# Patient Record
Sex: Female | Born: 1940 | Race: White | Hispanic: No | Smoking: Former smoker
Health system: Southern US, Community
[De-identification: ages and names within clinical notes are randomized; demographics above are authoritative.]

## PROBLEM LIST (undated history)

## (undated) DIAGNOSIS — R011 Cardiac murmur, unspecified: Secondary | ICD-10-CM

## (undated) DIAGNOSIS — I00 Rheumatic fever without heart involvement: Secondary | ICD-10-CM

## (undated) DIAGNOSIS — Z953 Presence of xenogenic heart valve: Secondary | ICD-10-CM

## (undated) DIAGNOSIS — I272 Pulmonary hypertension, unspecified: Secondary | ICD-10-CM

## (undated) DIAGNOSIS — I219 Acute myocardial infarction, unspecified: Secondary | ICD-10-CM

## (undated) DIAGNOSIS — Z8679 Personal history of other diseases of the circulatory system: Secondary | ICD-10-CM

## (undated) DIAGNOSIS — I099 Rheumatic heart disease, unspecified: Secondary | ICD-10-CM

## (undated) DIAGNOSIS — M545 Low back pain, unspecified: Secondary | ICD-10-CM

## (undated) DIAGNOSIS — I050 Rheumatic mitral stenosis: Secondary | ICD-10-CM

## (undated) DIAGNOSIS — I071 Rheumatic tricuspid insufficiency: Secondary | ICD-10-CM

## (undated) DIAGNOSIS — I509 Heart failure, unspecified: Secondary | ICD-10-CM

## (undated) DIAGNOSIS — H3530 Unspecified macular degeneration: Secondary | ICD-10-CM

## (undated) DIAGNOSIS — J449 Chronic obstructive pulmonary disease, unspecified: Secondary | ICD-10-CM

## (undated) DIAGNOSIS — I872 Venous insufficiency (chronic) (peripheral): Secondary | ICD-10-CM

## (undated) DIAGNOSIS — M1990 Unspecified osteoarthritis, unspecified site: Secondary | ICD-10-CM

## (undated) DIAGNOSIS — R4582 Worries: Secondary | ICD-10-CM

## (undated) DIAGNOSIS — I4891 Unspecified atrial fibrillation: Secondary | ICD-10-CM

## (undated) DIAGNOSIS — F419 Anxiety disorder, unspecified: Secondary | ICD-10-CM

## (undated) DIAGNOSIS — Z9889 Other specified postprocedural states: Secondary | ICD-10-CM

## (undated) DIAGNOSIS — I2510 Atherosclerotic heart disease of native coronary artery without angina pectoris: Secondary | ICD-10-CM

## (undated) DIAGNOSIS — I10 Essential (primary) hypertension: Secondary | ICD-10-CM

## (undated) DIAGNOSIS — R600 Localized edema: Secondary | ICD-10-CM

## (undated) DIAGNOSIS — N183 Chronic kidney disease, stage 3 (moderate): Secondary | ICD-10-CM

## (undated) DIAGNOSIS — I50810 Right heart failure, unspecified: Secondary | ICD-10-CM

## (undated) DIAGNOSIS — E785 Hyperlipidemia, unspecified: Secondary | ICD-10-CM

## (undated) DIAGNOSIS — E039 Hypothyroidism, unspecified: Secondary | ICD-10-CM

## (undated) DIAGNOSIS — J45909 Unspecified asthma, uncomplicated: Secondary | ICD-10-CM

## (undated) DIAGNOSIS — I5032 Chronic diastolic (congestive) heart failure: Secondary | ICD-10-CM

## (undated) DIAGNOSIS — I482 Chronic atrial fibrillation, unspecified: Secondary | ICD-10-CM

## (undated) DIAGNOSIS — G8929 Other chronic pain: Secondary | ICD-10-CM

## (undated) HISTORY — DX: Hypothyroidism, unspecified: E03.9

## (undated) HISTORY — DX: Rheumatic tricuspid insufficiency: I07.1

## (undated) HISTORY — DX: Chronic kidney disease, stage 3 (moderate): N18.3

## (undated) HISTORY — DX: Chronic atrial fibrillation, unspecified: I48.20

## (undated) HISTORY — DX: Essential (primary) hypertension: I10

## (undated) HISTORY — PX: CATARACT EXTRACTION W/ INTRAOCULAR LENS  IMPLANT, BILATERAL: SHX1307

## (undated) HISTORY — DX: Chronic diastolic (congestive) heart failure: I50.32

## (undated) HISTORY — DX: Hyperlipidemia, unspecified: E78.5

## (undated) HISTORY — DX: Pulmonary hypertension, unspecified: I27.20

## (undated) HISTORY — DX: Rheumatic fever without heart involvement: I00

## (undated) HISTORY — DX: Unspecified atrial fibrillation: I48.91

## (undated) HISTORY — DX: Rheumatic mitral stenosis: I05.0

## (undated) HISTORY — PX: CARDIAC CATHETERIZATION: SHX172

## (undated) HISTORY — DX: Heart failure, unspecified: I50.9

## (undated) HISTORY — PX: TEE WITH CARDIOVERSION: SHX5442

## (undated) HISTORY — DX: Morbid (severe) obesity due to excess calories: E66.01

## (undated) HISTORY — PX: CARDIOVERSION: SHX1299

## (undated) HISTORY — DX: Rheumatic heart disease, unspecified: I09.9

## (undated) HISTORY — DX: Atherosclerotic heart disease of native coronary artery without angina pectoris: I25.10

## (undated) HISTORY — DX: Localized edema: R60.0

## (undated) HISTORY — DX: Venous insufficiency (chronic) (peripheral): I87.2

---

## 1963-09-29 HISTORY — PX: MULTIPLE TOOTH EXTRACTIONS: SHX2053

## 1982-09-28 HISTORY — PX: TUBAL LIGATION: SHX77

## 2005-05-11 ENCOUNTER — Ambulatory Visit: Payer: Self-pay | Admitting: Family Medicine

## 2005-06-04 ENCOUNTER — Ambulatory Visit: Payer: Self-pay | Admitting: Family Medicine

## 2005-09-03 ENCOUNTER — Ambulatory Visit: Payer: Self-pay | Admitting: Family Medicine

## 2005-09-18 ENCOUNTER — Ambulatory Visit: Payer: Self-pay | Admitting: Family Medicine

## 2005-09-28 DIAGNOSIS — I219 Acute myocardial infarction, unspecified: Secondary | ICD-10-CM

## 2005-09-28 HISTORY — DX: Acute myocardial infarction, unspecified: I21.9

## 2005-11-13 ENCOUNTER — Ambulatory Visit: Payer: Self-pay | Admitting: Family Medicine

## 2005-11-27 ENCOUNTER — Ambulatory Visit: Payer: Self-pay | Admitting: Family Medicine

## 2006-02-09 ENCOUNTER — Ambulatory Visit: Payer: Self-pay | Admitting: Family Medicine

## 2006-03-12 ENCOUNTER — Ambulatory Visit: Payer: Self-pay | Admitting: Family Medicine

## 2006-10-20 ENCOUNTER — Ambulatory Visit: Payer: Self-pay | Admitting: Family Medicine

## 2006-11-05 ENCOUNTER — Ambulatory Visit: Payer: Self-pay | Admitting: Family Medicine

## 2007-03-25 ENCOUNTER — Ambulatory Visit: Payer: Self-pay | Admitting: Family Medicine

## 2011-12-30 ENCOUNTER — Other Ambulatory Visit: Payer: Self-pay | Admitting: Internal Medicine

## 2011-12-31 DIAGNOSIS — I509 Heart failure, unspecified: Secondary | ICD-10-CM

## 2011-12-31 DIAGNOSIS — I059 Rheumatic mitral valve disease, unspecified: Secondary | ICD-10-CM

## 2011-12-31 DIAGNOSIS — I4891 Unspecified atrial fibrillation: Secondary | ICD-10-CM

## 2012-01-01 ENCOUNTER — Telehealth: Payer: Self-pay | Admitting: *Deleted

## 2012-01-01 ENCOUNTER — Encounter: Payer: Self-pay | Admitting: *Deleted

## 2012-01-01 NOTE — Telephone Encounter (Signed)
Left & right heart cath scheduled for Wednesday, April 10 at 11:30 with Dr. Kirke Corin

## 2012-01-04 NOTE — Telephone Encounter (Signed)
Auth # [id number] exp 02/18/12

## 2012-01-05 ENCOUNTER — Other Ambulatory Visit: Payer: Self-pay | Admitting: Physician Assistant

## 2012-01-05 DIAGNOSIS — I050 Rheumatic mitral stenosis: Secondary | ICD-10-CM

## 2012-01-06 ENCOUNTER — Encounter (HOSPITAL_BASED_OUTPATIENT_CLINIC_OR_DEPARTMENT_OTHER)
Admission: RE | Disposition: A | Discharge status: Home / Self Care | Payer: Self-pay | Source: Ambulatory Visit | Attending: Cardiovascular Disease

## 2012-01-06 ENCOUNTER — Inpatient Hospital Stay (HOSPITAL_BASED_OUTPATIENT_CLINIC_OR_DEPARTMENT_OTHER)
Admission: RE | Admit: 2012-01-06 | Discharge: 2012-01-06 | Disposition: A | Discharge status: Home / Self Care | Payer: Medicare Other | Source: Ambulatory Visit | Attending: Cardiovascular Disease | Admitting: Cardiovascular Disease

## 2012-01-06 DIAGNOSIS — I2510 Atherosclerotic heart disease of native coronary artery without angina pectoris: Secondary | ICD-10-CM | POA: Insufficient documentation

## 2012-01-06 DIAGNOSIS — I509 Heart failure, unspecified: Secondary | ICD-10-CM

## 2012-01-06 DIAGNOSIS — I050 Rheumatic mitral stenosis: Secondary | ICD-10-CM

## 2012-01-06 DIAGNOSIS — I359 Nonrheumatic aortic valve disorder, unspecified: Secondary | ICD-10-CM | POA: Insufficient documentation

## 2012-01-06 DIAGNOSIS — I4891 Unspecified atrial fibrillation: Secondary | ICD-10-CM | POA: Insufficient documentation

## 2012-01-06 DIAGNOSIS — I2789 Other specified pulmonary heart diseases: Secondary | ICD-10-CM | POA: Insufficient documentation

## 2012-01-06 SURGERY — JV LEFT AND RIGHT HEART CATHETERIZATION WITH CORONARY/GRAFT ANGIOGRAM
Anesthesia: Moderate Sedation

## 2012-01-06 MED ORDER — DIAZEPAM 5 MG PO TABS
5.0000 mg | ORAL_TABLET | ORAL | Status: AC
  Administered 2012-01-06: 5 mg via ORAL

## 2012-01-06 MED ORDER — SODIUM CHLORIDE 0.9 % IV SOLN
250.0000 mL | INTRAVENOUS | Status: DC | PRN
Start: 2012-01-06 — End: 2012-01-06

## 2012-01-06 MED ORDER — ONDANSETRON HCL 4 MG/2ML IJ SOLN: 4.0000 mg | Freq: Four times a day (QID) | INTRAMUSCULAR | Status: DC | PRN

## 2012-01-06 MED ORDER — SODIUM CHLORIDE 0.9 % IV SOLN: INTRAVENOUS | Status: DC

## 2012-01-06 MED ORDER — ACETAMINOPHEN 325 MG PO TABS: 650.0000 mg | ORAL_TABLET | ORAL | Status: DC | PRN

## 2012-01-06 MED ORDER — SODIUM CHLORIDE 0.9 % IJ SOLN: 3.0000 mL | INTRAMUSCULAR | Status: DC | PRN

## 2012-01-06 MED ORDER — ASPIRIN 81 MG PO CHEW
324.0000 mg | CHEWABLE_TABLET | ORAL | Status: AC
  Administered 2012-01-06: 324 mg via ORAL

## 2012-01-06 MED ORDER — SODIUM CHLORIDE 0.9 % IJ SOLN: 3.0000 mL | Freq: Two times a day (BID) | INTRAMUSCULAR | Status: DC

## 2012-01-06 NOTE — OR Nursing (Signed)
Discharge instructions reviewed and signed, pt stated understanding, ambulated in hall without difficulty, site level 0, transported to son's truck via wheelchair

## 2012-01-06 NOTE — OR Nursing (Signed)
Tegaderm dressing applied, site level 0, bedrest begins at 1320 

## 2012-01-06 NOTE — H&P (Signed)
  This patient is referred by [name] after he saw her for an inpatient consult at [facility]. There is a full consultation note dated 12/30/2011. I reviewed this in detail. I have also reviewed the patient's noninvasive testing. 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 She was referred for right and left heart cath for definitive evaluation. I have no additions to make to his full consultation note.  Tonny Bollman 01/06/2012 12:49 PM

## 2012-01-06 NOTE — OR Nursing (Signed)
Dr Cooper at bedside to discuss results and treatment plan with pt and family 

## 2012-01-07 LAB — POCT I-STAT 3, ART BLOOD GAS (G3+)
Acid-Base Excess: 2 mmol/L (ref 0.0–2.0)
Bicarbonate: 27.6 mEq/L — ABNORMAL HIGH (ref 20.0–24.0)
O2 Saturation: 98 %
TCO2: 29 mmol/L (ref 0–100)
pO2, Arterial: 103 mmHg — ABNORMAL HIGH (ref 80.0–100.0)

## 2012-01-07 LAB — POCT I-STAT 3, VENOUS BLOOD GAS (G3P V)
Bicarbonate: 25.9 mEq/L — ABNORMAL HIGH (ref 20.0–24.0)
pCO2, Ven: 49.9 mmHg (ref 45.0–50.0)
pCO2, Ven: 45.2 mmHg (ref 45.0–50.0)
pH, Ven: 7.365 — ABNORMAL HIGH (ref 7.250–7.300)
pH, Ven: 7.365 — ABNORMAL HIGH (ref 7.250–7.300)
pO2, Ven: 39 mmHg (ref 30.0–45.0)

## 2012-01-07 NOTE — Op Note (Signed)
   Cardiac Catheterization Procedure Note  Name: Ann Potter MRN: [id number] DOB: Sep 25, 1941  Procedure: Right Heart Cath, Left Heart Cath, Selective Coronary Angiography  Indication: CHF, atrial fibrillation, mitral valve stenosis   Procedural Details: The right groin was prepped, draped, and anesthetized with 1% lidocaine. Using the modified Seldinger technique a 4 French sheath was placed in the right femoral artery and a 6 French sheath was placed in the right femoral vein. A multipurpose catheter was used for the right heart catheterization. Standard protocol was followed for recording of right heart pressures and sampling of oxygen saturations. Fick cardiac output was calculated. Standard Judkins catheters were used for selective coronary angiography and left ventriculography. There were no immediate procedural complications. The patient was transferred to the post catheterization recovery area for further monitoring.  Procedural Findings: Hemodynamics RA 14 RV 48/13 PA 50/18 mean 33 PCWP v wave 29, mean 18 LV 119/19 AO 124/62, mean 88  Oxygen saturations: PA 67 AO 98 SVC 71  Cardiac Output (Fick) 5.2  Cardiac Index (Fick) 2.5   Coronary angiography: Coronary dominance: right  Left mainstem: Mildly calcified, no obstructive disease  Left anterior descending (LAD): Reaches the LV apex, moderate caliber vessel without significant obstructive disease  Left circumflex (LCx): Moderate caliber vessel without significant disease  Right coronary artery (RCA): Large, dominant vessel. Gives off PDA branch. Mild irregularity but no significant obstructive disease  Left ventriculography: Deferred because of reduced creatinine clearance.  Final Conclusions:   1. Minor nonobstructive CAD 2. Moderate pulmonary HTN suspect primarily due to left heart disease (transpulmonic gradient , PVR 2.7) 3. Mild mitral stenosis   Recommendations: Careful hemodynamic study was done  with simultaneous LV/PCWP tracing and I don't appreciate typical features of hemodynamically significant mitral stenosis. There was a large V wave in the wedge tracing which could be suggestive of a noncompliant LV or mitral regurgitation. I will review with Dr Andee Lineman.  Tonny Bollman 01/07/2012, 12:30 AM

## 2012-01-10 ENCOUNTER — Telehealth: Payer: Self-pay | Admitting: Physician Assistant

## 2012-01-10 NOTE — Telephone Encounter (Signed)
Patient recently admitted to [facility].  I can not locate any records.  Looks like she has normal EF by TEE and there is a question of significant mitral stenosis.  L and R HC done last week with non obs CAD and no evidence of significant mitral stenosis.  It sounds as though her admission to [facility] was due to diastolic CHF.  Daughter called in today with complaints of increased weight (8lbs) and increased LE edema.  Baseline dyspnea and chest pain unchanged.  She sleeps on several pillows without change.  No syncope.  Has occ palps.  I believe she has AFib.  She is on HCTZ but no Lasix.  I advised her to take an extra HCTZ today.  Will have the office in [place] bring her in for an appt tomorrow to evaluate and decide if she should be on lasix or not.    Please call patient for appt at [phone]. Tereso Newcomer, PA-C  1:47 PM 01/10/2012

## 2012-01-11 ENCOUNTER — Other Ambulatory Visit: Payer: Self-pay | Admitting: *Deleted

## 2012-01-11 ENCOUNTER — Encounter: Payer: Self-pay | Admitting: *Deleted

## 2012-01-11 DIAGNOSIS — R635 Abnormal weight gain: Secondary | ICD-10-CM

## 2012-01-11 DIAGNOSIS — R609 Edema, unspecified: Secondary | ICD-10-CM

## 2012-01-11 MED ORDER — FUROSEMIDE 40 MG PO TABS: 40.0000 mg | ORAL_TABLET | Freq: Every day | ORAL | Status: DC

## 2012-01-11 MED ORDER — POTASSIUM CHLORIDE CRYS ER 20 MEQ PO TBCR: 20.0000 meq | EXTENDED_RELEASE_TABLET | Freq: Every day | ORAL | Status: DC

## 2012-01-11 NOTE — Progress Notes (Addendum)
Patient ID: Ann Potter, female   DOB: 06/30/1941, 71 y.o.   MRN: [id number]  Patient daughter notified of below.  New rx sent to [facility] & order faxed to [facility].

## 2012-01-11 NOTE — Progress Notes (Addendum)
Patient ID: Ann Potter, female   DOB: 10-16-1940, 71 y.o.   MRN: [id number]  Arlyss Gandy, RN       Sent: Mon January 11, 2012  8:58 AM     Message     Per Gene---call pt to see how she is doing and then route message to him. He thinks 4/29 appt pt already has is good.      Attached Reports     The sender attached the following reports to this message:    Encounter report for 01/10/2012  ========================================================================================== Reason for Call     Advice Only edema      Call Documentation     Tereso Newcomer, [place]  01/10/2012  1:47 PM  Signed [facility]  I can not locate any records.  Looks like she has normal EF by TEE and there is a question of significant mitral stenosis.  L and R HC done last week with non obs CAD and no evidence of significant mitral stenosis.  It sounds as though her admission to [facility] was due to diastolic CHF.  Daughter called in today with complaints of increased weight (8lbs) and increased LE edema.  Baseline dyspnea and chest pain unchanged.  She sleeps on several pillows without change.  No syncope.  Has occ palps.  I believe she has AFib.  She is on HCTZ but no Lasix.  I advised her to take an extra HCTZ today.  Will have the office in [place] bring her in for an appt tomorrow to evaluate and decide if she should be on lasix or not.     Please call patient for appt at [phone]. Tereso Newcomer, PA-C  1:47 PM 01/10/2012                       Contacts         Type Contact Phone    01/10/2012  1:43 PM Phone (Incoming) [place] (Emergency Contact) [phone] (M)      Routing History        From To Priority    01/10/2012  1:47 PM Beatrice Lecher, PA June Leap, MD Arlyss Gandy, RN Rande Brunt, PA Routine     Created by     Beatrice Lecher, PA on 01/10/2012  1:43 PM       ========================================================================================  Call placed to daughter Boyd Kerbs).   States her weight this morning was 260.  Did take the extra dose of HCTZ, but has not helped much.  Does have SOB, but not anything outside of the norm for her.    Recommend the following: D/C HCTZ, and start Lasix 40 daily/KDUR 20 daily. BMET now and in 1 week. Pt to call with weight in 3 days, or if sxs worsen.  Gene Serpe, PAC

## 2012-01-12 ENCOUNTER — Encounter: Payer: Self-pay | Admitting: *Deleted

## 2012-01-15 ENCOUNTER — Telehealth: Payer: Self-pay | Admitting: *Deleted

## 2012-01-15 NOTE — Telephone Encounter (Signed)
Daughter Boyd Kerbs) calling to give update on mom.  States weight this morning was 259.  States breathing is about the same, but notice that weight fluctuates by the amount of activity she does.  Has OV scheduled for 4/29.

## 2012-01-18 NOTE — Telephone Encounter (Signed)
Noted  

## 2012-01-18 NOTE — Telephone Encounter (Signed)
Thanks. Dr Degent to reassess at OV.

## 2012-01-20 ENCOUNTER — Encounter: Payer: Self-pay | Admitting: *Deleted

## 2012-01-25 ENCOUNTER — Encounter: Payer: Self-pay | Admitting: *Deleted

## 2012-01-25 ENCOUNTER — Encounter: Payer: Self-pay | Admitting: Physician Assistant

## 2012-01-25 ENCOUNTER — Ambulatory Visit (INDEPENDENT_AMBULATORY_CARE_PROVIDER_SITE_OTHER): Payer: Medicare Other | Admitting: Physician Assistant

## 2012-01-25 VITALS — BP 126/65 | HR 69 | Ht 62.0 in | Wt 265.0 lb

## 2012-01-25 DIAGNOSIS — I482 Chronic atrial fibrillation, unspecified: Secondary | ICD-10-CM

## 2012-01-25 DIAGNOSIS — I5032 Chronic diastolic (congestive) heart failure: Secondary | ICD-10-CM

## 2012-01-25 DIAGNOSIS — I272 Pulmonary hypertension, unspecified: Secondary | ICD-10-CM | POA: Insufficient documentation

## 2012-01-25 DIAGNOSIS — I5030 Unspecified diastolic (congestive) heart failure: Secondary | ICD-10-CM

## 2012-01-25 DIAGNOSIS — I10 Essential (primary) hypertension: Secondary | ICD-10-CM | POA: Insufficient documentation

## 2012-01-25 DIAGNOSIS — R600 Localized edema: Secondary | ICD-10-CM

## 2012-01-25 DIAGNOSIS — R609 Edema, unspecified: Secondary | ICD-10-CM

## 2012-01-25 DIAGNOSIS — I480 Paroxysmal atrial fibrillation: Secondary | ICD-10-CM

## 2012-01-25 DIAGNOSIS — N183 Chronic kidney disease, stage 3 unspecified: Secondary | ICD-10-CM

## 2012-01-25 DIAGNOSIS — I050 Rheumatic mitral stenosis: Secondary | ICD-10-CM

## 2012-01-25 DIAGNOSIS — I2510 Atherosclerotic heart disease of native coronary artery without angina pectoris: Secondary | ICD-10-CM

## 2012-01-25 DIAGNOSIS — I2789 Other specified pulmonary heart diseases: Secondary | ICD-10-CM

## 2012-01-25 DIAGNOSIS — I4891 Unspecified atrial fibrillation: Secondary | ICD-10-CM

## 2012-01-25 DIAGNOSIS — I5033 Acute on chronic diastolic (congestive) heart failure: Secondary | ICD-10-CM | POA: Insufficient documentation

## 2012-01-25 DIAGNOSIS — Z7901 Long term (current) use of anticoagulants: Secondary | ICD-10-CM

## 2012-01-25 HISTORY — DX: Chronic diastolic (congestive) heart failure: I50.32

## 2012-01-25 HISTORY — DX: Chronic atrial fibrillation, unspecified: I48.20

## 2012-01-25 HISTORY — DX: Chronic kidney disease, stage 3 unspecified: N18.30

## 2012-01-25 MED ORDER — WARFARIN SODIUM 5 MG PO TABS: 5.0000 mg | ORAL_TABLET | Freq: Every day | ORAL | Status: DC

## 2012-01-25 NOTE — Assessment & Plan Note (Signed)
Mild by recent catheterization. Continue monitoring clinically.

## 2012-01-25 NOTE — Assessment & Plan Note (Signed)
Moderate by recent R heart catheterization

## 2012-01-25 NOTE — Progress Notes (Signed)
HPI: Patient presents for post hospital followup, initially seen here at [facility], 12/31/11, for acute diastolic CHF and new-onset PAF CVR. Initial plan was to initiate Coumadin and proceed with outpatient DCCV in 4 weeks. 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 This was followed by a TEE, again reviewed by [name], suggestive of mild/moderate MS. Given this, he recommended further evaluation as an outpatient with R/L cardiac catheterization.  This was performed by Dr. Excell Seltzer, on April 11th, which revealed:  Final Conclusions:  1. Minor nonobstructive CAD  2. Moderate pulmonary HTN suspect primarily due to left heart disease (transpulmonic gradient , PVR 2.7)  3. Mild mitral stenosis  Recommendations: Careful hemodynamic study was done with simultaneous LV/PCWP tracing and I don't appreciate typical features of hemodynamically significant mitral stenosis. There was a large V wave in the wedge tracing which could be suggestive of a noncompliant LV or mitral regurgitation. I will review with Dr Andee Lineman.   Clinically, she reports overall improvement in her dyspnea, since recent hospitalization. She does have occasional palpitations. She denies PND orthopnea, but does have marked chronic LE edema.  Recent post hospital labs: BUN 31, creatinine 1.4 (c/w prior 1.5), and potassium 3.8.  EKG in office today indicates atrial fibrillation at 57 bpm; incomplete RBBB   No Known Allergies  Current Outpatient Prescriptions  Medication Sig Dispense Refill  . acetaminophen-codeine (TYLENOL #2) 300-15 MG per tablet Take 1 tablet by mouth every 4 (four) hours as needed.      Marland Kitchen albuterol (PROVENTIL) (2.5 MG/3ML) 0.083% nebulizer solution Take 2.5 mg by nebulization every 6 (six) hours as needed.      Marland Kitchen aspirin 325 MG tablet Take 325 mg by mouth daily.      . Fluticasone-Salmeterol (ADVAIR DISKUS) 250-50 MCG/DOSE  AEPB Inhale 1 puff into the lungs every 12 (twelve) hours.      . furosemide (LASIX) 40 MG tablet Take 1 tablet (40 mg total) by mouth daily.  30 tablet  6  . levothyroxine (SYNTHROID, LEVOTHROID) 50 MCG tablet Take 50 mcg by mouth daily.      Marland Kitchen loratadine (CLARITIN) 10 MG tablet Take 10 mg by mouth daily.      . metoprolol (LOPRESSOR) 50 MG tablet Take 50 mg by mouth 2 (two) times daily.      . montelukast (SINGULAIR) 10 MG tablet Take 10 mg by mouth at bedtime.      . potassium chloride SA (K-DUR,KLOR-CON) 20 MEQ tablet Take 1 tablet (20 mEq total) by mouth daily.  30 tablet  6  . rosuvastatin (CRESTOR) 10 MG tablet Take 10 mg by mouth daily.        Past Medical History  Diagnosis Date  . Atrial fibrillation   . CHF (congestive heart failure)   . Lower extremity edema   . Mitral stenosis     Mild, by catheterization 4/13  . Pulmonary hypertension     Moderate, by catheterization 4/13  . CAD (coronary artery disease)     Nonobstructive, by catheterization 4/13  . Hypothyroidism   . HTN (hypertension)   . Morbid obesity   . HLD (hyperlipidemia)     No past surgical history on file.  History   Social History  . Marital Status: Single    Spouse Name: N/A    Number of Children: N/A  . Years of Education: N/A   Occupational History  . Not on  file.   Social History Main Topics  . Smoking status: Former Smoker    Quit date: 09/28/1981  . Smokeless tobacco: Not on file  . Alcohol Use: Not on file  . Drug Use: Not on file  . Sexually Active: Not on file   Other Topics Concern  . Not on file   Social History Narrative   Has 2 grown childrenLives alone    Family History  Problem Relation Age of Onset  . Heart disease Father 80  . Deep vein thrombosis Father   . Cancer Mother     right breast   . Cancer Sister     brain cancer  . Stomach cancer Brother     ROS: no nausea, vomiting; no fever, chills; no melena, hematochezia; no claudication  PHYSICAL EXAM: BP  126/65  Pulse 69  Ht 5\' 2"  (1.575 m)  Wt 265 lb (120.203 kg)  BMI 48.47 kg/m2  SpO2 95% GENERAL: 71 year old female, obese, sitting in wheelchair; NAD HEENT: NCAT, PERRLA, EOMI; sclera clear; no xanthelasma NECK: palpable bilateral carotid pulses, no bruits; unable to assess JVD, secondary to neck girth LUNGS: CTA bilaterally CARDIAC: Irregularly irregular (S1, S2); soft grade 2-3/6 short systolic ejection murmur at base ABDOMEN: soft, non-tender; intact BS EXTREMETIES: Chronic, 4+ bilateral nonpitting peripheral edema SKIN: warm/dry; no obvious rash/lesions MUSCULOSKELETAL: no joint deformity NEURO: no focal deficit; NL affect   EKG: reviewed and available in Electronic Records   ASSESSMENT & PLAN:

## 2012-01-25 NOTE — Patient Instructions (Addendum)
Follow up in 1 month. Start Coumadin (warfarin) 5 mg daily. Follow up in Coumadin Clinic. Stop Aspirin when your INR (Coumadin level) reaches 2.0.

## 2012-01-25 NOTE — Assessment & Plan Note (Signed)
Stable by recent followup labs with creatinine 1.4 (GFR 36)

## 2012-01-25 NOTE — Assessment & Plan Note (Signed)
Patient remains in AF by EKG today. She has agreed with our initial plan for Coumadin anticoagulation with DCCV in 4 weeks. We will start 5 mg daily and establish her in our Coumadin clinic tomorrow, with [name]. Will arrange for return visit in one month and, if she remains in AF, arrange outpatient DCCV  with [name]. We'll continue current dose of beta blocker for rate control. Patient can come off ASA, once she attains therapeutic INR levels.

## 2012-01-25 NOTE — Assessment & Plan Note (Signed)
Nonobstructive by recent catheterization 

## 2012-01-25 NOTE — Assessment & Plan Note (Signed)
Well-controlled on current medication regimen 

## 2012-01-26 NOTE — Progress Notes (Signed)
Addended by: Arlyss Gandy on: 01/26/2012 09:41 AM   Modules accepted: Orders

## 2012-01-29 ENCOUNTER — Ambulatory Visit (INDEPENDENT_AMBULATORY_CARE_PROVIDER_SITE_OTHER): Payer: Medicare Other | Admitting: *Deleted

## 2012-01-29 DIAGNOSIS — Z7901 Long term (current) use of anticoagulants: Secondary | ICD-10-CM

## 2012-01-29 DIAGNOSIS — I480 Paroxysmal atrial fibrillation: Secondary | ICD-10-CM

## 2012-01-29 DIAGNOSIS — I4891 Unspecified atrial fibrillation: Secondary | ICD-10-CM

## 2012-02-02 ENCOUNTER — Telehealth: Payer: Self-pay | Admitting: *Deleted

## 2012-02-02 LAB — POCT INR: INR: 2.3

## 2012-02-02 NOTE — Telephone Encounter (Signed)
INR 2.3, PT IS ON 7.5MG  DAILY.

## 2012-02-03 ENCOUNTER — Ambulatory Visit: Payer: Self-pay | Admitting: *Deleted

## 2012-02-03 DIAGNOSIS — I480 Paroxysmal atrial fibrillation: Secondary | ICD-10-CM

## 2012-02-03 DIAGNOSIS — Z7901 Long term (current) use of anticoagulants: Secondary | ICD-10-CM

## 2012-02-08 ENCOUNTER — Encounter: Payer: Self-pay | Admitting: *Deleted

## 2012-02-08 DIAGNOSIS — I480 Paroxysmal atrial fibrillation: Secondary | ICD-10-CM

## 2012-02-08 DIAGNOSIS — Z7901 Long term (current) use of anticoagulants: Secondary | ICD-10-CM

## 2012-02-08 NOTE — Telephone Encounter (Signed)
See coumadin note. 

## 2012-02-08 NOTE — Progress Notes (Signed)
This encounter was created in error - please disregard.

## 2012-02-15 ENCOUNTER — Ambulatory Visit (INDEPENDENT_AMBULATORY_CARE_PROVIDER_SITE_OTHER): Payer: Medicare Other | Admitting: Physician Assistant

## 2012-02-15 ENCOUNTER — Encounter: Payer: Self-pay | Admitting: Physician Assistant

## 2012-02-15 VITALS — BP 102/66 | HR 64 | Ht 62.0 in | Wt 264.0 lb

## 2012-02-15 DIAGNOSIS — I10 Essential (primary) hypertension: Secondary | ICD-10-CM

## 2012-02-15 DIAGNOSIS — N289 Disorder of kidney and ureter, unspecified: Secondary | ICD-10-CM

## 2012-02-15 DIAGNOSIS — R600 Localized edema: Secondary | ICD-10-CM

## 2012-02-15 DIAGNOSIS — R609 Edema, unspecified: Secondary | ICD-10-CM

## 2012-02-15 DIAGNOSIS — I4891 Unspecified atrial fibrillation: Secondary | ICD-10-CM

## 2012-02-15 LAB — PROTIME-INR: INR: 4.2 — AB (ref 0.9–1.1)

## 2012-02-15 MED ORDER — FUROSEMIDE 40 MG PO TABS: 60.0000 mg | ORAL_TABLET | Freq: Every morning | ORAL | Status: DC

## 2012-02-15 NOTE — Assessment & Plan Note (Signed)
Patient remains in AF with CVR. Patient recently started on Coumadin, with plans for DCCV after 4 weeks. She has been therapeutic since 5/7. Will repeat INR today, and plan for DCCV with [name], after 4 full weeks of therapeutic anticoagulation. Patient did come off ASA, following INR greater than 2.0. Continue current dose of Lopressor.

## 2012-02-15 NOTE — Patient Instructions (Addendum)
   Cardioversion - pending (after 4 weeks therapeutic on coumadin).  Increase Lasix to 60mg  every morning Your physician recommends that you go to the [facility] lab for blood work TODAY for BMET, BNP, & PT/INR.   If the results of your test are normal or stable, you will receive a letter.  If they are abnormal, the nurse will contact you by phone. You will also need repeat BMET in one week. Support stockings - see order given

## 2012-02-15 NOTE — Assessment & Plan Note (Signed)
Very well controlled on current regimen

## 2012-02-15 NOTE — Progress Notes (Signed)
HPI: Patient presents for scheduled followup, with plans to proceed with elective DCCV if she remained in AF. When last seen, we initiated Coumadin anticoagulation and assigned her to our Coumadin clinic. She had an INR of 2.3 on May 7, and a level of 3.9 on May 13. EKG today indicates persistent AF at approximately 60 bpm.  Clinically, she has occasional palpitations. Regarding her SOB, she still feels much better since her recent hospitalization with acute DHF. However, she has noted recent reexacerbation of her LE edema. A recent 2-D echo yielded normal RVF. She denies PND, has to sleep with her head elevated. She has occasional lightheadedness/dizziness, but no frank syncope or falls.   No Known Allergies  Current Outpatient Prescriptions  Medication Sig Dispense Refill  . acetaminophen-codeine (TYLENOL #2) 300-15 MG per tablet Take 1 tablet by mouth every 4 (four) hours as needed.      Marland Kitchen albuterol (PROVENTIL) (2.5 MG/3ML) 0.083% nebulizer solution Take 2.5 mg by nebulization every 6 (six) hours as needed.      Marland Kitchen alendronate (FOSAMAX) 70 MG tablet Take 1 tablet by mouth Once a week.      . Fluticasone-Salmeterol (ADVAIR DISKUS) 250-50 MCG/DOSE AEPB Inhale 1 puff into the lungs every 12 (twelve) hours.      . furosemide (LASIX) 40 MG tablet Take 1 tablet (40 mg total) by mouth daily.  30 tablet  6  . levothyroxine (SYNTHROID, LEVOTHROID) 50 MCG tablet Take 50 mcg by mouth daily.      Marland Kitchen loratadine (CLARITIN) 10 MG tablet Take 10 mg by mouth daily.      . metoprolol (LOPRESSOR) 50 MG tablet Take 50 mg by mouth 2 (two) times daily.      . montelukast (SINGULAIR) 10 MG tablet Take 10 mg by mouth at bedtime.      . nabumetone (RELAFEN) 500 MG tablet Take 1 tablet by mouth Twice daily as needed.      . potassium chloride SA (K-DUR,KLOR-CON) 20 MEQ tablet Take 1 tablet (20 mEq total) by mouth daily.  30 tablet  6  . rosuvastatin (CRESTOR) 10 MG tablet Take 10 mg by mouth daily.      Marland Kitchen warfarin  (COUMADIN) 5 MG tablet Take 1 tablet (5 mg total) by mouth daily.  30 tablet  11    Past Medical History  Diagnosis Date  . Atrial fibrillation   . CHF (congestive heart failure)   . Lower extremity edema   . Mitral stenosis     Mild, by catheterization 4/13  . Pulmonary hypertension     Moderate, by catheterization 4/13  . CAD (coronary artery disease)     Nonobstructive, by catheterization 4/13  . Hypothyroidism   . HTN (hypertension)   . Morbid obesity   . HLD (hyperlipidemia)     No past surgical history on file.  History   Social History  . Marital Status: Single    Spouse Name: N/A    Number of Children: N/A  . Years of Education: N/A   Occupational History  . Not on file.   Social History Main Topics  . Smoking status: Former Smoker -- 1.0 packs/day for 8 years    Types: Cigarettes    Quit date: 09/28/1981  . Smokeless tobacco: Never Used  . Alcohol Use: Not on file  . Drug Use: Not on file  . Sexually Active: Not on file   Other Topics Concern  . Not on file   Social History Narrative  Has 2 grown childrenLives alone    Family History  Problem Relation Age of Onset  . Heart disease Father 4  . Deep vein thrombosis Father   . Cancer Mother     right breast   . Cancer Sister     brain cancer  . Stomach cancer Brother     ROS: no nausea, vomiting; no fever, chills; no melena, hematochezia; no claudication  PHYSICAL EXAM: BP 102/66  Pulse 64  Ht 5\' 2"  (1.575 m)  Wt 264 lb (119.75 kg)  BMI 48.29 kg/m2 GENERAL: 71 year old female, obese, sitting in wheelchair; NAD  HEENT: NCAT, PERRLA, EOMI; sclera clear; no xanthelasma  NECK: palpable bilateral carotid pulses, no bruits; unable to assess JVD, secondary to neck girth  LUNGS: CTA bilaterally  CARDIAC: Irregularly irregular (S1, S2); soft grade 2-3/6 short systolic ejection murmur at base  ABDOMEN: soft, non-tender; intact BS  EXTREMETIES: Chronic, 4+ bilateral nonpitting peripheral edema    SKIN: warm/dry; no obvious rash/lesions  MUSCULOSKELETAL: no joint deformity  NEURO: no focal deficit; NL affect    EKG: reviewed and available in Electronic Records   ASSESSMENT & PLAN:  No problem-specific assessment & plan notes found for this encounter.   Gene Makinzie Considine, PAC

## 2012-02-15 NOTE — Assessment & Plan Note (Signed)
Patient reports reaccumulation of LE edema over the past week. Of note, however, she has actually lost 1 pound. She refrains from added salt in her diet, and weighs herself daily. Will carefully increase Lasix to 60 mg daily, with baseline labs today, including BNP level, and repeat BMET in one week. Will also provide for below knee support hose. Recent labs notable for albumin 3.5.

## 2012-02-15 NOTE — Assessment & Plan Note (Signed)
Patient has recently documented mild renal sufficiency. We'll continue to monitor closely with followup labs today.

## 2012-02-16 ENCOUNTER — Ambulatory Visit: Payer: Self-pay | Admitting: *Deleted

## 2012-02-16 DIAGNOSIS — Z7901 Long term (current) use of anticoagulants: Secondary | ICD-10-CM

## 2012-02-16 DIAGNOSIS — I480 Paroxysmal atrial fibrillation: Secondary | ICD-10-CM

## 2012-02-23 ENCOUNTER — Ambulatory Visit: Payer: Self-pay | Admitting: *Deleted

## 2012-02-23 DIAGNOSIS — Z7901 Long term (current) use of anticoagulants: Secondary | ICD-10-CM

## 2012-02-23 DIAGNOSIS — I480 Paroxysmal atrial fibrillation: Secondary | ICD-10-CM

## 2012-02-23 LAB — POCT INR: INR: 3.2

## 2012-03-02 ENCOUNTER — Ambulatory Visit (INDEPENDENT_AMBULATORY_CARE_PROVIDER_SITE_OTHER): Payer: Medicare Other | Admitting: *Deleted

## 2012-03-02 DIAGNOSIS — I4891 Unspecified atrial fibrillation: Secondary | ICD-10-CM

## 2012-03-02 DIAGNOSIS — Z7901 Long term (current) use of anticoagulants: Secondary | ICD-10-CM

## 2012-03-02 DIAGNOSIS — I480 Paroxysmal atrial fibrillation: Secondary | ICD-10-CM

## 2012-03-11 ENCOUNTER — Telehealth: Payer: Self-pay | Admitting: *Deleted

## 2012-03-11 ENCOUNTER — Ambulatory Visit (INDEPENDENT_AMBULATORY_CARE_PROVIDER_SITE_OTHER): Payer: Medicare Other | Admitting: *Deleted

## 2012-03-11 DIAGNOSIS — I4891 Unspecified atrial fibrillation: Secondary | ICD-10-CM

## 2012-03-11 DIAGNOSIS — Z7901 Long term (current) use of anticoagulants: Secondary | ICD-10-CM

## 2012-03-11 DIAGNOSIS — I480 Paroxysmal atrial fibrillation: Secondary | ICD-10-CM

## 2012-03-11 NOTE — Telephone Encounter (Signed)
Patient informed. 

## 2012-03-11 NOTE — Telephone Encounter (Signed)
Message copied by Eustace Moore on Fri Mar 11, 2012  9:52 AM ------      Message from: Rande Brunt      Created: Tue Feb 16, 2012 12:15 PM       Labs stable. Will review scheduled repeat labs in 1 week.

## 2012-03-11 NOTE — Telephone Encounter (Signed)
Message copied by Eustace Moore on Fri Mar 11, 2012  9:51 AM ------      Message from: Rande Brunt      Created: Mon Feb 29, 2012  3:47 PM       Labs ok

## 2012-03-15 ENCOUNTER — Telehealth: Payer: Self-pay | Admitting: Cardiology

## 2012-03-15 NOTE — Telephone Encounter (Signed)
Left message to return call 

## 2012-03-15 NOTE — Telephone Encounter (Signed)
PT DAUGHTER CALLING TO SET UP CARDIAC CONVERSION

## 2012-03-16 ENCOUNTER — Other Ambulatory Visit: Payer: Self-pay | Admitting: *Deleted

## 2012-03-16 ENCOUNTER — Telehealth: Payer: Self-pay

## 2012-03-16 ENCOUNTER — Encounter: Payer: Self-pay | Admitting: *Deleted

## 2012-03-16 DIAGNOSIS — Z01810 Encounter for preprocedural cardiovascular examination: Secondary | ICD-10-CM

## 2012-03-16 DIAGNOSIS — I4891 Unspecified atrial fibrillation: Secondary | ICD-10-CM

## 2012-03-16 NOTE — Telephone Encounter (Signed)
DCCV scheduled for tomorrow morning at 8:00

## 2012-03-16 NOTE — Telephone Encounter (Signed)
Daughter Ann Potter) notified that Cardioversion scheduled for tomorrow, 6/20 at 8:00 am.  Will need to arrive to day hospital at 6:15 am for same day work.  Instructions given with reminder to be npo after midnight prior to this procedure.  Daughter verbalized understanding of instructions.

## 2012-03-16 NOTE — Telephone Encounter (Signed)
No precert required 

## 2012-03-17 ENCOUNTER — Encounter: Payer: Self-pay | Admitting: Cardiology

## 2012-03-17 DIAGNOSIS — I4891 Unspecified atrial fibrillation: Secondary | ICD-10-CM

## 2012-03-18 ENCOUNTER — Ambulatory Visit (INDEPENDENT_AMBULATORY_CARE_PROVIDER_SITE_OTHER): Payer: Medicare Other | Admitting: *Deleted

## 2012-03-18 VITALS — BP 129/77 | HR 63 | Resp 18 | Ht 62.0 in | Wt 264.0 lb

## 2012-03-18 DIAGNOSIS — Z7901 Long term (current) use of anticoagulants: Secondary | ICD-10-CM

## 2012-03-18 DIAGNOSIS — I4891 Unspecified atrial fibrillation: Secondary | ICD-10-CM

## 2012-03-18 DIAGNOSIS — I480 Paroxysmal atrial fibrillation: Secondary | ICD-10-CM

## 2012-03-25 ENCOUNTER — Ambulatory Visit (INDEPENDENT_AMBULATORY_CARE_PROVIDER_SITE_OTHER): Payer: Medicare Other | Admitting: *Deleted

## 2012-03-25 DIAGNOSIS — I480 Paroxysmal atrial fibrillation: Secondary | ICD-10-CM

## 2012-03-25 DIAGNOSIS — I4891 Unspecified atrial fibrillation: Secondary | ICD-10-CM

## 2012-03-25 DIAGNOSIS — Z7901 Long term (current) use of anticoagulants: Secondary | ICD-10-CM

## 2012-03-30 ENCOUNTER — Other Ambulatory Visit: Payer: Self-pay | Admitting: *Deleted

## 2012-03-30 ENCOUNTER — Telehealth: Payer: Self-pay | Admitting: *Deleted

## 2012-03-30 NOTE — Telephone Encounter (Signed)
Louanna Raw, RN       Sent: Caleen Essex March 25, 2012  4:28 PM    To: Lesle Chris, LPN      Zeenat Jeanbaptiste    MRN: [id number] DOB: [birth year]    Pt Home: [phone]          Message     Pt in today for INR check.  States BP's are running 160 -  170 range systolic on checks at home.  States Dr Andee Lineman stopped metoprolol after DCCV.  Thinks she needs to be back on BP med.  Please call.

## 2012-03-30 NOTE — Telephone Encounter (Signed)
Discussed below with patient.  States her BP has been running 160-180's/90's & HR running around 60-70's.  Stated her Metoprolol was stopped on day of her DCCV (6/20).  Question if she needs to restart.  Does have OV scheduled 7/12.

## 2012-03-30 NOTE — Telephone Encounter (Signed)
Message copied by Lesle Chris on Wed Mar 30, 2012  3:11 PM ------      Message from: Learta Codding      Created: Sun Mar 27, 2012  1:34 PM       Remains in [place]

## 2012-03-30 NOTE — Telephone Encounter (Signed)
Notes Recorded by Lesle Chris, LPN on 10/03/1094 at 3:11 PM Patient notified and verbalized understanding.

## 2012-04-01 ENCOUNTER — Ambulatory Visit (INDEPENDENT_AMBULATORY_CARE_PROVIDER_SITE_OTHER): Payer: Medicare Other | Admitting: *Deleted

## 2012-04-01 DIAGNOSIS — Z7901 Long term (current) use of anticoagulants: Secondary | ICD-10-CM

## 2012-04-01 DIAGNOSIS — I480 Paroxysmal atrial fibrillation: Secondary | ICD-10-CM

## 2012-04-01 DIAGNOSIS — I4891 Unspecified atrial fibrillation: Secondary | ICD-10-CM

## 2012-04-01 LAB — POCT INR: INR: 2.4

## 2012-04-05 NOTE — Telephone Encounter (Signed)
Resume Lopressor, but at 25 bid. Reassess at OV on 7/12

## 2012-04-05 NOTE — Telephone Encounter (Signed)
No answer

## 2012-04-07 MED ORDER — METOPROLOL TARTRATE 50 MG PO TABS: 25.0000 mg | ORAL_TABLET | Freq: Two times a day (BID) | ORAL | Status: DC

## 2012-04-07 NOTE — Telephone Encounter (Signed)
Patient notified and verbalized understanding.  Will reassess tomorrow.  Advised to bring all medications.

## 2012-04-08 ENCOUNTER — Ambulatory Visit (INDEPENDENT_AMBULATORY_CARE_PROVIDER_SITE_OTHER): Payer: Medicare Other | Admitting: *Deleted

## 2012-04-08 ENCOUNTER — Ambulatory Visit (INDEPENDENT_AMBULATORY_CARE_PROVIDER_SITE_OTHER): Payer: Medicare Other | Admitting: Physician Assistant

## 2012-04-08 VITALS — BP 142/82 | HR 65 | Ht 62.0 in | Wt 253.8 lb

## 2012-04-08 DIAGNOSIS — I10 Essential (primary) hypertension: Secondary | ICD-10-CM

## 2012-04-08 DIAGNOSIS — I4891 Unspecified atrial fibrillation: Secondary | ICD-10-CM

## 2012-04-08 DIAGNOSIS — I480 Paroxysmal atrial fibrillation: Secondary | ICD-10-CM

## 2012-04-08 DIAGNOSIS — Z7901 Long term (current) use of anticoagulants: Secondary | ICD-10-CM

## 2012-04-08 DIAGNOSIS — H5340 Unspecified visual field defects: Secondary | ICD-10-CM | POA: Insufficient documentation

## 2012-04-08 LAB — POCT INR: INR: 2.6

## 2012-04-08 NOTE — Assessment & Plan Note (Signed)
Improved following recent results she of Lopressor, albeit at reduced dose of 25 twice a day. Continue to monitor closely.

## 2012-04-08 NOTE — Patient Instructions (Signed)
Continue all current medications. Referral to Ophthamologist Follow up in  3 months

## 2012-04-08 NOTE — Assessment & Plan Note (Signed)
Will refer patient to local ophthalmologist for formal evaluation of persistent, bilateral visual field defect.

## 2012-04-08 NOTE — Assessment & Plan Note (Signed)
Maintaining NSR following recent successful DC cardioversion on June 20. Recommend continuing Coumadin anticoagulation indefinitely, secondary to increased thromboembolic risk (CHADSVASC: 3). Continue current dose of Lopressor. Patient not on any other rate controlling agents. Schedule return followup with myself/[name] in 3 months.

## 2012-04-08 NOTE — Assessment & Plan Note (Signed)
No current evidence of decompensated diastolic heart failure. Patient has a recent 11 pounds since last OV. Most recent 2-D echo indicated normal RVF, as well. Patient has chronic LE edema, which has remained stable.

## 2012-04-08 NOTE — Progress Notes (Signed)
Primary Cardiologist: Lewayne Bunting, MD   HPI: Patient presents in followup of recent successful DC cardioversion, by [name], on June 20.  She states that she "can finally walk". She is very pleased, and suggests significant improvement in her overall exercise tolerance. She denies DOE, or any further palpitations. She has been followed in our Coumadin clinic, and had an INR of 2.6 today.  In the interim, she was placed back on Lopressor, albeit at the lower dose of 25 bid, after she called c/o increasing BPs in the 160-170 systolic range.   12-lead EKG in the office today, reviewed by me, indicates NSR  with first degree AVB at 65 bpm   No Known Allergies   Current Outpatient Prescriptions  Medication Sig Dispense Refill  . acetaminophen-codeine (TYLENOL #2) 300-15 MG per tablet Take 1 tablet by mouth every 4 (four) hours as needed.      Marland Kitchen albuterol (PROVENTIL) (2.5 MG/3ML) 0.083% nebulizer solution Take 2.5 mg by nebulization every 6 (six) hours as needed.      Marland Kitchen alendronate (FOSAMAX) 70 MG tablet Take 1 tablet by mouth Once a week.      . Fluticasone-Salmeterol (ADVAIR DISKUS) 250-50 MCG/DOSE AEPB Inhale 1 puff into the lungs every 12 (twelve) hours.      . furosemide (LASIX) 40 MG tablet Take 1.5 tablets (60 mg total) by mouth every morning.  45 tablet  6  . levothyroxine (SYNTHROID, LEVOTHROID) 50 MCG tablet Take 50 mcg by mouth daily.      Marland Kitchen loratadine (CLARITIN) 10 MG tablet Take 10 mg by mouth daily.      . metoprolol (LOPRESSOR) 50 MG tablet Take 0.5 tablets (25 mg total) by mouth 2 (two) times daily.      . montelukast (SINGULAIR) 10 MG tablet Take 10 mg by mouth at bedtime.      . nabumetone (RELAFEN) 500 MG tablet Take 1 tablet by mouth Twice daily as needed.      . potassium chloride SA (K-DUR,KLOR-CON) 20 MEQ tablet Take 1 tablet (20 mEq total) by mouth daily.  30 tablet  6  . rosuvastatin (CRESTOR) 10 MG tablet Take 10 mg by mouth daily.      Marland Kitchen warfarin (COUMADIN) 5 MG  tablet Take 5 mg by mouth as directed.        Past Medical History  Diagnosis Date  . Atrial fibrillation   . CHF (congestive heart failure)   . Lower extremity edema   . Mitral stenosis     Mild, by catheterization 4/13  . Pulmonary hypertension     Moderate, by catheterization 4/13  . CAD (coronary artery disease)     Nonobstructive, by catheterization 4/13  . Hypothyroidism   . HTN (hypertension)   . Morbid obesity   . HLD (hyperlipidemia)     No past surgical history on file.  History   Social History  . Marital Status: Single    Spouse Name: N/A    Number of Children: N/A  . Years of Education: N/A   Occupational History  . Not on file.   Social History Main Topics  . Smoking status: Former Smoker -- 1.0 packs/day for 8 years    Types: Cigarettes    Quit date: 09/28/1981  . Smokeless tobacco: Never Used  . Alcohol Use: Not on file  . Drug Use: Not on file  . Sexually Active: Not on file   Other Topics Concern  . Not on file   Social History Narrative  Has 2 grown childrenLives alone    Family History  Problem Relation Age of Onset  . Heart disease Father 67  . Deep vein thrombosis Father   . Cancer Mother     right breast   . Cancer Sister     brain cancer  . Stomach cancer Brother     ROS: no nausea, vomiting; no fever, chills; no melena, hematochezia; no claudication; patient indicates several months history of persistent, bilateral visual field defect, described as "little black dots"  PHYSICAL EXAM: BP 142/82  Pulse 65  Ht 5\' 2"  (1.575 m)  Wt 253 lb 12.8 oz (115.123 kg)  BMI 46.42 kg/m2  SpO2 93% GENERAL: 71 year old female, obese, sitting in wheelchair; NAD  HEENT: NCAT, PERRLA, EOMI; sclera clear; no xanthelasma  NECK: palpable bilateral carotid pulses, no bruits; unable to assess JVD, secondary to neck girth  LUNGS: CTA bilaterally  CARDIAC: RRR (S1, S2); soft grade 2/6 short systolic ejection murmur at base  ABDOMEN: protuberant    EXTREMETIES: Chronic, 2-3+ bilateral nonpitting peripheral edema  SKIN: warm/dry; no obvious rash/lesions  MUSCULOSKELETAL: no joint deformity  NEURO: no focal deficit; NL affect    EKG: reviewed and available in Electronic Records   ASSESSMENT & PLAN:  PAF (paroxysmal atrial fibrillation) Maintaining NSR following recent successful DC cardioversion on June 20. Recommend continuing Coumadin anticoagulation indefinitely, secondary to increased thromboembolic risk (CHADSVASC: 3). Continue current dose of Lopressor. Patient not on any other rate controlling agents. Schedule return followup with myself/[name] in 3 months.  HTN (hypertension) Improved following recent results she of Lopressor, albeit at reduced dose of 25 twice a day. Continue to monitor closely.  Diastolic heart failure No current evidence of decompensated diastolic heart failure. Patient has a recent 11 pounds since last OV. Most recent 2-D echo indicated normal RVF, as well. Patient has chronic LE edema, which has remained stable.  Visual field defect Will refer patient to local ophthalmologist for formal evaluation of persistent, bilateral visual field defect.    Gene Shadrack Brummitt, PAC

## 2012-04-29 ENCOUNTER — Ambulatory Visit (INDEPENDENT_AMBULATORY_CARE_PROVIDER_SITE_OTHER): Payer: Medicare Other | Admitting: *Deleted

## 2012-04-29 DIAGNOSIS — I4891 Unspecified atrial fibrillation: Secondary | ICD-10-CM

## 2012-04-29 DIAGNOSIS — Z7901 Long term (current) use of anticoagulants: Secondary | ICD-10-CM

## 2012-04-29 DIAGNOSIS — I480 Paroxysmal atrial fibrillation: Secondary | ICD-10-CM

## 2012-05-02 ENCOUNTER — Telehealth: Payer: Self-pay | Admitting: *Deleted

## 2012-05-02 NOTE — Telephone Encounter (Signed)
Message copied by Carmela Hurt on Mon May 02, 2012  1:13 PM ------      Message from: Rande Brunt      Created: Mon May 02, 2012 11:36 AM      Regarding: RE: cataract surgery 05/09/2012       Noted. Thanks,            Gene            ----- Message -----         From: Carmela Hurt, RN         Sent: 04/29/2012   2:50 PM           To: Prescott Parma, PA, Louanna Raw, RN      Subject: cataract surgery 05/09/2012                               Dr Lita Mains to remove cataract left eye 05/09/2012,  wants cardiologist to know of surgery but Dr Lita Mains has not instructed patient to hold her coumadin, will send a note to MD and coumadin clinic nurse.            Addison Lank, RN

## 2012-05-27 ENCOUNTER — Ambulatory Visit (INDEPENDENT_AMBULATORY_CARE_PROVIDER_SITE_OTHER): Payer: Medicare Other | Admitting: *Deleted

## 2012-05-27 DIAGNOSIS — I4891 Unspecified atrial fibrillation: Secondary | ICD-10-CM

## 2012-05-27 DIAGNOSIS — Z7901 Long term (current) use of anticoagulants: Secondary | ICD-10-CM

## 2012-05-27 DIAGNOSIS — I480 Paroxysmal atrial fibrillation: Secondary | ICD-10-CM

## 2012-07-12 ENCOUNTER — Ambulatory Visit (INDEPENDENT_AMBULATORY_CARE_PROVIDER_SITE_OTHER): Payer: Medicare Other | Admitting: Physician Assistant

## 2012-07-12 ENCOUNTER — Encounter: Payer: Self-pay | Admitting: Physician Assistant

## 2012-07-12 ENCOUNTER — Ambulatory Visit (INDEPENDENT_AMBULATORY_CARE_PROVIDER_SITE_OTHER): Payer: Medicare Other | Admitting: *Deleted

## 2012-07-12 VITALS — BP 134/83 | HR 60 | Ht 62.0 in | Wt 255.0 lb

## 2012-07-12 DIAGNOSIS — Z7901 Long term (current) use of anticoagulants: Secondary | ICD-10-CM

## 2012-07-12 DIAGNOSIS — I4891 Unspecified atrial fibrillation: Secondary | ICD-10-CM

## 2012-07-12 DIAGNOSIS — I10 Essential (primary) hypertension: Secondary | ICD-10-CM

## 2012-07-12 DIAGNOSIS — I480 Paroxysmal atrial fibrillation: Secondary | ICD-10-CM

## 2012-07-12 DIAGNOSIS — H5340 Unspecified visual field defects: Secondary | ICD-10-CM

## 2012-07-12 NOTE — Assessment & Plan Note (Signed)
Stable on current medication regimen 

## 2012-07-12 NOTE — Assessment & Plan Note (Signed)
Maintaining NSR on current medication regimen. On chronic Coumadin anticoagulation, followed here in our clinic. Patient will not need to hold her Coumadin, prior to undergoing staged bilateral cataract surgery.

## 2012-07-12 NOTE — Assessment & Plan Note (Signed)
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 She is cleared from a cardiac perspective and deemed at an acceptable risk to proceed, requiring no further workup. Of note, she will not need to hold Coumadin prior to the procedure.

## 2012-07-12 NOTE — Assessment & Plan Note (Signed)
Euvolemic by history and physical exam. Continue current diuretic regimen.

## 2012-07-12 NOTE — Patient Instructions (Signed)
Continue all current medications. Patient cleared for cataract surgery - copy of office visit dictation given to patient  Your physician wants you to follow up in: 6 months.  You will receive a reminder letter in the mail one-two months in advance.  If you don't receive a letter, please call our office to schedule the follow up appointment

## 2012-07-12 NOTE — Progress Notes (Signed)
Primary Cardiologist: Ann Bonito, MD (new)   HPI: Patient presents for scheduled followup, as well as preoperative cardiac clearance for recommended bilateral cataract surgery.  Since last seen in clinic, July 2013, patient has been doing extremely well. She denies any symptoms suggestive of decompensated heart failure. She denies any tachycardia palpitations. She also suggests marked improvement in symptoms attributed to her "asthma". She is reporting much more mobility and increased exercise tolerance.  12-lead EKG today, reviewed by me, indicates NSR at 62 bpm with first-degree AV block; nonspecific ST changes.   No Known Allergies  Current Outpatient Prescriptions  Medication Sig Dispense Refill  . acetaminophen-codeine (TYLENOL #2) 300-15 MG per tablet Take 1 tablet by mouth every 4 (four) hours as needed.      Marland Kitchen albuterol (PROVENTIL) (2.5 MG/3ML) 0.083% nebulizer solution Take 2.5 mg by nebulization every 6 (six) hours as needed.      Marland Kitchen alendronate (FOSAMAX) 70 MG tablet Take 1 tablet by mouth Once a week.      . Fluticasone-Salmeterol (ADVAIR DISKUS) 250-50 MCG/DOSE AEPB Inhale 1 puff into the lungs every 12 (twelve) hours.      . furosemide (LASIX) 40 MG tablet Take 1.5 tablets (60 mg total) by mouth every morning.  45 tablet  6  . levothyroxine (SYNTHROID, LEVOTHROID) 50 MCG tablet Take 50 mcg by mouth daily.      Marland Kitchen loratadine (CLARITIN) 10 MG tablet Take 10 mg by mouth daily.      . metoprolol (LOPRESSOR) 50 MG tablet Take 12.5 mg by mouth daily.      . montelukast (SINGULAIR) 10 MG tablet Take 10 mg by mouth at bedtime.      . nabumetone (RELAFEN) 500 MG tablet Take 1 tablet by mouth Twice daily as needed.      . potassium chloride SA (K-DUR,KLOR-CON) 20 MEQ tablet Take 1 tablet (20 mEq total) by mouth daily.  30 tablet  6  . rosuvastatin (CRESTOR) 10 MG tablet Take 10 mg by mouth daily.      Marland Kitchen warfarin (COUMADIN) 5 MG tablet Take 5 mg by mouth as directed.        Past Medical  History  Diagnosis Date  . Atrial fibrillation   . CHF (congestive heart failure)   . Lower extremity edema   . Mitral stenosis     Mild, by catheterization 4/13  . Pulmonary hypertension     Moderate, by catheterization 4/13  . CAD (coronary artery disease)     Nonobstructive, by catheterization 4/13  . Hypothyroidism   . HTN (hypertension)   . Morbid obesity   . HLD (hyperlipidemia)     No past surgical history on file.  History   Social History  . Marital Status: Single    Spouse Name: N/A    Number of Children: N/A  . Years of Education: N/A   Occupational History  . Not on file.   Social History Main Topics  . Smoking status: Former Smoker -- 1.0 packs/day for 8 years    Types: Cigarettes    Quit date: 09/28/1981  . Smokeless tobacco: Never Used  . Alcohol Use: Not on file  . Drug Use: Not on file  . Sexually Active: Not on file   Other Topics Concern  . Not on file   Social History Narrative   Has 2 grown childrenLives alone    Family History  Problem Relation Age of Onset  . Heart disease Father 38  . Deep vein thrombosis Father   .  Cancer Mother     right breast   . Cancer Sister     brain cancer  . Stomach cancer Brother     ROS: no nausea, vomiting; no fever, chills; no melena, hematochezia; no claudication  PHYSICAL EXAM: BP 134/83  Pulse 60  Ht 5\' 2"  (1.575 m)  Wt 255 lb (115.667 kg)  BMI 46.64 kg/m2 GENERAL: 71 year old female, obese; NAD HEENT: NCAT, PERRLA, EOMI; sclera clear; no xanthelasma NECK: no bruits; no JVD; no TM LUNGS: CTA bilaterally CARDIAC: RRR (S1, S2); no significant murmurs; no rubs or gallops ABDOMEN: Protuberant  EXTREMETIES: 3+ bilateral, nonpitting peripheral edema SKIN: warm/dry; no obvious rash/lesions MUSCULOSKELETAL: no joint deformity NEURO: no focal deficit; NL affect   EKG: reviewed and available in Electronic Records   ASSESSMENT & PLAN:  PAF (paroxysmal atrial fibrillation) Maintaining NSR  on current medication regimen. On chronic Coumadin anticoagulation, followed here in our clinic. Patient will not need to hold her Coumadin, prior to undergoing staged bilateral cataract surgery.  Diastolic heart failure Euvolemic by history and physical exam. Continue current diuretic regimen.  HTN (hypertension) Stable on current medication regimen  Visual field defect Patient has since been diagnosed with bilateral cataracts, with recommendation to undergo staged bilateral surgery. She is cleared from a cardiac perspective and deemed at an acceptable risk to proceed, requiring no further workup. Of note, she will not need to hold Coumadin prior to the procedure.    Ann Potter, PAC

## 2012-07-15 ENCOUNTER — Ambulatory Visit: Payer: Medicare Other | Admitting: Cardiology

## 2012-07-29 ENCOUNTER — Telehealth: Payer: Self-pay | Admitting: Physician Assistant

## 2012-07-29 NOTE — Telephone Encounter (Signed)
Patient's daughter called re: mother c/o of chest pain and weight increase recently. She tells me that her mother called this afternoon after awaking from a nap complaining of L-sided chest pain under her breast and stating "I am having a heart attack." She has also noted a weight increase (baseline 255 lbs per dtr), but this is unable to be quantified. No further description provided. The dtr is concerned she was having a heart attack w/ signs of volume overload, but mother called back stating she was feeling much better and pain was gone which had occurred since initial page. Reviewing patient's chart, she had cath 04/13 revealing mild, nonobstructive CAD.  Advised clinical impression is difficult to ascertain over the phone and best option would be formal eval at the ED. Dtr preferred to have mother call back in ~ 30 min to reassess how she is feeling. She wants to visit her tomorrow morning to check her BP, weight and signs of swelling.  Dtr wishes to continue to monitor. If she is c/o recurrent chest pain tonight or over the weekend and exhibits weight increase or edema tomorrow, she will present to the ED for formal evaluation. She understood and agreed.    Jacqulyn Bath, PA-C 07/29/2012 6:13 PM

## 2012-07-31 ENCOUNTER — Telehealth: Payer: Self-pay | Admitting: Nurse Practitioner

## 2012-07-31 NOTE — Telephone Encounter (Signed)
Pts HHRN called the answering service asking that I call the pt.  I called pt and she reported that since last phone call on 11/1, at which time she noted sharp chest pain and had been having swelling and weight gain, that her weight has cont to slowly rise.  She is now 4 lbs above her baseline.  She never did go to the ER on 11/1 as advised.  She has not had any more chest pain and denies dyspnea.  I advised that she take an extra lasix 40mg  x 1 now and present to the ED if Ss worsen.  She plans to see her PCP in the AM and tells me that if she feels any worse, that she we will go to the ER.

## 2012-08-01 ENCOUNTER — Emergency Department (HOSPITAL_COMMUNITY): Payer: Medicare Other

## 2012-08-01 ENCOUNTER — Telehealth: Payer: Self-pay | Admitting: Physician Assistant

## 2012-08-01 ENCOUNTER — Encounter (HOSPITAL_COMMUNITY): Payer: Self-pay | Admitting: *Deleted

## 2012-08-01 ENCOUNTER — Emergency Department (HOSPITAL_COMMUNITY)
Admission: EM | Admit: 2012-08-01 | Discharge: 2012-08-01 | Disposition: A | Discharge status: Home / Self Care | Payer: Medicare Other | Attending: Emergency Medicine | Admitting: Emergency Medicine

## 2012-08-01 DIAGNOSIS — I4891 Unspecified atrial fibrillation: Secondary | ICD-10-CM | POA: Insufficient documentation

## 2012-08-01 DIAGNOSIS — I2789 Other specified pulmonary heart diseases: Secondary | ICD-10-CM | POA: Insufficient documentation

## 2012-08-01 DIAGNOSIS — N289 Disorder of kidney and ureter, unspecified: Secondary | ICD-10-CM

## 2012-08-01 DIAGNOSIS — I2510 Atherosclerotic heart disease of native coronary artery without angina pectoris: Secondary | ICD-10-CM | POA: Insufficient documentation

## 2012-08-01 DIAGNOSIS — I509 Heart failure, unspecified: Secondary | ICD-10-CM

## 2012-08-01 DIAGNOSIS — Z79899 Other long term (current) drug therapy: Secondary | ICD-10-CM | POA: Insufficient documentation

## 2012-08-01 DIAGNOSIS — R609 Edema, unspecified: Secondary | ICD-10-CM | POA: Insufficient documentation

## 2012-08-01 DIAGNOSIS — E785 Hyperlipidemia, unspecified: Secondary | ICD-10-CM | POA: Insufficient documentation

## 2012-08-01 DIAGNOSIS — Z87891 Personal history of nicotine dependence: Secondary | ICD-10-CM | POA: Insufficient documentation

## 2012-08-01 DIAGNOSIS — I10 Essential (primary) hypertension: Secondary | ICD-10-CM | POA: Insufficient documentation

## 2012-08-01 DIAGNOSIS — I050 Rheumatic mitral stenosis: Secondary | ICD-10-CM | POA: Insufficient documentation

## 2012-08-01 LAB — COMPREHENSIVE METABOLIC PANEL
AST: 19 U/L (ref 0–37)
Albumin: 3.5 g/dL (ref 3.5–5.2)
Alkaline Phosphatase: 67 U/L (ref 39–117)
Chloride: 101 mEq/L (ref 96–112)
Creatinine, Ser: 1.33 mg/dL — ABNORMAL HIGH (ref 0.50–1.10)
Potassium: 4.2 mEq/L (ref 3.5–5.1)
Total Bilirubin: 0.6 mg/dL (ref 0.3–1.2)
Total Protein: 7.1 g/dL (ref 6.0–8.3)

## 2012-08-01 LAB — CBC WITH DIFFERENTIAL/PLATELET
Basophils Absolute: 0 10*3/uL (ref 0.0–0.1)
Basophils Relative: 1 % (ref 0–1)
Eosinophils Absolute: 0.1 10*3/uL (ref 0.0–0.7)
MCHC: 33.9 g/dL (ref 30.0–36.0)
Neutro Abs: 2.6 10*3/uL (ref 1.7–7.7)
Neutrophils Relative %: 63 % (ref 43–77)
Platelets: 128 10*3/uL — ABNORMAL LOW (ref 150–400)
RDW: 12.7 % (ref 11.5–15.5)

## 2012-08-01 LAB — POCT I-STAT TROPONIN I: Troponin i, poc: 0.02 ng/mL (ref 0.00–0.08)

## 2012-08-01 LAB — PROTIME-INR: INR: 2.2 — ABNORMAL HIGH (ref 0.00–1.49)

## 2012-08-01 NOTE — ED Provider Notes (Signed)
History     CSN: [id number]  Arrival date & time 08/01/12  1426   First MD Initiated Contact with Patient 08/01/12 1826      Chief Complaint  Patient presents with  . Palpitations     (Consider location/radiation/quality/duration/timing/severity/associated sxs/prior treatment) HPI Comments: Ann Potter is a 71 y.o. Female who had an episode of chest pain, for 10 minutes, after waking from a nap 3 days ago. She had been contacted, by her home health nurse, 4 days ago; to ask her to see her doctor for weight gain. Her baseline. Weight is 254 pounds. She had been up to 260 pounds. On the advice of her physician's assistant, she took extra Lasix yesterday. Today, her weight was 257 pounds. She has mild dyspnea on exertion. She has not had recurrence of chest pain. She denies nausea, vomiting, weakness, or dizziness. She's taking her medicines as usual. There are no other aggravating or alleviating factors.  The history is provided by the patient.    Past Medical History  Diagnosis Date  . Atrial fibrillation   . CHF (congestive heart failure)   . Lower extremity edema   . Mitral stenosis     Mild, by catheterization 4/13  . Pulmonary hypertension     Moderate, by catheterization 4/13  . CAD (coronary artery disease)     Nonobstructive, by catheterization 4/13  . Hypothyroidism   . HTN (hypertension)   . Morbid obesity   . HLD (hyperlipidemia)     History reviewed. No pertinent past surgical history.  Family History  Problem Relation Age of Onset  . Heart disease Father 31  . Deep vein thrombosis Father   . Cancer Mother     right breast   . Cancer Sister     brain cancer  . Stomach cancer Brother     History  Substance Use Topics  . Smoking status: Former Smoker -- 1.0 packs/day for 8 years    Types: Cigarettes    Quit date: 09/28/1981  . Smokeless tobacco: Never Used  . Alcohol Use: No    OB History    Grav Para Term Preterm Abortions TAB SAB Ect Mult  Living                  Review of Systems  All other systems reviewed and are negative.    Allergies  Review of patient's allergies indicates no known allergies.  Home Medications   Current Outpatient Rx  Name  Route  Sig  Dispense  Refill  . ACETAMINOPHEN-CODEINE #2 300-15 MG PO TABS   Oral   Take 1 tablet by mouth every 4 (four) hours as needed. For pain         . ALBUTEROL SULFATE HFA 108 (90 BASE) MCG/ACT IN AERS   Inhalation   Inhale 2 puffs into the lungs every 6 (six) hours as needed. For shortness of breath         . ALBUTEROL SULFATE (2.5 MG/3ML) 0.083% IN NEBU   Nebulization   Take 2.5 mg by nebulization every 6 (six) hours as needed.         . ALENDRONATE SODIUM 70 MG PO TABS   Oral   Take 1 tablet by mouth Once a week.         [place]   Inhalation   Inhale 2 puffs into the lungs 2 (two) times daily. Patient gets inhaler from doctor and does not know the strength         .  FUROSEMIDE 40 MG PO TABS   Oral   Take 1.5 tablets (60 mg total) by mouth every morning.   45 tablet   6     Dose increased 02/15/2012.   Marland Kitchen LEVOTHYROXINE SODIUM 50 MCG PO TABS   Oral   Take 50 mcg by mouth daily.         Marland Kitchen LORATADINE 10 MG PO TABS   Oral   Take 10 mg by mouth daily.         Marland Kitchen METOPROLOL TARTRATE 50 MG PO TABS   Oral   Take 12.5 mg by mouth 2 (two) times daily.          Marland Kitchen MONTELUKAST SODIUM 10 MG PO TABS   Oral   Take 10 mg by mouth at bedtime.         Marland Kitchen POTASSIUM CHLORIDE CRYS ER 20 MEQ PO TBCR   Oral   Take 1 tablet (20 mEq total) by mouth daily.   30 tablet   6   . ROSUVASTATIN CALCIUM 10 MG PO TABS   Oral   Take 10 mg by mouth daily.         . WARFARIN SODIUM 5 MG PO TABS   Oral   Take 5 mg by mouth daily.            BP 155/68  Pulse 52  Temp 98.7 F (37.1 C) (Oral)  Resp 17  SpO2 100%  Physical Exam  Nursing note and vitals reviewed. Constitutional: She is oriented to person, place, and time. She appears  well-developed and well-nourished.  HENT:  Head: Normocephalic and atraumatic.  Eyes: Conjunctivae normal and EOM are normal. Pupils are equal, round, and reactive to light.  Neck: Normal range of motion and phonation normal. Neck supple.  Cardiovascular: Normal rate, regular rhythm and intact distal pulses.   Pulmonary/Chest: Effort normal and breath sounds normal. She exhibits no tenderness.  Abdominal: Soft. She exhibits no distension. There is no tenderness. There is no guarding.  Musculoskeletal: Normal range of motion. She exhibits edema (moderate peripheral edema. Legs are nontender).  Neurological: She is alert and oriented to person, place, and time. She has normal strength. She exhibits normal muscle tone.  Skin: Skin is warm and dry.  Psychiatric: She has a normal mood and affect. Her behavior is normal. Judgment and thought content normal.    ED Course  Procedures (including critical care time)  Findings discussed with patient and her daughter, who is with her and is knowledgeable about her health.     Date: 07/15/2012  Rate: 57  Rhythm: normal sinus rhythm  QRS Axis: normal  PR and QT Intervals: PR prolonged  ST/T Wave abnormalities: normal  PR and QRS Conduction Disutrbances:first-degree A-V block   Narrative Interpretation:   Old EKG Reviewed: none available   Labs Reviewed  CBC WITH DIFFERENTIAL - Abnormal; Notable for the following:    RBC 3.65 (*)     Hemoglobin 11.8 (*)     HCT 34.8 (*)     Platelets 128 (*)     All other components within normal limits  COMPREHENSIVE METABOLIC PANEL - Abnormal; Notable for the following:    Glucose, Bld 105 (*)     BUN 26 (*)     Creatinine, Ser 1.33 (*)     GFR calc non Af Amer 39 (*)     GFR calc Af Amer 45 (*)     All other components within normal limits  PROTIME-INR -  Abnormal; Notable for the following:    Prothrombin Time 23.5 (*)     INR 2.20 (*)     All other components within normal limits  PRO B  NATRIURETIC PEPTIDE - Abnormal; Notable for the following:    Pro B Natriuretic peptide (BNP) 1457.0 (*)     All other components within normal limits  POCT I-STAT TROPONIN I   Dg Chest 2 View  08/01/2012  *RADIOLOGY REPORT*  Clinical Data: Shortness of breath, history hypertension, asthma  CHEST - 2 VIEW  Comparison: 12/30/2011  Findings: Enlargement of cardiac silhouette with pulmonary vascular congestion. Mildly tortuous thoracic aorta. Minimal peribronchial thickening. No definite acute failure or consolidation. Slight chronic accentuation of right infrahilar markings stable. No pleural effusion or pneumothorax. Bones unremarkable.  IMPRESSION: Enlargement of cardiac silhouette with pulmonary vascular congestion. Bronchitic changes with minimal chronic accentuation of interstitial markings. No definite acute pulmonary edema or consolidation.   Original Report Authenticated By: Ulyses Southward, M.D.    Nursing notes, applicable records and vitals reviewed.  Radiologic Images/Reports reviewed.   No diagnosis found.    MDM  Mild congestive heart failure, improving with increasing dose of Lasix. There are no available comparison Labs to trend. She has mild renal insufficiency, with mild BUN elevation. INR is normal. She is in normal sinus rhythm. Doubt CHF, ACS, PE, pneumonia, or metabolic instability.     Plan: Home Medications- usual plus increase Lasix to 60 mg daily for 3 days; Home Treatments- rest; Recommended follow up- contact cardiology for followup appointment in 3 or 4 days    Flint Melter, MD 08/02/12 1652

## 2012-08-01 NOTE — Telephone Encounter (Signed)
Discussed below with patient.  Advised on 2 separate occasions on 11/1 & 11/3 to go to ED for these symptoms.  (See telephone notes from the weekend.)  Stated she was there alone and didn't want to be a bother.  Advised that she could call 911 & they would transport her to ED.  Stated she also did not see her PMD this morning as she previously discussed with Ward Givens, NP.  Patient states she will go to ED & get herself checked out.

## 2012-08-01 NOTE — Telephone Encounter (Signed)
Had a spell (was sleep woke with chest pain,sweats, and nauseau) Saturday night and ever since has been having problem walking and dizziness since this happen. She did not go to hospital

## 2012-08-01 NOTE — ED Notes (Signed)
SAturday had heavy chest pain and resolved.  Pt states that she had gained a lot of fluid in 24 hours.  Sunday was not feeling better.  Pt had cardioversion in June this year.  Pt states she had some nausea with this episode.  Pt has had increased sob.  Pt states since this episode has been extremely fatigued

## 2012-08-01 NOTE — ED Notes (Signed)
Pt. Alert and oriented x4. Verbalized understanding of discharge instructions.  

## 2012-08-01 NOTE — ED Notes (Signed)
Dr. Wentz at bedside. 

## 2012-08-03 ENCOUNTER — Ambulatory Visit: Payer: Medicare Other | Admitting: Physician Assistant

## 2012-08-04 ENCOUNTER — Ambulatory Visit: Payer: Medicare Other | Admitting: Cardiology

## 2012-08-18 ENCOUNTER — Other Ambulatory Visit: Payer: Self-pay | Admitting: Cardiology

## 2012-08-18 MED ORDER — FUROSEMIDE 40 MG PO TABS: 60.0000 mg | ORAL_TABLET | Freq: Every morning | ORAL | Status: DC

## 2012-09-26 ENCOUNTER — Other Ambulatory Visit: Payer: Self-pay | Admitting: Cardiology

## 2012-09-26 MED ORDER — POTASSIUM CHLORIDE CRYS ER 20 MEQ PO TBCR: 20.0000 meq | EXTENDED_RELEASE_TABLET | Freq: Every day | ORAL | Status: DC

## 2013-03-21 ENCOUNTER — Other Ambulatory Visit: Payer: Self-pay | Admitting: Orthopedic Surgery

## 2013-03-21 DIAGNOSIS — M545 Low back pain: Secondary | ICD-10-CM

## 2013-03-30 ENCOUNTER — Ambulatory Visit
Admission: RE | Admit: 2013-03-30 | Discharge: 2013-03-30 | Disposition: A | Discharge status: Home / Self Care | Payer: Medicare Other | Source: Ambulatory Visit | Attending: Orthopedic Surgery | Admitting: Orthopedic Surgery

## 2013-03-30 DIAGNOSIS — M545 Low back pain: Secondary | ICD-10-CM

## 2013-04-17 ENCOUNTER — Ambulatory Visit: Payer: Medicare Other | Attending: Orthopedic Surgery | Admitting: Physical Therapy

## 2013-04-17 DIAGNOSIS — R5381 Other malaise: Secondary | ICD-10-CM | POA: Insufficient documentation

## 2013-04-17 DIAGNOSIS — M545 Low back pain, unspecified: Secondary | ICD-10-CM | POA: Insufficient documentation

## 2013-04-17 DIAGNOSIS — IMO0001 Reserved for inherently not codable concepts without codable children: Secondary | ICD-10-CM | POA: Insufficient documentation

## 2013-04-20 ENCOUNTER — Ambulatory Visit: Payer: Medicare Other | Admitting: Physical Therapy

## 2013-04-26 ENCOUNTER — Encounter: Payer: Medicare Other | Admitting: Physical Therapy

## 2013-05-16 ENCOUNTER — Other Ambulatory Visit: Payer: Self-pay | Admitting: Physician Assistant

## 2013-05-24 ENCOUNTER — Ambulatory Visit: Payer: Self-pay | Admitting: Pharmacist

## 2013-05-24 DIAGNOSIS — I480 Paroxysmal atrial fibrillation: Secondary | ICD-10-CM

## 2013-05-24 DIAGNOSIS — Z7901 Long term (current) use of anticoagulants: Secondary | ICD-10-CM

## 2013-06-13 ENCOUNTER — Ambulatory Visit: Payer: Medicare Other | Admitting: Cardiovascular Disease

## 2013-07-05 ENCOUNTER — Ambulatory Visit: Payer: Medicare Other | Admitting: Cardiology

## 2015-02-20 ENCOUNTER — Ambulatory Visit: Payer: Self-pay | Admitting: Cardiology

## 2015-03-18 ENCOUNTER — Ambulatory Visit: Payer: Self-pay | Admitting: Physician Assistant

## 2015-03-21 ENCOUNTER — Ambulatory Visit: Payer: Self-pay | Admitting: Cardiology

## 2015-04-05 ENCOUNTER — Ambulatory Visit: Payer: Self-pay | Admitting: Cardiology

## 2015-04-16 ENCOUNTER — Encounter: Payer: Self-pay | Admitting: Cardiology

## 2015-04-16 ENCOUNTER — Ambulatory Visit (INDEPENDENT_AMBULATORY_CARE_PROVIDER_SITE_OTHER): Payer: Medicare Other | Admitting: Cardiology

## 2015-04-16 VITALS — BP 122/74 | HR 89 | Ht 60.0 in | Wt 254.8 lb

## 2015-04-16 DIAGNOSIS — E785 Hyperlipidemia, unspecified: Secondary | ICD-10-CM

## 2015-04-16 DIAGNOSIS — I10 Essential (primary) hypertension: Secondary | ICD-10-CM

## 2015-04-16 DIAGNOSIS — I050 Rheumatic mitral stenosis: Secondary | ICD-10-CM

## 2015-04-16 DIAGNOSIS — I2510 Atherosclerotic heart disease of native coronary artery without angina pectoris: Secondary | ICD-10-CM

## 2015-04-16 DIAGNOSIS — I480 Paroxysmal atrial fibrillation: Secondary | ICD-10-CM | POA: Diagnosis not present

## 2015-04-16 MED ORDER — METOPROLOL TARTRATE 25 MG PO TABS: 12.5000 mg | ORAL_TABLET | Freq: Two times a day (BID) | ORAL | Status: DC

## 2015-04-16 MED ORDER — FUROSEMIDE 40 MG PO TABS: ORAL_TABLET | ORAL | Status: DC

## 2015-04-16 NOTE — Patient Instructions (Signed)
Your physician recommends that you schedule a follow-up appointment in: 2-3 MONTHS WITH DR. BRANCH  Your physician has recommended you make the following change in your medication:   START METOPROLOL 12.5 MG TWICE DAILY AS NEEDED FOR PALPITATIONS OR BLOOD PRESSURE HIGHER THAN 160 (SYSTOLIC- TOP NUMBER)  INCREASE LASIX 60 MG IN THE MORNING AND 40 MG IN THE EVENING FOR THE NEXT 4 DAYS THEN RESUME 60 MG DAILY  Your physician has requested that you have an echocardiogram. Echocardiography is a painless test that uses sound waves to create images of your heart. It provides your doctor with information about the size and shape of your heart and how well your heart's chambers and valves are working. This procedure takes approximately one hour. There are no restrictions for this procedure.  Thank you for choosing [facility]!!

## 2015-04-16 NOTE — Progress Notes (Signed)
Patient ID: Ann Potter, female   DOB: 07-Nov-1940, 74 y.o.   MRN: [id number]     Clinical Summary Ann Potter is a 74 y.o.female last seen by Ann Potter in 2013, this is our first visit together. She is seen for the following medical problems.  1. PAF - on coumadin for stroke prevention. Reports she has previously been on lopressor 12.5mg  prn for rate control but recently ran out.  - notes some occasional palpitations at times, none in the last few months.  2. 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 Mitral stenosis - mild to moderate overall based on TTE, TEE, and cath in 2013 - denies any significant SOB or DOE  4. HTN - currently not on medications, prn lopressor only  5. Hyperlipidemia - compliant with statin  6. Chronic diastolic heart failure - notes some LE edema at times.  - limiting salt intake - compliant with lasix.     Past Medical History  Diagnosis Date  . Atrial fibrillation   . CHF (congestive heart failure)   . Lower extremity edema   . Mitral stenosis     Mild, by catheterization 4/13  . Pulmonary hypertension     Moderate, by catheterization 4/13  . CAD (coronary artery disease)     Nonobstructive, by catheterization 4/13  . Hypothyroidism   . HTN (hypertension)   . Morbid obesity   . HLD (hyperlipidemia)      No Known Allergies   Current Outpatient Prescriptions  Medication Sig Dispense Refill  . acetaminophen-codeine (TYLENOL #2) 300-15 MG per tablet Take 1 tablet by mouth every 4 (four) hours as needed. For pain    . albuterol (PROVENTIL HFA;VENTOLIN HFA) 108 (90 BASE) MCG/ACT inhaler Inhale 2 puffs into the lungs every 6 (six) hours as needed. For shortness of breath    . albuterol (PROVENTIL) (2.5 MG/3ML) 0.083% nebulizer solution Take 2.5 mg by nebulization every 6 (six) hours as needed.    Marland Kitchen alendronate (FOSAMAX) 70 MG tablet Take 1 tablet by mouth Once a week.    . Budesonide-Formoterol Fumarate  (SYMBICORT IN) Inhale 2 puffs into the lungs 2 (two) times daily. Patient gets inhaler from doctor and does not know the strength    . furosemide (LASIX) 40 MG tablet TAKE 1 AND 1/2 TABLETS (60MG  TOTAL) BY MOUTH EVERY MORNING 45 tablet 5  . levothyroxine (SYNTHROID, LEVOTHROID) 50 MCG tablet Take 50 mcg by mouth daily.    Marland Kitchen loratadine (CLARITIN) 10 MG tablet Take 10 mg by mouth daily.    . montelukast (SINGULAIR) 10 MG tablet Take 10 mg by mouth at bedtime.    . potassium chloride SA (K-DUR,KLOR-CON) 20 MEQ tablet Take 1 tablet (20 mEq total) by mouth daily. 30 tablet 6  . rosuvastatin (CRESTOR) 10 MG tablet Take 10 mg by mouth daily.    Marland Kitchen warfarin (COUMADIN) 5 MG tablet Take 5 mg by mouth daily.      No current facility-administered medications for this visit.     No past surgical history on file.   No Known Allergies    Family History  Problem Relation Age of Onset  . Heart disease Father 73  . Deep vein thrombosis Father   . Cancer Mother     right breast   . Cancer Sister     brain cancer  . Stomach cancer Brother      Social History Ann Potter reports that she quit smoking about 33 years ago.  Her smoking use included Cigarettes. She has a 8 pack-year smoking history. She has never used smokeless tobacco. Ann Potter reports that she does not drink alcohol.   Review of Systems CONSTITUTIONAL: No weight loss, fever, chills, weakness or fatigue.  HEENT: Eyes: No visual loss, blurred vision, double vision or yellow sclerae.No hearing loss, sneezing, congestion, runny nose or sore throat.  SKIN: No rash or itching.  CARDIOVASCULAR: per HPI RESPIRATORY: No shortness of breath, cough or sputum.  GASTROINTESTINAL: No anorexia, nausea, vomiting or diarrhea. No abdominal pain or blood.  GENITOURINARY: No burning on urination, no polyuria NEUROLOGICAL: No headache, dizziness, syncope, paralysis, ataxia, numbness or tingling in the extremities. No change in bowel or bladder  control.  MUSCULOSKELETAL: No muscle, back pain, joint pain or stiffness.  LYMPHATICS: No enlarged nodes. No history of splenectomy.  PSYCHIATRIC: No history of depression or anxiety.  ENDOCRINOLOGIC: No reports of sweating, cold or heat intolerance. No polyuria or polydipsia.  Marland Kitchen.   Physical Examination Filed Vitals:   04/16/15 1006  BP: 122/74  Pulse: 89   Filed Vitals:   04/16/15 1006  Height: 5' (1.524 m)  Weight: 254 lb 12.8 oz (115.577 kg)    Gen: resting comfortably, no acute distress HEENT: no scleral icterus, pupils equal round and reactive, no palptable cervical adenopathy,  CV: RRR, no m/r/g, no JVD Resp: Clear to auscultation bilaterally GI: abdomen is soft, non-tender, non-distended, normal bowel sounds, no hepatosplenomegaly MSK: extremities are warm, 2+ bilateral LE edema Skin: warm, no rash Neuro:  no focal deficits Psych: appropriate affect   Diagnostic Studies 12/2011 TEE LVEF 55-60%, mod to severe LAE with spontaneous echocontrast, mild MR, moderate MS MVA 1.5 with mean grad 5 mmHg,     12/2011 Echo LVEF 55-60%, mod MS with mean grad 8,   12/2011 [facility] Procedural Findings: Hemodynamics RA 14 RV 48/13 Ann 50/18 mean 33 PCWP v wave 29, mean 18 LV 119/19 AO 124/62, mean 88  Oxygen saturations: Ann 67 AO 98 SVC 71  Cardiac Output (Fick) 5.2  Cardiac Index (Fick) 2.5  Coronary angiography: Coronary dominance: right  Left mainstem: Mildly calcified, no obstructive disease  Left anterior descending (LAD): Reaches the LV apex, moderate caliber vessel without significant obstructive disease  Left circumflex (LCx): Moderate caliber vessel without significant disease  Right coronary artery (RCA): Large, dominant vessel. Gives off PDA branch. Mild irregularity but no significant obstructive disease  Left ventriculography: Deferred because of reduced creatinine clearance.  Final Conclusions:  1. Minor nonobstructive CAD 2. Moderate  pulmonary HTN suspect primarily due to left heart disease (transpulmonic gradient 14mmHg, PVR 2.7) 3. Mild mitral stenosis  Recommendations: Careful hemodynamic study was done with simultaneous LV/PCWP tracing and I don't appreciate typical features of hemodynamically significant mitral stenosis. There was a large V wave in the wedge tracing which could be suggestive of a noncompliant LV or mitral regurgitation. I will review with Dr Andee LinemaneGent.   Assessment and Plan  1. PAF - no recent symptoms - will refill her prn lopressor, continue coumadin for stroke prevention  2. CAD - nonobstructive by cath, continue medical therapy  3. Mitral stenosis - extensive workup in 2013, overall mild to moderate - will repeat echo in setting of significant LE edema  4. HTN - currently not requiring medical therapy  5. Hyperlipidemia - continue crestor  6. Chronic diastolic HF - volume overloaded. She will take lasix 60mg  in AM and 40mg  in PM for 5 days then resume her 60mg  daily. -  F/u 2-3 weeks.       Antoine Poche, M.D.

## 2015-05-09 ENCOUNTER — Ambulatory Visit (INDEPENDENT_AMBULATORY_CARE_PROVIDER_SITE_OTHER): Payer: Medicare Other

## 2015-05-09 ENCOUNTER — Other Ambulatory Visit: Payer: Self-pay

## 2015-05-09 DIAGNOSIS — I050 Rheumatic mitral stenosis: Secondary | ICD-10-CM

## 2015-05-16 ENCOUNTER — Telehealth: Payer: Self-pay | Admitting: *Deleted

## 2015-05-16 NOTE — Telephone Encounter (Signed)
-----   Message from Antoine Poche, MD sent at 05/13/2015  9:28 AM EDT ----- Echo shows that her mitral valve, the one she had extensive testing on a few years ago, may have worsened. I'd like her to come back and see me in the next 3 weeks to discuss further and consider potential further testing  Dominga Ferry MD

## 2015-05-16 NOTE — Telephone Encounter (Signed)
Pt daughter aware, confirmed 9/7 appt, routed to pcp

## 2015-06-05 ENCOUNTER — Encounter: Payer: Self-pay | Admitting: Cardiology

## 2015-06-05 ENCOUNTER — Ambulatory Visit (INDEPENDENT_AMBULATORY_CARE_PROVIDER_SITE_OTHER): Payer: Medicare Other | Admitting: Cardiology

## 2015-06-05 ENCOUNTER — Encounter: Payer: Self-pay | Admitting: *Deleted

## 2015-06-05 VITALS — BP 121/75 | HR 90 | Ht 60.0 in | Wt 251.0 lb

## 2015-06-05 DIAGNOSIS — I2510 Atherosclerotic heart disease of native coronary artery without angina pectoris: Secondary | ICD-10-CM | POA: Diagnosis not present

## 2015-06-05 DIAGNOSIS — I10 Essential (primary) hypertension: Secondary | ICD-10-CM | POA: Diagnosis not present

## 2015-06-05 DIAGNOSIS — I050 Rheumatic mitral stenosis: Secondary | ICD-10-CM | POA: Diagnosis not present

## 2015-06-05 DIAGNOSIS — I480 Paroxysmal atrial fibrillation: Secondary | ICD-10-CM | POA: Diagnosis not present

## 2015-06-05 DIAGNOSIS — I5032 Chronic diastolic (congestive) heart failure: Secondary | ICD-10-CM

## 2015-06-05 MED ORDER — FUROSEMIDE 40 MG PO TABS: 40.0000 mg | ORAL_TABLET | Freq: Two times a day (BID) | ORAL | Status: DC

## 2015-06-05 NOTE — Progress Notes (Signed)
Patient ID: Ann Potter, female   DOB: 03-31-1941, 74 y.o.   MRN: [id number]     Clinical Summary Ann Potter is a 74 y.o.female seen today for follow up of the following medical problems.   1. PAF - no recent palpitations. Compliant with lopressor for rate control - on coumadin for stroke prevention  2. 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 Mitral stenosis - mild to moderate overall based on TTE, TEE, and cath in 2013 - reports worsening SOB/DOE and LE edema over the last few months. Significant weight gain - since last visit repeated echo that showed severe mean grad across MV of 10 mmHg, calculated area by PHT 1.4. Severely elevated PASP at 89 mmHg, moderate to severe TR. Mildly dilated RV, normal RV function.   - weight 254 to 251 lbs since last visit. She reports baseline is 240 lbs.   4. Chronic diastolic heart failure - symptoms as described above. Repeat echo showed normal LVEF, could not evaluate diastolic dysfunction. Severe biatrial enlargement suggests significant dysfunction - compliant with diuretic   Past Medical History  Diagnosis Date  . Atrial fibrillation   . CHF (congestive heart failure)   . Lower extremity edema   . Mitral stenosis     Mild, by catheterization 4/13  . Pulmonary hypertension     Moderate, by catheterization 4/13  . CAD (coronary artery disease)     Nonobstructive, by catheterization 4/13  . Hypothyroidism   . HTN (hypertension)   . Morbid obesity   . HLD (hyperlipidemia)      No Known Allergies   Current Outpatient Prescriptions  Medication Sig Dispense Refill  . acetaminophen-codeine (TYLENOL #3) 300-30 MG per tablet Take 1 tablet by mouth as needed.     Marland Kitchen albuterol (PROVENTIL HFA;VENTOLIN HFA) 108 (90 BASE) MCG/ACT inhaler Inhale 2 puffs into the lungs every 6 (six) hours as needed. For shortness of breath    . albuterol (PROVENTIL) (2.5 MG/3ML) 0.083% nebulizer solution Take 2.5 mg by  nebulization every 6 (six) hours as needed.    Marland Kitchen alendronate (FOSAMAX) 70 MG tablet Take 1 tablet by mouth Once a week.    . Fluticasone-Salmeterol (ADVAIR) 250-50 MCG/DOSE AEPB Inhale 1 puff into the lungs 2 (two) times daily.    . furosemide (LASIX) 40 MG tablet Take 60 mg (1 1/2 tab) in the AM and 40 mg in the PM for 4 days then 1 1/2 tabs daily 90 tablet 3  . levothyroxine (SYNTHROID, LEVOTHROID) 50 MCG tablet Take 50 mcg by mouth daily.    Marland Kitchen loratadine (CLARITIN) 10 MG tablet Take 10 mg by mouth as needed.     . metoprolol tartrate (LOPRESSOR) 25 MG tablet Take 0.5 tablets (12.5 mg total) by mouth 2 (two) times daily. 145 tablet 3  . montelukast (SINGULAIR) 10 MG tablet Take 10 mg by mouth at bedtime.    . potassium chloride SA (K-DUR,KLOR-CON) 20 MEQ tablet Take 1 tablet (20 mEq total) by mouth daily. 30 tablet 6  . rivaroxaban (XARELTO) 20 MG TABS tablet Take 20 mg by mouth daily with supper.    . rosuvastatin (CRESTOR) 10 MG tablet Take 10 mg by mouth daily.    Marland Kitchen warfarin (COUMADIN) 5 MG tablet Take 5 mg by mouth daily.      No current facility-administered medications for this visit.     No past surgical history on file.   No Known Allergies    Family History  Problem Relation Age of Onset  . Heart disease Father 31  . Deep vein thrombosis Father   . Cancer Mother     right breast   . Cancer Sister     brain cancer  . Stomach cancer Brother      Social History Ann Potter reports that she quit smoking about 33 years ago. Her smoking use included Cigarettes. She has a 8 pack-year smoking history. She has never used smokeless tobacco. Ann Potter reports that she does not drink alcohol.   Review of Systems CONSTITUTIONAL: No weight loss, fever, chills, weakness or fatigue.  HEENT: Eyes: No visual loss, blurred vision, double vision or yellow sclerae.No hearing loss, sneezing, congestion, runny nose or sore throat.  SKIN: No rash or itching.  CARDIOVASCULAR: per  HPI RESPIRATORY: No  cough or sputum.  GASTROINTESTINAL: No anorexia, nausea, vomiting or diarrhea. No abdominal pain or blood.  GENITOURINARY: No burning on urination, no polyuria NEUROLOGICAL: No headache, dizziness, syncope, paralysis, ataxia, numbness or tingling in the extremities. No change in bowel or bladder control.  MUSCULOSKELETAL: No muscle, back pain, joint pain or stiffness.  LYMPHATICS: No enlarged nodes. No history of splenectomy.  PSYCHIATRIC: No history of depression or anxiety.  ENDOCRINOLOGIC: No reports of sweating, cold or heat intolerance. No polyuria or polydipsia.  Marland Kitchen   Physical Examination Filed Vitals:   06/05/15 1128  BP: 121/75  Pulse: 90   Filed Vitals:   06/05/15 1128  Height: 5' (1.524 m)  Weight: 251 lb (113.853 kg)    Gen: resting comfortably, no acute distress HEENT: no scleral icterus, pupils equal round and reactive, no palptable cervical adenopathy,  CV: RRR, 2/6 systolic murmur LLSB, elevated JVD to angle of jaw Resp: Clear to auscultation bilaterally GI: abdomen is soft, non-tender, non-distended, normal bowel sounds, no hepatosplenomegaly MSK: extremities are warm, 1+ bilateral edema.  Skin: warm, no rash Neuro:  no focal deficits Psych: appropriate affect   Diagnostic Studies 12/2011 TEE LVEF 55-60%, mod to severe LAE with spontaneous echocontrast, mild MR, moderate MS MVA 1.5 with mean grad 5 mmHg,    12/2011 Echo LVEF 55-60%, mod MS with mean grad 8,   12/2011 [facility] Procedural Findings: Hemodynamics RA 14 RV 48/13 PA 50/18 mean 33 PCWP v wave 29, mean 18 LV 119/19 AO 124/62, mean 88  Oxygen saturations: PA 67 AO 98 SVC 71  Cardiac Output (Fick) 5.2  Cardiac Index (Fick) 2.5  Coronary angiography: Coronary dominance: right  Left mainstem: Mildly calcified, no obstructive disease  Left anterior descending (LAD): Reaches the LV apex, moderate caliber vessel without significant obstructive disease  Left  circumflex (LCx): Moderate caliber vessel without significant disease  Right coronary artery (RCA): Large, dominant vessel. Gives off PDA Ann Potter. Mild irregularity but no significant obstructive disease  Left ventriculography: Deferred because of reduced creatinine clearance.  Final Conclusions:  1. Minor nonobstructive CAD 2. Moderate pulmonary HTN suspect primarily due to left heart disease (transpulmonic gradient , PVR 2.7) 3. Mild mitral stenosis  Recommendations: Careful hemodynamic study was done with simultaneous LV/PCWP tracing and I don't appreciate typical features of hemodynamically significant mitral stenosis. There was a large V wave in the wedge tracing which could be suggestive of a noncompliant LV or mitral regurgitation. I will review with Dr Andee Lineman.   04/2015 Echo Study Conclusions  - Left ventricle: The cavity size was normal. Wall thickness was normal. Systolic function was normal. The estimated ejection fraction was in the range of 60% to 65%. Wall motion  was normal; there were no regional wall motion abnormalities. - Aortic valve: Mildly calcified annulus. Mildly thickened leaflets. Valve area (VTI): 1.76 cm^2. Valve area (Vmax): 2.64 cm^2. Valve area (Vmean): 2.33 cm^2. - Mitral valve: Mildly calcified annulus. Mildly thickened leaflets . Difficult views of the MV leaflets. 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 The findings are consistent with moderate to severe stenosis. Mean gradient (D): 10 mm Hg. Valve area by pressure half-time: 1.42 cm^2. - Left atrium: The atrium was severely dilated. Consider TEE for further evaluatin of MV. - Right ventricle: The cavity size was mildly dilated. - Right atrium: The atrium was severely dilated. - Tricuspid valve: There was moderate-severe regurgitation. The TR vena contractia is 0.5 cm. The severity may be underestimated based on the  eccentricity of the jet. Suggestion of hepatic systolic flow reversal suggests more severe TR. - Pulmonary arteries: Systolic pressure was severely increased. PA peak pressure: 89 mm Hg (S). - Inferior vena cava: The vessel was dilated. The respirophasic diameter changes were blunted (< 50%), consistent with elevated central venous pressure. - Technically difficult study.   Assessment and Plan  1. PAF - no recent symptoms - continue current meds including coumadin for stroke preventoin  2. CAD - nonobstructive by cath, continue medical therapy  3. Mitral stenosis - extensive workup in 2013, overall mild to moderate - repeat echo shows severe gradient of 10 mmHg, moderate by MVA PHT. Also with moderate to severe TR.  - given worsening SOB, edema, as well as pulmonary HTN and some right sided dysfunction will repeat echo to better evaluate MV. May needs repeat RHC as well pending TEE results.  - increase lasix to  bid  4. Chronic diastolic HF - volume overloaded. Increase lasix to  bid. Check BMET and Mt in 2 weeks.     F/u 1 month  Antoine Poche, M.D.

## 2015-06-05 NOTE — Patient Instructions (Signed)
Your physician recommends that you schedule a follow-up appointment in: 1 month with Dr. Wyline Mood  Your physician has recommended you make the following change in your medication:   INCREASE LASIX 40 MG TWICE DAILY  Your physician recommends that you return for lab work in: 2 WEEKS BMP/MG  Your physician has requested that you have a TEE. During a TEE, sound waves are used to create images of your heart. It provides your doctor with information about the size and shape of your heart and how well your heart's chambers and valves are working. In this test, a transducer is attached to the end of a flexible tube that's guided down your throat and into your esophagus (the tube leading from you mouth to your stomach) to get a more detailed image of your heart. You are not awake for the procedure. Please see the instruction sheet given to you today. For further information please visit [email or web address]  Thank you for choosing [facility]!!

## 2015-06-14 ENCOUNTER — Encounter: Payer: Self-pay | Admitting: *Deleted

## 2015-06-19 ENCOUNTER — Encounter: Payer: Self-pay | Admitting: *Deleted

## 2015-06-24 ENCOUNTER — Other Ambulatory Visit: Payer: Self-pay | Admitting: Cardiology

## 2015-06-24 DIAGNOSIS — I050 Rheumatic mitral stenosis: Secondary | ICD-10-CM

## 2015-06-25 ENCOUNTER — Ambulatory Visit (HOSPITAL_COMMUNITY)
Admission: RE | Admit: 2015-06-25 | Discharge: 2015-06-25 | Disposition: A | Discharge status: Home / Self Care | Payer: Medicare Other | Source: Ambulatory Visit | Attending: Cardiology | Admitting: Cardiology

## 2015-06-25 ENCOUNTER — Ambulatory Visit (HOSPITAL_BASED_OUTPATIENT_CLINIC_OR_DEPARTMENT_OTHER): Payer: Medicare Other

## 2015-06-25 ENCOUNTER — Encounter (HOSPITAL_COMMUNITY): Payer: Self-pay

## 2015-06-25 ENCOUNTER — Encounter (HOSPITAL_COMMUNITY)
Admission: RE | Disposition: A | Discharge status: Home / Self Care | Payer: Self-pay | Source: Ambulatory Visit | Attending: Cardiology

## 2015-06-25 DIAGNOSIS — I050 Rheumatic mitral stenosis: Secondary | ICD-10-CM | POA: Insufficient documentation

## 2015-06-25 DIAGNOSIS — Z6841 Body Mass Index (BMI) 40.0 and over, adult: Secondary | ICD-10-CM | POA: Insufficient documentation

## 2015-06-25 DIAGNOSIS — I342 Nonrheumatic mitral (valve) stenosis: Secondary | ICD-10-CM

## 2015-06-25 DIAGNOSIS — I480 Paroxysmal atrial fibrillation: Secondary | ICD-10-CM | POA: Insufficient documentation

## 2015-06-25 DIAGNOSIS — I081 Rheumatic disorders of both mitral and tricuspid valves: Secondary | ICD-10-CM | POA: Diagnosis not present

## 2015-06-25 DIAGNOSIS — Z79899 Other long term (current) drug therapy: Secondary | ICD-10-CM | POA: Diagnosis not present

## 2015-06-25 DIAGNOSIS — Z7901 Long term (current) use of anticoagulants: Secondary | ICD-10-CM | POA: Insufficient documentation

## 2015-06-25 DIAGNOSIS — E785 Hyperlipidemia, unspecified: Secondary | ICD-10-CM | POA: Insufficient documentation

## 2015-06-25 DIAGNOSIS — E039 Hypothyroidism, unspecified: Secondary | ICD-10-CM | POA: Diagnosis not present

## 2015-06-25 DIAGNOSIS — I10 Essential (primary) hypertension: Secondary | ICD-10-CM | POA: Diagnosis not present

## 2015-06-25 HISTORY — PX: TEE WITHOUT CARDIOVERSION: SHX5443

## 2015-06-25 SURGERY — ECHOCARDIOGRAM, TRANSESOPHAGEAL
Anesthesia: Moderate Sedation

## 2015-06-25 MED ORDER — MIDAZOLAM HCL 5 MG/5ML IJ SOLN
INTRAMUSCULAR | Status: AC
  Filled 2015-06-25: qty 5

## 2015-06-25 MED ORDER — FENTANYL CITRATE (PF) 100 MCG/2ML IJ SOLN
INTRAMUSCULAR | Status: DC | PRN
  Administered 2015-06-25: 25 ug via INTRAVENOUS
  Administered 2015-06-25: 50 ug via INTRAVENOUS
  Administered 2015-06-25: 25 ug via INTRAVENOUS

## 2015-06-25 MED ORDER — LIDOCAINE VISCOUS 2 % MT SOLN
OROMUCOSAL | Status: AC
  Filled 2015-06-25: qty 15

## 2015-06-25 MED ORDER — BUTAMBEN-TETRACAINE-BENZOCAINE 2-2-14 % EX AERO
INHALATION_SPRAY | CUTANEOUS | Status: DC | PRN
  Administered 2015-06-25: 2 via TOPICAL

## 2015-06-25 MED ORDER — FENTANYL CITRATE (PF) 100 MCG/2ML IJ SOLN
INTRAMUSCULAR | Status: AC
  Filled 2015-06-25: qty 2

## 2015-06-25 MED ORDER — MIDAZOLAM HCL 5 MG/5ML IJ SOLN
INTRAMUSCULAR | Status: DC | PRN
  Administered 2015-06-25 (×2): 0.5 mg via INTRAVENOUS
  Administered 2015-06-25: 1 mg via INTRAVENOUS

## 2015-06-25 MED ORDER — SODIUM CHLORIDE 0.9 % IV SOLN
INTRAVENOUS | Status: DC
  Administered 2015-06-25: 08:00:00 via INTRAVENOUS

## 2015-06-25 MED ORDER — LIDOCAINE VISCOUS 2 % MT SOLN
OROMUCOSAL | Status: DC | PRN
  Administered 2015-06-25: 1 via OROMUCOSAL

## 2015-06-25 NOTE — H&P (Signed)
Please see recent documented clinic note below for full history. Patient preents today for TEE to evaluate mitral stenosis in the setting of worsening dyspnea on exertion.   Ann Ferry MD   Clinical Summary Ms. Secrest is a 74 y.o.female seen today for follow up of the following medical problems.   1. PAF - no recent palpitations. Compliant with lopressor for rate control - on coumadin for stroke prevention  2. 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 Mitral stenosis - mild to moderate overall based on TTE, TEE, and cath in 2013 - reports worsening SOB/DOE and LE edema over the last few months. Significant weight gain - since last visit repeated echo that showed severe mean grad across MV of 10 mmHg, calculated area by PHT 1.4. Severely elevated PASP at 89 mmHg, moderate to severe TR. Mildly dilated RV, normal RV function.   - weight 254 to 251 lbs since last visit. She reports baseline is 240 lbs.   4. Chronic diastolic heart failure - symptoms as described above. Repeat echo showed normal LVEF, could not evaluate diastolic dysfunction. Severe biatrial enlargement suggests significant dysfunction - compliant with diuretic   Past Medical History  Diagnosis Date  . Atrial fibrillation   . CHF (congestive heart failure)   . Lower extremity edema   . Mitral stenosis     Mild, by catheterization 4/13  . Pulmonary hypertension     Moderate, by catheterization 4/13  . CAD (coronary artery disease)     Nonobstructive, by catheterization 4/13  . Hypothyroidism   . HTN (hypertension)   . Morbid obesity   . HLD (hyperlipidemia)      No Known Allergies   Current Outpatient Prescriptions  Medication Sig Dispense Refill  . acetaminophen-codeine (TYLENOL #3) 300-30 MG per tablet Take 1 tablet by mouth as needed.     Marland Kitchen albuterol (PROVENTIL HFA;VENTOLIN HFA) 108 (90 BASE) MCG/ACT inhaler Inhale 2  puffs into the lungs every 6 (six) hours as needed. For shortness of breath    . albuterol (PROVENTIL) (2.5 MG/3ML) 0.083% nebulizer solution Take 2.5 mg by nebulization every 6 (six) hours as needed.    Marland Kitchen alendronate (FOSAMAX) 70 MG tablet Take 1 tablet by mouth Once a week.    . Fluticasone-Salmeterol (ADVAIR) 250-50 MCG/DOSE AEPB Inhale 1 puff into the lungs 2 (two) times daily.    . furosemide (LASIX) 40 MG tablet Take 60 mg (1 1/2 tab) in the AM and 40 mg in the PM for 4 days then 1 1/2 tabs daily 90 tablet 3  . levothyroxine (SYNTHROID, LEVOTHROID) 50 MCG tablet Take 50 mcg by mouth daily.    Marland Kitchen loratadine (CLARITIN) 10 MG tablet Take 10 mg by mouth as needed.     . metoprolol tartrate (LOPRESSOR) 25 MG tablet Take 0.5 tablets (12.5 mg total) by mouth 2 (two) times daily. 145 tablet 3  . montelukast (SINGULAIR) 10 MG tablet Take 10 mg by mouth at bedtime.    . potassium chloride SA (K-DUR,KLOR-CON) 20 MEQ tablet Take 1 tablet (20 mEq total) by mouth daily. 30 tablet 6  . rivaroxaban (XARELTO) 20 MG TABS tablet Take 20 mg by mouth daily with supper.    . rosuvastatin (CRESTOR) 10 MG tablet Take 10 mg by mouth daily.    Marland Kitchen warfarin (COUMADIN) 5 MG tablet Take 5 mg by mouth daily.      No current facility-administered medications for this visit.     No past  surgical history on file.   No Known Allergies    Family History  Problem Relation Age of Onset  . Heart disease Father 72  . Deep vein thrombosis Father   . Cancer Mother     right breast   . Cancer Sister     brain cancer  . Stomach cancer Brother      Social History Ms. Ebling reports that she quit smoking about 33 years ago. Her smoking use included Cigarettes. She has a 8 pack-year smoking history. She has never used smokeless tobacco. Ms. Sleep reports that she does not drink alcohol.   Review of  Systems CONSTITUTIONAL: No weight loss, fever, chills, weakness or fatigue.  HEENT: Eyes: No visual loss, blurred vision, double vision or yellow sclerae.No hearing loss, sneezing, congestion, runny nose or sore throat.  SKIN: No rash or itching.  CARDIOVASCULAR: per HPI RESPIRATORY: No cough or sputum.  GASTROINTESTINAL: No anorexia, nausea, vomiting or diarrhea. No abdominal pain or blood.  GENITOURINARY: No burning on urination, no polyuria NEUROLOGICAL: No headache, dizziness, syncope, paralysis, ataxia, numbness or tingling in the extremities. No change in bowel or bladder control.  MUSCULOSKELETAL: No muscle, back pain, joint pain or stiffness.  LYMPHATICS: No enlarged nodes. No history of splenectomy.  PSYCHIATRIC: No history of depression or anxiety.  ENDOCRINOLOGIC: No reports of sweating, cold or heat intolerance. No polyuria or polydipsia.  Marland Kitchen   Physical Examination Filed Vitals:   06/05/15 1128  BP: 121/75  Pulse: 90   Filed Vitals:   06/05/15 1128  Height: 5' (1.524 m)  Weight: 251 lb (113.853 kg)    Gen: resting comfortably, no acute distress HEENT: no scleral icterus, pupils equal round and reactive, no palptable cervical adenopathy,  CV: RRR, 2/6 systolic murmur LLSB, elevated JVD to angle of jaw Resp: Clear to auscultation bilaterally GI: abdomen is soft, non-tender, non-distended, normal bowel sounds, no hepatosplenomegaly MSK: extremities are warm, 1+ bilateral edema.  Skin: warm, no rash Neuro: no focal deficits Psych: appropriate affect   Diagnostic Studies 12/2011 TEE LVEF 55-60%, mod to severe LAE with spontaneous echocontrast, mild MR, moderate MS MVA 1.5 with mean grad 5 mmHg,    12/2011 Echo LVEF 55-60%, mod MS with mean grad 8,   12/2011 [facility] Procedural Findings: Hemodynamics RA 14 RV 48/13 PA 50/18 mean 33 PCWP v wave 29, mean 18 LV 119/19 AO 124/62, mean 88  Oxygen saturations: PA 67 AO 98 SVC  71  Cardiac Output (Fick) 5.2  Cardiac Index (Fick) 2.5  Coronary angiography: Coronary dominance: right  Left mainstem: Mildly calcified, no obstructive disease  Left anterior descending (LAD): Reaches the LV apex, moderate caliber vessel without significant obstructive disease  Left circumflex (LCx): Moderate caliber vessel without significant disease  Right coronary artery (RCA): Large, dominant vessel. Gives off PDA Blanchie Zeleznik. Mild irregularity but no significant obstructive disease  Left ventriculography: Deferred because of reduced creatinine clearance.  Final Conclusions:  1. Minor nonobstructive CAD 2. Moderate pulmonary HTN suspect primarily due to left heart disease (transpulmonic gradient , PVR 2.7) 3. Mild mitral stenosis  Recommendations: Careful hemodynamic study was done with simultaneous LV/PCWP tracing and I don't appreciate typical features of hemodynamically significant mitral stenosis. There was a large V wave in the wedge tracing which could be suggestive of a noncompliant LV or mitral regurgitation. I will review with Dr Andee Lineman.   04/2015 Echo Study Conclusions  - Left ventricle: The cavity size was normal. Wall thickness was normal. Systolic function was normal. The  estimated ejection fraction was in the range of 60% to 65%. Wall motion was normal; there were no regional wall motion abnormalities. - Aortic valve: Mildly calcified annulus. Mildly thickened leaflets. Valve area (VTI): 1.76 cm^2. Valve area (Vmax): 2.64 cm^2. Valve area (Vmean): 2.33 cm^2. - Mitral valve: Mildly calcified annulus. Mildly thickened leaflets . Difficult views of the MV leaflets. 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 The findings are consistent with moderate to severe stenosis. Mean gradient (D): 10 mm Hg. Valve area by pressure half-time: 1.42 cm^2. - Left atrium: The atrium was severely dilated.  Consider TEE for further evaluatin of MV. - Right ventricle: The cavity size was mildly dilated. - Right atrium: The atrium was severely dilated. - Tricuspid valve: There was moderate-severe regurgitation. The TR vena contractia is 0.5 cm. The severity may be underestimated based on the eccentricity of the jet. Suggestion of hepatic systolic flow reversal suggests more severe TR. - Pulmonary arteries: Systolic pressure was severely increased. PA peak pressure: 89 mm Hg (S). - Inferior vena cava: The vessel was dilated. The respirophasic diameter changes were blunted (< 50%), consistent with elevated central venous pressure. - Technically difficult study.   Assessment and Plan  1. PAF - no recent symptoms - continue current meds including coumadin for stroke preventoin  2. CAD - nonobstructive by cath, continue medical therapy  3. Mitral stenosis - extensive workup in 2013, overall mild to moderate - repeat echo shows severe gradient of 10 mmHg, moderate by MVA PHT. Also with moderate to severe TR.  - given worsening SOB, edema, as well as pulmonary HTN and some right sided dysfunction will repeat echo to better evaluate MV. May needs repeat RHC as well pending TEE results.  - increase lasix to  bid  4. Chronic diastolic HF - volume overloaded. Increase lasix to  bid. Check BMET and Mt in 2 weeks.     F/u 1 month

## 2015-06-25 NOTE — Procedures (Signed)
Procedure: TEE Physician: Dr Dina Rich MD Indication: mitral stenosis   The patient was brought to the endoscopy suite after appropriate consent was obtained. The posterior oropharynx was aneshtesized with 2% viscous lidocaine and cetacaine spray. Moderate sedation was achieved with 2 mg of versed and 100 mcg of fentanyl. Cardiopulmonary monitoring was performed thoughtout the procedure. The TEE probe was intubated into the esophagus without difficullty and several views were taken. Please refer to full TEE report for results. There is at least moderate mitral stenosis and moderate to severe TR.   Dominga Ferry MD

## 2015-06-25 NOTE — Discharge Instructions (Signed)
Care After Refer to this sheet in the next few weeks. These instructions provide you with information on caring for yourself after your procedure. Your caregiver may also give you more specific instructions. Your treatment has been planned according to current medical practices, but problems sometimes occur. Call your caregiver if you have any problems or questions after your procedure.  HOME CARE INSTRUCTIONS  Do not eat or drink anything until the numbing medicine (local anesthetic) has worn off and your gag reflex has returned. You will know that the local anesthetic has worn off when you can swallow comfortably.  Do not drive for 12 hours after the procedure or as directed by your caregiver.  Only take medicines as directed by your caregiver. SEEK MEDICAL CARE IF:   You cannot stop coughing.  You are not urinating at all or less than usual. SEEK IMMEDIATE MEDICAL CARE IF:  You have difficulty swallowing.  You cannot eat or drink.  You have worsening throat or chest pain.  You have dizziness, lightheadedness, or you faint.  You have nausea or vomiting.  You have chills.  You have a fever.  You have severe abdominal pain.  You have black, tarry, or bloody stools. Document Released: 08/31/2012 Document Reviewed: 08/31/2012 [facility] Patient Information 2015 [place], [place]. This information is not intended to replace advice given to you by your health care provider. Make sure you discuss any questions you have with your health care provider.   Mitral Stenosis Mitral stenosis is a narrowing of the mitral valve. This is the valve between the upper (atrium) and lower (ventricle) chambers of the left side of the heart. Mitral stenosis is often discovered when your caregiver hears an abnormal sound (heart murmur) while listening to your heart.  CAUSES  Rheumatic fever (a complication of strep infection).  Buildup of calcium around the valve (can occur with aging).  Birth  defect.  Damage from toxic medicines or radiation to the chest.  Inflammation of various tissues of the body (systemic lupus erythematosus).  Fabry's disease.  Whipple disease. SYMPTOMS  Symptoms of mitral stenosis include:  Shortness of breath.  Cough.  Wheezing.  Decreased energy or fatigue.  A fast or irregular heartbeat (palpitations).  Chest pain.  Pain in the arm, neck, jaw, or face.  Swollen feet or ankles. When babies are born with mitral stenosis, they usually develop symptoms within the first two years of life. These symptoms may include:  A blue color in their finger tips or toenails.  Shortness of breath.  Decreased energy.  Slow growth. DIAGNOSIS  Tests to diagnose mitral stenosis include:  Echocardiogram. This test takes an ultrasound picture of the heart. It allows your caregiver to see how the heart valves work when your heart beats.  Electrocardiogram (EKG). This test records electrical activity in the heart.  Cardiac catheterization. This test looks at the structure and function of the heart. A tube device (catheter) is passed through the blood vessels and into the heart. Dye is injected into the blood vessels, allowing images to be taken of the cardiac system.  Chest X-ray. TREATMENT  Treatment for mitral stenosis depends on the severity of the condition. It may include:  Medicines to keep the heart rate regular.  Blood thinners (anticoagulants) to prevent the formation of blood clots.  Medicines (antibiotics) that kill germs to prevent infections. Defective heart valves are more prone to infection.  Open heart surgery may be needed to repair or replace the mitral valve. HOME CARE INSTRUCTIONS  Do not smoke.  Achieve and maintain a healthy weight.  Decrease your caffeine and alcohol intake. Both of these substances can affect your heart's rate and rhythm.  Find out what kind of exercise you are allowed to participate in.  Eat a  heart healthy diet. A dietician can help you make healthy food choices.  Tell your dentist or doctor that you have mitral stenosis before any dental procedures. Antibiotics may be given to prevent a heart infection.  If you are a woman of childbearing age and have mitral stenosis, talk to your caregiver before becoming pregnant. SEEK IMMEDIATE MEDICAL CARE IF:   You have chest pain or pressure that does not go away.  You develop difficulty breathing.  You develop palpitations.  You develop increased fatigue.  You have swelling in your feet, ankles, or legs. MAKE SURE YOU:   Understand these instructions.  Will watch your condition.  Will get help right away if you are not doing well or get worse. Document Released: 03/04/2010 Document Revised: 09/19/2013 Document Reviewed: 12/20/2013 [facility] Patient Information 2015 [place], [place]. This information is not intended to replace advice given to you by your health care provider. Make sure you discuss any questions you have with your health care provider.

## 2015-06-26 ENCOUNTER — Telehealth: Payer: Self-pay | Admitting: *Deleted

## 2015-06-26 ENCOUNTER — Encounter (HOSPITAL_COMMUNITY): Payer: Self-pay | Admitting: Cardiology

## 2015-06-26 NOTE — Telephone Encounter (Signed)
-----   Message from Antoine Poche, MD sent at 06/26/2015 11:20 AM EDT ----- TEE shows that her mitral valve remains moderately stiff, overall unchanged since last study. She has another valve (tricuspid valve) that is very leaky, and some of the pressures on the right side of the heart are increased compared to her last test. This could be causing some of her symptoms. She needs to see me in 2-3 weeks to discuss in full detail and discuss potential further testing.  Dominga Ferry MD

## 2015-06-28 ENCOUNTER — Encounter: Payer: Self-pay | Admitting: *Deleted

## 2015-06-28 NOTE — Telephone Encounter (Signed)
Letter mailed to patient.

## 2015-07-03 ENCOUNTER — Ambulatory Visit (INDEPENDENT_AMBULATORY_CARE_PROVIDER_SITE_OTHER): Payer: Medicare Other | Admitting: Cardiology

## 2015-07-03 ENCOUNTER — Encounter: Payer: Self-pay | Admitting: Cardiology

## 2015-07-03 ENCOUNTER — Encounter: Payer: Self-pay | Admitting: *Deleted

## 2015-07-03 VITALS — BP 141/64 | HR 89 | Ht 60.0 in | Wt 236.4 lb

## 2015-07-03 DIAGNOSIS — I272 Other secondary pulmonary hypertension: Secondary | ICD-10-CM

## 2015-07-03 DIAGNOSIS — I10 Essential (primary) hypertension: Secondary | ICD-10-CM | POA: Diagnosis not present

## 2015-07-03 DIAGNOSIS — I5032 Chronic diastolic (congestive) heart failure: Secondary | ICD-10-CM

## 2015-07-03 DIAGNOSIS — I480 Paroxysmal atrial fibrillation: Secondary | ICD-10-CM

## 2015-07-03 DIAGNOSIS — I050 Rheumatic mitral stenosis: Secondary | ICD-10-CM

## 2015-07-03 MED ORDER — RIVAROXABAN 20 MG PO TABS: 20.0000 mg | ORAL_TABLET | Freq: Every day | ORAL | Status: DC

## 2015-07-03 MED ORDER — DILTIAZEM HCL 30 MG PO TABS: 30.0000 mg | ORAL_TABLET | Freq: Two times a day (BID) | ORAL | Status: AC

## 2015-07-03 NOTE — Patient Instructions (Signed)
Your physician recommends that you schedule a follow-up appointment in: 3-4 WEEKS WITH DR. BRANCH  Your physician has recommended you make the following change in your medication:   STOP LOPRESSOR   START DILTIAZEM 30 MG TWICE DAILY  WE HAVE GIVEN YOU SAMPLES OF XARELTO   Your physician recommends that you return for lab work NOW BMP/MG  Thank you for choosing [facility]!!

## 2015-07-03 NOTE — Progress Notes (Signed)
Patient ID: Ann Potter, female   DOB: June 07, 1941, 74 y.o.   MRN: [id number]     Clinical Summary Ann Potter is a 74 y.o.female seen today for follow up of the following medical problems. This is a focused visit on her history of mitral stenosis and chronic diastolic HF.   1. Mitral stenosis - mild to moderate overall based on TTE, TEE, and cath in 2013 - reports worsening SOB/DOE and LE edema over the last few months. Significant weight gain -  Echo 04/2015 showed severe mean grad across MV of 10 mmHg, calculated area by PHT 1.4. Severely elevated PASP at 89 mmHg, moderate to severe TR. Mildly dilated RV, normal RV function.  - since last visit repeated TEE, showed moderate MS with mean grad 8 and area by PHT of 1.3. Severe TR, PASP at least 83 mmHg.   - since last visit weight is down from 251 lbs to 236 lbs. She reports significant improvement in her dyspnea. She is taking lasix  bid.      2. Chronic diastolic heart failure - compliant with diuretic - breathing improved since last visit. Taking lasix  bid, weight 251 lbs down to 236 lbs.    Past Medical History  Diagnosis Date  . Atrial fibrillation   . CHF (congestive heart failure)   . Lower extremity edema   . Mitral stenosis     Mild, by catheterization 4/13  . Pulmonary hypertension     Moderate, by catheterization 4/13  . CAD (coronary artery disease)     Nonobstructive, by catheterization 4/13  . Hypothyroidism   . HTN (hypertension)   . Morbid obesity   . HLD (hyperlipidemia)      Allergies  Allergen Reactions  . Relafen  [Nabumetone] Other (See Comments)    Bladder pain     Current Outpatient Prescriptions  Medication Sig Dispense Refill  . acetaminophen-codeine (TYLENOL #3) 300-30 MG per tablet Take 1 tablet by mouth every 6 (six) hours as needed.     Marland Kitchen albuterol (PROVENTIL HFA;VENTOLIN HFA) 108 (90 BASE) MCG/ACT inhaler Inhale 2 puffs into the lungs every 6 (six) hours as needed. For  shortness of breath    . albuterol (PROVENTIL) (2.5 MG/3ML) 0.083% nebulizer solution Take 2.5 mg by nebulization every 6 (six) hours as needed.    Marland Kitchen alendronate (FOSAMAX) 70 MG tablet Take 1 tablet by mouth Once a week.    . celecoxib (CELEBREX) 200 MG capsule Take 1 capsule by mouth 2 (two) times daily.  5  . Fluticasone-Salmeterol (ADVAIR) 250-50 MCG/DOSE AEPB Inhale 1 puff into the lungs 2 (two) times daily.    . furosemide (LASIX) 40 MG tablet Take 1 tablet (40 mg total) by mouth 2 (two) times daily. 180 tablet 3  . levothyroxine (SYNTHROID, LEVOTHROID) 50 MCG tablet Take 50 mcg by mouth daily.    Marland Kitchen loratadine (CLARITIN) 10 MG tablet Take 10 mg by mouth as needed.     . metoprolol tartrate (LOPRESSOR) 25 MG tablet Take 0.5 tablets (12.5 mg total) by mouth 2 (two) times daily. 145 tablet 3  . montelukast (SINGULAIR) 10 MG tablet Take 10 mg by mouth at bedtime.    . potassium chloride SA (K-DUR,KLOR-CON) 20 MEQ tablet Take 20 mEq by mouth daily.    . rivaroxaban (XARELTO) 20 MG TABS tablet Take 20 mg by mouth daily with supper.    . rosuvastatin (CRESTOR) 10 MG tablet Take 10 mg by mouth daily.     No current facility-administered  medications for this visit.     Past Surgical History  Procedure Laterality Date  . Multiple tooth extractions      at 74 years of age  . Tubal ligation      1984  . Coronary artery bypass graft      3 or 4 years ago  . Tee with cardioversion      3 year ago  . Tee without cardioversion N/A 06/25/2015    Procedure: TRANSESOPHAGEAL ECHOCARDIOGRAM (TEE);  Surgeon: Antoine Poche, MD;  Location: [facility];  Service: Cardiology;  Laterality: N/A;  moved to 8:30 - Terri calling pt & Bernie     Allergies  Allergen Reactions  . Relafen  [Nabumetone] Other (See Comments)    Bladder pain      Family History  Problem Relation Age of Onset  . Heart disease Father 19  . Deep vein thrombosis Father   . Cancer Mother     right breast   . Cancer  Sister     brain cancer  . Stomach cancer Brother      Social History Ann Potter reports that she quit smoking about 50 years ago. Her smoking use included Cigarettes. She started smoking about 55 years ago. She has a 5 pack-year smoking history. She has never used smokeless tobacco. Ann Potter reports that she does not drink alcohol.   Review of Systems CONSTITUTIONAL: No weight loss, fever, chills, weakness or fatigue.  HEENT: Eyes: No visual loss, blurred vision, double vision or yellow sclerae.No hearing loss, sneezing, congestion, runny nose or sore throat.  SKIN: No rash or itching.  CARDIOVASCULAR: no chest pain, no palpitations.  RESPIRATORY: No shortness of breath, cough or sputum.  GASTROINTESTINAL: No anorexia, nausea, vomiting or diarrhea. No abdominal pain or blood.  GENITOURINARY: No burning on urination, no polyuria NEUROLOGICAL: No headache, dizziness, syncope, paralysis, ataxia, numbness or tingling in the extremities. No change in bowel or bladder control.  MUSCULOSKELETAL: No muscle, back pain, joint pain or stiffness.  LYMPHATICS: No enlarged nodes. No history of splenectomy.  PSYCHIATRIC: No history of depression or anxiety.  ENDOCRINOLOGIC: No reports of sweating, cold or heat intolerance. No polyuria or polydipsia.  Marland Kitchen   Physical Examination Filed Vitals:   07/03/15 1145  BP: 141/64  Pulse: 89   Filed Vitals:   07/03/15 1145  Height: 5' (1.524 m)  Weight: 236 lb 6.4 oz (107.23 kg)    Gen: resting comfortably, no acute distress HEENT: no scleral icterus, pupils equal round and reactive, no palptable cervical adenopathy,  CV: irreg, no m/r/g, no jvd, no carotid bruits Resp: bilateral crackles GI: abdomen is soft, non-tender, non-distended, normal bowel sounds, no hepatosplenomegaly MSK: extremities are warm, no edema.  Skin: warm, no rash Neuro:  no focal deficits Psych: appropriate affect   Diagnostic Studies 12/2011 TEE LVEF 55-60%, mod  to severe LAE with spontaneous echocontrast, mild MR, moderate MS MVA 1.5 with mean grad 5 mmHg,    12/2011 Echo LVEF 55-60%, mod MS with mean grad 8,   12/2011 [facility] Procedural Findings: Hemodynamics RA 14 RV 48/13 PA 50/18 mean 33 PCWP v wave 29, mean 18 LV 119/19 AO 124/62, mean 88  Oxygen saturations: PA 67 AO 98 SVC 71  Cardiac Output (Fick) 5.2  Cardiac Index (Fick) 2.5  Coronary angiography: Coronary dominance: right  Left mainstem: Mildly calcified, no obstructive disease  Left anterior descending (LAD): Reaches the LV apex, moderate caliber vessel without significant obstructive disease  Left circumflex (LCx):  Moderate caliber vessel without significant disease  Right coronary artery (RCA): Large, dominant vessel. Gives off PDA Calista Crain. Mild irregularity but no significant obstructive disease  Left ventriculography: Deferred because of reduced creatinine clearance.  Final Conclusions:  1. Minor nonobstructive CAD 2. Moderate pulmonary HTN suspect primarily due to left heart disease (transpulmonic gradient , PVR 2.7) 3. Mild mitral stenosis  Recommendations: Careful hemodynamic study was done with simultaneous LV/PCWP tracing and I don't appreciate typical features of hemodynamically significant mitral stenosis. There was a large V wave in the wedge tracing which could be suggestive of a noncompliant LV or mitral regurgitation. I will review with Dr Andee Lineman.   04/2015 Echo Study Conclusions  - Left ventricle: The cavity size was normal. Wall thickness was normal. Systolic function was normal. The estimated ejection fraction was in the range of 60% to 65%. Wall motion was normal; there were no regional wall motion abnormalities. - Aortic valve: Mildly calcified annulus. Mildly thickened leaflets. Valve area (VTI): 1.76 cm^2. Valve area (Vmax): 2.64 cm^2. Valve area (Vmean): 2.33 cm^2. - Mitral valve: Mildly calcified annulus.  Mildly thickened leaflets . Difficult views of the MV leaflets. 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 The findings are consistent with moderate to severe stenosis. Mean gradient (D): 10 mm Hg. Valve area by pressure half-time: 1.42 cm^2. - Left atrium: The atrium was severely dilated. Consider TEE for further evaluatin of MV. - Right ventricle: The cavity size was mildly dilated. - Right atrium: The atrium was severely dilated. - Tricuspid valve: There was moderate-severe regurgitation. The TR vena contractia is 0.5 cm. The severity may be underestimated based on the eccentricity of the jet. Suggestion of hepatic systolic flow reversal suggests more severe TR. - Pulmonary arteries: Systolic pressure was severely increased. PA peak pressure: 89 mm Hg (S). - Inferior vena cava: The vessel was dilated. The respirophasic diameter changes were blunted (< 50%), consistent with elevated central venous pressure. - Technically difficult study.   05/2015 TEE Study Conclusions  - Left ventricle: The cavity size was normal. Wall thickness was normal. Systolic function was normal. The estimated ejection fraction was in the range of 55% to 60%. - Aortic valve: There was mild regurgitation. The AI vena contracta is 0.2 cm. - Mitral valve: The findings are consistent with moderate stenosis. There was mild regurgitation. Mean gradient (D): 8 mm Hg. Valve area by pressure half-time: 1.3 cm^2. - Left atrium: The atrium was severely dilated. No evidence of thrombus in the atrial cavity or appendage. - Right ventricle: The cavity size was moderately dilated. - Right atrium: The atrium was severely dilated. The interatrial septum bowes to the left at times consistent with elevated RA pressures. - Atrial septum: No defect or patent foramen ovale was identified. - Tricuspid valve: There was severe regurgitation. The TR  vena contracta is 0.7 cm. The TR is eccentric and poteriorally directed. - Pulmonary arteries: Systolic pressure was severely increased. PASP is at least 83 mmHg. IVC not visualized during this test, cannot estimate RA pressure. - Technically adequate study.    Assessment and Plan   1. PAF -poor compliance with lopressor, she reports fatigue after taking. - will change to dilt 30mg  bid - continue xarelto for stroke prevention  2. Mitral stenosis - moderate to severe range by imaging - continue to follow symptoms with diuresis. May need repeat RHC/LHC  3. Chronic diastolic HF - down 15 lbs since last visit with improved symptoms. Still with evidence of  volume overload.  - continue lasix 40mg  bid. Repeat BMET and Mg  4. Pulmonary HTN - moderate to severe by echo at 89 mmHg, this was in the setting of severe volume overload and diastolic HF - will need repeat assessment once diuresed, either by repeat echo or potentially cath.   5. 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 If ever considered for MV intervention would likely need TV intervened on as well.    F/yu 3-4 weeks.    Antoine Poche, M.D.

## 2015-07-05 ENCOUNTER — Encounter: Payer: Self-pay | Admitting: *Deleted

## 2015-07-05 NOTE — Telephone Encounter (Signed)
Patient informed during office visit on 07/03/15.

## 2015-07-08 ENCOUNTER — Encounter: Payer: Self-pay | Admitting: *Deleted

## 2015-07-11 ENCOUNTER — Encounter: Payer: Self-pay | Admitting: *Deleted

## 2015-07-11 ENCOUNTER — Telehealth: Payer: Self-pay | Admitting: *Deleted

## 2015-07-11 NOTE — Telephone Encounter (Signed)
-----   Message from Antoine PocheJonathan F Branch, MD sent at 07/09/2015  2:25 PM EDT ----- Labs look good, continue current meds  Dominga FerryJ Branch MD

## 2015-07-11 NOTE — Telephone Encounter (Signed)
Pt phone message says disconnected X 2 days, mailed letter

## 2015-07-26 ENCOUNTER — Ambulatory Visit (INDEPENDENT_AMBULATORY_CARE_PROVIDER_SITE_OTHER): Payer: Medicare Other | Admitting: Cardiology

## 2015-07-26 ENCOUNTER — Encounter: Payer: Self-pay | Admitting: Cardiology

## 2015-07-26 VITALS — BP 156/75 | HR 82 | Ht 60.0 in | Wt 246.0 lb

## 2015-07-26 DIAGNOSIS — I272 Other secondary pulmonary hypertension: Secondary | ICD-10-CM

## 2015-07-26 DIAGNOSIS — I050 Rheumatic mitral stenosis: Secondary | ICD-10-CM | POA: Diagnosis not present

## 2015-07-26 DIAGNOSIS — I5032 Chronic diastolic (congestive) heart failure: Secondary | ICD-10-CM | POA: Diagnosis not present

## 2015-07-26 DIAGNOSIS — I480 Paroxysmal atrial fibrillation: Secondary | ICD-10-CM | POA: Diagnosis not present

## 2015-07-26 MED ORDER — TORSEMIDE 20 MG PO TABS: 20.0000 mg | ORAL_TABLET | Freq: Two times a day (BID) | ORAL | Status: DC

## 2015-07-26 NOTE — Progress Notes (Signed)
Patient ID: Ann Potter, female   DOB: 12/27/1940, 74 y.o.   MRN: [id number]     Clinical Summary Ann Potter is a 74 y.o.female seen today for follow up of the following medical problems.   1. Mitral stenosis - mild to moderate overall based on TTE, TEE, and cath in 2013 - reports worsening SOB/DOE and LE edema over the last few months. Significant weight gain - Echo 04/2015 showed severe mean grad across MV of 10 mmHg, calculated area by PHT 1.4. Severely elevated PASP at 89 mmHg, moderate to severe TR. Mildly dilated RV, normal RV function.  - since last visit repeated TEE, showed moderate MS with mean grad 8 and area by PHT of 1.3. Severe TR, PASP at least 83 mmHg.   - weight was down 251 to 236 last visit, but back up to 246. She stopped weighing herself at home.  - labs 07/03/15 with Cr 1.19, K 4.2.   2. Chronic diastolic heart failure  -weight is up since last visit 10 lbs  - compliant with fluid pills. Reports decreased response to lasix.   3. PAF - last visit changed lopressor to diltiazem due to fatigue - tolerating dilt much better, fatigue has resolved  4. Pulmonary HTN - moderate to severe by echo at 89 mmHg, this was in the setting of severe volume overload and diastolic HF  5. 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 If ever considered for MV intervention would likely need TV intervened on as well.    Past Medical History  Diagnosis Date  . Atrial fibrillation (HCC)   . CHF (congestive heart failure) (HCC)   . Lower extremity edema   . Mitral stenosis     Mild, by catheterization 4/13  . Pulmonary hypertension (HCC)     Moderate, by catheterization 4/13  . CAD (coronary artery disease)     Nonobstructive, by catheterization 4/13  . Hypothyroidism   . HTN (hypertension)   . Morbid obesity (HCC)   . HLD (hyperlipidemia)      Allergies  Allergen Reactions  . Relafen  [Nabumetone] Other (See Comments)    Bladder pain      Current Outpatient Prescriptions  Medication Sig Dispense Refill  . acetaminophen-codeine (TYLENOL #3) 300-30 MG per tablet Take 1 tablet by mouth every 6 (six) hours as needed.     Marland Kitchen albuterol (PROVENTIL HFA;VENTOLIN HFA) 108 (90 BASE) MCG/ACT inhaler Inhale 2 puffs into the lungs every 6 (six) hours as needed. For shortness of breath    . albuterol (PROVENTIL) (2.5 MG/3ML) 0.083% nebulizer solution Take 2.5 mg by nebulization every 6 (six) hours as needed.    Marland Kitchen alendronate (FOSAMAX) 70 MG tablet Take 1 tablet by mouth Once a week.    . celecoxib (CELEBREX) 200 MG capsule Take 1 capsule by mouth 2 (two) times daily.  5  . diltiazem (CARDIZEM) 30 MG tablet Take 1 tablet (30 mg total) by mouth 2 (two) times daily. 180 tablet 3  . Fluticasone-Salmeterol (ADVAIR) 250-50 MCG/DOSE AEPB Inhale 1 puff into the lungs 2 (two) times daily.    . furosemide (LASIX) 40 MG tablet Take 1 tablet (40 mg total) by mouth 2 (two) times daily. 180 tablet 3  . levothyroxine (SYNTHROID, LEVOTHROID) 50 MCG tablet Take 50 mcg by mouth daily.    Marland Kitchen loratadine (CLARITIN) 10 MG tablet Take 10 mg by mouth as needed.     . montelukast (SINGULAIR) 10 MG tablet Take 10 mg by mouth at  bedtime.    . potassium chloride SA (K-DUR,KLOR-CON) 20 MEQ tablet Take 20 mEq by mouth daily.    . rivaroxaban (XARELTO) 20 MG TABS tablet Take 1 tablet (20 mg total) by mouth daily with supper. 21 tablet 0  . rosuvastatin (CRESTOR) 10 MG tablet Take 10 mg by mouth daily.     No current facility-administered medications for this visit.     Past Surgical History  Procedure Laterality Date  . Multiple tooth extractions      at 74 years of age  . Tubal ligation      1984  . Coronary artery bypass graft      3 or 4 years ago  . Tee with cardioversion      3 year ago  . Tee without cardioversion N/A 06/25/2015    Procedure: TRANSESOPHAGEAL ECHOCARDIOGRAM (TEE);  Surgeon: Antoine Poche, MD;  Location: [facility];  Service:  Cardiology;  Laterality: N/A;  moved to 8:30 - Terri calling pt & Bernie     Allergies  Allergen Reactions  . Relafen  [Nabumetone] Other (See Comments)    Bladder pain      Family History  Problem Relation Age of Onset  . Heart disease Father 45  . Deep vein thrombosis Father   . Cancer Mother     right breast   . Cancer Sister     brain cancer  . Stomach cancer Brother      Social History Ann Potter reports that she quit smoking about 50 years ago. Her smoking use included Cigarettes. She started smoking about 55 years ago. She has a 5 pack-year smoking history. She has never used smokeless tobacco. Ann Potter reports that she does not drink alcohol.   Review of Systems CONSTITUTIONAL: No weight loss, fever, chills, weakness or fatigue.  HEENT: Eyes: No visual loss, blurred vision, double vision or yellow sclerae.No hearing loss, sneezing, congestion, runny nose or sore throat.  SKIN: No rash or itching.  CARDIOVASCULAR: per HPI RESPIRATORY: No  cough or sputum.  GASTROINTESTINAL: No anorexia, nausea, vomiting or diarrhea. No abdominal pain or blood.  GENITOURINARY: No burning on urination, no polyuria NEUROLOGICAL: No headache, dizziness, syncope, paralysis, ataxia, numbness or tingling in the extremities. No change in bowel or bladder control.  MUSCULOSKELETAL: No muscle, back pain, joint pain or stiffness.  LYMPHATICS: No enlarged nodes. No history of splenectomy.  PSYCHIATRIC: No history of depression or anxiety.  ENDOCRINOLOGIC: No reports of sweating, cold or heat intolerance. No polyuria or polydipsia.  Marland Kitchen   Physical Examination Filed Vitals:   07/26/15 1127  BP: 156/75  Pulse: 82   Filed Vitals:   07/26/15 1127  Height: 5' (1.524 m)  Weight: 246 lb (111.585 kg)    Gen: resting comfortably, no acute distress HEENT: no scleral icterus, pupils equal round and reactive, no palptable cervical adenopathy,  CV: RRR, 3/6 systolic murmur LLSB, elevated  JVD Resp: Clear to auscultation bilaterally GI: abdomen is soft, non-tender, non-distended, normal bowel sounds, no hepatosplenomegaly MSK: extremities are warm, no edema.  Skin: warm, no rash Neuro:  no focal deficits Psych: appropriate affect   Diagnostic Studies 12/2011 TEE LVEF 55-60%, mod to severe LAE with spontaneous echocontrast, mild MR, moderate MS MVA 1.5 with mean grad 5 mmHg,    12/2011 Echo LVEF 55-60%, mod MS with mean grad 8,   12/2011 [facility] Procedural Findings: Hemodynamics RA 14 RV 48/13 PA 50/18 mean 33 PCWP v wave 29, mean 18 LV 119/19 AO 124/62,  mean 88  Oxygen saturations: PA 67 AO 98 SVC 71  Cardiac Output (Fick) 5.2  Cardiac Index (Fick) 2.5  Coronary angiography: Coronary dominance: right  Left mainstem: Mildly calcified, no obstructive disease  Left anterior descending (LAD): Reaches the LV apex, moderate caliber vessel without significant obstructive disease  Left circumflex (LCx): Moderate caliber vessel without significant disease  Right coronary artery (RCA): Large, dominant vessel. Gives off PDA Ann Potter. Mild irregularity but no significant obstructive disease  Left ventriculography: Deferred because of reduced creatinine clearance.  Final Conclusions:  1. Minor nonobstructive CAD 2. Moderate pulmonary HTN suspect primarily due to left heart disease (transpulmonic gradient 14mmHg, PVR 2.7) 3. Mild mitral stenosis  Recommendations: Careful hemodynamic study was done with simultaneous LV/PCWP tracing and I don't appreciate typical features of hemodynamically significant mitral stenosis. There was a large V wave in the wedge tracing which could be suggestive of a noncompliant LV or mitral regurgitation. I will review with Dr Andee LinemaneGent.   04/2015 Echo Study Conclusions  - Left ventricle: The cavity size was normal. Wall thickness was normal. Systolic function was normal. The estimated ejection fraction was in the range  of 60% to 65%. Wall motion was normal; there were no regional wall motion abnormalities. - Aortic valve: Mildly calcified annulus. Mildly thickened leaflets. Valve area (VTI): 1.76 cm^2. Valve area (Vmax): 2.64 cm^2. Valve area (Vmean): 2.33 cm^2. - Mitral valve: Mildly calcified annulus. Mildly thickened leaflets . Difficult views of the MV leaflets. 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 The findings are consistent with moderate to severe stenosis. Mean gradient (D): 10 mm Hg. Valve area by pressure half-time: 1.42 cm^2. - Left atrium: The atrium was severely dilated. Consider TEE for further evaluatin of MV. - Right ventricle: The cavity size was mildly dilated. - Right atrium: The atrium was severely dilated. - Tricuspid valve: There was moderate-severe regurgitation. The TR vena contractia is 0.5 cm. The severity may be underestimated based on the eccentricity of the jet. Suggestion of hepatic systolic flow reversal suggests more severe TR. - Pulmonary arteries: Systolic pressure was severely increased. PA peak pressure: 89 mm Hg (S). - Inferior vena cava: The vessel was dilated. The respirophasic diameter changes were blunted (< 50%), consistent with elevated central venous pressure. - Technically difficult study.   05/2015 TEE Study Conclusions  - Left ventricle: The cavity size was normal. Wall thickness was normal. Systolic function was normal. The estimated ejection fraction was in the range of 55% to 60%. - Aortic valve: There was mild regurgitation. The AI vena contracta is 0.2 cm. - Mitral valve: The findings are consistent with moderate stenosis. There was mild regurgitation. Mean gradient (D): 8 mm Hg. Valve area by pressure half-time: 1.3 cm^2. - Left atrium: The atrium was severely dilated. No evidence of thrombus in the atrial cavity or appendage. - Right ventricle: The cavity  size was moderately dilated. - Right atrium: The atrium was severely dilated. The interatrial septum bowes to the left at times consistent with elevated RA pressures. - Atrial septum: No defect or patent foramen ovale was identified. - Tricuspid valve: There was severe regurgitation. The TR vena contracta is 0.7 cm. The TR is eccentric and poteriorally directed. - Pulmonary arteries: Systolic pressure was severely increased. PASP is at least 83 mmHg. IVC not visualized during this test, cannot estimate RA pressure. - Technically adequate study.     Assessment and Plan  1. PAF -fatigue with lopressor, tolerating dilt better. - continue  xarelto for stroke prevention  2. Mitral stenosis - moderate to severe range by imaging - continue to follow symptoms with diuresis. May need repeat RHC/LHC once diuresed.   3. Chronic diastolic HF - weight up since last visit. She reports decreased response to lasix. Change to toresmide 20mg  bid.   4. Pulmonary HTN - moderate to severe by echo at 89 mmHg, this was in the setting of severe volume overload and diastolic HF - will need repeat assessment once diuresed, either by repeat echo or potentially cath.   5. 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 If ever considered for MV intervention would likely need TV intervened on as well.     F/u 3-4 weeks   Antoine Poche, M.D.

## 2015-07-26 NOTE — Patient Instructions (Addendum)
   Stop Lasix.  Begin Torsemide 20mg  twice a day  - [facility]. Continue all other medications.   Follow up in  3 weeks

## 2015-08-16 ENCOUNTER — Ambulatory Visit (INDEPENDENT_AMBULATORY_CARE_PROVIDER_SITE_OTHER): Payer: Medicare Other | Admitting: Cardiology

## 2015-08-16 ENCOUNTER — Encounter: Payer: Self-pay | Admitting: Cardiology

## 2015-08-16 VITALS — BP 134/82 | HR 71 | Ht 60.0 in | Wt 248.8 lb

## 2015-08-16 DIAGNOSIS — I5033 Acute on chronic diastolic (congestive) heart failure: Secondary | ICD-10-CM | POA: Diagnosis not present

## 2015-08-16 MED ORDER — TORSEMIDE 20 MG PO TABS: 60.0000 mg | ORAL_TABLET | Freq: Two times a day (BID) | ORAL | Status: DC

## 2015-08-16 MED ORDER — RIVAROXABAN 20 MG PO TABS: 20.0000 mg | ORAL_TABLET | Freq: Every day | ORAL | Status: DC

## 2015-08-16 MED ORDER — METOLAZONE 2.5 MG PO TABS: ORAL_TABLET | ORAL | Status: DC

## 2015-08-16 NOTE — Progress Notes (Signed)
Patient ID: Ann Potter, female   DOB: [birth year], 74 y.o.   MRN: [id number]     Clinical Summary Ann Potter is a 74 y.o.female seen today for follow up of the following medical problems. This is a focused visit on her history of chronic diastolic HF, for more detailed history please refer to previous clinic notes.    1. Acute on chronic diastolic heart failure  - weight was recently  down fom 251 to 236, however over the last few visits has continued to trend up along with increased dema.  - labs 07/03/15 with Cr 1.19, K 4.2.  - pcp increased torsemide to  bid however weight has continued to trend up.    Past Medical History  Diagnosis Date  . Atrial fibrillation (HCC)   . CHF (congestive heart failure) (HCC)   . Lower extremity edema   . Mitral stenosis     Mild, by catheterization 4/13  . Pulmonary hypertension (HCC)     Moderate, by catheterization 4/13  . CAD (coronary artery disease)     Nonobstructive, by catheterization 4/13  . Hypothyroidism   . HTN (hypertension)   . Morbid obesity (HCC)   . HLD (hyperlipidemia)      Allergies  Allergen Reactions  . Relafen  [Nabumetone] Other (See Comments)    Bladder pain     Current Outpatient Prescriptions  Medication Sig Dispense Refill  . acetaminophen-codeine (TYLENOL #3) 300-30 MG per tablet Take 1 tablet by mouth every 6 (six) hours as needed.     Marland Kitchen albuterol (PROVENTIL HFA;VENTOLIN HFA) 108 (90 BASE) MCG/ACT inhaler Inhale 2 puffs into the lungs every 6 (six) hours as needed. For shortness of breath    . albuterol (PROVENTIL) (2.5 MG/3ML) 0.083% nebulizer solution Take 2.5 mg by nebulization every 6 (six) hours as needed.    Marland Kitchen alendronate (FOSAMAX) 70 MG tablet Take 1 tablet by mouth Once a week.    . celecoxib (CELEBREX) 200 MG capsule Take 1 capsule by mouth 2 (two) times daily.  5  . diltiazem (CARDIZEM) 30 MG tablet Take 1 tablet (30 mg total) by mouth 2 (two) times daily. 180 tablet 3  .  Fluticasone-Salmeterol (ADVAIR) 250-50 MCG/DOSE AEPB Inhale 1 puff into the lungs 2 (two) times daily.    Marland Kitchen HYDROcodone-acetaminophen (NORCO/VICODIN) 5-325 MG tablet Take 1 tablet by mouth every 4 (four) hours as needed.  0  . levothyroxine (SYNTHROID, LEVOTHROID) 50 MCG tablet Take 50 mcg by mouth daily.    Marland Kitchen loratadine (CLARITIN) 10 MG tablet Take 10 mg by mouth as needed.     . montelukast (SINGULAIR) 10 MG tablet Take 10 mg by mouth at bedtime.    . potassium chloride SA (K-DUR,KLOR-CON) 20 MEQ tablet Take 20 mEq by mouth daily.    . rivaroxaban (XARELTO) 20 MG TABS tablet Take 1 tablet (20 mg total) by mouth daily with supper. 21 tablet 0  . rosuvastatin (CRESTOR) 10 MG tablet Take 10 mg by mouth daily.    Marland Kitchen torsemide (DEMADEX) 20 MG tablet Take 1 tablet (20 mg total) by mouth 2 (two) times daily. 60 tablet 6   No current facility-administered medications for this visit.     Past Surgical History  Procedure Laterality Date  . Multiple tooth extractions      at 74 years of age  . Tubal ligation      1984  . Coronary artery bypass graft      3 or 4 years ago  . Rhae Hammock  with cardioversion      3 year ago  . Tee without cardioversion N/A 06/25/2015    Procedure: TRANSESOPHAGEAL ECHOCARDIOGRAM (TEE);  Surgeon: Antoine Poche, MD;  Location: [facility];  Service: Cardiology;  Laterality: N/A;  moved to 8:30 - Terri calling pt & Bernie     Allergies  Allergen Reactions  . Relafen  [Nabumetone] Other (See Comments)    Bladder pain      Family History  Problem Relation Age of Onset  . Heart disease Father 33  . Deep vein thrombosis Father   . Cancer Mother     right breast   . Cancer Sister     brain cancer  . Stomach cancer Brother      Social History Ann Potter reports that she quit smoking about 50 years ago. Her smoking use included Cigarettes. She started smoking about 55 years ago. She has a 5 pack-year smoking history. She has never used smokeless  tobacco. Ann Potter reports that she does not drink alcohol.   Review of Systems CONSTITUTIONAL: No weight loss, fever, chills, weakness or fatigue.  HEENT: Eyes: No visual loss, blurred vision, double vision or yellow sclerae.No hearing loss, sneezing, congestion, runny nose or sore throat.  SKIN: No rash or itching.  CARDIOVASCULAR: per HPI RESPIRATORY: No cough or sputum.  GASTROINTESTINAL: No anorexia, nausea, vomiting or diarrhea. No abdominal pain or blood.  GENITOURINARY: No burning on urination, no polyuria NEUROLOGICAL: No headache, dizziness, syncope, paralysis, ataxia, numbness or tingling in the extremities. No change in bowel or bladder control.  MUSCULOSKELETAL: No muscle, back pain, joint pain or stiffness.  LYMPHATICS: No enlarged nodes. No history of splenectomy.  PSYCHIATRIC: No history of depression or anxiety.  ENDOCRINOLOGIC: No reports of sweating, cold or heat intolerance. No polyuria or polydipsia.  Marland Kitchen   Physical Examination Filed Vitals:   08/16/15 1509  BP: 134/82  Pulse: 71   Filed Vitals:   08/16/15 1509  Height: 5' (1.524 m)  Weight: 248 lb 12.8 oz (112.855 kg)    Gen: resting comfortably, no acute distress HEENT: no scleral icterus, pupils equal round and reactive, no palptable cervical adenopathy,  CV: RRR, no m/r/g Resp: Clear to auscultation bilaterally GI: abdomen is soft, non-tender, non-distended, normal bowel sounds, no hepatosplenomegaly MSK: extremities are warm, 2+ bilateral edema Skin: warm, no rash Neuro:  no focal deficits Psych: appropriate affect   Diagnostic Studies  12/2011 TEE LVEF 55-60%, mod to severe LAE with spontaneous echocontrast, mild MR, moderate MS MVA 1.5 with mean grad 5 mmHg,    12/2011 Echo LVEF 55-60%, mod MS with mean grad 8,   12/2011 [facility] Procedural Findings: Hemodynamics RA 14 RV 48/13 PA 50/18 mean 33 PCWP v wave 29, mean 18 LV 119/19 AO 124/62, mean 88  Oxygen saturations: PA 67 AO  98 SVC 71  Cardiac Output (Fick) 5.2  Cardiac Index (Fick) 2.5  Coronary angiography: Coronary dominance: right  Left mainstem: Mildly calcified, no obstructive disease  Left anterior descending (LAD): Reaches the LV apex, moderate caliber vessel without significant obstructive disease  Left circumflex (LCx): Moderate caliber vessel without significant disease  Right coronary artery (RCA): Large, dominant vessel. Gives off PDA branch. Mild irregularity but no significant obstructive disease  Left ventriculography: Deferred because of reduced creatinine clearance.  Final Conclusions:  1. Minor nonobstructive CAD 2. Moderate pulmonary HTN suspect primarily due to left heart disease (transpulmonic gradient , PVR 2.7) 3. Mild mitral stenosis  Recommendations: Careful hemodynamic study was  done with simultaneous LV/PCWP tracing and I don't appreciate typical features of hemodynamically significant mitral stenosis. There was a large V wave in the wedge tracing which could be suggestive of a noncompliant LV or mitral regurgitation. I will review with Dr Andee LinemaneGent.   04/2015 Echo Study Conclusions  - Left ventricle: The cavity size was normal. Wall thickness was normal. Systolic function was normal. The estimated ejection fraction was in the range of 60% to 65%. Wall motion was normal; there were no regional wall motion abnormalities. - Aortic valve: Mildly calcified annulus. Mildly thickened leaflets. Valve area (VTI): 1.76 cm^2. Valve area (Vmax): 2.64 cm^2. Valve area (Vmean): 2.33 cm^2. - Mitral valve: Mildly calcified annulus. Mildly thickened leaflets . Difficult views of the MV leaflets. 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 The findings are consistent with moderate to severe stenosis. Mean gradient (D): 10 mm Hg. Valve area by pressure half-time: 1.42 cm^2. - Left atrium: The atrium was severely  dilated. Consider TEE for further evaluatin of MV. - Right ventricle: The cavity size was mildly dilated. - Right atrium: The atrium was severely dilated. - Tricuspid valve: There was moderate-severe regurgitation. The TR vena contractia is 0.5 cm. The severity may be underestimated based on the eccentricity of the jet. Suggestion of hepatic systolic flow reversal suggests more severe TR. - Pulmonary arteries: Systolic pressure was severely increased. PA peak pressure: 89 mm Hg (S). - Inferior vena cava: The vessel was dilated. The respirophasic diameter changes were blunted (< 50%), consistent with elevated central venous pressure. - Technically difficult study.   05/2015 TEE Study Conclusions  - Left ventricle: The cavity size was normal. Wall thickness was normal. Systolic function was normal. The estimated ejection fraction was in the range of 55% to 60%. - Aortic valve: There was mild regurgitation. The AI vena contracta is 0.2 cm. - Mitral valve: The findings are consistent with moderate stenosis. There was mild regurgitation. Mean gradient (D): 8 mm Hg. Valve area by pressure half-time: 1.3 cm^2. - Left atrium: The atrium was severely dilated. No evidence of thrombus in the atrial cavity or appendage. - Right ventricle: The cavity size was moderately dilated. - Right atrium: The atrium was severely dilated. The interatrial septum bowes to the left at times consistent with elevated RA pressures. - Atrial septum: No defect or patent foramen ovale was identified. - Tricuspid valve: There was severe regurgitation. The TR vena contracta is 0.7 cm. The TR is eccentric and poteriorally directed. - Pulmonary arteries: Systolic pressure was severely increased. PASP is at least 83 mmHg. IVC not visualized during this test, cannot estimate RA pressure. - Technically adequate study.      Assessment and Plan  1. Acute on chronic diastolic  HF - weight and edema increasing despite increased doses of toresmide.  - will increase torsemide to 60mg  bid. She will take metolazone once a week x 4 weeks. She is to call us next week and update us on her weight and edema. BMET and Mg in 2 weeks.   F/u 1 month      Antoine PocheJonathan F. Branch, M.D.

## 2015-08-16 NOTE — Patient Instructions (Signed)
Your physician recommends that you schedule a follow-up appointment in: 1 MONTH WITH DR. BRANCH  Your physician has recommended you make the following change in your medication:   INCREASE TORSEMIDE 60 MG 2 TIMES DAILY  TAKE METOLAZONE 2.5 MG WEEKLY ON SATURDAYS FOR 4 WEEKS.  Your physician recommends that you return for lab work in: 2 WEEKS BMP/MG  PLEASE CALL US IN 1 WEEK AND UPDATE US ON YOUR WEIGHT.   Thank you for choosing [facility]!!

## 2015-08-26 ENCOUNTER — Telehealth: Payer: Self-pay | Admitting: *Deleted

## 2015-08-26 NOTE — Telephone Encounter (Signed)
Pt aware and will have labs next week

## 2015-08-26 NOTE — Telephone Encounter (Signed)
How many doses of the metolazone has she taken? Originally she was asked to take once a week for a month. She appears to have had a 10 lbs weight loss since our visit, I would have her take her metolazone only one more time before our next follow up and her labs.    Dominga FerryJ Natalie Mceuen MD

## 2015-08-26 NOTE — Telephone Encounter (Signed)
I would have her take just one additional dose then, would not do each week for 4 weeks as planned.   [name] MD

## 2015-08-26 NOTE — Telephone Encounter (Signed)
Pt has only taking 1 dose of metolazone since LOV, since pharmacy didn't have rx ready for pickup.

## 2015-08-26 NOTE — Telephone Encounter (Signed)
F/u 1 week with weight which 238 lbs as of today, pt pcp has started antibiotics (pt does not know what medication name is) for kidney infection. Pt says she will have labs done next week. Will forward to Dr. Wyline MoodBranch as Lorain ChildesFYI

## 2015-09-05 ENCOUNTER — Telehealth: Payer: Self-pay | Admitting: *Deleted

## 2015-09-05 DIAGNOSIS — N289 Disorder of kidney and ureter, unspecified: Secondary | ICD-10-CM

## 2015-09-05 NOTE — Telephone Encounter (Signed)
-----   Message from Antoine PocheJonathan F Branch, MD sent at 09/05/2015 12:57 PM EST ----- Labs show worsening kidney function, Please varify she stopped taking the metolazone. She will also need to not take her torsemide for 2 days. Repeat labs in 1 week with BMET and MG   Dominga FerryJ Branch MD

## 2015-09-05 NOTE — Telephone Encounter (Signed)
Pt aware and says she has stopped metolazone and will hol torsemide for 2 days. Will mail lab orders. Pt says she has been on antibiotics for 1 week and will continue them for 1 more week.

## 2015-09-11 ENCOUNTER — Encounter: Payer: Self-pay | Admitting: *Deleted

## 2015-09-16 ENCOUNTER — Telehealth: Payer: Self-pay | Admitting: *Deleted

## 2015-09-16 MED ORDER — TORSEMIDE 20 MG PO TABS: ORAL_TABLET | ORAL | Status: DC

## 2015-09-16 NOTE — Telephone Encounter (Signed)
-----   Message from Antoine PocheJonathan F Branch, MD sent at 09/13/2015 10:47 AM EST ----- Kidney function mildly improving since last check. I would decrease her toresmide to 60mg  in AM and 40mg  in PM for now. Will readdress at our f/u.  J BrancH MD

## 2015-09-16 NOTE — Telephone Encounter (Signed)
Pt aware and agreeable to torsemide decrease 60 AM and 40 PM until Jan f/u. Medication list updated

## 2015-10-01 ENCOUNTER — Ambulatory Visit (INDEPENDENT_AMBULATORY_CARE_PROVIDER_SITE_OTHER): Payer: Medicare Other | Admitting: Cardiology

## 2015-10-01 ENCOUNTER — Encounter: Payer: Self-pay | Admitting: Cardiology

## 2015-10-01 VITALS — BP 106/68 | HR 72 | Ht 60.0 in | Wt 253.0 lb

## 2015-10-01 DIAGNOSIS — I10 Essential (primary) hypertension: Secondary | ICD-10-CM | POA: Diagnosis not present

## 2015-10-01 DIAGNOSIS — I5033 Acute on chronic diastolic (congestive) heart failure: Secondary | ICD-10-CM | POA: Diagnosis not present

## 2015-10-01 MED ORDER — HYDROCHLOROTHIAZIDE 12.5 MG PO CAPS: ORAL_CAPSULE | ORAL | Status: DC

## 2015-10-01 MED ORDER — RIVAROXABAN 20 MG PO TABS: 20.0000 mg | ORAL_TABLET | Freq: Every day | ORAL | Status: AC

## 2015-10-01 NOTE — Patient Instructions (Signed)
Your physician recommends that you schedule a follow-up appointment in: 2 WEEKS WITH DR. BRANCH  Your physician has recommended you make the following change in your medication:   TAKE HCTZ 12.5 MG DAILY 30 MINUTES PRIOR TO YOUR MORNING DOSE OF TORSEMIDE   Your physician recommends that you return for lab work in: 1 WEEK BMP/MG  PLEASE CALL US ON Friday WITH AN UPDATE ON YOUR WEIGHT  [facility]  Thank you for choosing [facility]!!

## 2015-10-01 NOTE — Progress Notes (Signed)
Patient ID: Ann Potter, female   DOB: 27-Jan-1941, 75 y.o.   MRN: [id number]     Clinical Summary Ann Potter is a 75 y.o.female  seen today for follow up of the following medical problems. This is a focused visit on her history of chronic diastolic HF, for more detailed history please refer to previous clinic notes.    1. Acute on chronic diastolic heart failure  - weight and fluid status has been up and down. At her best she was down to 236 lbs. Last visit increased to 248 lbs, increaed LE edema. We started her on metolazone 2.5mg  once daily, after just 2 doses her home weights had decreased 10 lbs. Due to increase in Cr metolazone was stopped and contniue on torsemide 60mg  bid - since that time her weight is up 15 pounds, she did not unfortunately contact us when her weights initially begain to increase at home.       Past Medical History  Diagnosis Date  . Atrial fibrillation (HCC)   . CHF (congestive heart failure) (HCC)   . Lower extremity edema   . Mitral stenosis     Mild, by catheterization 4/13  . Pulmonary hypertension (HCC)     Moderate, by catheterization 4/13  . CAD (coronary artery disease)     Nonobstructive, by catheterization 4/13  . Hypothyroidism   . HTN (hypertension)   . Morbid obesity (HCC)   . HLD (hyperlipidemia)      Allergies  Allergen Reactions  . Relafen  [Nabumetone] Other (See Comments)    Bladder pain     Current Outpatient Prescriptions  Medication Sig Dispense Refill  . acetaminophen-codeine (TYLENOL #3) 300-30 MG per tablet Take 1 tablet by mouth every 6 (six) hours as needed.     Marland Kitchen albuterol (PROVENTIL HFA;VENTOLIN HFA) 108 (90 BASE) MCG/ACT inhaler Inhale 2 puffs into the lungs every 6 (six) hours as needed. For shortness of breath    . albuterol (PROVENTIL) (2.5 MG/3ML) 0.083% nebulizer solution Take 2.5 mg by nebulization every 6 (six) hours as needed.    Marland Kitchen alendronate (FOSAMAX) 70 MG tablet Take 1 tablet by mouth Once a  week.    . celecoxib (CELEBREX) 200 MG capsule Take 1 capsule by mouth 2 (two) times daily.  5  . diltiazem (CARDIZEM) 30 MG tablet Take 1 tablet (30 mg total) by mouth 2 (two) times daily. 180 tablet 3  . Fluticasone-Salmeterol (ADVAIR) 250-50 MCG/DOSE AEPB Inhale 1 puff into the lungs 2 (two) times daily.    Marland Kitchen HYDROcodone-acetaminophen (NORCO/VICODIN) 5-325 MG tablet Take 1 tablet by mouth every 4 (four) hours as needed.  0  . levothyroxine (SYNTHROID, LEVOTHROID) 50 MCG tablet Take 50 mcg by mouth daily.    Marland Kitchen loratadine (CLARITIN) 10 MG tablet Take 10 mg by mouth as needed.     . metolazone (ZAROXOLYN) 2.5 MG tablet TAKE 1 TAB WEEKLY ON SATURDAYS 4 tablet 0  . montelukast (SINGULAIR) 10 MG tablet Take 10 mg by mouth at bedtime.    . potassium chloride SA (K-DUR,KLOR-CON) 20 MEQ tablet Take 20 mEq by mouth daily.    . rivaroxaban (XARELTO) 20 MG TABS tablet Take 1 tablet (20 mg total) by mouth daily with supper. 30 tablet 0  . rosuvastatin (CRESTOR) 10 MG tablet Take 10 mg by mouth daily.    Marland Kitchen torsemide (DEMADEX) 20 MG tablet Take 60 mg in the AM and 40 mg in PM 180 tablet 3   No current facility-administered medications for this  visit.     Past Surgical History  Procedure Laterality Date  . Multiple tooth extractions      at 75 years of age  . Tubal ligation      1984  . Coronary artery bypass graft      3 or 4 years ago  . Tee with cardioversion      3 year ago  . Tee without cardioversion N/A 06/25/2015    Procedure: TRANSESOPHAGEAL ECHOCARDIOGRAM (TEE);  Surgeon: Antoine Poche, MD;  Location: [facility];  Service: Cardiology;  Laterality: N/A;  moved to 8:30 - Terri calling pt & Bernie     Allergies  Allergen Reactions  . Relafen  [Nabumetone] Other (See Comments)    Bladder pain      Family History  Problem Relation Age of Onset  . Heart disease Father 15  . Deep vein thrombosis Father   . Cancer Mother     right breast   . Cancer Sister     brain cancer    . Stomach cancer Brother      Social History Ann Potter reports that she quit smoking about 51 years ago. Her smoking use included Cigarettes. She started smoking about 55 years ago. She has a 5 pack-year smoking history. She has never used smokeless tobacco. Ms. Esses reports that she does not drink alcohol.   Review of Systems CONSTITUTIONAL: No weight loss, fever, chills, weakness or fatigue.  HEENT: Eyes: No visual loss, blurred vision, double vision or yellow sclerae.No hearing loss, sneezing, congestion, runny nose or sore throat.  SKIN: No rash or itching.  CARDIOVASCULAR: no chest pain, no palpitations.  RESPIRATORY: No shortness of breath, cough or sputum.  GASTROINTESTINAL: No anorexia, nausea, vomiting or diarrhea. No abdominal pain or blood.  GENITOURINARY: No burning on urination, no polyuria NEUROLOGICAL: No headache, dizziness, syncope, paralysis, ataxia, numbness or tingling in the extremities. No change in bowel or bladder control.  MUSCULOSKELETAL: No muscle, back pain, joint pain or stiffness.  LYMPHATICS: No enlarged nodes. No history of splenectomy.  PSYCHIATRIC: No history of depression or anxiety.  ENDOCRINOLOGIC: No reports of sweating, cold or heat intolerance. No polyuria or polydipsia.  Marland Kitchen   Physical Examination Filed Vitals:   10/01/15 1433  BP: 106/68  Pulse: 72   Filed Vitals:   10/01/15 1433  Height: 5' (1.524 m)  Weight: 253 lb (114.76 kg)    Gen: resting comfortably, no acute distress HEENT: no scleral icterus, pupils equal round and reactive, no palptable cervical adenopathy,  CV: RRR, no m/r/g, no jvd Resp: Clear to auscultation bilaterally GI: abdomen is soft, non-tender, non-distended, normal bowel sounds, no hepatosplenomegaly MSK: extremities are warm, 2+ bilateral LE edema  Skin: warm, no rash Neuro:  no focal deficits Psych: appropriate affect   Diagnostic Studies  12/2011 TEE LVEF 55-60%, mod to severe LAE with  spontaneous echocontrast, mild MR, moderate MS MVA 1.5 with mean grad 5 mmHg,    12/2011 Echo LVEF 55-60%, mod MS with mean grad 8,   12/2011 [facility] Procedural Findings: Hemodynamics RA 14 RV 48/13 PA 50/18 mean 33 PCWP v wave 29, mean 18 LV 119/19 AO 124/62, mean 88  Oxygen saturations: PA 67 AO 98 SVC 71  Cardiac Output (Fick) 5.2  Cardiac Index (Fick) 2.5  Coronary angiography: Coronary dominance: right  Left mainstem: Mildly calcified, no obstructive disease  Left anterior descending (LAD): Reaches the LV apex, moderate caliber vessel without significant obstructive disease  Left circumflex (LCx): Moderate caliber  vessel without significant disease  Right coronary artery (RCA): Large, dominant vessel. Gives off PDA Erykah Lippert. Mild irregularity but no significant obstructive disease  Left ventriculography: Deferred because of reduced creatinine clearance.  Final Conclusions:  1. Minor nonobstructive CAD 2. Moderate pulmonary HTN suspect primarily due to left heart disease (transpulmonic gradient , PVR 2.7) 3. Mild mitral stenosis  Recommendations: Careful hemodynamic study was done with simultaneous LV/PCWP tracing and I don't appreciate typical features of hemodynamically significant mitral stenosis. There was a large V wave in the wedge tracing which could be suggestive of a noncompliant LV or mitral regurgitation. I will review with Dr Andee Lineman.   04/2015 Echo Study Conclusions  - Left ventricle: The cavity size was normal. Wall thickness was normal. Systolic function was normal. The estimated ejection fraction was in the range of 60% to 65%. Wall motion was normal; there were no regional wall motion abnormalities. - Aortic valve: Mildly calcified annulus. Mildly thickened leaflets. Valve area (VTI): 1.76 cm^2. Valve area (Vmax): 2.64 cm^2. Valve area (Vmean): 2.33 cm^2. - Mitral valve: Mildly calcified annulus. Mildly thickened  leaflets . Difficult views of the MV leaflets. 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 The findings are consistent with moderate to severe stenosis. Mean gradient (D): 10 mm Hg. Valve area by pressure half-time: 1.42 cm^2. - Left atrium: The atrium was severely dilated. Consider TEE for further evaluatin of MV. - Right ventricle: The cavity size was mildly dilated. - Right atrium: The atrium was severely dilated. - Tricuspid valve: There was moderate-severe regurgitation. The TR vena contractia is 0.5 cm. The severity may be underestimated based on the eccentricity of the jet. Suggestion of hepatic systolic flow reversal suggests more severe TR. - Pulmonary arteries: Systolic pressure was severely increased. PA peak pressure: 89 mm Hg (S). - Inferior vena cava: The vessel was dilated. The respirophasic diameter changes were blunted (< 50%), consistent with elevated central venous pressure. - Technically difficult study.   05/2015 TEE Study Conclusions  - Left ventricle: The cavity size was normal. Wall thickness was normal. Systolic function was normal. The estimated ejection fraction was in the range of 55% to 60%. - Aortic valve: There was mild regurgitation. The AI vena contracta is 0.2 cm. - Mitral valve: The findings are consistent with moderate stenosis. There was mild regurgitation. Mean gradient (D): 8 mm Hg. Valve area by pressure half-time: 1.3 cm^2. - Left atrium: The atrium was severely dilated. No evidence of thrombus in the atrial cavity or appendage. - Right ventricle: The cavity size was moderately dilated. - Right atrium: The atrium was severely dilated. The interatrial septum bowes to the left at times consistent with elevated RA pressures. - Atrial septum: No defect or patent foramen ovale was identified. - Tricuspid valve: There was severe regurgitation. The TR vena contracta is  0.7 cm. The TR is eccentric and poteriorally directed. - Pulmonary arteries: Systolic pressure was severely increased. PASP is at least 83 mmHg. IVC not visualized during this test, cannot estimate RA pressure. - Technically adequate study.    Assessment and Plan   1. Acute on chronic diastolic HF - very significant weight loss initially with metolazone. Unfortunately she did not contact us when her weights begain to trend back up and now she is up 15 pounds. Significant bump in Cr with metolazone that is trending down. - will try adding HCTZ daily x 7 days as a more milder thiazide diuretic to potentiate the effects of her continued  torsemide. I have reemphasized the great importance of daily weights and to keep us informed about weight changes. She is to call in Friday with update on her weights.  - check BMET and Mg in 1 week F/u 2 weeks. [facility] and weight management.          Antoine PocheJonathan F. Chasyn Cinque, M.D.

## 2015-10-04 ENCOUNTER — Telehealth: Payer: Self-pay | Admitting: *Deleted

## 2015-10-04 MED ORDER — HYDROCHLOROTHIAZIDE 25 MG PO TABS: 25.0000 mg | ORAL_TABLET | Freq: Every day | ORAL | Status: DC

## 2015-10-04 NOTE — Telephone Encounter (Signed)
-----   Message from Antoine PocheJonathan F Branch, MD sent at 10/03/2015  1:44 PM EST ----- Can we call patient on Friday and see how her weights are doing?   J BrancH MD

## 2015-10-04 NOTE — Telephone Encounter (Signed)
F/u weight will forward to Dr. Wyline MoodBranch  Wednesday AM  246lbs  PM  245lbs Thursday  AM 247lbs  PM 244lbs Friday AM 246lbs PM 244lbs

## 2015-10-04 NOTE — Addendum Note (Signed)
Addended by: Burman NievesASHWORTH, Danyle Boening T on: 10/04/2015 01:56 PM   Modules accepted: Orders, Medications

## 2015-10-04 NOTE — Telephone Encounter (Signed)
Please have her increase her HCTZ to 25mg  daily for the next 7 days and then update us on her swelling and weight  Ann FerryJ Rayshawn Maney MD

## 2015-10-04 NOTE — Telephone Encounter (Signed)
Pt aware and will increase HCTZ 25 mg daily for 7 days. Will f/u

## 2015-10-07 ENCOUNTER — Other Ambulatory Visit: Payer: Self-pay | Admitting: *Deleted

## 2015-10-07 NOTE — Patient Outreach (Signed)
[facility] Care Management  10/07/2015  Sherle PoeGloria Barg 17-Jul-1941 [id number]  MD referral for consult -Medication/Heart failure  Telephone call to patient; left message on voice mail requesting return call.  Plan:  Will follow up. Colleen CanLinda Vonette Grosso, RN BSN CCM Care Management Coordinator [facility]  [phone]

## 2015-10-09 ENCOUNTER — Other Ambulatory Visit: Payer: Self-pay | Admitting: *Deleted

## 2015-10-09 DIAGNOSIS — I272 Pulmonary hypertension, unspecified: Secondary | ICD-10-CM

## 2015-10-09 DIAGNOSIS — I5032 Chronic diastolic (congestive) heart failure: Secondary | ICD-10-CM

## 2015-10-09 NOTE — Patient Outreach (Signed)
[facility] Care Management  10/09/2015  Sherle PoeGloria Franzel [birth year] [id number]  Referral from MD regarding home medication management and weight management -DX diastolic heart failure.  Telephone call to patient who was advised of reason for call & [facility].  HIPPA verification received from patient. Address verification: [address] [place], [place] [id number]. Patient states she currently has [facility] [employer] Medicare-[employer].   Patient voices that major health concern is her difficulty getting around when having problems with back pain. States history of  disc problems with back pain that causes problems with walking at times. States currently wears back binder all the time for support. States she uses walker as needed but not necessary all the time.  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 Voices that she has heart failure which was diagnosed about 5 years ago. States she weighs twice daily but could give not give symptoms & action plan of yellow & red zones of heart failure. Patient states she lives alone manages her own medications. States her sister assists her with taking her to doctors appointments. States [facility] because often her medications are changed.   [facility] to assist with medication management and heart failure management.   Referred to case management assistant to refer to community care coordinator for medication management and heart failure management.  Telephonic signing off.   Colleen CanLinda Chaska Hagger, RN BSN CCM Care Management Coordinator [facility] Care [facility]  [phone]

## 2015-10-11 ENCOUNTER — Encounter: Payer: Medicare Other | Admitting: Cardiology

## 2015-10-11 NOTE — Progress Notes (Signed)
Patient ID: Ann PoeGloria Potter, female   DOB: 1941/05/01, 75 y.o.   MRN: [id number]     Clinical Summary Ms. Crawshaw is a 75 y.o.female seen today for follow up of the following medical problems.   1. Acute on chronic diastolic heart failure  - weight and fluid status has been up and down. At her best she was down to 236 lbs. Last visit increased to 248 lbs, increaed LE edema. We started her on metolazone 2.5mg  once daily, after just 2 doses her home weights had decreased 10 lbs. Due to increase in Cr metolazone was stopped and contniue on torsemide 60mg  bid - since that time her weight is up 15 pounds, she did not unfortunately contact us when her weights initially begain to increase at home.   - last visit we tried adding HCTZ 12.5mg  daily, and later increased to 25mg  daily combined with her loop diuretic. We also [facility] help with [facility]F management.   Past Medical History  Diagnosis Date  . Atrial fibrillation (HCC)   . CHF (congestive heart failure) (HCC)   . Lower extremity edema   . Mitral stenosis     Mild, by catheterization 4/13  . Pulmonary hypertension (HCC)     Moderate, by catheterization 4/13  . CAD (coronary artery disease)     Nonobstructive, by catheterization 4/13  . Hypothyroidism   . HTN (hypertension)   . Morbid obesity (HCC)   . HLD (hyperlipidemia)      Allergies  Allergen Reactions  . Relafen  [Nabumetone] Other (See Comments)    Bladder pain     Current Outpatient Prescriptions  Medication Sig Dispense Refill  . acetaminophen-codeine (TYLENOL #3) 300-30 MG per tablet Take 1 tablet by mouth every 6 (six) hours as needed.     Marland Kitchen. albuterol (PROVENTIL HFA;VENTOLIN HFA) 108 (90 BASE) MCG/ACT inhaler Inhale 2 puffs into the lungs every 6 (six) hours as needed. For shortness of breath    . albuterol (PROVENTIL) (2.5 MG/3ML) 0.083% nebulizer solution Take 2.5 mg by nebulization every 6 (six) hours as needed.    Marland Kitchen. alendronate (FOSAMAX)  70 MG tablet Take 1 tablet by mouth Once a week.    . celecoxib (CELEBREX) 200 MG capsule Take 1 capsule by mouth 2 (two) times daily.  5  . diltiazem (CARDIZEM) 30 MG tablet Take 1 tablet (30 mg total) by mouth 2 (two) times daily. 180 tablet 3  . Fluticasone-Salmeterol (ADVAIR) 250-50 MCG/DOSE AEPB Inhale 1 puff into the lungs 2 (two) times daily.    . hydrochlorothiazide (HYDRODIURIL) 25 MG tablet Take 1 tablet (25 mg total) by mouth daily. 7 tablet 0  . HYDROcodone-acetaminophen (NORCO/VICODIN) 5-325 MG tablet Take 1 tablet by mouth every 4 (four) hours as needed.  0  . levothyroxine (SYNTHROID, LEVOTHROID) 50 MCG tablet Take 50 mcg by mouth daily.    Marland Kitchen. loratadine (CLARITIN) 10 MG tablet Take 10 mg by mouth as needed.     . metolazone (ZAROXOLYN) 2.5 MG tablet TAKE 1 TAB WEEKLY ON SATURDAYS 4 tablet 0  . montelukast (SINGULAIR) 10 MG tablet Take 10 mg by mouth at bedtime.    . potassium chloride SA (K-DUR,KLOR-CON) 20 MEQ tablet Take 20 mEq by mouth daily.    . rivaroxaban (XARELTO) 20 MG TABS tablet Take 1 tablet (20 mg total) by mouth daily with supper. 14 tablet 0  . rosuvastatin (CRESTOR) 10 MG tablet Take 10 mg by mouth daily.    Marland Kitchen. torsemide (  DEMADEX) 20 MG tablet Take 60 mg in the AM and 40 mg in PM 180 tablet 3   No current facility-administered medications for this visit.     Past Surgical History  Procedure Laterality Date  . Multiple tooth extractions      at 75 years of age  . Tubal ligation      1984  . Coronary artery bypass graft      3 or 4 years ago  . Tee with cardioversion      3 year ago  . Tee without cardioversion N/A 06/25/2015    Procedure: TRANSESOPHAGEAL ECHOCARDIOGRAM (TEE);  Surgeon: Antoine Poche, MD;  [facility];  Service: Cardiology;  Laterality: N/A;  moved to 8:30 - Terri calling pt & Bernie     Allergies  Allergen Reactions  . Relafen  [Nabumetone] Other (See Comments)    Bladder pain      Family History  Problem Relation  Age of Onset  . Heart disease Father 66  . Deep vein thrombosis Father   . Cancer Mother     right breast   . Cancer Sister     brain cancer  . Stomach cancer Brother      Social History Ms. Aitken reports that she quit smoking about 51 years ago. Her smoking use included Cigarettes. She started smoking about 55 years ago. She has a 5 pack-year smoking history. She has never used smokeless tobacco. Ms. Alton reports that she does not drink alcohol.   Review of Systems CONSTITUTIONAL: No weight loss, fever, chills, weakness or fatigue.  HEENT: Eyes: No visual loss, blurred vision, double vision or yellow sclerae.No hearing loss, sneezing, congestion, runny nose or sore throat.  SKIN: No rash or itching.  CARDIOVASCULAR:  RESPIRATORY: No shortness of breath, cough or sputum.  GASTROINTESTINAL: No anorexia, nausea, vomiting or diarrhea. No abdominal pain or blood.  GENITOURINARY: No burning on urination, no polyuria NEUROLOGICAL: No headache, dizziness, syncope, paralysis, ataxia, numbness or tingling in the extremities. No change in bowel or bladder control.  MUSCULOSKELETAL: No muscle, back pain, joint pain or stiffness.  LYMPHATICS: No enlarged nodes. No history of splenectomy.  PSYCHIATRIC: No history of depression or anxiety.  ENDOCRINOLOGIC: No reports of sweating, cold or heat intolerance. No polyuria or polydipsia.  Marland Kitchen   Physical Examination There were no vitals filed for this visit. There were no vitals filed for this visit.  Gen: resting comfortably, no acute distress HEENT: no scleral icterus, pupils equal round and reactive, no palptable cervical adenopathy,  CV Resp: Clear to auscultation bilaterally GI: abdomen is soft, non-tender, non-distended, normal bowel sounds, no hepatosplenomegaly MSK: extremities are warm, no edema.  Skin: warm, no rash Neuro:  no focal deficits Psych: appropriate affect   Diagnostic Studies 12/2011 TEE LVEF 55-60%, mod to  severe LAE with spontaneous echocontrast, mild MR, moderate MS MVA 1.5 with mean grad 5 mmHg,    12/2011 Echo LVEF 55-60%, mod MS with mean grad 8,   12/2011 [facility] Procedural Findings: Hemodynamics RA 14 RV 48/13 PA 50/18 mean 33 PCWP v wave 29, mean 18 LV 119/19 AO 124/62, mean 88  Oxygen saturations: PA 67 AO 98 SVC 71  Cardiac Output (Fick) 5.2  Cardiac Index (Fick) 2.5  Coronary angiography: Coronary dominance: right  Left mainstem: Mildly calcified, no obstructive disease  Left anterior descending (LAD): Reaches the LV apex, moderate caliber vessel without significant obstructive disease  Left circumflex (LCx): Moderate caliber vessel without significant disease  Right coronary artery (RCA): Large, dominant vessel. Gives off PDA branch. Mild irregularity but no significant obstructive disease  Left ventriculography: Deferred because of reduced creatinine clearance.  Final Conclusions:  1. Minor nonobstructive CAD 2. Moderate pulmonary HTN suspect primarily due to left heart disease (transpulmonic gradient , PVR 2.7) 3. Mild mitral stenosis  Recommendations: Careful hemodynamic study was done with simultaneous LV/PCWP tracing and I don't appreciate typical features of hemodynamically significant mitral stenosis. There was a large V wave in the wedge tracing which could be suggestive of a noncompliant LV or mitral regurgitation. I will review with Dr Andee Lineman.   04/2015 Echo Study Conclusions  - Left ventricle: The cavity size was normal. Wall thickness was normal. Systolic function was normal. The estimated ejection fraction was in the range of 60% to 65%. Wall motion was normal; there were no regional wall motion abnormalities. - Aortic valve: Mildly calcified annulus. Mildly thickened leaflets. Valve area (VTI): 1.76 cm^2. Valve area (Vmax): 2.64 cm^2. Valve area (Vmean): 2.33 cm^2. - Mitral valve: Mildly calcified annulus. Mildly  thickened leaflets . Difficult views of the MV leaflets. 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 The findings are consistent with moderate to severe stenosis. Mean gradient (D): 10 mm Hg. Valve area by pressure half-time: 1.42 cm^2. - Left atrium: The atrium was severely dilated. Consider TEE for further evaluatin of MV. - Right ventricle: The cavity size was mildly dilated. - Right atrium: The atrium was severely dilated. - Tricuspid valve: There was moderate-severe regurgitation. The TR vena contractia is 0.5 cm. The severity may be underestimated based on the eccentricity of the jet. Suggestion of hepatic systolic flow reversal suggests more severe TR. - Pulmonary arteries: Systolic pressure was severely increased. PA peak pressure: 89 mm Hg (S). - Inferior vena cava: The vessel was dilated. The respirophasic diameter changes were blunted (< 50%), consistent with elevated central venous pressure. - Technically difficult study.   05/2015 TEE Study Conclusions  - Left ventricle: The cavity size was normal. Wall thickness was normal. Systolic function was normal. The estimated ejection fraction was in the range of 55% to 60%. - Aortic valve: There was mild regurgitation. The AI vena contracta is 0.2 cm. - Mitral valve: The findings are consistent with moderate stenosis. There was mild regurgitation. Mean gradient (D): 8 mm Hg. Valve area by pressure half-time: 1.3 cm^2. - Left atrium: The atrium was severely dilated. No evidence of thrombus in the atrial cavity or appendage. - Right ventricle: The cavity size was moderately dilated. - Right atrium: The atrium was severely dilated. The interatrial septum bowes to the left at times consistent with elevated RA pressures. - Atrial septum: No defect or patent foramen ovale was identified. - Tricuspid valve: There was severe regurgitation. The TR  vena contracta is 0.7 cm. The TR is eccentric and poteriorally directed. - Pulmonary arteries: Systolic pressure was severely increased. PASP is at least 83 mmHg. IVC not visualized during this test, cannot estimate RA pressure. - Technically adequate study.     Assessment and Plan  1. Acute on chronic diastolic HF - very significant weight loss initially with metolazone. Unfortunately she did not contact us when her weights begain to trend back up and now she is up 15 pounds. Significant bump in Cr with metolazone that is trending down. - will try adding HCTZ daily x 7 days as a more milder thiazide diuretic to potentiate the effects of her continued torsemide. I have reemphasized the  great importance of daily weights and to keep [place] informed about weight changes. She is to call in Friday with update on her weights.  - check BMET and Mg in 1 week F/u 2 weeks. We will also place [facility] referral to help with her home medication and weight management.        Antoine Poche, M.D., F.A.C.C.

## 2015-10-16 ENCOUNTER — Ambulatory Visit: Payer: Medicare Other | Admitting: Cardiology

## 2015-10-16 ENCOUNTER — Telehealth: Payer: Self-pay | Admitting: *Deleted

## 2015-10-16 NOTE — Telephone Encounter (Signed)
Pt verbalized understanding of KCl increase X 4 days.

## 2015-10-16 NOTE — Telephone Encounter (Signed)
-----   Message from Antoine Poche, MD sent at 10/14/2015  9:43 AM EST ----- Labs show kidney function is improving. Her potassium is low, confirm she is takign daily and have her take 40 mEq for the next 4 days then resume 20 mEq daily   Dominga Ferry MD

## 2015-10-17 ENCOUNTER — Other Ambulatory Visit: Payer: Self-pay | Admitting: *Deleted

## 2015-10-17 ENCOUNTER — Telehealth: Payer: Self-pay | Admitting: *Deleted

## 2015-10-17 DIAGNOSIS — I5032 Chronic diastolic (congestive) heart failure: Secondary | ICD-10-CM

## 2015-10-17 NOTE — Patient Outreach (Signed)
[facility]) Care Management  10/17/2015  Ann Potter May 29, 1941 [id number]   Clarification received from care management assistant that patient is not eligible for [facility] care management services.  RN CM advised referral contact from cardiology office (Staci Ashworth ) that patient is not eligible for services due to primary care provider is not in [facility] network.   Recommendations made that patient could benefit from home health services. States she will advise doctor.   Plan:  Will send to care management assistant to close out case.  Colleen Can, RN BSN CCM Care Management Coordinator [facility] Care Management  [phone]

## 2015-10-17 NOTE — Telephone Encounter (Signed)
Linda from [facility] called to report that due to pcp not in [facility] network that they would not be able to manage home care, however recommending home health services. Will forward to Dr. Wyline Mood

## 2015-10-17 NOTE — Telephone Encounter (Signed)
Orders placed for Home [facility] to [facility]. Will forward pt information

## 2015-10-17 NOTE — Telephone Encounter (Signed)
Are we able to set up home health for her for her CHF, do we know who to contact?   Dominga Ferry MD

## 2015-10-28 ENCOUNTER — Telehealth: Payer: Self-pay | Admitting: *Deleted

## 2015-10-28 NOTE — Telephone Encounter (Signed)
Pt says weight is back up to 248lbs today and swollen in both legs, denies CP/SOB/dizzniess. Will forward to Dr. Wyline Mood. Pt aware provider will not be in office until tomorrow

## 2015-10-29 NOTE — Telephone Encounter (Signed)
Have her increase her AM toresmide to  and evening to  daily. Have her update [place] again in 1 week.   Dominga Ferry MD

## 2015-10-31 NOTE — Telephone Encounter (Signed)
Pt says weight has gone back 240 lbs, says the cause of weight gain could have been from recent sleeping troubles (says she has not slept well in 2 days) finally slept last night and the day before. Pt will increase dose if weight increases and call us back with an update in 1 week. Will forward to Dr. Melony Overly

## 2015-11-12 ENCOUNTER — Encounter: Payer: Self-pay | Admitting: *Deleted

## 2015-11-12 ENCOUNTER — Telehealth: Payer: Self-pay | Admitting: *Deleted

## 2015-11-12 NOTE — Telephone Encounter (Signed)
F/u pt weight, pt says weight today is 246lbs, seen Dr. Lysbeth Galas and had labs done las Monday, Dr Lysbeth Galas increased KCl and ordered CXR which will be done this Thursday. Pt had no complaints. Will request labs and forward to Dr. Melony Overly

## 2015-11-18 ENCOUNTER — Ambulatory Visit (INDEPENDENT_AMBULATORY_CARE_PROVIDER_SITE_OTHER): Payer: Medicare Other | Admitting: Cardiology

## 2015-11-18 ENCOUNTER — Encounter: Payer: Self-pay | Admitting: Cardiology

## 2015-11-18 VITALS — BP 134/72 | HR 85 | Ht 60.0 in | Wt 248.0 lb

## 2015-11-18 DIAGNOSIS — I272 Other secondary pulmonary hypertension: Secondary | ICD-10-CM

## 2015-11-18 DIAGNOSIS — I050 Rheumatic mitral stenosis: Secondary | ICD-10-CM | POA: Diagnosis not present

## 2015-11-18 DIAGNOSIS — I480 Paroxysmal atrial fibrillation: Secondary | ICD-10-CM | POA: Diagnosis not present

## 2015-11-18 DIAGNOSIS — I5033 Acute on chronic diastolic (congestive) heart failure: Secondary | ICD-10-CM | POA: Diagnosis not present

## 2015-11-18 DIAGNOSIS — I071 Rheumatic tricuspid insufficiency: Secondary | ICD-10-CM

## 2015-11-18 MED ORDER — TORSEMIDE 20 MG PO TABS
60.0000 mg | ORAL_TABLET | Freq: Two times a day (BID) | ORAL | Status: DC
Start: 2015-11-18 — End: 2016-04-08

## 2015-11-18 MED ORDER — METOLAZONE 2.5 MG PO TABS: 2.5000 mg | ORAL_TABLET | ORAL | Status: DC

## 2015-11-18 NOTE — Patient Instructions (Addendum)
   Continue Torsemide  twice a day.  Change the Metolazone to 2.5mg  - one tab monthly.   New prescriptions sent to [facility] today on above.  Continue all other medications.   Follow up in  1 month

## 2015-11-18 NOTE — Progress Notes (Signed)
Patient ID: Ann Potter, female   DOB: 1941-07-12, 75 y.o.   MRN: [id number]     Clinical Summary Ann Potter is a 75 y.o.female seen today for follow up of the following medical problems.   1. Acute on chronic diastolic heart failure  - weight and fluid status has been up and down. At her best she was down to 236 lbs - last visit we tried adding HCTZ 12.5mg  daily, and later increased to 25mg  daily combined with her torsemide. This did not seem to increase her diuresis.  - the most effective has been metolazoe, however dosing once weeks x 2 doses caused very rapid diuresis and AKI - home weights around 244 lbs. She is taking torsemide 60mg  bid currently.   2. Mitral stenosis - mild to moderate overall based on TTE, TEE, and cath in 2013 - reports worsening SOB/DOE and LE edema over the last few months. Significant weight gain - Echo 04/2015 showed severe mean grad across MV of 10 mmHg, calculated area by PHT 1.4. Severely elevated PASP at 89 mmHg, moderate to severe TR. Mildly dilated RV, normal RV function.  - repeated TEE, showed moderate MS with mean grad 8 and area by PHT of 1.3. Severe TR, PASP at least 83 mmHg.  - continues to have LE edema    3. PAF - we previously changed lopressor to diltiazem due to fatigue - tolerating dilt much better, fatigue has resolved - no recent palpitations.   4. Pulmonary HTN - moderate to severe by echo at 89 mmHg, this was in the setting of severe volume overload and diastolic HF - have not had her euvolemic yet to repeat study.   5. 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 If ever considered for MV intervention would likely need TV intervened on as well.  Past Medical History  Diagnosis Date  . Atrial fibrillation (HCC)   . CHF (congestive heart failure) (HCC)   . Lower extremity edema   . Mitral stenosis     Mild, by catheterization 4/13  . Pulmonary hypertension (HCC)     Moderate, by catheterization  4/13  . CAD (coronary artery disease)     Nonobstructive, by catheterization 4/13  . Hypothyroidism   . HTN (hypertension)   . Morbid obesity (HCC)   . HLD (hyperlipidemia)      Allergies  Allergen Reactions  . Relafen  [Nabumetone] Other (See Comments)    Bladder pain     Current Outpatient Prescriptions  Medication Sig Dispense Refill  . acetaminophen-codeine (TYLENOL #3) 300-30 MG per tablet Take 1 tablet by mouth every 6 (six) hours as needed.     Marland Kitchen albuterol (PROVENTIL HFA;VENTOLIN HFA) 108 (90 BASE) MCG/ACT inhaler Inhale 2 puffs into the lungs every 6 (six) hours as needed. For shortness of breath    . albuterol (PROVENTIL) (2.5 MG/3ML) 0.083% nebulizer solution Take 2.5 mg by nebulization every 6 (six) hours as needed.    Marland Kitchen alendronate (FOSAMAX) 70 MG tablet Take 1 tablet by mouth Once a week.    . celecoxib (CELEBREX) 200 MG capsule Take 1 capsule by mouth 2 (two) times daily.  5  . diltiazem (CARDIZEM) 30 MG tablet Take 1 tablet (30 mg total) by mouth 2 (two) times daily. 180 tablet 3  . Fluticasone-Salmeterol (ADVAIR) 250-50 MCG/DOSE AEPB Inhale 1 puff into the lungs 2 (two) times daily.    . hydrochlorothiazide (HYDRODIURIL) 25 MG tablet Take 1 tablet (25 mg total) by mouth daily. 7  tablet 0  . HYDROcodone-acetaminophen (NORCO/VICODIN) 5-325 MG tablet Take 1 tablet by mouth every 4 (four) hours as needed.  0  . levothyroxine (SYNTHROID, LEVOTHROID) 50 MCG tablet Take 50 mcg by mouth daily.    Marland Kitchen loratadine (CLARITIN) 10 MG tablet Take 10 mg by mouth as needed.     . metolazone (ZAROXOLYN) 2.5 MG tablet TAKE 1 TAB WEEKLY ON SATURDAYS 4 tablet 0  . montelukast (SINGULAIR) 10 MG tablet Take 10 mg by mouth at bedtime.    . potassium chloride SA (K-DUR,KLOR-CON) 20 MEQ tablet Take 20 mEq by mouth daily.    . rivaroxaban (XARELTO) 20 MG TABS tablet Take 1 tablet (20 mg total) by mouth daily with supper. 14 tablet 0  . rosuvastatin (CRESTOR) 10 MG tablet Take 10 mg by mouth  daily.    Marland Kitchen torsemide (DEMADEX) 20 MG tablet Take 60 mg in the AM and 40 mg in PM 180 tablet 3   No current facility-administered medications for this visit.     Past Surgical History  Procedure Laterality Date  . Multiple tooth extractions      at 75 years of age  . Tubal ligation      1984  . Coronary artery bypass graft      3 or 4 years ago  . Tee with cardioversion      3 year ago  . Tee without cardioversion N/A 06/25/2015    Procedure: TRANSESOPHAGEAL ECHOCARDIOGRAM (TEE);  Surgeon: Antoine Poche, MD;  Location: [facility];  Service: Cardiology;  Laterality: N/A;  moved to 8:30 - Terri calling pt & Bernie     Allergies  Allergen Reactions  . Relafen  [Nabumetone] Other (See Comments)    Bladder pain      Family History  Problem Relation Age of Onset  . Heart disease Father 52  . Deep vein thrombosis Father   . Cancer Mother     right breast   . Cancer Sister     brain cancer  . Stomach cancer Brother      Social History Ann Potter reports that she quit smoking about 51 years ago. Her smoking use included Cigarettes. She started smoking about 55 years ago. She has a 5 pack-year smoking history. She has never used smokeless tobacco. Ann Potter reports that she does not drink alcohol.   Review of Systems CONSTITUTIONAL: No weight loss, fever, chills, weakness or fatigue.  HEENT: Eyes: No visual loss, blurred vision, double vision or yellow sclerae.No hearing loss, sneezing, congestion, runny nose or sore throat.  SKIN: No rash or itching.  CARDIOVASCULAR: no chest pain, no palpitations.  RESPIRATORY: No shortness of breath, cough or sputum.  GASTROINTESTINAL: No anorexia, nausea, vomiting or diarrhea. No abdominal pain or blood.  GENITOURINARY: No burning on urination, no polyuria NEUROLOGICAL: No headache, dizziness, syncope, paralysis, ataxia, numbness or tingling in the extremities. No change in bowel or bladder control.  MUSCULOSKELETAL: No  muscle, back pain, joint pain or stiffness.  LYMPHATICS: No enlarged nodes. No history of splenectomy.  PSYCHIATRIC: No history of depression or anxiety.  ENDOCRINOLOGIC: No reports of sweating, cold or heat intolerance. No polyuria or polydipsia.  Marland Kitchen   Physical Examination Filed Vitals:   11/18/15 1326  BP: 134/72  Pulse: 85   Filed Vitals:   11/18/15 1326  Height: 5' (1.524 m)  Weight: 248 lb (112.492 kg)    Gen: resting comfortably, no acute distress HEENT: no scleral icterus, pupils equal round and reactive, no  palptable cervical adenopathy,  CV: RRR, 2/6 systolic murmur LLSB, no jvd Resp: Clear to auscultation bilaterally GI: abdomen is soft, non-tender, non-distended, normal bowel sounds, no hepatosplenomegaly MSK: extremities are warm, no edema.  Skin: warm, no rash Neuro:  no focal deficits Psych: appropriate affect   Diagnostic Studies 12/2011 TEE LVEF 55-60%, mod to severe LAE with spontaneous echocontrast, mild MR, moderate MS MVA 1.5 with mean grad 5 mmHg,    12/2011 Echo LVEF 55-60%, mod MS with mean grad 8,   12/2011 [facility] Procedural Findings: Hemodynamics RA 14 RV 48/13 PA 50/18 mean 33 PCWP v wave 29, mean 18 LV 119/19 AO 124/62, mean 88  Oxygen saturations: PA 67 AO 98 SVC 71  Cardiac Output (Fick) 5.2  Cardiac Index (Fick) 2.5  Coronary angiography: Coronary dominance: right  Left mainstem: Mildly calcified, no obstructive disease  Left anterior descending (LAD): Reaches the LV apex, moderate caliber vessel without significant obstructive disease  Left circumflex (LCx): Moderate caliber vessel without significant disease  Right coronary artery (RCA): Large, dominant vessel. Gives off PDA Ann Potter. Mild irregularity but no significant obstructive disease  Left ventriculography: Deferred because of reduced creatinine clearance.  Final Conclusions:  1. Minor nonobstructive CAD 2. Moderate pulmonary HTN suspect primarily due  to left heart disease (transpulmonic gradient , PVR 2.7) 3. Mild mitral stenosis  Recommendations: Careful hemodynamic study was done with simultaneous LV/PCWP tracing and I don't appreciate typical features of hemodynamically significant mitral stenosis. There was a large V wave in the wedge tracing which could be suggestive of a noncompliant LV or mitral regurgitation. I will review with Dr Andee Lineman.   04/2015 Echo Study Conclusions  - Left ventricle: The cavity size was normal. Wall thickness was normal. Systolic function was normal. The estimated ejection fraction was in the range of 60% to 65%. Wall motion was normal; there were no regional wall motion abnormalities. - Aortic valve: Mildly calcified annulus. Mildly thickened leaflets. Valve area (VTI): 1.76 cm^2. Valve area (Vmax): 2.64 cm^2. Valve area (Vmean): 2.33 cm^2. - Mitral valve: Mildly calcified annulus. Mildly thickened leaflets . Difficult views of the MV leaflets. 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 The findings are consistent with moderate to severe stenosis. Mean gradient (D): 10 mm Hg. Valve area by pressure half-time: 1.42 cm^2. - Left atrium: The atrium was severely dilated. Consider TEE for further evaluatin of MV. - Right ventricle: The cavity size was mildly dilated. - Right atrium: The atrium was severely dilated. - Tricuspid valve: There was moderate-severe regurgitation. The TR vena contractia is 0.5 cm. The severity may be underestimated based on the eccentricity of the jet. Suggestion of hepatic systolic flow reversal suggests more severe TR. - Pulmonary arteries: Systolic pressure was severely increased. PA peak pressure: 89 mm Hg (S). - Inferior vena cava: The vessel was dilated. The respirophasic diameter changes were blunted (< 50%), consistent with elevated central venous pressure. - Technically difficult  study.   05/2015 TEE Study Conclusions  - Left ventricle: The cavity size was normal. Wall thickness was normal. Systolic function was normal. The estimated ejection fraction was in the range of 55% to 60%. - Aortic valve: There was mild regurgitation. The AI vena contracta is 0.2 cm. - Mitral valve: The findings are consistent with moderate stenosis. There was mild regurgitation. Mean gradient (D): 8 mm Hg. Valve area by pressure half-time: 1.3 cm^2. - Left atrium: The atrium was severely dilated. No evidence of thrombus in the atrial cavity  or appendage. - Right ventricle: The cavity size was moderately dilated. - Right atrium: The atrium was severely dilated. The interatrial septum bowes to the left at times consistent with elevated RA pressures. - Atrial septum: No defect or patent foramen ovale was identified. - Tricuspid valve: There was severe regurgitation. The TR vena contracta is 0.7 cm. The TR is eccentric and poteriorally directed. - Pulmonary arteries: Systolic pressure was severely increased. PASP is at least 83 mmHg. IVC not visualized during this test, cannot estimate RA pressure. - Technically adequate study.       Assessment and Plan  1. Acute on chronic diastolic HF - we will try metolazone 2.5mg  once a month. Dosing once weekly for just 2 doses caused significant rapid diuresis and AKI - d/c HCTZ, continue toresmide  bid.    2. PAF - no current symptoms, continue current meds - continue xarelto for stroke prevention  2. Mitral stenosis - moderate to severe range by imaging - continue to follow symptoms with diuresis.  - we may consider repeat RHC/LHC once diuresed.    3. Pulmonary HTN -  severe by echo at 89 mmHg, this was in the setting of severe volume overload and diastolic HF - once euvolemic will need repeat assessment, either by echo or cath.   5. Tricuspid regurgitation - severe by TEE, will continue diuretics  for now   F/u 1 month     Antoine Poche, M.D

## 2015-11-19 ENCOUNTER — Other Ambulatory Visit: Payer: Self-pay | Admitting: Cardiology

## 2015-11-19 MED ORDER — METOLAZONE 2.5 MG PO TABS: 2.5000 mg | ORAL_TABLET | ORAL | Status: DC

## 2015-12-23 ENCOUNTER — Ambulatory Visit: Payer: Medicare Other | Admitting: Cardiology

## 2015-12-30 ENCOUNTER — Encounter: Payer: Self-pay | Admitting: Cardiology

## 2015-12-30 ENCOUNTER — Ambulatory Visit (INDEPENDENT_AMBULATORY_CARE_PROVIDER_SITE_OTHER): Payer: Medicare Other | Admitting: Cardiology

## 2015-12-30 VITALS — BP 133/80 | HR 58 | Ht 60.0 in | Wt 240.8 lb

## 2015-12-30 DIAGNOSIS — I480 Paroxysmal atrial fibrillation: Secondary | ICD-10-CM | POA: Diagnosis not present

## 2015-12-30 DIAGNOSIS — I050 Rheumatic mitral stenosis: Secondary | ICD-10-CM | POA: Diagnosis not present

## 2015-12-30 DIAGNOSIS — I272 Other secondary pulmonary hypertension: Secondary | ICD-10-CM

## 2015-12-30 DIAGNOSIS — I5033 Acute on chronic diastolic (congestive) heart failure: Secondary | ICD-10-CM

## 2015-12-30 MED ORDER — METOLAZONE 2.5 MG PO TABS: ORAL_TABLET | ORAL | Status: DC

## 2015-12-30 NOTE — Patient Instructions (Signed)
Your physician recommends that you schedule a follow-up appointment in: 4 WEEKS WITH DR. BRANCH  Your physician has recommended you make the following change in your medication:   TAKE METOLAZONE 2.5 MG EVERY OTHER WEEK  Thank you for choosing [facility]!!

## 2015-12-30 NOTE — Progress Notes (Signed)
Patient ID: Ann Potter, female   DOB: [birth year], 75 y.o.   MRN: [id number]     Clinical Summary Ann Potter is a 75 y.o.female seen today for follow up of the following medical problems.   1. Acute on chronic diastolic heart failure  - weight and fluid status has been up and down.Weight is down from 248 lbs to 240 lbs since last visit. She is on toresmide  in AM and  at night. She takes metolazone 2.5mg  once a month.  - the most effective diuretic has been metolazone, however dosing once a week x 2 doses caused very rapid diuresis and AKI   2. Mitral stenosis - mild to moderate overall based on TTE, TEE, and cath in 2013 - reports worsening SOB/DOE and LE edema over the last few months. Significant weight gain - Echo 04/2015 showed severe mean grad across MV of 10 mmHg, calculated area by PHT 1.4. Severely elevated PASP at 89 mmHg, moderate to severe TR. Mildly dilated RV, normal RV function.  - repeated TEE, showed moderate MS with mean grad 8 and area by PHT of 1.3. Severe TR, PASP at least 83 mmHg.    3. PAF - we previously changed lopressor to diltiazem due to fatigue - tolerating dilt much better, fatigue has resolved - denies any recent palpitations.   4. Pulmonary HTN - moderate to severe by echo at 89 mmHg, this was in the setting of severe volume overload and diastolic HF - we have been working to get her euvolemic prior to repeat PASP by echo  5. 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 If ever considered for MV intervention would likely need TV intervened on as well.  Past Medical History  Diagnosis Date  . Atrial fibrillation (HCC)   . CHF (congestive heart failure) (HCC)   . Lower extremity edema   . Mitral stenosis     Mild, by catheterization 4/13  . Pulmonary hypertension (HCC)     Moderate, by catheterization 4/13  . CAD (coronary artery disease)     Nonobstructive, by catheterization 4/13  . Hypothyroidism   .  HTN (hypertension)   . Morbid obesity (HCC)   . HLD (hyperlipidemia)      Allergies  Allergen Reactions  . Relafen  [Nabumetone] Other (See Comments)    Bladder pain     Current Outpatient Prescriptions  Medication Sig Dispense Refill  . acetaminophen-codeine (TYLENOL #3) 300-30 MG per tablet Take 1 tablet by mouth every 6 (six) hours as needed.     Marland Kitchen albuterol (PROVENTIL HFA;VENTOLIN HFA) 108 (90 BASE) MCG/ACT inhaler Inhale 2 puffs into the lungs every 6 (six) hours as needed. For shortness of breath    . albuterol (PROVENTIL) (2.5 MG/3ML) 0.083% nebulizer solution Take 2.5 mg by nebulization every 6 (six) hours as needed.    Marland Kitchen alendronate (FOSAMAX) 70 MG tablet Take 1 tablet by mouth Once a week.    . diltiazem (CARDIZEM) 30 MG tablet Take 1 tablet (30 mg total) by mouth 2 (two) times daily. 180 tablet 3  . Fluticasone-Salmeterol (ADVAIR) 250-50 MCG/DOSE AEPB Inhale 1 puff into the lungs 2 (two) times daily.    Marland Kitchen HYDROcodone-acetaminophen (NORCO/VICODIN) 5-325 MG tablet Take 1 tablet by mouth every 4 (four) hours as needed.  0  . levothyroxine (SYNTHROID, LEVOTHROID) 50 MCG tablet Take 50 mcg by mouth daily.    Marland Kitchen loratadine (CLARITIN) 10 MG tablet Take 10 mg by mouth as needed.     Marland Kitchen  metolazone (ZAROXOLYN) 2.5 MG tablet Take 1 tablet (2.5 mg total) by mouth every 30 (thirty) days. 1 tablet 6  . montelukast (SINGULAIR) 10 MG tablet Take 10 mg by mouth at bedtime.    . potassium chloride SA (K-DUR,KLOR-CON) 20 MEQ tablet Take 20 mEq by mouth daily.    . rivaroxaban (XARELTO) 20 MG TABS tablet Take 1 tablet (20 mg total) by mouth daily with supper. 14 tablet 0  . rosuvastatin (CRESTOR) 10 MG tablet Take 10 mg by mouth daily.    . sertraline (ZOLOFT) 25 MG tablet Take 1 tablet by mouth daily.  5  . torsemide (DEMADEX) 20 MG tablet Take 3 tablets (60 mg total) by mouth 2 (two) times daily. 180 tablet 6   No current facility-administered medications for this visit.     Past Surgical  History  Procedure Laterality Date  . Multiple tooth extractions      at 75 years of age  . Tubal ligation      1984  . Coronary artery bypass graft      3 or 4 years ago  . Tee with cardioversion      3 year ago  . Tee without cardioversion N/A 06/25/2015    Procedure: TRANSESOPHAGEAL ECHOCARDIOGRAM (TEE);  Surgeon: Antoine PocheJonathan F Aboubacar Matsuo, MD;  Location: [facility];  Service: Cardiology;  Laterality: N/A;  moved to 8:30 - Terri calling pt & Bernie     Allergies  Allergen Reactions  . Relafen  [Nabumetone] Other (See Comments)    Bladder pain      Family History  Problem Relation Age of Onset  . Heart disease Father 8030  . Deep vein thrombosis Father   . Cancer Mother     right breast   . Cancer Sister     brain cancer  . Stomach cancer Brother      Social History Ann Potter reports that she quit smoking about 51 years ago. Her smoking use included Cigarettes. She started smoking about 55 years ago. She has a 5 pack-year smoking history. She has never used smokeless tobacco. Ann Potter reports that she does not drink alcohol.   Review of Systems CONSTITUTIONAL: No weight loss, fever, chills, weakness or fatigue.  HEENT: Eyes: No visual loss, blurred vision, double vision or yellow sclerae.No hearing loss, sneezing, congestion, runny nose or sore throat.  SKIN: No rash or itching.  CARDIOVASCULAR: per HPI RESPIRATORY: No shortness of breath, cough or sputum.  GASTROINTESTINAL: No anorexia, nausea, vomiting or diarrhea. No abdominal pain or blood.  GENITOURINARY: No burning on urination, no polyuria NEUROLOGICAL: No headache, dizziness, syncope, paralysis, ataxia, numbness or tingling in the extremities. No change in bowel or bladder control.  MUSCULOSKELETAL: No muscle, back pain, joint pain or stiffness.  LYMPHATICS: No enlarged nodes. No history of splenectomy.  PSYCHIATRIC: No history of depression or anxiety.  ENDOCRINOLOGIC: No reports of sweating, cold or  heat intolerance. No polyuria or polydipsia.  Marland Kitchen.   Physical Examination Filed Vitals:   12/30/15 1536  BP: 133/80  Pulse: 58   Filed Vitals:   12/30/15 1536  Height: 5' (1.524 m)  Weight: 240 lb 12.8 oz (109.226 kg)    Gen: resting comfortably, no acute distress HEENT: no scleral icterus, pupils equal round and reactive, no palptable cervical adenopathy,  CV: RRR, no m/r/g, no jvd Resp: Clear to auscultation bilaterally GI: abdomen is soft, non-tender, non-distended, normal bowel sounds, no hepatosplenomegaly MSK: extremities are warm, 1+ bilateral LE edema Skin: warm, no  rash Neuro:  no focal deficits Psych: appropriate affect   Diagnostic Studies 12/2011 TEE LVEF 55-60%, mod to severe LAE with spontaneous echocontrast, mild MR, moderate MS MVA 1.5 with mean grad 5 mmHg,    12/2011 Echo LVEF 55-60%, mod MS with mean grad 8,   12/2011 [facility] Procedural Findings: Hemodynamics RA 14 RV 48/13 PA 50/18 mean 33 PCWP v wave 29, mean 18 LV 119/19 AO 124/62, mean 88  Oxygen saturations: PA 67 AO 98 SVC 71  Cardiac Output (Fick) 5.2  Cardiac Index (Fick) 2.5  Coronary angiography: Coronary dominance: right  Left mainstem: Mildly calcified, no obstructive disease  Left anterior descending (LAD): Reaches the LV apex, moderate caliber vessel without significant obstructive disease  Left circumflex (LCx): Moderate caliber vessel without significant disease  Right coronary artery (RCA): Large, dominant vessel. Gives off PDA Terez Montee. Mild irregularity but no significant obstructive disease  Left ventriculography: Deferred because of reduced creatinine clearance.  Final Conclusions:  1. Minor nonobstructive CAD 2. Moderate pulmonary HTN suspect primarily due to left heart disease (transpulmonic gradient , PVR 2.7) 3. Mild mitral stenosis  Recommendations: Careful hemodynamic study was done with simultaneous LV/PCWP tracing and I don't appreciate  typical features of hemodynamically significant mitral stenosis. There was a large V wave in the wedge tracing which could be suggestive of a noncompliant LV or mitral regurgitation. I will review with Dr Andee Lineman.   04/2015 Echo Study Conclusions  - Left ventricle: The cavity size was normal. Wall thickness was normal. Systolic function was normal. The estimated ejection fraction was in the range of 60% to 65%. Wall motion was normal; there were no regional wall motion abnormalities. - Aortic valve: Mildly calcified annulus. Mildly thickened leaflets. Valve area (VTI): 1.76 cm^2. Valve area (Vmax): 2.64 cm^2. Valve area (Vmean): 2.33 cm^2. - Mitral valve: Mildly calcified annulus. Mildly thickened leaflets . Difficult views of the MV leaflets. 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 The findings are consistent with moderate to severe stenosis. Mean gradient (D): 10 mm Hg. Valve area by pressure half-time: 1.42 cm^2. - Left atrium: The atrium was severely dilated. Consider TEE for further evaluatin of MV. - Right ventricle: The cavity size was mildly dilated. - Right atrium: The atrium was severely dilated. - Tricuspid valve: There was moderate-severe regurgitation. The TR vena contractia is 0.5 cm. The severity may be underestimated based on the eccentricity of the jet. Suggestion of hepatic systolic flow reversal suggests more severe TR. - Pulmonary arteries: Systolic pressure was severely increased. PA peak pressure: 89 mm Hg (S). - Inferior vena cava: The vessel was dilated. The respirophasic diameter changes were blunted (< 50%), consistent with elevated central venous pressure. - Technically difficult study.   05/2015 TEE Study Conclusions  - Left ventricle: The cavity size was normal. Wall thickness was normal. Systolic function was normal. The estimated ejection fraction was in the range of 55% to  60%. - Aortic valve: There was mild regurgitation. The AI vena contracta is 0.2 cm. - Mitral valve: The findings are consistent with moderate stenosis. There was mild regurgitation. Mean gradient (D): 8 mm Hg. Valve area by pressure half-time: 1.3 cm^2. - Left atrium: The atrium was severely dilated. No evidence of thrombus in the atrial cavity or appendage. - Right ventricle: The cavity size was moderately dilated. - Right atrium: The atrium was severely dilated. The interatrial septum bowes to the left at times consistent with elevated RA pressures. - Atrial septum: No defect  or patent foramen ovale was identified. - Tricuspid valve: There was severe regurgitation. The TR vena contracta is 0.7 cm. The TR is eccentric and poteriorally directed. - Pulmonary arteries: Systolic pressure was severely increased. PASP is at least 83 mmHg. IVC not visualized during this test, cannot estimate RA pressure. - Technically adequate study.    Assessment and Plan  1. Acute on chronic diastolic HF - weight down 8 lbs since last visit, still with some edema. We will increase her metolazone to 2.5mg  every other week. Once weekly dosing caused significant AKI in the past. Continue toresemide at current dose.  - request labs from pcp   2. PAF - no current symptoms, continue current meds  2. Mitral stenosis - moderate to severe range by imaging - continue to follow symptoms with diuresis.  - we may consider repeat RHC/LHC once diuresed   3. Pulmonary HTN - severe by echo at 89 mmHg, this was in the setting of severe volume overload and diastolic HF - once euvolemic will need repeat assessment, this will be by echo or cath.   5. Tricuspid regurgitation - severe by TEE, will continue diuretics for now   F/u 4 weeks.    Antoine Poche, M.D.

## 2016-01-01 ENCOUNTER — Encounter: Payer: Self-pay | Admitting: *Deleted

## 2016-01-25 ENCOUNTER — Other Ambulatory Visit: Payer: Self-pay | Admitting: Cardiology

## 2016-01-30 ENCOUNTER — Telehealth: Payer: Self-pay | Admitting: Cardiology

## 2016-01-30 NOTE — Telephone Encounter (Signed)
Mrs. Ann Potter called stating that she feels like her fluid pill is not working. Her legs and feet are swelling.

## 2016-01-30 NOTE — Telephone Encounter (Signed)
Pt says weight is fluctuating between 236lbs & 239lbs and says legs and feet are swelling and painful with some SOB. Pt says she couldn't get transportation to appt for tomorrow, rescheduled for 5/23. Says she is taking metolazone and torsemide as directed. Will forward to Dr. Wyline MoodBranch

## 2016-01-31 ENCOUNTER — Ambulatory Visit: Payer: Medicare Other | Admitting: Cardiology

## 2016-01-31 NOTE — Telephone Encounter (Signed)
Have her take an additional metolazone today, if swelling does not improve can take another Metolazone on Sunday. Have her update us early next week  Dominga FerryJ Avree Szczygiel MD

## 2016-01-31 NOTE — Telephone Encounter (Signed)
Pt voiced understanding of extra metolazone, will f/u next week

## 2016-02-13 ENCOUNTER — Telehealth: Payer: Self-pay | Admitting: *Deleted

## 2016-02-13 DIAGNOSIS — I10 Essential (primary) hypertension: Secondary | ICD-10-CM

## 2016-02-13 NOTE — Telephone Encounter (Signed)
F/u: pt says weight has been stable at 229lbs. Pt says swelling is gone and she feels much better. Appreciative of call. Will forward to Dr. Melony OverlyBranch FYI

## 2016-02-14 NOTE — Telephone Encounter (Signed)
She has an appointment with me next week, can she go for a BMET and Mg before?   Dominga FerryJ Branch MD

## 2016-02-18 ENCOUNTER — Encounter: Payer: Self-pay | Admitting: *Deleted

## 2016-02-18 ENCOUNTER — Ambulatory Visit: Payer: Medicare Other | Admitting: Cardiology

## 2016-02-18 NOTE — Telephone Encounter (Signed)
Pt cx today's appt - appt notes say she was sick - will mail pt lab orders with note to reschedule appt and have labs done.

## 2016-02-18 NOTE — Addendum Note (Signed)
Addended by: Burman NievesASHWORTH, Sherrita Riederer T on: 02/18/2016 11:47 AM   Modules accepted: Orders

## 2016-03-06 ENCOUNTER — Telehealth: Payer: Self-pay | Admitting: *Deleted

## 2016-03-06 MED ORDER — POTASSIUM CHLORIDE CRYS ER 20 MEQ PO TBCR: 20.0000 meq | EXTENDED_RELEASE_TABLET | Freq: Two times a day (BID) | ORAL | Status: DC

## 2016-03-06 NOTE — Telephone Encounter (Signed)
-----   Message from Antoine PocheJonathan F Branch, MD sent at 03/03/2016  4:04 PM EDT ----- Labs show that her potassium is low. Please verify how she is currently taking her diuretics and potassium pills. If taking KCl 3036m daily I would increase to 36mEq bid. How is her swelling and weight doing?  Ann FerryJ Branch MD

## 2016-03-06 NOTE — Telephone Encounter (Signed)
Pt aware and is taking torsemide 20mg  3 tab bid and KCl 20MeQ daily. Pt will increase KCl to bid - new rx sent to pharmacy. Pt denies worsening or increase of weight and swelling. Weight this morning was 232 lbs. Routed to pcp

## 2016-03-09 ENCOUNTER — Ambulatory Visit: Payer: Medicare Other | Admitting: Cardiology

## 2016-03-13 ENCOUNTER — Encounter: Payer: Self-pay | Admitting: Cardiology

## 2016-03-13 ENCOUNTER — Ambulatory Visit (INDEPENDENT_AMBULATORY_CARE_PROVIDER_SITE_OTHER): Payer: Medicare Other | Admitting: Cardiology

## 2016-03-13 VITALS — BP 112/72 | HR 89 | Ht 60.0 in | Wt 235.0 lb

## 2016-03-13 DIAGNOSIS — I272 Other secondary pulmonary hypertension: Secondary | ICD-10-CM | POA: Diagnosis not present

## 2016-03-13 DIAGNOSIS — I480 Paroxysmal atrial fibrillation: Secondary | ICD-10-CM

## 2016-03-13 DIAGNOSIS — I5032 Chronic diastolic (congestive) heart failure: Secondary | ICD-10-CM | POA: Diagnosis not present

## 2016-03-13 DIAGNOSIS — I050 Rheumatic mitral stenosis: Secondary | ICD-10-CM

## 2016-03-13 DIAGNOSIS — I071 Rheumatic tricuspid insufficiency: Secondary | ICD-10-CM

## 2016-03-13 MED ORDER — DOCUSATE SODIUM 100 MG PO CAPS: 100.0000 mg | ORAL_CAPSULE | Freq: Every day | ORAL | Status: DC | PRN

## 2016-03-13 NOTE — Progress Notes (Signed)
Clinical Summary Ann Potter is a 75 y.o.female seen today for follow up of the following medical problems.   1. Chronic diastolic heart failure  - weight and fluid status has been up and down.Weight is down from 248 lbs to 232 lbs over the last several months. - the most effective diuretic has been metolazone, however dosing once a week x 2 doses caused very rapid diuresis and AKI. She currenty has been taking every other week.  - denies any significant SOB or DOE  2. Mitral stenosis - mild to moderate overall based on TTE, TEE, and cath in 2013 - reports worsening SOB/DOE and LE edema over the last few months. Significant weight gain - Echo 04/2015 showed severe mean grad across MV of 10 mmHg, calculated area by PHT 1.4. Severely elevated PASP at 89 mmHg, moderate to severe TR. Mildly dilated RV, normal RV function.  - repeated TEE, showed moderate MS with mean grad 8 and area by PHT of 1.3. Severe TR, PASP at least 83 mmHg.   - no significant symptoms  3. PAF - we previously changed lopressor to diltiazem due to fatigue - tolerating dilt much better, fatigue has resolved - denies any recent palpitations since last visit   4. Pulmonary HTN - moderate to severe by echo at 89 mmHg, this was in the setting of severe volume overload and diastolic HF - we have been working to get her euvolemic prior to repeat PASP by echo  5. 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 If ever considered for MV intervention would likely need TV intervened on as well.  Past Medical History  Diagnosis Date  . Atrial fibrillation (HCC)   . CHF (congestive heart failure) (HCC)   . Lower extremity edema   . Mitral stenosis     Mild, by catheterization 4/13  . Pulmonary hypertension (HCC)     Moderate, by catheterization 4/13  . CAD (coronary artery disease)     Nonobstructive, by catheterization 4/13  . Hypothyroidism   . HTN (hypertension)   . Morbid obesity  (HCC)   . HLD (hyperlipidemia)      Allergies  Allergen Reactions  . Relafen  [Nabumetone] Other (See Comments)    Bladder pain     Current Outpatient Prescriptions  Medication Sig Dispense Refill  . acetaminophen-codeine (TYLENOL #3) 300-30 MG per tablet Take 1 tablet by mouth every 6 (six) hours as needed.     Marland Kitchen albuterol (PROVENTIL HFA;VENTOLIN HFA) 108 (90 BASE) MCG/ACT inhaler Inhale 2 puffs into the lungs every 6 (six) hours as needed. For shortness of breath    . albuterol (PROVENTIL) (2.5 MG/3ML) 0.083% nebulizer solution Take 2.5 mg by nebulization every 6 (six) hours as needed.    Marland Kitchen alendronate (FOSAMAX) 70 MG tablet Take 1 tablet by mouth Once a week.    . diltiazem (CARDIZEM) 30 MG tablet Take 1 tablet (30 mg total) by mouth 2 (two) times daily. 180 tablet 3  . Fluticasone-Salmeterol (ADVAIR) 250-50 MCG/DOSE AEPB Inhale 1 puff into the lungs 2 (two) times daily.    Marland Kitchen HYDROcodone-acetaminophen (NORCO/VICODIN) 5-325 MG tablet Take 1 tablet by mouth every 4 (four) hours as needed.  0  . levothyroxine (SYNTHROID, LEVOTHROID) 50 MCG tablet Take 50 mcg by mouth daily.    Marland Kitchen loratadine (CLARITIN) 10 MG tablet Take 10 mg by mouth as needed.     . metolazone (ZAROXOLYN) 2.5 MG tablet Take 1 tab every other week 30 tablet  3  . montelukast (SINGULAIR) 10 MG tablet Take 10 mg by mouth at bedtime.    . potassium chloride SA (K-DUR,KLOR-CON) 20 MEQ tablet Take 1 tablet (20 mEq total) by mouth 2 (two) times daily. 60 tablet 3  . rivaroxaban (XARELTO) 20 MG TABS tablet Take 1 tablet (20 mg total) by mouth daily with supper. 14 tablet 0  . rosuvastatin (CRESTOR) 10 MG tablet Take 10 mg by mouth daily.    . sertraline (ZOLOFT) 25 MG tablet Take 1 tablet by mouth daily.  5  . torsemide (DEMADEX) 20 MG tablet Take 3 tablets (60 mg total) by mouth 2 (two) times daily. 180 tablet 6  . torsemide (DEMADEX) 20 MG tablet TAKE 3 TABLETS (60 MG TOTAL) BY MOUTH 2 (TWO) TIMES DAILY. 180 tablet 3   No  current facility-administered medications for this visit.     Past Surgical History  Procedure Laterality Date  . Multiple tooth extractions      at 75 years of age  . Tubal ligation      1984  . Coronary artery bypass graft      3 or 4 years ago  . Tee with cardioversion      3 year ago  . Tee without cardioversion N/A 06/25/2015    Procedure: TRANSESOPHAGEAL ECHOCARDIOGRAM (TEE);  Surgeon: Antoine PocheJonathan F Kyoko Elsea, MD;  Location: [facility];  Service: Cardiology;  Laterality: N/A;  moved to 8:30 - Terri calling pt & Bernie     Allergies  Allergen Reactions  . Relafen  [Nabumetone] Other (See Comments)    Bladder pain      Family History  Problem Relation Age of Onset  . Heart disease Father 3330  . Deep vein thrombosis Father   . Cancer Mother     right breast   . Cancer Sister     brain cancer  . Stomach cancer Brother      Social History Ann Potter reports that she quit smoking about 51 years ago. Her smoking use included Cigarettes. She started smoking about 56 years ago. She has a 5 pack-year smoking history. She has never used smokeless tobacco. Ann Potter reports that she does not drink alcohol.   Review of Systems CONSTITUTIONAL: No weight loss, fever, chills, weakness or fatigue.  HEENT: Eyes: No visual loss, blurred vision, double vision or yellow sclerae.No hearing loss, sneezing, congestion, runny nose or sore throat.  SKIN: No rash or itching.  CARDIOVASCULAR: per HPI RESPIRATORY: No shortness of breath, cough or sputum.  GASTROINTESTINAL: No anorexia, nausea, vomiting or diarrhea. No abdominal pain or blood.  GENITOURINARY: No burning on urination, no polyuria NEUROLOGICAL: No headache, dizziness, syncope, paralysis, ataxia, numbness or tingling in the extremities. No change in bowel or bladder control.  MUSCULOSKELETAL: No muscle, back pain, joint pain or stiffness.  LYMPHATICS: No enlarged nodes. No history of splenectomy.  PSYCHIATRIC: No  history of depression or anxiety.  ENDOCRINOLOGIC: No reports of sweating, cold or heat intolerance. No polyuria or polydipsia.  Marland Kitchen.   Physical Examination Filed Vitals:   03/13/16 1504  BP: 112/72  Pulse: 89   Filed Vitals:   03/13/16 1504  Height: 5' (1.524 m)  Weight: 235 lb (106.595 kg)    Gen: resting comfortably, no acute distress HEENT: no scleral icterus, pupils equal round and reactive, no palptable cervical adenopathy,  CV: RRR, 3/6 systolic murmur LLSB, no jvd Resp: Clear to auscultation bilaterally GI: abdomen is soft, non-tender, non-distended, normal bowel sounds, no hepatosplenomegaly MSK:  extremities are warm, no edema.  Skin: warm, no rash Neuro:  no focal deficits Psych: appropriate affect   Diagnostic Studies 12/2011 TEE LVEF 55-60%, mod to severe LAE with spontaneous echocontrast, mild MR, moderate MS MVA 1.5 with mean grad 5 mmHg,    12/2011 Echo LVEF 55-60%, mod MS with mean grad 8,   12/2011 [facility] Procedural Findings: Hemodynamics RA 14 RV 48/13 PA 50/18 mean 33 PCWP v wave 29, mean 18 LV 119/19 AO 124/62, mean 88  Oxygen saturations: PA 67 AO 98 SVC 71  Cardiac Output (Fick) 5.2  Cardiac Index (Fick) 2.5  Coronary angiography: Coronary dominance: right  Left mainstem: Mildly calcified, no obstructive disease  Left anterior descending (LAD): Reaches the LV apex, moderate caliber vessel without significant obstructive disease  Left circumflex (LCx): Moderate caliber vessel without significant disease  Right coronary artery (RCA): Large, dominant vessel. Gives off PDA Kobie Matkins. Mild irregularity but no significant obstructive disease  Left ventriculography: Deferred because of reduced creatinine clearance.  Final Conclusions:  1. Minor nonobstructive CAD 2. Moderate pulmonary HTN suspect primarily due to left heart disease (transpulmonic gradient , PVR 2.7) 3. Mild mitral stenosis  Recommendations: Careful  hemodynamic study was done with simultaneous LV/PCWP tracing and I don't appreciate typical features of hemodynamically significant mitral stenosis. There was a large V wave in the wedge tracing which could be suggestive of a noncompliant LV or mitral regurgitation. I will review with Dr Andee Lineman.   04/2015 Echo Study Conclusions  - Left ventricle: The cavity size was normal. Wall thickness was normal. Systolic function was normal. The estimated ejection fraction was in the range of 60% to 65%. Wall motion was normal; there were no regional wall motion abnormalities. - Aortic valve: Mildly calcified annulus. Mildly thickened leaflets. Valve area (VTI): 1.76 cm^2. Valve area (Vmax): 2.64 cm^2. Valve area (Vmean): 2.33 cm^2. - Mitral valve: Mildly calcified annulus. Mildly thickened leaflets . Difficult views of the MV leaflets. 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 The findings are consistent with moderate to severe stenosis. Mean gradient (D): 10 mm Hg. Valve area by pressure half-time: 1.42 cm^2. - Left atrium: The atrium was severely dilated. Consider TEE for further evaluatin of MV. - Right ventricle: The cavity size was mildly dilated. - Right atrium: The atrium was severely dilated. - Tricuspid valve: There was moderate-severe regurgitation. The TR vena contractia is 0.5 cm. The severity may be underestimated based on the eccentricity of the jet. Suggestion of hepatic systolic flow reversal suggests more severe TR. - Pulmonary arteries: Systolic pressure was severely increased. PA peak pressure: 89 mm Hg (S). - Inferior vena cava: The vessel was dilated. The respirophasic diameter changes were blunted (< 50%), consistent with elevated central venous pressure. - Technically difficult study.   05/2015 TEE Study Conclusions  - Left ventricle: The cavity size was normal. Wall thickness was normal. Systolic  function was normal. The estimated ejection fraction was in the range of 55% to 60%. - Aortic valve: There was mild regurgitation. The AI vena contracta is 0.2 cm. - Mitral valve: The findings are consistent with moderate stenosis. There was mild regurgitation. Mean gradient (D): 8 mm Hg. Valve area by pressure half-time: 1.3 cm^2. - Left atrium: The atrium was severely dilated. No evidence of thrombus in the atrial cavity or appendage. - Right ventricle: The cavity size was moderately dilated. - Right atrium: The atrium was severely dilated. The interatrial septum bowes to the left at times consistent  with elevated RA pressures. - Atrial septum: No defect or patent foramen ovale was identified. - Tricuspid valve: There was severe regurgitation. The TR vena contracta is 0.7 cm. The TR is eccentric and poteriorally directed. - Pulmonary arteries: Systolic pressure was severely increased. PASP is at least 83 mmHg. IVC not visualized during this test, cannot estimate RA pressure. - Technically adequate study.    Assessment and Plan   1. Chronic diastolic HF - weight and edema continue to improve, signicant sustained weight loss over the last few months - diuresis has been limited by AKI in the past, her current regimen seems to be tolerated - we will repeat echo   2. PAF - no current symptoms, we will continue current meds - CHADS2Vasc score of 3, continue current meds  2. Mitral stenosis - moderate to severe range by imaging - we may consider repeat RHC/LHC once diuresed  - repeat echo  3. Pulmonary HTN - severe by echo at 89 mmHg, this was in the setting of severe volume overload and diastolic HF - follow up PASP by upcoming echo now that fluid status is improved.   5. 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

## 2016-03-13 NOTE — Patient Instructions (Signed)
Medication Instructions:   Begin Colace 100mg  daily as needed - new prescription sent to [facility] today. Continue all other medications.    Labwork:  NONE  Testing/Procedures: Your physician has requested that you have an echocardiogram. Echocardiography is a painless test that uses sound waves to create images of your heart. It provides your doctor with information about the size and shape of your heart and how well your heart's chambers and valves are working. This procedure takes approximately one hour. There are no restrictions for this procedure. Office will contact with results via phone or letter.    Follow-Up:  3 months   Any Other Special Instructions Will Be Listed Below (If Applicable).  If you need a refill on your cardiac medications before your next appointment, please call your pharmacy.

## 2016-03-19 ENCOUNTER — Ambulatory Visit (INDEPENDENT_AMBULATORY_CARE_PROVIDER_SITE_OTHER): Payer: Medicare Other

## 2016-03-19 ENCOUNTER — Other Ambulatory Visit: Payer: Self-pay

## 2016-03-19 DIAGNOSIS — I050 Rheumatic mitral stenosis: Secondary | ICD-10-CM | POA: Diagnosis not present

## 2016-03-19 LAB — ECHOCARDIOGRAM COMPLETE
CHL CUP LVOT MV VTI INDEX: 0.47 cm2/m2
CHL CUP MV M VEL: 132
CHL CUP REG VEL DIAS: 116 cm/s
E decel time: 324 msec
FS: 35 % (ref 28–44)
IV/PV OW: 0.95
LA vol A4C: 99.4 ml
LADIAMINDEX: 2.05 cm/m2
LASIZE: 45 mm
LAVOL: 73.5 mL
LAVOLIN: 33.5 mL/m2
LEFT ATRIUM END SYS DIAM: 45 mm
LVOT MV VTI: 1.02
LVOT SV: 75 mL
LVOT VTI: 26.5 cm
LVOT area: 2.84 cm2
LVOT diameter: 19 mm
LVOT peak grad rest: 6 mmHg
LVOTPV: 120 cm/s
MV Annulus VTI: 73.8 cm
MV Dec: 324
MV pk A vel: 57.6 m/s
MVG: 9 mmHg
MVPG: 22 mmHg
MVPKEVEL: 237 m/s
PV Reg grad dias: 5 mmHg
PW: 9.5 mm — AB (ref 0.6–1.1)
TAPSE: 19.7 mm

## 2016-03-26 ENCOUNTER — Telehealth: Payer: Self-pay | Admitting: *Deleted

## 2016-03-26 NOTE — Telephone Encounter (Signed)
-----   Message from Antoine PocheJonathan F Branch, MD sent at 03/23/2016 11:27 AM EDT ----- Echo shows fairly stable findings. Her mitral valve remains moderate to severely thickened/stiff, and the pressures on the right side of the heart remain elevated. We need to discuss potentially pursuing some additional testing. Can she see me back in 3-4 weeks  J BrancH MD

## 2016-03-26 NOTE — Telephone Encounter (Signed)
Pt aware, scheduled for 7/12 - says metolazone 2.5 weekly is not helping with swelling, would like to know if she needs to increase this more than once weekly. Will forward to Dr. Wyline MoodBranch

## 2016-03-26 NOTE — Telephone Encounter (Signed)
She can take an additional metolazone 2.5mg  if needed. Would try not to take more than 2 in a 1 week period  J Evangelina Delancey MD

## 2016-03-27 NOTE — Telephone Encounter (Signed)
Pt voiced understanding

## 2016-04-08 ENCOUNTER — Ambulatory Visit (INDEPENDENT_AMBULATORY_CARE_PROVIDER_SITE_OTHER): Payer: Medicare Other | Admitting: Cardiology

## 2016-04-08 ENCOUNTER — Encounter: Payer: Self-pay | Admitting: Cardiology

## 2016-04-08 ENCOUNTER — Encounter: Payer: Self-pay | Admitting: *Deleted

## 2016-04-08 VITALS — BP 131/73 | HR 85 | Ht 60.0 in | Wt 238.0 lb

## 2016-04-08 DIAGNOSIS — I050 Rheumatic mitral stenosis: Secondary | ICD-10-CM | POA: Diagnosis not present

## 2016-04-08 DIAGNOSIS — I272 Other secondary pulmonary hypertension: Secondary | ICD-10-CM | POA: Diagnosis not present

## 2016-04-08 DIAGNOSIS — I071 Rheumatic tricuspid insufficiency: Secondary | ICD-10-CM | POA: Diagnosis not present

## 2016-04-08 DIAGNOSIS — I480 Paroxysmal atrial fibrillation: Secondary | ICD-10-CM | POA: Diagnosis not present

## 2016-04-08 DIAGNOSIS — I5032 Chronic diastolic (congestive) heart failure: Secondary | ICD-10-CM

## 2016-04-08 MED ORDER — TORSEMIDE 20 MG PO TABS
80.0000 mg | ORAL_TABLET | Freq: Two times a day (BID) | ORAL | Status: AC
Start: 2016-04-08 — End: ?

## 2016-04-08 NOTE — Progress Notes (Addendum)
Clinical Summary Ann Potter is a 75 y.o.female seen today for follow up of the following medical problems.   1. Chronic diastolic heart failure  - weight and fluid status has been up and down over the last severla visits - the most effective diuretic has been metolazone, however dosing once a week x 2 doses caused very rapid diuresis and AKI. She currenty has been taking every other week.  - since last visit weight and edema fairly stable.   2. Mitral stenosis - mild to moderate overall based on TTE, TEE, and cath in 2013 - Echo 04/2015 showed severe mean grad across MV of 10 mmHg, calculated area by PHT 1.4. Severely elevated PASP at 89 mmHg, moderate to severe TR. Mildly dilated RV, normal RV function.  - repeated TEE 05/2015, showed moderate MS with mean grad 8 and area by PHT of 1.3. Severe TR, PASP at least 83 mmHg.  - 02/2016 echo mean grad 9, MVA 1.    3. PAF - we previously changed lopressor to diltiazem due to fatigue - tolerating dilt much better, fatigue has resolved - no recent palpitations since last visit   4. Pulmonary HTN - moderate to severe by echo at 75 mmHg  5. Tricuspid regurgitation - severe by TEE in 2016 - stable LE edema   Past Medical History  Diagnosis Date  . Atrial fibrillation (HCC)   . CHF (congestive heart failure) (HCC)   . Lower extremity edema   . Mitral stenosis     Mild, by catheterization 4/13  . Pulmonary hypertension (HCC)     Moderate, by catheterization 4/13  . CAD (coronary artery disease)     Nonobstructive, by catheterization 4/13  . Hypothyroidism   . HTN (hypertension)   . Morbid obesity (HCC)   . HLD (hyperlipidemia)      Allergies  Allergen Reactions  . Relafen  [Nabumetone] Other (See Comments)    Bladder pain     Current Outpatient Prescriptions  Medication Sig Dispense Refill  . acetaminophen-codeine (TYLENOL #3) 300-30 MG per tablet Take 1 tablet by mouth every 6 (six) hours as needed.     Marland Kitchen  albuterol (PROVENTIL HFA;VENTOLIN HFA) 108 (90 BASE) MCG/ACT inhaler Inhale 2 puffs into the lungs every 6 (six) hours as needed. For shortness of breath    . albuterol (PROVENTIL) (2.5 MG/3ML) 0.083% nebulizer solution Take 2.5 mg by nebulization every 6 (six) hours as needed.    Marland Kitchen alendronate (FOSAMAX) 70 MG tablet Take 1 tablet by mouth Once a week.    . diltiazem (CARDIZEM) 30 MG tablet Take 1 tablet (30 mg total) by mouth 2 (two) times daily. 180 tablet 3  . Fluticasone-Salmeterol (ADVAIR) 250-50 MCG/DOSE AEPB Inhale 1 puff into the lungs 2 (two) times daily.    Marland Kitchen HYDROcodone-acetaminophen (NORCO/VICODIN) 5-325 MG tablet Take 1 tablet by mouth every 4 (four) hours as needed.  0  . levothyroxine (SYNTHROID, LEVOTHROID) 50 MCG tablet Take 50 mcg by mouth daily.    Marland Kitchen loratadine (CLARITIN) 10 MG tablet Take 10 mg by mouth as needed.     . metolazone (ZAROXOLYN) 2.5 MG tablet Take 1 tab every other week (Patient taking differently: Take 2.5-5 mg by mouth once a week. ) 30 tablet 3  . montelukast (SINGULAIR) 10 MG tablet Take 10 mg by mouth at bedtime.    . potassium chloride SA (K-DUR,KLOR-CON) 20 MEQ tablet Take 1 tablet (20 mEq total) by mouth 2 (two) times daily. 60 tablet  3  . rivaroxaban (XARELTO) 20 MG TABS tablet Take 1 tablet (20 mg total) by mouth daily with supper. 14 tablet 0  . rosuvastatin (CRESTOR) 10 MG tablet Take 10 mg by mouth daily.    . sertraline (ZOLOFT) 25 MG tablet Take 1 tablet by mouth daily.  5  . torsemide (DEMADEX) 20 MG tablet Take 3 tablets (60 mg total) by mouth 2 (two) times daily. 180 tablet 6   No current facility-administered medications for this visit.     Past Surgical History  Procedure Laterality Date  . Multiple tooth extractions      at 75 years of age  . Tubal ligation      1984  . Coronary artery bypass graft      3 or 4 years ago  . Tee with cardioversion      3 year ago  . Tee without cardioversion N/A 06/25/2015    Procedure:  TRANSESOPHAGEAL ECHOCARDIOGRAM (TEE);  Surgeon: Antoine Poche, MD;  Location: [facility];  Service: Cardiology;  Laterality: N/A;  moved to 8:30 - Terri calling pt & Bernie     Allergies  Allergen Reactions  . Relafen  [Nabumetone] Other (See Comments)    Bladder pain      Family History  Problem Relation Age of Onset  . Heart disease Father 34  . Deep vein thrombosis Father   . Cancer Mother     right breast   . Cancer Sister     brain cancer  . Stomach cancer Brother      Social History Ann Potter reports that she quit smoking about 51 years ago. Her smoking use included Cigarettes. She started smoking about 56 years ago. She has a 5 pack-year smoking history. She has never used smokeless tobacco. Ann Potter reports that she does not drink alcohol.   Review of Systems CONSTITUTIONAL: No weight loss, fever, chills, weakness or fatigue.  HEENT: Eyes: No visual loss, blurred vision, double vision or yellow sclerae.No hearing loss, sneezing, congestion, runny nose or sore throat.  SKIN: No rash or itching.  CARDIOVASCULAR: per HPI RESPIRATORY: No shortness of breath, cough or sputum.  GASTROINTESTINAL: No anorexia, nausea, vomiting or diarrhea. No abdominal pain or blood.  GENITOURINARY: No burning on urination, no polyuria NEUROLOGICAL: No headache, dizziness, syncope, paralysis, ataxia, numbness or tingling in the extremities. No change in bowel or bladder control.  MUSCULOSKELETAL: No muscle, back pain, joint pain or stiffness.  LYMPHATICS: No enlarged nodes. No history of splenectomy.  PSYCHIATRIC: No history of depression or anxiety.  ENDOCRINOLOGIC: No reports of sweating, cold or heat intolerance. No polyuria or polydipsia.  Marland Kitchen   Physical Examination Filed Vitals:   04/08/16 1448  BP: 131/73  Pulse: 85   Filed Weights   04/08/16 1448  Weight: 238 lb (107.956 kg)    Gen: resting comfortably, no acute distress HEENT: no scleral icterus, pupils  equal round and reactive, no palptable cervical adenopathy,  CV: RRR, no m/rg, 3/6 systolic LLSB Resp: Clear to auscultation bilaterally GI: abdomen is soft, non-tender, non-distended, normal bowel sounds, no hepatosplenomegaly MSK: extremities are warm, no edema.  Skin: warm, no rash Neuro:  no focal deficits Psych: appropriate affect   Diagnostic Studies 12/2011 TEE LVEF 55-60%, mod to severe LAE with spontaneous echocontrast, mild MR, moderate MS MVA 1.5 with mean grad 5 mmHg,    12/2011 Echo LVEF 55-60%, mod MS with mean grad 8,   12/2011 [facility] Procedural Findings: Hemodynamics RA 14 RV 48/13  PA 50/18 mean 33 PCWP v wave 29, mean 18 LV 119/19 AO 124/62, mean 88  Oxygen saturations: PA 67 AO 98 SVC 71  Cardiac Output (Fick) 5.2  Cardiac Index (Fick) 2.5  Coronary angiography: Coronary dominance: right  Left mainstem: Mildly calcified, no obstructive disease  Left anterior descending (LAD): Reaches the LV apex, moderate caliber vessel without significant obstructive disease  Left circumflex (LCx): Moderate caliber vessel without significant disease  Right coronary artery (RCA): Large, dominant vessel. Gives off PDA Makenzye Troutman. Mild irregularity but no significant obstructive disease  Left ventriculography: Deferred because of reduced creatinine clearance.  Final Conclusions:  1. Minor nonobstructive CAD 2. Moderate pulmonary HTN suspect primarily due to left heart disease (transpulmonic gradient 14mmHg, 2.7) 3. Mild mitral stenosis  Recommendations: Careful hemodynamic study was done with simultaneous LV/PCWP tracing and I don't appreciate typical features of hemodynamically significant mitral stenosis. There was a large V wave in the wedge tracing which could be suggestive of a noncompliant LV or mitral regurgitation. I will review with Dr Andee Lineman.   04/2015 Echo Study Conclusions  - Left ventricle: The cavity size was normal. Wall thickness  was normal. Systolic function was normal. The estimated ejection fraction was in the range of 60% to 65%. Wall motion was normal; there were no regional wall motion abnormalities. - Aortic valve: Mildly calcified annulus. Mildly thickened leaflets. Valve area (VTI): 1.76 cm^2. Valve area (Vmax): 2.64 cm^2. Valve area (Vmean): 2.33 cm^2. - Mitral valve: Mildly calcified annulus. Mildly thickened leaflets . Difficult views of the MV leaflets. 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 The findings are consistent with moderate to severe stenosis. Mean gradient (D): 10 mm Hg. Valve area by pressure half-time: 1.42 cm^2. - Left atrium: The atrium was severely dilated. Consider TEE for further evaluatin of MV. - Right ventricle: The cavity size was mildly dilated. - Right atrium: The atrium was severely dilated. - Tricuspid valve: There was moderate-severe regurgitation. The TR vena contractia is 0.5 cm. The severity may be underestimated based on the eccentricity of the jet. Suggestion of hepatic systolic flow reversal suggests more severe TR. - Pulmonary arteries: Systolic pressure was severely increased. PA peak pressure: 89 mm Hg (S). - Inferior vena cava: The vessel was dilated. The respirophasic diameter changes were blunted (< 50%), consistent with elevated central venous pressure. - Technically difficult study.   05/2015 TEE Study Conclusions  - Left ventricle: The cavity size was normal. Wall thickness was normal. Systolic function was normal. The estimated ejection fraction was in the range of 55% to 60%. - Aortic valve: There was mild regurgitation. The AI vena contracta is 0.2 cm. - Mitral valve: The findings are consistent with moderate stenosis. There was mild regurgitation. Mean gradient (D): 8 mm Hg. Valve area by pressure half-time: 1.3 cm^2. - Left atrium: The atrium was severely dilated.  No evidence of thrombus in the atrial cavity or appendage. - Right ventricle: The cavity size was moderately dilated. - Right atrium: The atrium was severely dilated. The interatrial septum bowes to the left at times consistent with elevated RA pressures. - Atrial septum: No defect or patent foramen ovale was identified. - Tricuspid valve: There was severe regurgitation. The TR vena contracta is 0.7 cm. The TR is eccentric and poteriorally directed. - Pulmonary arteries: Systolic pressure was severely increased. PASP is at least 83 mmHg. IVC not visualized during this test, cannot estimate RA pressure. - Technically adequate study.  02/2015 echo Study  Conclusions  - Left ventricle: The cavity size was normal. Wall thickness was  normal. Systolic function was normal. The estimated ejection  fraction was in the range of 60% to 65%. Wall motion was normal;  there were no regional wall motion abnormalities. The study is  not technically sufficient to allow evaluation of LV diastolic  function. - Aortic valve: Mildly to moderately calcified annulus. Trileaflet;  mildly calcified leaflets. There was trivial regurgitation. - Mitral valve: Calcified annulus. Mildly thickened, mildly  calcified leaflets . 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 The findings are consistent  with moderate to severe stenosis. There was trivial  regurgitation. Mean gradient (D): 9 mm Hg. Valve area by  continuity equation (using LVOT flow): 1.02 cm^2. - Left atrium: The atrium was moderately dilated. - Right ventricle: The cavity size was moderately dilated. Systolic  function was mildly reduced. - Right atrium: Central venous pressure (est): 8 mm Hg. - Tricuspid valve: There was mild-moderate regurgitation. - Pulmonary arteries: Systolic pressure was severely increased. PA  peak pressure: 75 mm Hg (S). - Pericardium, extracardiac: There was  no pericardial effusion.  Impressions:  - Normal LV wall thickness with LVEF 60-65%. Indeterminate  diastolic function in setting of atrial fibrillation. Moderate  left atrial enlargement. 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 Sclerotic aortic valve with trivial  aortic regurgitation. Moderate RV enlargement with mildly reduced  contraction. Mild to moderate tricuspid regurgitation with  evidence of severe pulmonary hypertension and PASP 75 mmHg.    Assessment and Plan  1. Chronic diastolic HF - weight and edema continue to be labile - we will increase torsemide to 80mg  bid, continue metolazone 2.5mg  every other week.    2. PAF - no current symptoms, we will continue current meds - EKG in clinic shows rate controlled afib - CHADS2Vasc score of 3, continue xarelto  2. Mitral stenosis - moderate to severe range by imaging, most recent echo right at higher end of moderate with mean gradient of 9 and MVA 1. Symptoms are out of proportion for moderate MS, she has severe pulmonary HTN as well by echo - we will repeat LHC/RHC/MV study, pending results she may need consideration for intervention if findings supportive of severe MS. If intervention indicated, review echo images to decide between valvuloplasty vs MVR.   3. Pulmonary HTN - severe by echo, we will repeat RHC.   5. Tricuspid regurgitation - severe by TEE, will continue diuretics for now   F/u 3 months         Antoine PocheJonathan F. Zi Newbury, M.D.

## 2016-04-08 NOTE — Patient Instructions (Signed)
Medication Instructions:   Increase Torsemide to 80mg  twice a day  - [facility]  Continue all other medications.    Labwork: NONE  Testing/Procedures: NONE  Follow-Up: 3 months   Any Other Special Instructions Will Be Listed Below (If Applicable). Note provided for ceiling fan today.   If you need a refill on your cardiac medications before your next appointment, please call your pharmacy.

## 2016-04-13 ENCOUNTER — Encounter: Payer: Self-pay | Admitting: *Deleted

## 2016-04-13 ENCOUNTER — Telehealth: Payer: Self-pay | Admitting: Cardiology

## 2016-04-13 NOTE — Telephone Encounter (Signed)
scheduled LHC/RHC for this pt 04/16/16 with Dr. Excell Seltzerooper at 12pm.

## 2016-04-13 NOTE — Telephone Encounter (Signed)
No precert required 

## 2016-04-16 ENCOUNTER — Observation Stay (HOSPITAL_COMMUNITY)
Admission: RE | Admit: 2016-04-16 | Discharge: 2016-04-17 | Disposition: A | Discharge status: Home / Self Care | Payer: Medicare Other | Source: Ambulatory Visit | Attending: Cardiovascular Disease | Admitting: Cardiovascular Disease

## 2016-04-16 ENCOUNTER — Encounter (HOSPITAL_COMMUNITY)
Admission: RE | Disposition: A | Discharge status: Home / Self Care | Payer: Self-pay | Source: Ambulatory Visit | Attending: Cardiovascular Disease

## 2016-04-16 ENCOUNTER — Encounter (HOSPITAL_COMMUNITY): Payer: Self-pay | Admitting: General Practice

## 2016-04-16 DIAGNOSIS — E785 Hyperlipidemia, unspecified: Secondary | ICD-10-CM | POA: Diagnosis not present

## 2016-04-16 DIAGNOSIS — I081 Rheumatic disorders of both mitral and tricuspid valves: Secondary | ICD-10-CM | POA: Diagnosis present

## 2016-04-16 DIAGNOSIS — I272 Other secondary pulmonary hypertension: Secondary | ICD-10-CM | POA: Insufficient documentation

## 2016-04-16 DIAGNOSIS — I38 Endocarditis, valve unspecified: Secondary | ICD-10-CM | POA: Insufficient documentation

## 2016-04-16 DIAGNOSIS — Z7901 Long term (current) use of anticoagulants: Secondary | ICD-10-CM | POA: Insufficient documentation

## 2016-04-16 DIAGNOSIS — Z955 Presence of coronary angioplasty implant and graft: Secondary | ICD-10-CM | POA: Insufficient documentation

## 2016-04-16 DIAGNOSIS — I2510 Atherosclerotic heart disease of native coronary artery without angina pectoris: Secondary | ICD-10-CM | POA: Insufficient documentation

## 2016-04-16 DIAGNOSIS — I050 Rheumatic mitral stenosis: Secondary | ICD-10-CM | POA: Diagnosis present

## 2016-04-16 DIAGNOSIS — I342 Nonrheumatic mitral (valve) stenosis: Secondary | ICD-10-CM | POA: Diagnosis not present

## 2016-04-16 DIAGNOSIS — I482 Chronic atrial fibrillation, unspecified: Secondary | ICD-10-CM | POA: Insufficient documentation

## 2016-04-16 DIAGNOSIS — I5032 Chronic diastolic (congestive) heart failure: Secondary | ICD-10-CM | POA: Diagnosis not present

## 2016-04-16 DIAGNOSIS — E039 Hypothyroidism, unspecified: Secondary | ICD-10-CM | POA: Diagnosis not present

## 2016-04-16 DIAGNOSIS — Z87891 Personal history of nicotine dependence: Secondary | ICD-10-CM | POA: Insufficient documentation

## 2016-04-16 DIAGNOSIS — E876 Hypokalemia: Secondary | ICD-10-CM | POA: Diagnosis not present

## 2016-04-16 DIAGNOSIS — I110 Hypertensive heart disease with heart failure: Secondary | ICD-10-CM | POA: Insufficient documentation

## 2016-04-16 HISTORY — DX: Unspecified asthma, uncomplicated: J45.909

## 2016-04-16 HISTORY — DX: Unspecified osteoarthritis, unspecified site: M19.90

## 2016-04-16 HISTORY — DX: Acute myocardial infarction, unspecified: I21.9

## 2016-04-16 HISTORY — PX: CARDIAC CATHETERIZATION: SHX172

## 2016-04-16 HISTORY — DX: Other chronic pain: G89.29

## 2016-04-16 HISTORY — DX: Low back pain, unspecified: M54.50

## 2016-04-16 HISTORY — DX: Anxiety disorder, unspecified: F41.9

## 2016-04-16 HISTORY — DX: Low back pain: M54.5

## 2016-04-16 HISTORY — DX: Chronic obstructive pulmonary disease, unspecified: J44.9

## 2016-04-16 HISTORY — DX: Cardiac murmur, unspecified: R01.1

## 2016-04-16 HISTORY — DX: Worries: R45.82

## 2016-04-16 LAB — BASIC METABOLIC PANEL
ANION GAP: 10 (ref 5–15)
Anion gap: 15 (ref 5–15)
Anion gap: 13 (ref 5–15)
BUN: 51 mg/dL — AB (ref 6–20)
BUN: 48 mg/dL — ABNORMAL HIGH (ref 6–20)
BUN: 42 mg/dL — ABNORMAL HIGH (ref 6–20)
CHLORIDE: 88 mmol/L — AB (ref 101–111)
CHLORIDE: 91 mmol/L — AB (ref 101–111)
CO2: 34 mmol/L — ABNORMAL HIGH (ref 22–32)
CO2: 34 mmol/L — ABNORMAL HIGH (ref 22–32)
CO2: 34 mmol/L — AB (ref 22–32)
Calcium: 10.1 mg/dL (ref 8.9–10.3)
Calcium: 9.7 mg/dL (ref 8.9–10.3)
Calcium: 9.4 mg/dL (ref 8.9–10.3)
Chloride: 95 mmol/L — ABNORMAL LOW (ref 101–111)
Creatinine, Ser: 1.71 mg/dL — ABNORMAL HIGH (ref 0.44–1.00)
Creatinine, Ser: 1.64 mg/dL — ABNORMAL HIGH (ref 0.44–1.00)
Creatinine, Ser: 1.41 mg/dL — ABNORMAL HIGH (ref 0.44–1.00)
GFR calc Af Amer: 41 mL/min — ABNORMAL LOW (ref >60)
GFR calc non Af Amer: 29 mL/min — ABNORMAL LOW (ref >60)
GFR calc non Af Amer: 35 mL/min — ABNORMAL LOW (ref >60)
GFR, EST AFRICAN AMERICAN: 32 mL/min — AB (ref >60)
GFR, EST AFRICAN AMERICAN: 34 mL/min — AB (ref >60)
GFR, EST NON AFRICAN AMERICAN: 28 mL/min — AB (ref >60)
GLUCOSE: 114 mg/dL — AB (ref 65–99)
Glucose, Bld: 125 mg/dL — ABNORMAL HIGH (ref 65–99)
Glucose, Bld: 125 mg/dL — ABNORMAL HIGH (ref 65–99)
POTASSIUM: 2.7 mmol/L — AB (ref 3.5–5.1)
POTASSIUM: 2.2 mmol/L — AB (ref 3.5–5.1)
POTASSIUM: 2.7 mmol/L — AB (ref 3.5–5.1)
SODIUM: 137 mmol/L (ref 135–145)
SODIUM: 138 mmol/L (ref 135–145)
Sodium: 139 mmol/L (ref 135–145)

## 2016-04-16 LAB — POCT I-STAT 3, VENOUS BLOOD GAS (G3P V)
Acid-Base Excess: 12 mmol/L — ABNORMAL HIGH (ref 0.0–2.0)
BICARBONATE: 37.4 meq/L — AB (ref 20.0–24.0)
O2 Saturation: 67 %
PH VEN: 7.484 — AB (ref 7.250–7.300)
PO2 VEN: 33 mmHg (ref 31.0–45.0)
TCO2: 39 mmol/L (ref 0–100)
pCO2, Ven: 49.8 mmHg (ref 45.0–50.0)

## 2016-04-16 LAB — CBC
HEMATOCRIT: 37.4 % (ref 36.0–46.0)
Hemoglobin: 12.7 g/dL (ref 12.0–15.0)
MCH: 32 pg (ref 26.0–34.0)
MCHC: 34 g/dL (ref 30.0–36.0)
MCV: 94.2 fL (ref 78.0–100.0)
PLATELETS: 184 10*3/uL (ref 150–400)
RBC: 3.97 MIL/uL (ref 3.87–5.11)
RDW: 13.3 % (ref 11.5–15.5)
WBC: 6 10*3/uL (ref 4.0–10.5)

## 2016-04-16 LAB — PROTIME-INR
INR: 1.05 (ref 0.00–1.49)
Prothrombin Time: 13.9 seconds (ref 11.6–15.2)

## 2016-04-16 LAB — POCT I-STAT 3, ART BLOOD GAS (G3+)
Acid-Base Excess: 12 mmol/L — ABNORMAL HIGH (ref 0.0–2.0)
BICARBONATE: 36.4 meq/L — AB (ref 20.0–24.0)
O2 SAT: 93 %
PCO2 ART: 43.6 mmHg (ref 35.0–45.0)
PO2 ART: 60 mmHg — AB (ref 80.0–100.0)
TCO2: 38 mmol/L (ref 0–100)
pH, Arterial: 7.53 — ABNORMAL HIGH (ref 7.350–7.450)

## 2016-04-16 SURGERY — RIGHT/LEFT HEART CATH AND CORONARY ANGIOGRAPHY
Anesthesia: LOCAL

## 2016-04-16 MED ORDER — POTASSIUM CHLORIDE 10 MEQ/100ML IV SOLN
10.0000 meq | INTRAVENOUS | Status: AC
  Administered 2016-04-16: 10 meq via INTRAVENOUS
  Filled 2016-04-16 (×4): qty 100

## 2016-04-16 MED ORDER — HEPARIN (PORCINE) IN NACL 2-0.9 UNIT/ML-% IJ SOLN
INTRAMUSCULAR | Status: AC
  Filled 2016-04-16: qty 1000

## 2016-04-16 MED ORDER — POTASSIUM CHLORIDE CRYS ER 20 MEQ PO TBCR
40.0000 meq | EXTENDED_RELEASE_TABLET | ORAL | Status: AC
  Administered 2016-04-16: 40 meq via ORAL

## 2016-04-16 MED ORDER — POTASSIUM CHLORIDE 10 MEQ/100ML IV SOLN
10.0000 meq | INTRAVENOUS | Status: AC
  Administered 2016-04-16 (×3): 10 meq via INTRAVENOUS
  Filled 2016-04-16 (×4): qty 100

## 2016-04-16 MED ORDER — SODIUM CHLORIDE 0.9% FLUSH: 3.0000 mL | Freq: Two times a day (BID) | INTRAVENOUS | Status: DC

## 2016-04-16 MED ORDER — SODIUM CHLORIDE 0.9 % IV SOLN
250.0000 mL | INTRAVENOUS | Status: DC | PRN
Start: 2016-04-16 — End: 2016-04-17

## 2016-04-16 MED ORDER — POTASSIUM CHLORIDE CRYS ER 20 MEQ PO TBCR
20.0000 meq | EXTENDED_RELEASE_TABLET | Freq: Once | ORAL | Status: AC
  Administered 2016-04-16: 20 meq via ORAL
  Filled 2016-04-16: qty 1

## 2016-04-16 MED ORDER — MIDAZOLAM HCL 2 MG/2ML IJ SOLN
INTRAMUSCULAR | Status: AC
  Filled 2016-04-16: qty 2

## 2016-04-16 MED ORDER — ONDANSETRON HCL 4 MG/2ML IJ SOLN: 4.0000 mg | Freq: Four times a day (QID) | INTRAMUSCULAR | Status: DC | PRN

## 2016-04-16 MED ORDER — ALBUTEROL SULFATE (2.5 MG/3ML) 0.083% IN NEBU: 2.5000 mg | INHALATION_SOLUTION | Freq: Four times a day (QID) | RESPIRATORY_TRACT | Status: DC | PRN

## 2016-04-16 MED ORDER — MONTELUKAST SODIUM 10 MG PO TABS
10.0000 mg | ORAL_TABLET | Freq: Every day | ORAL | Status: DC
  Administered 2016-04-17: 10 mg via ORAL
  Filled 2016-04-16: qty 1

## 2016-04-16 MED ORDER — MOMETASONE FURO-FORMOTEROL FUM 200-5 MCG/ACT IN AERO
2.0000 | INHALATION_SPRAY | Freq: Two times a day (BID) | RESPIRATORY_TRACT | Status: DC
  Administered 2016-04-16 – 2016-04-17 (×2): 2 via RESPIRATORY_TRACT
  Filled 2016-04-16: qty 8.8

## 2016-04-16 MED ORDER — POTASSIUM CHLORIDE 10 MEQ/100ML IV SOLN
INTRAVENOUS | Status: DC | PRN
  Administered 2016-04-16: 10 meq via INTRAVENOUS

## 2016-04-16 MED ORDER — RIVAROXABAN 20 MG PO TABS: 20.0000 mg | ORAL_TABLET | Freq: Every day | ORAL | Status: DC

## 2016-04-16 MED ORDER — FENTANYL CITRATE (PF) 100 MCG/2ML IJ SOLN
INTRAMUSCULAR | Status: AC
  Filled 2016-04-16: qty 2

## 2016-04-16 MED ORDER — LEVOTHYROXINE SODIUM 50 MCG PO TABS
50.0000 ug | ORAL_TABLET | Freq: Every day | ORAL | Status: DC
  Administered 2016-04-17: 07:00:00 50 ug via ORAL
  Filled 2016-04-16: qty 1

## 2016-04-16 MED ORDER — SODIUM CHLORIDE 0.9 % IV SOLN: INTRAVENOUS | Status: DC

## 2016-04-16 MED ORDER — DILTIAZEM HCL 30 MG PO TABS
30.0000 mg | ORAL_TABLET | Freq: Two times a day (BID) | ORAL | Status: DC
  Administered 2016-04-16 – 2016-04-17 (×2): 30 mg via ORAL
  Filled 2016-04-16 (×2): qty 1

## 2016-04-16 MED ORDER — LIDOCAINE HCL (PF) 1 % IJ SOLN
INTRAMUSCULAR | Status: DC | PRN
  Administered 2016-04-16: 20 mL

## 2016-04-16 MED ORDER — HEPARIN SODIUM (PORCINE) 1000 UNIT/ML IJ SOLN
INTRAMUSCULAR | Status: AC
  Filled 2016-04-16: qty 1

## 2016-04-16 MED ORDER — SODIUM CHLORIDE 0.9% FLUSH: 3.0000 mL | INTRAVENOUS | Status: DC | PRN

## 2016-04-16 MED ORDER — IOPAMIDOL (ISOVUE-370) INJECTION 76%
INTRAVENOUS | Status: AC
  Filled 2016-04-16: qty 125

## 2016-04-16 MED ORDER — POTASSIUM CHLORIDE CRYS ER 20 MEQ PO TBCR
EXTENDED_RELEASE_TABLET | ORAL | Status: AC
  Administered 2016-04-16: 20 meq via ORAL
  Filled 2016-04-16: qty 1

## 2016-04-16 MED ORDER — LIDOCAINE HCL (PF) 1 % IJ SOLN
INTRAMUSCULAR | Status: AC
  Filled 2016-04-16: qty 30

## 2016-04-16 MED ORDER — POTASSIUM CHLORIDE 10 MEQ/100ML IV SOLN
INTRAVENOUS | Status: AC
  Administered 2016-04-16: 10 meq via INTRAVENOUS
  Filled 2016-04-16: qty 100

## 2016-04-16 MED ORDER — FENTANYL CITRATE (PF) 100 MCG/2ML IJ SOLN
INTRAMUSCULAR | Status: DC | PRN
  Administered 2016-04-16: 25 ug via INTRAVENOUS

## 2016-04-16 MED ORDER — ROSUVASTATIN CALCIUM 10 MG PO TABS
10.0000 mg | ORAL_TABLET | Freq: Every day | ORAL | Status: DC
  Administered 2016-04-16: 10 mg via ORAL
  Filled 2016-04-16: qty 1

## 2016-04-16 MED ORDER — POTASSIUM CHLORIDE CRYS ER 20 MEQ PO TBCR
EXTENDED_RELEASE_TABLET | ORAL | Status: AC
  Administered 2016-04-16: 40 meq via ORAL
  Filled 2016-04-16: qty 1

## 2016-04-16 MED ORDER — IOPAMIDOL (ISOVUE-370) INJECTION 76%
INTRAVENOUS | Status: DC | PRN
  Administered 2016-04-16: 135 mL via INTRA_ARTERIAL

## 2016-04-16 MED ORDER — POTASSIUM CHLORIDE CRYS ER 20 MEQ PO TBCR
40.0000 meq | EXTENDED_RELEASE_TABLET | Freq: Once | ORAL | Status: AC
  Administered 2016-04-16: 40 meq via ORAL
  Filled 2016-04-16 (×2): qty 2

## 2016-04-16 MED ORDER — SODIUM CHLORIDE 0.9 % WEIGHT BASED INFUSION
1.0000 mL/kg/h | INTRAVENOUS | Status: DC
  Administered 2016-04-16: 1 mL/kg/h via INTRAVENOUS

## 2016-04-16 MED ORDER — LORATADINE 10 MG PO TABS: 10.0000 mg | ORAL_TABLET | ORAL | Status: DC | PRN

## 2016-04-16 MED ORDER — VERAPAMIL HCL 2.5 MG/ML IV SOLN
INTRAVENOUS | Status: AC
  Filled 2016-04-16: qty 2

## 2016-04-16 MED ORDER — ACETAMINOPHEN 325 MG PO TABS: 650.0000 mg | ORAL_TABLET | Freq: Three times a day (TID) | ORAL | Status: DC | PRN

## 2016-04-16 MED ORDER — POTASSIUM CHLORIDE CRYS ER 20 MEQ PO TBCR
20.0000 meq | EXTENDED_RELEASE_TABLET | Freq: Two times a day (BID) | ORAL | Status: DC
  Administered 2016-04-16: 22:00:00 20 meq via ORAL
  Filled 2016-04-16: qty 1

## 2016-04-16 MED ORDER — SODIUM CHLORIDE 0.9 % IV SOLN: 250.0000 mL | INTRAVENOUS | Status: DC | PRN

## 2016-04-16 MED ORDER — MIDAZOLAM HCL 2 MG/2ML IJ SOLN
INTRAMUSCULAR | Status: DC | PRN
  Administered 2016-04-16: 2 mg via INTRAVENOUS

## 2016-04-16 MED ORDER — SODIUM CHLORIDE 0.9 % WEIGHT BASED INFUSION
3.0000 mL/kg/h | INTRAVENOUS | Status: DC
  Administered 2016-04-16: 3 mL/kg/h via INTRAVENOUS

## 2016-04-16 MED ORDER — HEPARIN (PORCINE) IN NACL 2-0.9 UNIT/ML-% IJ SOLN
INTRAMUSCULAR | Status: DC | PRN
Start: 2016-04-16 — End: 2016-04-16
  Administered 2016-04-16: 1000 mL

## 2016-04-16 MED ORDER — SODIUM CHLORIDE 0.9% FLUSH
3.0000 mL | Freq: Two times a day (BID) | INTRAVENOUS | Status: DC
  Administered 2016-04-17: 11:00:00 3 mL via INTRAVENOUS

## 2016-04-16 MED ORDER — POTASSIUM CHLORIDE CRYS ER 20 MEQ PO TBCR
EXTENDED_RELEASE_TABLET | ORAL | Status: AC
  Filled 2016-04-16: qty 2

## 2016-04-16 SURGICAL SUPPLY — 17 items
CATH INFINITI 4FR 145 PIGTAIL (CATHETERS) ×2 IMPLANT
CATH INFINITI 4FR 3 DRC (CATHETERS) ×2 IMPLANT
CATH INFINITI 4FR JL 4.0 (CATHETERS) ×2 IMPLANT
CATH INFINITI 5FR JL5 (CATHETERS) ×2 IMPLANT
CATH SWAN GANZ 7F STRAIGHT (CATHETERS) ×2 IMPLANT
GLIDESHEATH SLEND SS 6F .021 (SHEATH) ×2 IMPLANT
KIT HEART LEFT (KITS) ×2 IMPLANT
PACK CARDIAC CATHETERIZATION (CUSTOM PROCEDURE TRAY) ×2 IMPLANT
SHEATH FAST CATH BRACH 5F 5CM (SHEATH) ×2 IMPLANT
SHEATH PINNACLE 4F 10CM (SHEATH) ×2 IMPLANT
SHEATH PINNACLE 5F 10CM (SHEATH) ×2 IMPLANT
SHEATH PINNACLE 7F 10CM (SHEATH) ×2 IMPLANT
TRANSDUCER W/STOPCOCK (MISCELLANEOUS) ×4 IMPLANT
TUBING ART PRESS 72  MALE/MALE (TUBING) ×2 IMPLANT
TUBING CIL FLEX 10 FLL-RA (TUBING) ×2 IMPLANT
WIRE EMERALD 3MM-J .035X150CM (WIRE) ×2 IMPLANT
WIRE SAFE-T 1.5MM-J .035X260CM (WIRE) ×2 IMPLANT

## 2016-04-16 NOTE — H&P (View-Only) (Signed)
Clinical Summary Ann Potter is a 75 y.o.female seen today for follow up of the following medical problems.   1. Chronic diastolic heart failure  - weight and fluid status has been up and down over the last severla visits - the most effective diuretic has been metolazone, however dosing once a week x 2 doses caused very rapid diuresis and AKI. She currenty has been taking every other week.  - since last visit weight and edema fairly stable.   2. Mitral stenosis - mild to moderate overall based on TTE, TEE, and cath in 2013 - Echo 04/2015 showed severe mean grad across MV of 10 mmHg, calculated area by PHT 1.4. Severely elevated PASP at 89 mmHg, moderate to severe TR. Mildly dilated RV, normal RV function.  - repeated TEE 05/2015, showed moderate MS with mean grad 8 and area by PHT of 1.3. Severe TR, PASP at least 83 mmHg.  - 02/2016 echo mean grad 9, MVA 1.    3. PAF - we previously changed lopressor to diltiazem due to fatigue - tolerating dilt much better, fatigue has resolved - no recent palpitations since last visit   4. Pulmonary HTN - moderate to severe by echo at 75 mmHg  5. Tricuspid regurgitation - severe by TEE in 2016 - stable LE edema   Past Medical History  Diagnosis Date  . Atrial fibrillation (HCC)   . CHF (congestive heart failure) (HCC)   . Lower extremity edema   . Mitral stenosis     Mild, by catheterization 4/13  . Pulmonary hypertension (HCC)     Moderate, by catheterization 4/13  . CAD (coronary artery disease)     Nonobstructive, by catheterization 4/13  . Hypothyroidism   . HTN (hypertension)   . Morbid obesity (HCC)   . HLD (hyperlipidemia)      Allergies  Allergen Reactions  . Relafen  [Nabumetone] Other (See Comments)    Bladder pain     Current Outpatient Prescriptions  Medication Sig Dispense Refill  . acetaminophen-codeine (TYLENOL #3) 300-30 MG per tablet Take 1 tablet by mouth every 6 (six) hours as needed.     Marland Kitchen  albuterol (PROVENTIL HFA;VENTOLIN HFA) 108 (90 BASE) MCG/ACT inhaler Inhale 2 puffs into the lungs every 6 (six) hours as needed. For shortness of breath    . albuterol (PROVENTIL) (2.5 MG/3ML) 0.083% nebulizer solution Take 2.5 mg by nebulization every 6 (six) hours as needed.    Marland Kitchen alendronate (FOSAMAX) 70 MG tablet Take 1 tablet by mouth Once a week.    . diltiazem (CARDIZEM) 30 MG tablet Take 1 tablet (30 mg total) by mouth 2 (two) times daily. 180 tablet 3  . Fluticasone-Salmeterol (ADVAIR) 250-50 MCG/DOSE AEPB Inhale 1 puff into the lungs 2 (two) times daily.    Marland Kitchen HYDROcodone-acetaminophen (NORCO/VICODIN) 5-325 MG tablet Take 1 tablet by mouth every 4 (four) hours as needed.  0  . levothyroxine (SYNTHROID, LEVOTHROID) 50 MCG tablet Take 50 mcg by mouth daily.    Marland Kitchen loratadine (CLARITIN) 10 MG tablet Take 10 mg by mouth as needed.     . metolazone (ZAROXOLYN) 2.5 MG tablet Take 1 tab every other week (Patient taking differently: Take 2.5-5 mg by mouth once a week. ) 30 tablet 3  . montelukast (SINGULAIR) 10 MG tablet Take 10 mg by mouth at bedtime.    . potassium chloride SA (K-DUR,KLOR-CON) 20 MEQ tablet Take 1 tablet (20 mEq total) by mouth 2 (two) times daily. 60 tablet  3  . rivaroxaban (XARELTO) 20 MG TABS tablet Take 1 tablet (20 mg total) by mouth daily with supper. 14 tablet 0  . rosuvastatin (CRESTOR) 10 MG tablet Take 10 mg by mouth daily.    . sertraline (ZOLOFT) 25 MG tablet Take 1 tablet by mouth daily.  5  . torsemide (DEMADEX) 20 MG tablet Take 3 tablets (60 mg total) by mouth 2 (two) times daily. 180 tablet 6   No current facility-administered medications for this visit.     Past Surgical History  Procedure Laterality Date  . Multiple tooth extractions      at 75 years of age  . Tubal ligation      1984  . Coronary artery bypass graft      3 or 4 years ago  . Tee with cardioversion      3 year ago  . Tee without cardioversion N/A 06/25/2015    Procedure:  TRANSESOPHAGEAL ECHOCARDIOGRAM (TEE);  Surgeon: Antoine Poche, MD;  Location: [facility];  Service: Cardiology;  Laterality: N/A;  moved to 8:30 - Terri calling pt & Bernie     Allergies  Allergen Reactions  . Relafen  [Nabumetone] Other (See Comments)    Bladder pain      Family History  Problem Relation Age of Onset  . Heart disease Father 34  . Deep vein thrombosis Father   . Cancer Mother     right breast   . Cancer Sister     brain cancer  . Stomach cancer Brother      Social History Ann Potter reports that she quit smoking about 51 years ago. Her smoking use included Cigarettes. She started smoking about 56 years ago. She has a 5 pack-year smoking history. She has never used smokeless tobacco. Ann Potter reports that she does not drink alcohol.   Review of Systems CONSTITUTIONAL: No weight loss, fever, chills, weakness or fatigue.  HEENT: Eyes: No visual loss, blurred vision, double vision or yellow sclerae.No hearing loss, sneezing, congestion, runny nose or sore throat.  SKIN: No rash or itching.  CARDIOVASCULAR: per HPI RESPIRATORY: No shortness of breath, cough or sputum.  GASTROINTESTINAL: No anorexia, nausea, vomiting or diarrhea. No abdominal pain or blood.  GENITOURINARY: No burning on urination, no polyuria NEUROLOGICAL: No headache, dizziness, syncope, paralysis, ataxia, numbness or tingling in the extremities. No change in bowel or bladder control.  MUSCULOSKELETAL: No muscle, back pain, joint pain or stiffness.  LYMPHATICS: No enlarged nodes. No history of splenectomy.  PSYCHIATRIC: No history of depression or anxiety.  ENDOCRINOLOGIC: No reports of sweating, cold or heat intolerance. No polyuria or polydipsia.  Marland Kitchen   Physical Examination Filed Vitals:   04/08/16 1448  BP: 131/73  Pulse: 85   Filed Weights   04/08/16 1448  Weight: 238 lb (107.956 kg)    Gen: resting comfortably, no acute distress HEENT: no scleral icterus, pupils  equal round and reactive, no palptable cervical adenopathy,  CV: RRR, no m/rg, 3/6 systolic LLSB Resp: Clear to auscultation bilaterally GI: abdomen is soft, non-tender, non-distended, normal bowel sounds, no hepatosplenomegaly MSK: extremities are warm, no edema.  Skin: warm, no rash Neuro:  no focal deficits Psych: appropriate affect   Diagnostic Studies 12/2011 TEE LVEF 55-60%, mod to severe LAE with spontaneous echocontrast, mild MR, moderate MS MVA 1.5 with mean grad 5 mmHg,    12/2011 Echo LVEF 55-60%, mod MS with mean grad 8,   12/2011 [facility] Procedural Findings: Hemodynamics RA 14 RV 48/13  PA 50/18 mean 33 PCWP v wave 29, mean 18 LV 119/19 AO 124/62, mean 88  Oxygen saturations: PA 67 AO 98 SVC 71  Cardiac Output (Fick) 5.2  Cardiac Index (Fick) 2.5  Coronary angiography: Coronary dominance: right  Left mainstem: Mildly calcified, no obstructive disease  Left anterior descending (LAD): Reaches the LV apex, moderate caliber vessel without significant obstructive disease  Left circumflex (LCx): Moderate caliber vessel without significant disease  Right coronary artery (RCA): Large, dominant vessel. Gives off PDA Ann Potter. Mild irregularity but no significant obstructive disease  Left ventriculography: Deferred because of reduced creatinine clearance.  Final Conclusions:  1. Minor nonobstructive CAD 2. Moderate pulmonary HTN suspect primarily due to left heart disease (transpulmonic gradient 14mmHg, 2.7) 3. Mild mitral stenosis  Recommendations: Careful hemodynamic study was done with simultaneous LV/PCWP tracing and I don't appreciate typical features of hemodynamically significant mitral stenosis. There was a large V wave in the wedge tracing which could be suggestive of a noncompliant LV or mitral regurgitation. I will review with Dr Andee Lineman.   04/2015 Echo Study Conclusions  - Left ventricle: The cavity size was normal. Wall thickness  was normal. Systolic function was normal. The estimated ejection fraction was in the range of 60% to 65%. Wall motion was normal; there were no regional wall motion abnormalities. - Aortic valve: Mildly calcified annulus. Mildly thickened leaflets. Valve area (VTI): 1.76 cm^2. Valve area (Vmax): 2.64 cm^2. Valve area (Vmean): 2.33 cm^2. - Mitral valve: Mildly calcified annulus. Mildly thickened leaflets . Difficult views of the MV leaflets. 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 The findings are consistent with moderate to severe stenosis. Mean gradient (D): 10 mm Hg. Valve area by pressure half-time: 1.42 cm^2. - Left atrium: The atrium was severely dilated. Consider TEE for further evaluatin of MV. - Right ventricle: The cavity size was mildly dilated. - Right atrium: The atrium was severely dilated. - Tricuspid valve: There was moderate-severe regurgitation. The TR vena contractia is 0.5 cm. The severity may be underestimated based on the eccentricity of the jet. Suggestion of hepatic systolic flow reversal suggests more severe TR. - Pulmonary arteries: Systolic pressure was severely increased. PA peak pressure: 89 mm Hg (S). - Inferior vena cava: The vessel was dilated. The respirophasic diameter changes were blunted (< 50%), consistent with elevated central venous pressure. - Technically difficult study.   05/2015 TEE Study Conclusions  - Left ventricle: The cavity size was normal. Wall thickness was normal. Systolic function was normal. The estimated ejection fraction was in the range of 55% to 60%. - Aortic valve: There was mild regurgitation. The AI vena contracta is 0.2 cm. - Mitral valve: The findings are consistent with moderate stenosis. There was mild regurgitation. Mean gradient (D): 8 mm Hg. Valve area by pressure half-time: 1.3 cm^2. - Left atrium: The atrium was severely dilated.  No evidence of thrombus in the atrial cavity or appendage. - Right ventricle: The cavity size was moderately dilated. - Right atrium: The atrium was severely dilated. The interatrial septum bowes to the left at times consistent with elevated RA pressures. - Atrial septum: No defect or patent foramen ovale was identified. - Tricuspid valve: There was severe regurgitation. The TR vena contracta is 0.7 cm. The TR is eccentric and poteriorally directed. - Pulmonary arteries: Systolic pressure was severely increased. PASP is at least 83 mmHg. IVC not visualized during this test, cannot estimate RA pressure. - Technically adequate study.  02/2015 echo Study  Conclusions  - Left ventricle: The cavity size was normal. Wall thickness was  normal. Systolic function was normal. The estimated ejection  fraction was in the range of 60% to 65%. Wall motion was normal;  there were no regional wall motion abnormalities. The study is  not technically sufficient to allow evaluation of LV diastolic  function. - Aortic valve: Mildly to moderately calcified annulus. Trileaflet;  mildly calcified leaflets. There was trivial regurgitation. - Mitral valve: Calcified annulus. Mildly thickened, mildly  calcified leaflets . 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 The findings are consistent  with moderate to severe stenosis. There was trivial  regurgitation. Mean gradient (D): 9 mm Hg. Valve area by  continuity equation (using LVOT flow): 1.02 cm^2. - Left atrium: The atrium was moderately dilated. - Right ventricle: The cavity size was moderately dilated. Systolic  function was mildly reduced. - Right atrium: Central venous pressure (est): 8 mm Hg. - Tricuspid valve: There was mild-moderate regurgitation. - Pulmonary arteries: Systolic pressure was severely increased. PA  peak pressure: 75 mm Hg (S). - Pericardium, extracardiac: There was  no pericardial effusion.  Impressions:  - Normal LV wall thickness with LVEF 60-65%. Indeterminate  diastolic function in setting of atrial fibrillation. Moderate  left atrial enlargement. 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 Sclerotic aortic valve with trivial  aortic regurgitation. Moderate RV enlargement with mildly reduced  contraction. Mild to moderate tricuspid regurgitation with  evidence of severe pulmonary hypertension and PASP 75 mmHg.    Assessment and Plan  1. Chronic diastolic HF - weight and edema continue to be labile - we will increase torsemide to 80mg  bid, continue metolazone 2.5mg  every other week.    2. PAF - no current symptoms, we will continue current meds - EKG in clinic shows rate controlled afib - CHADS2Vasc score of 3, continue xarelto  2. Mitral stenosis - moderate to severe range by imaging, most recent echo right at higher end of moderate with mean gradient of 9 and MVA 1. Symptoms are out of proportion for moderate MS, she has severe pulmonary HTN as well by echo - we will repeat LHC/RHC/MV study, pending results she may need consideration for intervention if findings supportive of severe MS. If intervention indicated, review echo images to decide between valvuloplasty vs MVR.   3. Pulmonary HTN - severe by echo, we will repeat RHC.   5. Tricuspid regurgitation - severe by TEE, will continue diuretics for now   F/u 3 months         Antoine PocheJonathan F. Izabel Chim, M.D.

## 2016-04-16 NOTE — Progress Notes (Signed)
Critical K+ called 2.7 but technician noted the sample was hemolyzed, lab order placed/ lab was called to draw.

## 2016-04-16 NOTE — Interval H&P Note (Signed)
History and Physical Interval Note:  04/16/2016 12:14 PM  Ann Potter  has presented today for surgery, with the diagnosis of Pulmonary HTN and Mitral Stenosis  The various methods of treatment have been discussed with the patient and family. After consideration of risks, benefits and other options for treatment, the patient has consented to  Procedure(s): Right/Left Heart Cath and Coronary Angiography (N/A) as a surgical intervention .  The patient's history has been reviewed, patient examined, no change in status, stable for surgery.  I have reviewed the patient's chart and labs.  Questions were answered to the patient's satisfaction.     Tonny Bollmanooper, Lafonda Patron

## 2016-04-16 NOTE — Progress Notes (Signed)
Jennifer notified of 2.2 Potassium .

## 2016-04-16 NOTE — Interval H&P Note (Signed)
History and Physical Interval Note:  04/16/2016 7:01 PM  Ann PoeGloria Harjo  has presented today for surgery, with the diagnosis of Pulmonary HTN and Mitral Stenosis  The various methods of treatment have been discussed with the patient and family. After consideration of risks, benefits and other options for treatment, the patient has consented to  Procedure(s): Right/Left Heart Cath and Coronary Angiography (N/A) as a surgical intervention .  The patient's history has been reviewed, patient examined, no change in status, stable for surgery.  I have reviewed the patient's chart and labs.  Questions were answered to the patient's satisfaction.     Tonny Bollmanooper, Liddy Deam

## 2016-04-16 NOTE — Progress Notes (Signed)
Site area: rt groin Site Prior to Removal:  Level 0 Pressure Applied For:  20 minutes Manual:   yes Patient Status During Pull:  stable Post Pull Site:  Level  0 Post Pull Instructions Given:  yes Post Pull Pulses Present: yes Dressing Applied:  tegaderm Bedrest begins @ 2015 Comments:

## 2016-04-17 ENCOUNTER — Encounter (HOSPITAL_COMMUNITY): Payer: Self-pay | Admitting: Cardiovascular Disease

## 2016-04-17 DIAGNOSIS — I482 Chronic atrial fibrillation, unspecified: Secondary | ICD-10-CM | POA: Insufficient documentation

## 2016-04-17 DIAGNOSIS — I081 Rheumatic disorders of both mitral and tricuspid valves: Secondary | ICD-10-CM | POA: Diagnosis not present

## 2016-04-17 DIAGNOSIS — I342 Nonrheumatic mitral (valve) stenosis: Secondary | ICD-10-CM | POA: Diagnosis not present

## 2016-04-17 DIAGNOSIS — I272 Other secondary pulmonary hypertension: Secondary | ICD-10-CM | POA: Diagnosis not present

## 2016-04-17 DIAGNOSIS — I071 Rheumatic tricuspid insufficiency: Secondary | ICD-10-CM

## 2016-04-17 DIAGNOSIS — I38 Endocarditis, valve unspecified: Secondary | ICD-10-CM | POA: Insufficient documentation

## 2016-04-17 DIAGNOSIS — I5032 Chronic diastolic (congestive) heart failure: Secondary | ICD-10-CM | POA: Diagnosis not present

## 2016-04-17 LAB — BASIC METABOLIC PANEL
ANION GAP: 10 (ref 5–15)
BUN: 42 mg/dL — AB (ref 6–20)
CO2: 33 mmol/L — ABNORMAL HIGH (ref 22–32)
CREATININE: 1.42 mg/dL — AB (ref 0.44–1.00)
Calcium: 9.3 mg/dL (ref 8.9–10.3)
Chloride: 98 mmol/L — ABNORMAL LOW (ref 101–111)
GFR, EST AFRICAN AMERICAN: 40 mL/min — AB (ref >60)
GFR, EST NON AFRICAN AMERICAN: 35 mL/min — AB (ref >60)
Glucose, Bld: 124 mg/dL — ABNORMAL HIGH (ref 65–99)
Potassium: 3.3 mmol/L — ABNORMAL LOW (ref 3.5–5.1)
SODIUM: 141 mmol/L (ref 135–145)

## 2016-04-17 MED ORDER — POTASSIUM CHLORIDE CRYS ER 20 MEQ PO TBCR
40.0000 meq | EXTENDED_RELEASE_TABLET | Freq: Once | ORAL | Status: AC
  Administered 2016-04-17: 11:00:00 40 meq via ORAL
  Filled 2016-04-17: qty 2

## 2016-04-17 MED ORDER — METOLAZONE 2.5 MG PO TABS: ORAL_TABLET | ORAL | Status: DC

## 2016-04-17 MED ORDER — POTASSIUM CHLORIDE CRYS ER 20 MEQ PO TBCR: 40.0000 meq | EXTENDED_RELEASE_TABLET | Freq: Two times a day (BID) | ORAL | Status: AC

## 2016-04-17 MED FILL — Heparin Sodium (Porcine) 2 Unit/ML in Sodium Chloride 0.9%: INTRAMUSCULAR | Qty: 1000 | Status: AC

## 2016-04-17 MED FILL — Heparin Sodium (Porcine) Inj 1000 Unit/ML: INTRAMUSCULAR | Qty: 10 | Status: AC

## 2016-04-17 NOTE — Plan of Care (Signed)
Problem: Phase II Progression Outcomes Goal: Ambulates up to 600 ft. in hall x 1 Outcome: Completed/Met Date Met:  04/17/16 Patient ambulated in hall with staff and use of walker. Tolerated well at her baseline activity level.

## 2016-04-17 NOTE — Care Management Note (Signed)
Case Management Note  Patient Details  Name: Ann Potter MRN: [id number] Date of Birth: 08-30-1941  Subjective/Objective:     Patient lives at home alone, pta indep, no needs was already on xarelto.                Action/Plan:   Expected Discharge Date:                  Expected Discharge Plan:  Home/Self Care  In-House Referral:     Discharge planning Services  CM Consult  Post Acute Care Choice:    Choice offered to:     DME Arranged:    DME Agency:     HH Arranged:    HH Agency:     Status of Service:  Completed, signed off  If discussed at [employer] Length of Stay Meetings, dates discussed:    Additional Comments:  Leone Havenaylor, Damichael Hofman Clinton, RN 04/17/2016, 11:43 AM

## 2016-04-17 NOTE — Care Management Obs Status (Signed)
MEDICARE OBSERVATION STATUS NOTIFICATION   Patient Details  Name: Ann Potter MRN: [id number] Date of Birth: Oct 26, 1940   Medicare Observation Status Notification Given:  Yes    Leone Havenaylor, Minyon Billiter Clinton, RN 04/17/2016, 9:06 AM

## 2016-04-17 NOTE — Progress Notes (Signed)
Patient Name: Ann Potter Date of Encounter: 04/17/2016  Hospital Problem List     Active Problems:   Mitral stenosis    Subjective   Feeling well this morning.   Inpatient Medications    . diltiazem  30 mg Oral BID  . levothyroxine  50 mcg Oral QAC breakfast  . mometasone-formoterol  2 puff Inhalation BID  . montelukast  10 mg Oral Daily  . potassium chloride SA  20 mEq Oral BID  . rivaroxaban  20 mg Oral Q supper  . rosuvastatin  10 mg Oral q1800  . sodium chloride flush  3 mL Intravenous Q12H    Vital Signs    Filed Vitals:   04/16/16 2203 04/17/16 0252 04/17/16 0722 04/17/16 0735  BP: 132/53 148/62 108/32   Pulse:  84 83   Temp:  97 F (36.1 C) 97.7 F (36.5 C)   TempSrc:  Axillary Oral   Resp: 21 20 17    Height:      Weight:  241 lb 13.5 oz (109.7 kg)    SpO2: 96% 95% 94% 97%    Intake/Output Summary (Last 24 hours) at 04/17/16 0859 Last data filed at 04/17/16 0252  Gross per 24 hour  Intake 311.25 ml  Output    500 ml  Net -188.75 ml   Filed Weights   04/16/16 1022 04/17/16 0252  Weight: 240 lb (108.863 kg) 241 lb 13.5 oz (109.7 kg)    Physical Exam    General: Pleasant obese older female, NAD. Neuro: Alert and oriented X 3. Moves all extremities spontaneously. Psych: Normal affect. HEENT:  Normal  Neck: Supple without bruits or JVD. Lungs:  Resp regular and unlabored, CTA. Heart: RRR no s3, s4, 3/6 systolic murmur. Abdomen: Soft, obese, non-tender, non-distended, BS + x 4.  Extremities: No clubbing, cyanosis or edema. DP/PT/Radials 2+ and equal bilaterally. Right groin site stable.   Labs    CBC  Recent Labs  04/16/16 1030  WBC 6.0  HGB 12.7  HCT 37.4  MCV 94.2  PLT 184   Basic Metabolic Panel  Recent Labs  04/16/16 1711 04/17/16 0536  NA 139 141  K 2.7* 3.3*  CL 95* 98*  CO2 34* 33*  GLUCOSE 114* 124*  BUN 42* 42*  CREATININE 1.41* 1.42*  CALCIUM 9.4 9.3   Liver Function Tests No results for input(s): AST,  ALT, ALKPHOS, BILITOT, PROT, ALBUMIN in the last 72 hours. No results for input(s): LIPASE, AMYLASE in the last 72 hours. Cardiac Enzymes No results for input(s): CKTOTAL, CKMB, CKMBINDEX, TROPONINI in the last 72 hours. BNP Invalid input(s): POCBNP D-Dimer No results for input(s): DDIMER in the last 72 hours. Hemoglobin A1C No results for input(s): HGBA1C in the last 72 hours. Fasting Lipid Panel No results for input(s): CHOL, HDL, LDLCALC, TRIG, CHOLHDL, LDLDIRECT in the last 72 hours. Thyroid Function Tests No results for input(s): TSH, T4TOTAL, T3FREE, THYROIDAB in the last 72 hours.  Invalid input(s): FREET3  Telemetry    A-fib with PVCs  ECG    A-fib, with incomplete RBBB, with non-specific ST changes noted on previous tracing.   Radiology    No results found.  Assessment & Plan    Ann Potter is a 75 yo female patient of Dr. Wyline MoodBranch with PMH of Chronic diastolic HF/PAF/MS/Pulmonary HTN and TR that was referred to Dr. Excell Seltzerooper for hx of Mitral stenosis with progressive symptoms. Her Echo from 8/16 showed severe mean grad across MV of 10 mmHg, and severely elevated  PASP at , moderate to severe TR. Repeat Echo 6/17 showed mean grad 9 and MVA 1.   1. MS/Pulmonary HTN: She underwent L/RHC yesterday with Dr. Excell Seltzer showing mild nonobstructive CAD, with moderate pulmonary hypertension mean PA pressure of 45 mmHg, trans-pulmonic gradient of 32 mmHg, and PVR of a proximally 5 Woods Units, and moderate mitral stenosis with mean transmitral gradient of and valve area of 1.39cm.  -- Dr. Excell Seltzer to review cath with Dr. Wyline Mood and determine medical therapy vs Tricuspid valve surgery. She reports wanting to move forward with surgery later this year.   2. PAF: A-fib on telemetry, controlled.  -- continue diltiazem, and Xarelto.   3. HLD: Continue statin  4. Hypokalemia: Morning K+ 3.3, will replace with this morning.   Signed, Laverda Page NP-C Pager  [phone] Patient seen and examined and history reviewed. Agree with above findings and plan. Doing well post cath. No dyspnea. In Afib with controlled rate. Potassium level very low after recent increase in diuretics. Will DC today on 80 meq potassium daily and repeat BMET on follow up. To see Dr. Wyline Mood to discuss possibility of valve surgery. Resume Xarelto today.  Ann Potter, MDFACC 04/17/2016 10:19 AM

## 2016-04-17 NOTE — Discharge Summary (Signed)
Discharge Summary    Patient ID: Ann Potter,  MRN: [id number], DOB/AGE: Feb 28, 1941 75 y.o.  Admit date: 04/16/2016 Discharge date: 04/17/2016  Primary Care Provider: Josue HectorNYLAND,LEONARD ROBERT Primary Cardiologist: Dr. Wyline MoodBranch  Discharge Diagnoses    Active Problems:   Mitral stenosis   Tricuspid insufficiency   Chronic atrial fibrillation (HCC)   Allergies Allergies  Allergen Reactions  . Relafen  [Nabumetone] Other (See Comments)    Bladder pain    Diagnostic Studies/Procedures    Right/Left Heart Cath: 04/16/2016  Conclusion     Dist LAD lesion, 40% stenosed.  Prox LAD to Mid LAD lesion, 30% stenosed.  1. Mild nonobstructive coronary artery disease 2. Moderate pulmonary hypertension with a mean PA pressure of 45 mmHg, trans-pulmonic gradient of 32 mmHg, and PVR of a proximally 5 Woods Units 3. At least moderate mitral stenosis with a mean transmitral gradient of 15 mmHg and a calculated mitral valve area of 1.39 cm  Will review with Dr. Wyline MoodBranch. Considerations include continued medical therapy versus mitral and tricuspid valve surgery.    _____________   History of Present Illness     Ann Potter is a 75 yo female patient of Dr. Wyline MoodBranch with PMH of Chronic diastolic HF/PAF/MS/Pulmonary HTN and TR that was referred to Dr. Excell Seltzerooper for hx of Mitral stenosis with progressive symptoms. Her Echo from 8/16 showed severe mean grad across MV of 10 mmHg, and severely elevated PASP at 89mmHg, moderate to severe TR. Repeat Echo 6/17 showed mean grad 9 and MVA 1.   Hospital Course     Consultants: None  She underwent L/RHC on 04/17/2016 with Dr. Excell Seltzerooper showing mild nonobstructive CAD, with moderate pulmonary hypertension mean PA pressure of 45 mmHg, trans-pulmonic gradient of 32 mmHg, and PVR of a proximally 5 Woods Units, and moderate mitral stenosis with mean transmitral gradient of 15mmHg and valve area of 1.39cm.   She was admitted overnight without complications.  Noted to be in a-fib with rate controlled on telemetry. She was supplemented with IV potassium prior to cath, and will increase her home potassium dose to 80 meq daily, given the recent increase in her diuretic at home. She was seen and examined by Dr. [place]azil[place] on 04/17/2016 and determined stable for discharge home. Able to ambulate in the hallway without assistance with nursing staff. Will arrange for follow in the office, and will need repeat BMET at appt.  _____________  Discharge Vitals Blood pressure 108/32, pulse 83, temperature 97.7 F (36.5 C), temperature source Oral, resp. rate 17, height 5\' 1"  (1.549 m), weight 241 lb 13.5 oz (109.7 kg), SpO2 97 %.  Filed Weights   04/16/16 1022 04/17/16 0252  Weight: 240 lb (108.863 kg) 241 lb 13.5 oz (109.7 kg)    Labs & Radiologic Studies    CBC  Recent Labs  04/16/16 1030  WBC 6.0  HGB 12.7  HCT 37.4  MCV 94.2  PLT 184   Basic Metabolic Panel  Recent Labs  04/16/16 1711 04/17/16 0536  NA 139 141  K 2.7* 3.3*  CL 95* 98*  CO2 34* 33*  GLUCOSE 114* 124*  BUN 42* 42*  CREATININE 1.41* 1.42*  CALCIUM 9.4 9.3   Liver Function Tests No results for input(s): AST, ALT, ALKPHOS, BILITOT, PROT, ALBUMIN in the last 72 hours. No results for input(s): LIPASE, AMYLASE in the last 72 hours. Cardiac Enzymes No results for input(s): CKTOTAL, CKMB, CKMBINDEX, TROPONINI in the last 72 hours. BNP Invalid input(s): POCBNP D-Dimer No results  for input(s): DDIMER in the last 72 hours. Hemoglobin A1C No results for input(s): HGBA1C in the last 72 hours. Fasting Lipid Panel No results for input(s): CHOL, HDL, LDLCALC, TRIG, CHOLHDL, LDLDIRECT in the last 72 hours. Thyroid Function Tests No results for input(s): TSH, T4TOTAL, T3FREE, THYROIDAB in the last 72 hours.  Invalid input(s): FREET3 _____________  No results found. Disposition   Pt is being discharged home today in good condition.  Follow-up Plans & Appointments    Follow-up  Information    Follow up with Dina Rich, MD On 05/01/2016.   Specialty:  Cardiology   Why:  1pm for your hospital follow up with Dr. Verna Czech PA, [facility].   Contact information:   [address] [place] [address] [phone]      Discharge Instructions    Call MD for:  redness, tenderness, or signs of infection (pain, swelling, redness, odor or green/yellow discharge around incision site)    Complete by:  As directed      Diet - low sodium heart healthy    Complete by:  As directed      Discharge instructions    Complete by:  As directed   Groin Site Care Refer to this sheet in the next few weeks. These instructions provide you with information on caring for yourself after your procedure. Your caregiver may also give you more specific instructions. Your treatment has been planned according to current medical practices, but problems sometimes occur. Call your caregiver if you have any problems or questions after your procedure. HOME CARE INSTRUCTIONS You may shower 24 hours after the procedure. Remove the bandage (dressing) and gently wash the site with plain soap and water. Gently pat the site dry.  Do not apply powder or lotion to the site.  Do not sit in a bathtub, swimming pool, or whirlpool for 5 to 7 days.  No bending, squatting, or lifting anything over 10 pounds (4.5 kg) as directed by your caregiver.  Inspect the site at least twice daily.  Do not drive home if you are discharged the same day of the procedure. Have someone else drive you.  You may drive 24 hours after the procedure unless otherwise instructed by your caregiver.  What to expect: Any bruising will usually fade within 1 to 2 weeks.  Blood that collects in the tissue (hematoma) may be painful to the touch. It should usually decrease in size and tenderness within 1 to 2 weeks.  SEEK IMMEDIATE MEDICAL CARE IF: You have unusual pain at the groin site or down the affected leg.  You have redness, warmth,  swelling, or pain at the groin site.  You have drainage (other than a small amount of blood on the dressing).  You have chills.  You have a fever or persistent symptoms for more than 72 hours.  You have a fever and your symptoms suddenly get worse.  Your leg becomes pale, cool, tingly, or numb.  You have heavy bleeding from the site. Hold pressure on the site. .     Increase activity slowly    Complete by:  As directed            Discharge Medications   Current Discharge Medication List    CONTINUE these medications which have CHANGED   Details  metolazone (ZAROXOLYN) 2.5 MG tablet Take 1 tab every other week Qty: 30 tablet, Refills: 3    potassium chloride SA (K-DUR,KLOR-CON) 20 MEQ tablet Take 2 tablets (40 mEq total)  by mouth 2 (two) times daily. Qty: 60 tablet, Refills: 10      CONTINUE these medications which have NOT CHANGED   Details  acetaminophen (TYLENOL) 650 MG CR tablet Take 650 mg by mouth every 8 (eight) hours as needed for pain.    albuterol (PROVENTIL HFA;VENTOLIN HFA) 108 (90 BASE) MCG/ACT inhaler Inhale 2 puffs into the lungs every 6 (six) hours as needed. For shortness of breath    diltiazem (CARDIZEM) 30 MG tablet Take 1 tablet (30 mg total) by mouth 2 (two) times daily.     Fluticasone-Salmeterol (ADVAIR) 250-50 MCG/DOSE AEPB Inhale 1 puff into the lungs 2 (two) times daily.    levothyroxine (SYNTHROID, LEVOTHROID) 50 MCG tablet Take 50 mcg by mouth daily.    loratadine (CLARITIN) 10 MG tablet Take 10 mg by mouth as needed for allergies.     montelukast (SINGULAIR) 10 MG tablet Take 10 mg by mouth daily.     rivaroxaban (XARELTO) 20 MG TABS tablet Take 1 tablet (20 mg total) by mouth daily with supper.     rosuvastatin (CRESTOR) 10 MG tablet Take 10 mg by mouth daily.    torsemide (DEMADEX) 20 MG tablet Take 4 tablets (80 mg total) by mouth 2 (two) times daily.       STOP taking these medications     albuterol (PROVENTIL) (2.5 MG/3ML) 0.083%  nebulizer solution            Outstanding Labs/Studies   BMET at follow up.  Duration of Discharge Encounter   Greater than 30 minutes including physician time.  Signed, Laverda Page NP-C 04/17/2016, 10:45 AM

## 2016-04-23 ENCOUNTER — Telehealth: Payer: Self-pay | Admitting: *Deleted

## 2016-04-23 NOTE — Telephone Encounter (Signed)
Patient called to report that she did lab work yesterday at her PCP's office and should be available in care everywhere. Patient also c/o that her fluid level is up and her weight is back up to 245 lbs today. Patient said when she was d/c from the hospital last Friday, her weight was 230 lbs. No c/o dizziness, chest pain, or sob. Patient confirmed with nurse that she is taking torsemide 80 mg twice daily. Patient said she is not using metolazone because she was told to stop this medication at the hospital. Nurse advised patient that it was still listed for her to use every other week and this message would be sent to her doctor for further advice. Patient is scheduled to see PA at the [place] office on 05/01/16.

## 2016-04-23 NOTE — Telephone Encounter (Signed)
Labs reviewed, potassium looks good. Kidney function still mildly decreased. She should take extra 80mg  of torsemide today and tomorrow. We are trying to limit metolazone if possible   Dominga Ferry MD

## 2016-04-24 NOTE — Telephone Encounter (Signed)
Patient informed and verbalized understanding of plan. 

## 2016-05-01 ENCOUNTER — Ambulatory Visit (INDEPENDENT_AMBULATORY_CARE_PROVIDER_SITE_OTHER): Payer: Medicare Other | Admitting: Cardiology

## 2016-05-01 ENCOUNTER — Encounter: Payer: Self-pay | Admitting: Cardiology

## 2016-05-01 DIAGNOSIS — I482 Chronic atrial fibrillation, unspecified: Secondary | ICD-10-CM

## 2016-05-01 DIAGNOSIS — I38 Endocarditis, valve unspecified: Secondary | ICD-10-CM | POA: Diagnosis not present

## 2016-05-01 DIAGNOSIS — I10 Essential (primary) hypertension: Secondary | ICD-10-CM | POA: Diagnosis not present

## 2016-05-01 DIAGNOSIS — N183 Chronic kidney disease, stage 3 unspecified: Secondary | ICD-10-CM

## 2016-05-01 DIAGNOSIS — I272 Other secondary pulmonary hypertension: Secondary | ICD-10-CM

## 2016-05-01 DIAGNOSIS — I5033 Acute on chronic diastolic (congestive) heart failure: Secondary | ICD-10-CM

## 2016-05-01 DIAGNOSIS — I2583 Coronary atherosclerosis due to lipid rich plaque: Secondary | ICD-10-CM

## 2016-05-01 DIAGNOSIS — I2510 Atherosclerotic heart disease of native coronary artery without angina pectoris: Secondary | ICD-10-CM

## 2016-05-01 DIAGNOSIS — N189 Chronic kidney disease, unspecified: Secondary | ICD-10-CM

## 2016-05-01 NOTE — Assessment & Plan Note (Signed)
Pt has moderate MS and TR by recent Rt and Lt heart cath

## 2016-05-01 NOTE — Assessment & Plan Note (Signed)
Last SCr 1.42 

## 2016-05-01 NOTE — Progress Notes (Addendum)
05/01/2016 Ann Potter   October 14, 1940  [id number]  Primary Physician Josue Hector, MD Primary Cardiologist: Dr Wyline Mood  HPI:  75 y.o.female followed by Dr Wyline Mood with obesity, AF, HTN, diastolic CHF, MS and TR. She was recently admitted for Rt and Lt heart cath. This showed minor CAD, moderate MS, and moderate TR with pulmonary HTN-PA 45 mmHg. She is in the office for post cath follow up. She presents in a wheel chair. She has arthritis and recently was placed on steroids by her PCP. She has gained about 10 lbs over her baseline but otherwise denies orthopnea or increased dyspnea. She has chronic LE edema which she says is worse.    Current Outpatient Prescriptions  Medication Sig Dispense Refill  . acetaminophen (TYLENOL) 650 MG CR tablet Take 650 mg by mouth every 8 (eight) hours as needed for pain.    Marland Kitchen albuterol (PROVENTIL HFA;VENTOLIN HFA) 108 (90 BASE) MCG/ACT inhaler Inhale 2 puffs into the lungs every 6 (six) hours as needed. For shortness of breath    . diltiazem (CARDIZEM) 30 MG tablet Take 1 tablet (30 mg total) by mouth 2 (two) times daily. 180 tablet 3  . Fluticasone-Salmeterol (ADVAIR) 250-50 MCG/DOSE AEPB Inhale 1 puff into the lungs 2 (two) times daily.    Marland Kitchen levothyroxine (SYNTHROID, LEVOTHROID) 50 MCG tablet Take 50 mcg by mouth daily.    Marland Kitchen loratadine (CLARITIN) 10 MG tablet Take 10 mg by mouth as needed for allergies.     . metolazone (ZAROXOLYN) 2.5 MG tablet Take 1 tab every other week 30 tablet 3  . montelukast (SINGULAIR) 10 MG tablet Take 10 mg by mouth daily.     . potassium chloride SA (K-DUR,KLOR-CON) 20 MEQ tablet Take 2 tablets (40 mEq total) by mouth 2 (two) times daily. 60 tablet 10  . rivaroxaban (XARELTO) 20 MG TABS tablet Take 1 tablet (20 mg total) by mouth daily with supper. 14 tablet 0  . rosuvastatin (CRESTOR) 10 MG tablet Take 10 mg by mouth daily.    Marland Kitchen torsemide (DEMADEX) 20 MG tablet Take 4 tablets (80 mg total) by mouth 2 (two) times  daily. 240 tablet 6   No current facility-administered medications for this visit.     Allergies  Allergen Reactions  . Relafen  [Nabumetone] Other (See Comments)    Bladder pain    Social History   Social History  . Marital status: Divorced    Spouse name: N/A  . Number of children: N/A  . Years of education: N/A   Occupational History  . Not on file.   Social History Main Topics  . Smoking status: Former Smoker    Packs/day: 1.00    Years: 11.00    Types: Cigarettes    Start date: 03/08/1960  . Smokeless tobacco: Never Used     Comment: "quit smoking in ~  1990"  . Alcohol use No  . Drug use: No  . Sexual activity: Not Currently   Other Topics Concern  . Not on file   Social History Narrative   Has 2 grown children   Lives alone     Review of Systems: General: negative for chills, fever, night sweats or weight changes.  Cardiovascular: negative for chest pain, orthopnea, palpitations, paroxysmal nocturnal dyspnea or shortness of breath Dermatological: negative for rash Respiratory: negative for cough or wheezing Urologic: negative for hematuria Abdominal: negative for nausea, vomiting, diarrhea, bright red blood per rectum, melena, or hematemesis Neurologic: negative for visual changes, syncope, or  dizziness All other systems reviewed and are otherwise negative except as noted above.    Blood pressure (!) 144/78, pulse 90, height 5\' 2"  (1.575 m), weight 241 lb (109.3 kg), SpO2 98 %.  General appearance: alert, cooperative, no distress and morbidly obese Lungs: clear to auscultation bilaterally Heart: irregularly irregular rhythm and soft systolic murmur Extremities: 2-3+ edema Neurologic: Grossly normal   ASSESSMENT AND PLAN:   Valvular heart disease Pt has moderate MS and TR by recent Rt and Lt heart cath  Diastolic heart failure (HCC) She appears to be 10 lbs over her "dry weight"  Chronic atrial fibrillation (HCC) Rate controlled. She is on  Xarelto  HTN (hypertension) Controlled  Chronic renal insufficiency, stage III (moderate) Last SCr 1.42  Pulmonary hypertension PA 45 mmHg  CAD (coronary artery disease) Minor CAD at cath  Morbid obesity (HCC) BMI 41   PLAN  I think the steroids recently adde may have caused some fluid retention.  I suggested she take one of her Metolazone pills- her last one was 6 days ago. She has a follow up with Dr Wyline Mood in Sept to outline medical vs surgical options.   [facility] PA-C 05/01/2016 1:34 PM    I have reviewed all recent studies. She has moderate to severe MS. Severe TR, her recent TTE understates the TR but her TEE 05/2015 shows quite impressive TR. Signifiacnt pulm HTN by echo and RHC.  Given her TR I do not believe she would be a candidate for MV valvuloplasty, may need MVR and TR ring. We will refer to CT surgery to get there thoughts about her candidacy.   Dominga Ferry MD

## 2016-05-01 NOTE — Patient Instructions (Addendum)
Medication Instructions:  Your physician recommends that you continue on your current medications as directed. Please refer to the Current Medication list given to you today.  ------ Tomorrow -- Take you Metolazone 30 minutes before your Demedex  ---------  Labwork: None ordered  Testing/Procedures: None ordered  Follow-Up: Keep your follow up in September with Dr. Wyline Mood.  If you need a refill on your cardiac medications before your next appointment, please call your pharmacy.  Thank you for choosing [facility]!!

## 2016-05-01 NOTE — Assessment & Plan Note (Signed)
BMI 41 

## 2016-05-01 NOTE — Assessment & Plan Note (Signed)
She appears to be 10 lbs over her "dry weight"

## 2016-05-01 NOTE — Assessment & Plan Note (Signed)
Rate controlled. She is on Xarelto

## 2016-05-01 NOTE — Assessment & Plan Note (Signed)
PA 45 mmHg

## 2016-05-01 NOTE — Assessment & Plan Note (Signed)
Controlled.  

## 2016-05-01 NOTE — Assessment & Plan Note (Signed)
Minor CAD at cath

## 2016-05-04 ENCOUNTER — Telehealth: Payer: Self-pay

## 2016-05-04 DIAGNOSIS — I38 Endocarditis, valve unspecified: Secondary | ICD-10-CM

## 2016-05-04 NOTE — Telephone Encounter (Signed)
Pt made aware, placed referral with CT surgeon Dr. Cornelius Moraswen.

## 2016-05-04 NOTE — Telephone Encounter (Signed)
-----   Message from Ann PocheJonathan F Branch, MD sent at 05/01/2016  7:39 PM EDT ----- Please let patient know I have reviewed all her studies, I would like her to keep with Dr Cornelius Moraswen CT surgery to just disucss possible surgical options. Her mitral valve does not open well and her tricuspid valve is very leaky, and she may require an intervention on both of them. Please place referral  J BrancH MD

## 2016-05-15 ENCOUNTER — Institutional Professional Consult (permissible substitution) (INDEPENDENT_AMBULATORY_CARE_PROVIDER_SITE_OTHER): Payer: Medicare Other | Admitting: Thoracic Surgery (Cardiothoracic Vascular Surgery)

## 2016-05-15 ENCOUNTER — Encounter: Payer: Self-pay | Admitting: Thoracic Surgery (Cardiothoracic Vascular Surgery)

## 2016-05-15 ENCOUNTER — Other Ambulatory Visit: Payer: Self-pay | Admitting: *Deleted

## 2016-05-15 VITALS — BP 134/70 | HR 87 | Resp 16 | Ht 61.0 in | Wt 230.0 lb

## 2016-05-15 DIAGNOSIS — I00 Rheumatic fever without heart involvement: Secondary | ICD-10-CM

## 2016-05-15 DIAGNOSIS — I050 Rheumatic mitral stenosis: Secondary | ICD-10-CM | POA: Diagnosis not present

## 2016-05-15 DIAGNOSIS — I5032 Chronic diastolic (congestive) heart failure: Secondary | ICD-10-CM

## 2016-05-15 DIAGNOSIS — I482 Chronic atrial fibrillation, unspecified: Secondary | ICD-10-CM

## 2016-05-15 DIAGNOSIS — I071 Rheumatic tricuspid insufficiency: Secondary | ICD-10-CM | POA: Diagnosis not present

## 2016-05-15 DIAGNOSIS — Z01818 Encounter for other preprocedural examination: Secondary | ICD-10-CM

## 2016-05-15 DIAGNOSIS — I099 Rheumatic heart disease, unspecified: Secondary | ICD-10-CM

## 2016-05-15 DIAGNOSIS — I10 Essential (primary) hypertension: Secondary | ICD-10-CM

## 2016-05-15 DIAGNOSIS — I872 Venous insufficiency (chronic) (peripheral): Secondary | ICD-10-CM

## 2016-05-15 DIAGNOSIS — I059 Rheumatic mitral valve disease, unspecified: Secondary | ICD-10-CM | POA: Diagnosis not present

## 2016-05-15 DIAGNOSIS — I272 Other secondary pulmonary hypertension: Secondary | ICD-10-CM | POA: Diagnosis not present

## 2016-05-15 DIAGNOSIS — I340 Nonrheumatic mitral (valve) insufficiency: Secondary | ICD-10-CM

## 2016-05-15 DIAGNOSIS — I7409 Other arterial embolism and thrombosis of abdominal aorta: Secondary | ICD-10-CM

## 2016-05-15 DIAGNOSIS — I38 Endocarditis, valve unspecified: Secondary | ICD-10-CM

## 2016-05-15 NOTE — Patient Instructions (Signed)
Continue all previous medications without any changes at this time  

## 2016-05-15 NOTE — Progress Notes (Signed)
[address]       [name] [id number]             [phone]     CARDIOTHORACIC SURGERY CONSULTATION REPORT  Referring Provider is Branch, Dorothe PeaJonathan F, MD PCP is Josue HectorNYLAND,LEONARD ROBERT, MD  Chief Complaint  Patient presents with  . Mitral Stenosis    ECHO 03/19/16, CATH 04/16/16  . Tricuspid Regurgitation    HPI:  Patient is a 75 year old morbidly obese female with history of rheumatic fever during childhood, long-standing rheumatic heart disease with mitral stenosis, chronic persistent atrial fibrillation on long-term anticoagulation, chronic diastolic congestive heart failure, hypertension, pulmonary hypertension, tricuspid regurgitation, stage III chronic kidney disease, and venous insufficiency with severe chronic lower extremity edema who has been referred for surgical consultation to discuss treatment options for management of rheumatic mitral stenosis and tricuspid regurgitation. The patient states that she had rheumatic fever when she was a child and was ill for several months. She has been diagnosed with a heart murmur most of her life. She has been diagnosed with rheumatic mitral stenosis and chronic persistent atrial fibrillation for many years. She was followed for a while by Dr. Andee LinemaneGent but then lost to follow-up. She moved to [place] to live with her son for a while but was not seen by a cardiologist. More recently she returned to this area to be near her daughter for the last couple of years she has been followed by Dr. Wyline MoodBranch.  The patient states that she has been doing better over the past year or so since Dr. Wyline MoodBranch began to manage her long-term medical therapy. However, management of the patient's chronic diastolic congestive heart failure has been challenging.  Previous transesophageal echocardiogram performed 06/26/2015 revealed rheumatic mitral valve disease with at least moderate mitral stenosis. Mean transvalvular gradient was estimated 8 mmHg  corresponding to mitral valve area estimated 1.3 cm by pressure half-time.  Follow-up transthoracic echocardiogram performed 03/19/2016 revealed normal left ventricular size and systolic function. There was rheumatic mitral stenosis with severe thickening and restricted leaflet mobility involving both leaflets of the mitral valve. There was mild calcification of the leaflets with significant annular calcification. There was moderate to severe mitral stenosis with mean transvalvular gradient estimated 9 mmHg corresponding to valve area estimated 1.02 cm.  The patient subsequently underwent left and right heart catheterization by Dr. Excell Seltzerooper on 04/16/2016. She was found to have mild nonobstructive coronary artery disease. There was moderate pulmonary hypertension with PA pressures measured 62/30 and mean pulmonary artery pressure 45 mmHg. Central venous pressure was 12.  There was at least moderate mitral stenosis with peak to peak gradient 12 mmHg, mean transvalvular gradient 14.8 mmHg, and mitral valve area calculated 1.39 cm.  The patient was referred for surgical consultation.  The patient is single and lives alone in a assisted living facility in [place], [place].  Her daughter lives nearby and is supportive. The patient lives a sedentary lifestyle and has not been very active physically for more than 10 years. Her physical mobility and activity are limited by exertional shortness of breath, chronic pain and swelling in both legs and feet, and poor eyesight. She walks using a walker when she has to travel any significant distance. She gets short of breath with moderate activity. She cannot lay flat in bed. She is comfortable breathing at rest. She has never had any chest pain or chest tightness with activity. She has chronic lower extremity edema. She has had dizzy spells  without syncope. She has palpitations and known history of atrial fibrillation. She is very hard of hearing and has poor eyesight.  The patient  has been anticoagulated using warfarin in the past but is currently anticoagulated using Xarelto because of difficulty maintaining therapeutic anticoagulation on warfarin.   Past Medical History:  Diagnosis Date  . Anxiety   . Arthritis    "feet & legs" (04/16/2016)  . Asthma   . Atrial fibrillation (HCC)   . Atrial fibrillation, chronic (HCC) 01/25/2012   Status post DCCV, June 2013  . CAD (coronary artery disease)    Nonobstructive, by catheterization 4/13  . CHF (congestive heart failure) (HCC)   . Chronic diastolic congestive heart failure (HCC) 01/25/2012  . Chronic lower back pain   . Chronic renal insufficiency, stage III (moderate) 01/25/2012  . Chronic venous insufficiency   . COPD (chronic obstructive pulmonary disease) (HCC)   . Heart murmur   . HLD (hyperlipidemia)   . HTN (hypertension)   . Hypothyroidism   . Lower extremity edema   . Mitral stenosis    Mild, by catheterization 4/13  . Morbid obesity (HCC)   . Myocardial infarction (HCC) 2007  . Pulmonary hypertension (HCC)    Moderate, by catheterization 4/13  . Rheumatic fever during childhood    Age [age]  . Rheumatic heart disease   . Tricuspid regurgitation   . Worries     Past Surgical History:  Procedure Laterality Date  . CARDIAC CATHETERIZATION  12/2011; 04/16/2016  . CARDIAC CATHETERIZATION N/A 04/16/2016   Procedure: Right/Left Heart Cath and Coronary Angiography;  Surgeon: Tonny Bollman, MD;  Location: [facility] INVASIVE CV LAB;  Service: Cardiovascular;  Laterality: N/A;  . CATARACT EXTRACTION W/ INTRAOCULAR LENS  IMPLANT, BILATERAL Bilateral   . MULTIPLE TOOTH EXTRACTIONS  1965  . TEE WITH CARDIOVERSION     3 year ago  . TEE WITHOUT CARDIOVERSION N/A 06/25/2015   Procedure: TRANSESOPHAGEAL ECHOCARDIOGRAM (TEE);  Surgeon: Antoine Poche, MD;  Location: [facility] ENDO SUITE;  Service: Cardiology;  Laterality: N/A;  moved to 8:30 - Terri calling pt & Bernie  . TUBAL LIGATION  1984    Family History  Problem  Relation Age of Onset  . Heart disease Father 66  . Deep vein thrombosis Father   . Cancer Mother     right breast   . Cancer Sister     brain cancer  . Stomach cancer Brother     Social History   Social History  . Marital status: Divorced    Spouse name: N/A  . Number of children: N/A  . Years of education: N/A   Occupational History  . Not on file.   Social History Main Topics  . Smoking status: Former Smoker    Packs/day: 1.00    Years: 11.00    Types: Cigarettes    Start date: 03/08/1960  . Smokeless tobacco: Never Used     Comment: "quit smoking in ~  1990"  . Alcohol use No  . Drug use: No  . Sexual activity: Not Currently   Other Topics Concern  . Not on file   Social History Narrative   Has 2 grown children   Lives alone    Current Outpatient Prescriptions  Medication Sig Dispense Refill  . acetaminophen (TYLENOL) 650 MG CR tablet Take 650 mg by mouth every 8 (eight) hours as needed for pain.    Marland Kitchen albuterol (PROVENTIL HFA;VENTOLIN HFA) 108 (90 BASE) MCG/ACT inhaler Inhale 2 puffs into the  lungs every 6 (six) hours as needed. For shortness of breath    . diltiazem (CARDIZEM) 30 MG tablet Take 1 tablet (30 mg total) by mouth 2 (two) times daily. 180 tablet 3  . Fluticasone-Salmeterol (ADVAIR) 250-50 MCG/DOSE AEPB Inhale 1 puff into the lungs 2 (two) times daily.    Marland Kitchen levothyroxine (SYNTHROID, LEVOTHROID) 50 MCG tablet Take 50 mcg by mouth daily.    Marland Kitchen loratadine (CLARITIN) 10 MG tablet Take 10 mg by mouth as needed for allergies.     . metolazone (ZAROXOLYN) 2.5 MG tablet Take 1 tab every other week 30 tablet 3  . montelukast (SINGULAIR) 10 MG tablet Take 10 mg by mouth daily.     . potassium chloride SA (K-DUR,KLOR-CON) 20 MEQ tablet Take 2 tablets (40 mEq total) by mouth 2 (two) times daily. 60 tablet 10  . rivaroxaban (XARELTO) 20 MG TABS tablet Take 1 tablet (20 mg total) by mouth daily with supper. 14 tablet 0  . rosuvastatin (CRESTOR) 10 MG tablet Take  10 mg by mouth daily.    Marland Kitchen torsemide (DEMADEX) 20 MG tablet Take 4 tablets (80 mg total) by mouth 2 (two) times daily. (Patient taking differently: Take 80 mg by mouth 2 (two) times daily. 60 MG IN THE AM...40 MG IN THE PM) 240 tablet 6   No current facility-administered medications for this visit.     Allergies  Allergen Reactions  . Relafen  [Nabumetone] Other (See Comments)    Bladder pain      Review of Systems:   General:  normal appetite, decreased energy, + weight gain, + weight loss, no fever  Cardiac:  no chest pain with exertion, no chest pain at rest, +SOB with exertion, no resting SOB, no PND, + orthopnea, + palpitations, + arrhythmia, + atrial fibrillation, + LE edema, + dizzy spells, no syncope  Respiratory:  + shortness of breath, no home oxygen, + chronic productive cough, no dry cough, no bronchitis, + wheezing, no hemoptysis, no asthma, no pain with inspiration or cough, + sleep apnea, no CPAP at night  GI:   no difficulty swallowing, no reflux, no frequent heartburn, no hiatal hernia, no abdominal pain, no constipation, no diarrhea, no hematochezia, no hematemesis, no melena  GU:   no dysuria,  + frequency, + recurrent urinary tract infection, no hematuria, no kidney stones, + kidney disease  Vascular:  + pain suggestive of claudication, + pain in feet, no leg cramps, + varicose veins, no DVT, no non-healing foot ulcer  Neuro:   no stroke, no TIA's, no seizures, no headaches, no temporary blindness one eye,  no slurred speech, no peripheral neuropathy, + chronic pain, + instability of gait, + mild memory/cognitive dysfunction  Musculoskeletal: + arthritis, no joint swelling, no myalgias, + difficulty walking, limited mobility   Skin:   no rash, + itching, no skin infections, no pressure sores or ulcerations  Psych:   no anxiety, + depression, no nervousness, no unusual recent stress  Eyes:   no blurry vision, no floaters, + recent vision changes  ENT:   + severe hearing  loss, edentulous w/ full set dentures  Hematologic:  + easy bruising, no abnormal bleeding, no clotting disorder, no frequent epistaxis  Endocrine:  no diabetes, does not check CBG's at home     Physical Exam:   BP 134/70   Pulse 87   Resp 16   Ht 5\' 1"  (1.549 m)   Wt 230 lb (104.3 kg)   BMI 43.46 kg/m  General:  Obese elderly female NAD   HEENT:  Unremarkable   Neck:   no JVD, no bruits, no adenopathy   Chest:   clear to auscultation, symmetrical breath sounds, no wheezes, no rhonchi   CV:   Irregular rate and rhythm, no murmur   Abdomen:  soft, non-tender, no masses   Extremities:  warm, adequately-perfused, pulses not palpable , + severe bilateral LE edema with chronic skin changes c/w moderate-severe venous insufficiency  Rectal/GU  Deferred  Neuro:   Grossly non-focal and symmetrical throughout  Skin:   Clean and dry, no rashes, no breakdown   Diagnostic Tests:  Transesophageal Echocardiography  Patient:    Nashae, Maudlin MR #:       [id number] Study Date: 06/25/2015 Gender:     F Age:        74 Height:     152.4 cm Weight:     113.9 kg BSA:        2.27 m^2 Pt. Status: Room:   ADMITTING    Patrick Jupiter, M.D.  ATTENDING    Patrick Jupiter, M.D.  Lisette Abu, M.D.  REFERRING    Patrick Jupiter, M.D.  PERFORMING   [facility], [facility]king  SONOGRAPHER  Delbert Phenix, RCS  cc:  ------------------------------------------------------------------- LV EF: 55% -   60%  ------------------------------------------------------------------- Indications:      Mitral stenosis [non-rheumatic] 424.0.  ------------------------------------------------------------------- Study Conclusions  - Left ventricle: The cavity size was normal. Wall thickness was   normal. Systolic function was normal. The estimated ejection   fraction was in the range of 55% to 60%. - Aortic valve: There was mild regurgitation. The AI vena contracta   is 0.2 cm. - Mitral  valve: The findings are consistent with moderate stenosis.   There was mild regurgitation. Mean gradient (D): 8 mm Hg. Valve   area by pressure half-time: 1.3 cm^2. - Left atrium: The atrium was severely dilated. No evidence of   thrombus in the atrial cavity or appendage. - Right ventricle: The cavity size was moderately dilated. - Right atrium: The atrium was severely dilated. The interatrial   septum bowes to the left at times consistent with elevated RA   pressures. - Atrial septum: No defect or patent foramen ovale was identified. - Tricuspid valve: There was severe regurgitation. The TR vena   contracta is 0.7 cm. The TR is eccentric and poteriorally   directed. - Pulmonary arteries: Systolic pressure was severely increased.   PASP is at least 83 mmHg. IVC not visualized during this test,   cannot estimate RA pressure. - Technically adequate study.  Diagnostic transesophageal echocardiography.  2D and color Doppler.  Birthdate:  Patient birthdate: [birth year].  Age:  Patient is 75 yr old.  Sex:  Gender: female.    BMI: 49 kg/m^2.  Blood pressure: 141/78  Study date:  Study date: 06/25/2015. Study time: 08:23 AM. Location:  Endoscopy.  -------------------------------------------------------------------  ------------------------------------------------------------------- Left ventricle:  The cavity size was normal. Wall thickness was normal. Systolic function was normal. The estimated ejection fraction was in the range of 55% to 60%.  ------------------------------------------------------------------- Aortic valve:   Trileaflet; normal thickness leaflets.  Doppler: There was no stenosis.   There was mild regurgitation. The AI vena contracta is 0.2 cm.  ------------------------------------------------------------------- Aorta:  The visualized portions of the descending thoracic aorta show mild plaque. Aortic root: The aortic root was normal in  size.  ------------------------------------------------------------------- Mitral valve:   Doppler:   The findings are consistent  with moderate stenosis.   There was mild regurgitation.    Valve area by pressure half-time: 1.3 cm^2.    Mean gradient (D): 8 mm Hg. Peak gradient (D): 19 mm Hg.  ------------------------------------------------------------------- Left atrium:  The atrium was severely dilated.  No evidence of thrombus in the atrial cavity or appendage. There is very mild spontaneous contrast in the LA appendage.  ------------------------------------------------------------------- Atrial septum:  No defect or patent foramen ovale was identified.   ------------------------------------------------------------------- Right ventricle:  The cavity size was moderately dilated. Systolic function was normal.  ------------------------------------------------------------------- Pulmonic valve:   Not well visualized.  Doppler:   There was no evidence for stenosis.   There was mild regurgitation.  ------------------------------------------------------------------- Tricuspid valve:   Normal thickness leaflets.  Doppler:   There was no evidence for stenosis.   There was severe regurgitation. The TR vena contracta is 0.7 cm. The TR is eccentric and poteriorally directed.  ------------------------------------------------------------------- Pulmonary artery:   Systolic pressure was severely increased. PASP is at least 83 mmHg. IVC not visualized during this test, cannot estimate RA pressure.  ------------------------------------------------------------------- Right atrium:  The atrium was severely dilated. The interatrial septum bowes to the left at times consistent with elevated RA pressures.  ------------------------------------------------------------------- Pericardium:  There was no pericardial effusion.    ------------------------------------------------------------------- Post procedure conclusions Ascending Aorta:  - The visualized portions of the descending thoracic aorta show   mild plaque.  ------------------------------------------------------------------- Measurements   Aorta                            Value  Aortic arch ID                   35     mm    Mitral valve                     Value  Mitral E-wave peak velocity      219    cm/s  Mitral mean velocity, D          132.82 cm/s  Mitral mean gradient, D          8      mm Hg  Mitral peak gradient, D          19     mm Hg  Mitral valve area, PHT, DP       1.3    cm^2  Legend: (L)  and  (H)  mark values outside specified reference range.  ------------------------------------------------------------------- Prepared and Electronically Authenticated by  Patrick Jupiter, M.D. 2016-09-28T11:14:28    Transthoracic Echocardiography  Patient:    Cookie, Pore MR #:       [id number] Study Date: 03/19/2016 Gender:     F Age:        29 Height:     152.4 cm Weight:     106.6 kg BSA:        2.19 m^2 Pt. Status: Room:   SONOGRAPHER  Missy Church, RVT, RDCS  ATTENDING    Patrick Jupiter, M.D.  Lisette Abu, M.D.  REFERRING    Patrick Jupiter, M.D.  PERFORMING   [facility], [place]  cc:  ------------------------------------------------------------------- LV EF: 60% -   65%  ------------------------------------------------------------------- History:   PMH:  Acquired from the patient and from the patient&'s chart.  Dyspnea, murmur, and bilateral lower extremity edema.  ------------------------------------------------------------------- Study Conclusions  - Left ventricle: The cavity size was normal.  Wall thickness was   normal. Systolic function was normal. The estimated ejection   fraction was in the range of 60% to 65%. Wall motion was normal;   there were no regional wall motion  abnormalities. The study is   not technically sufficient to allow evaluation of LV diastolic   function. - Aortic valve: Mildly to moderately calcified annulus. Trileaflet;   mildly calcified leaflets. There was trivial regurgitation. - Mitral valve: Calcified annulus. Mildly thickened, mildly   calcified leaflets . Mobility was restricted with morphology   suggesting rheumatic mitral disease. The findings are consistent   with moderate to severe stenosis. There was trivial   regurgitation. Mean gradient (D): 9 mm Hg. Valve area by   continuity equation (using LVOT flow): 1.02 cm^2. - Left atrium: The atrium was moderately dilated. - Right ventricle: The cavity size was moderately dilated. Systolic   function was mildly reduced. - Right atrium: Central venous pressure (est): 8 mm Hg. - Tricuspid valve: There was mild-moderate regurgitation. - Pulmonary arteries: Systolic pressure was severely increased. PA   peak pressure: 75 mm Hg (S). - Pericardium, extracardiac: There was no pericardial effusion.  Impressions:  - Normal LV wall thickness with LVEF 60-65%. Indeterminate   diastolic function in setting of atrial fibrillation. Moderate   left atrial enlargement. Rheumatic mitral deformity with evidence   of moderate to severe mitral stenosis as outlined above and   trivial mitral regurgitation. Sclerotic aortic valve with trivial   aortic regurgitation. Moderate RV enlargement with mildly reduced   contraction. Mild to moderate tricuspid regurgitation with   evidence of severe pulmonary hypertension and PASP 75 mmHg.  Transthoracic echocardiography.  M-mode, complete 2D, spectral Doppler, and color Doppler.  Birthdate:  Patient birthdate: 17-Nov-1940.  Age:  Patient is 75 yr old.  Sex:  Gender: female. BMI: 45.9 kg/m^2.  Blood pressure:     110/68  Patient status: Outpatient.  Study date:  Study date: 03/19/2016. Study time:  03:15 PM.  -------------------------------------------------------------------  ------------------------------------------------------------------- Left ventricle:  The cavity size was normal. Wall thickness was normal. Systolic function was normal. The estimated ejection fraction was in the range of 60% to 65%. Wall motion was normal; there were no regional wall motion abnormalities. The study is not technically sufficient to allow evaluation of LV diastolic function.  ------------------------------------------------------------------- Aortic valve:   Mildly to moderately calcified annulus. Trileaflet; mildly calcified leaflets.  Doppler:  There was trivial regurgitation.  ------------------------------------------------------------------- Aorta:  Aortic root: The aortic root was normal in size.  ------------------------------------------------------------------- Mitral valve:   Calcified annulus. Mildly thickened, mildly calcified leaflets . Mobility was restricted with morphology suggesting rheumatic mitral disease.  Doppler:   The findings are consistent with moderate to severe stenosis.   There was trivial regurgitation.    Valve area by continuity equation (using LVOT flow): 1.02 cm^2. Indexed valve area by continuity equation (using LVOT flow): 0.47 cm^2/m^2.    Mean gradient (D): 9 mm Hg. Peak gradient (D): 22 mm Hg.  ------------------------------------------------------------------- Left atrium:  The atrium was moderately dilated.  ------------------------------------------------------------------- Right ventricle:  The cavity size was moderately dilated. Systolic function was mildly reduced.  ------------------------------------------------------------------- Pulmonic valve:   Poorly visualized.  Doppler:  There was trivial regurgitation.  ------------------------------------------------------------------- Tricuspid valve:   The valve appears to be  grossly normal. Doppler:  There was mild-moderate regurgitation.  ------------------------------------------------------------------- Pulmonary artery:   Systolic pressure was severely increased.  ------------------------------------------------------------------- Right atrium:  The atrium was normal in size.  ------------------------------------------------------------------- Pericardium:  There  was no pericardial effusion.  ------------------------------------------------------------------- Systemic veins: Inferior vena cava: The vessel was normal in size. The respirophasic diameter changes were blunted (< 50%).  ------------------------------------------------------------------- Measurements   Left ventricle                            Value          Reference  LV ID, ED, PLAX chordal                   45.3  mm       43 - 52  LV ID, ES, PLAX chordal                   29.4  mm       23 - 38  LV fx shortening, PLAX chordal            35    %        >=29  LV PW thickness, ED                       9.5   mm       ---------  IVS/LV PW ratio, ED                       0.95           <=1.3  Stroke volume, 2D                         75    ml       ---------  Stroke volume/bsa, 2D                     34    ml/m^2   ---------  Longitudinal strain, TDI                  19    %        ---------    Ventricular septum                        Value          Reference  IVS thickness, ED                         9     mm       ---------    LVOT                                      Value          Reference  LVOT ID, S                                19    mm       ---------  LVOT area                                 2.84  cm^2     ---------  LVOT peak velocity, S  120   cm/s     ---------  LVOT mean velocity, S                     81.3  cm/s     ---------  LVOT VTI, S                               26.5  cm       ---------  LVOT peak gradient, S                     6     mm  Hg    ---------    Aorta                                     Value          Reference  Aortic root ID, ED                        35    mm       ---------    Left atrium                               Value          Reference  LA ID, A-P, ES                            45    mm       ---------  LA ID/bsa, A-P                            2.05  cm/m^2   <=2.2  LA volume, S                              73.5  ml       ---------  LA volume/bsa, S                          33.5  ml/m^2   ---------  LA volume, ES, 1-p A4C                    99.4  ml       ---------  LA volume/bsa, ES, 1-p A4C                45.3  ml/m^2   ---------  LA volume, ES, 1-p A2C                    52.4  ml       ---------  LA volume/bsa, ES, 1-p A2C                23.9  ml/m^2   ---------    Mitral valve                              Value          Reference  Mitral E-wave peak velocity  237   cm/s     ---------  Mitral A-wave peak velocity               57.6  cm/s     ---------  Mitral mean velocity, D                   132   cm/s     ---------  Mitral deceleration time           (H)    324   ms       150 - 230  Mitral mean gradient, D                   9     mm Hg    ---------  Mitral peak gradient, D                   22    mm Hg    ---------  Mitral E/A ratio, peak                    4.1            ---------  Mitral valve area, LVOT continuity        1.02  cm^2     ---------  Mitral valve area/bsa, LVOT               0.47  cm^2/m^2 ---------  continuity  Mitral annulus VTI, D                     73.8  cm       ---------    Pulmonary arteries                        Value          Reference  PA pressure, S, DP                 (H)    75    mm Hg    <=30    Tricuspid valve                           Value          Reference  Tricuspid regurg peak velocity            409   cm/s     ---------  Tricuspid peak RV-RA gradient             67    mm Hg    ---------    Systemic veins                             Value          Reference  Estimated CVP                             8     mm Hg    ---------    Right ventricle                           Value          Reference  RV pressure, S, DP                 (  H)    75    mm Hg    <=30    Pulmonic valve                            Value          Reference  Pulmonic valve peak velocity, S           147   cm/s     ---------  Pulmonic regurg velocity, ED              116   cm/s     ---------  Pulmonic regurg gradient, ED              5     mm Hg    ---------  Legend: (L)  and  (H)  mark values outside specified reference range.  ------------------------------------------------------------------- Prepared and Electronically Authenticated by  Nona DellSamuel McDowell, M.D. 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  I have personally reviewed the patient's recent transthoracic echocardiogram and the transesophageal echocardiogram performed last year. 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 There is very mild mitral regurgitation. There is some leaflet calcification and moderate calcification of the mitral annulus. There is severe left atrial enlargement. There is normal left ventricular size and systolic function. Right ventricle is mildly dilated. There is at least moderate tricuspid regurgitation.  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 The patient's pulmonary hypertension and chronic symptoms of congestive  heart failure or further exacerbated by the presence of chronic persistent atrial fibrillation that has failed DC cardioversion and morbid obesity. Options include continued medical therapy versus mitral valve replacement, tricuspid valve repair, and possible Maze procedure. Risks associated with surgery may be fairly high in this patient because of her advanced age, numerous comorbid medical problems, and significant physical deconditioning.   Plan:  I have discussed the nature of the patient's clinical problems at length with the patient and her daughter in the office today.  The indications, risks, and potential benefits of mitral valve replacement, tricuspid valve repair, and possible maze procedure were reviewed at length. Expectations with continued medical therapy were discussed.  All of their questions have been addressed.  As a next step the patient will undergo pulmonary function tests, CT angiography, and a formal physical therapy evaluation to further stratify risks associated with surgery.  The patient will return to our office in a proximally 3 weeks to review the results of these tests and discuss treatment options further.  I spent in excess of 90 minutes during the conduct of this office consultation and >50% of this time involved direct face-to-face encounter with the patient for counseling and/or coordination of their care.   Salvatore Decentlarence H. Cornelius Moraswen, MD 05/15/2016 4:38 PM

## 2016-05-21 ENCOUNTER — Telehealth: Payer: Self-pay | Admitting: *Deleted

## 2016-05-21 MED ORDER — METOLAZONE 2.5 MG PO TABS: ORAL_TABLET | ORAL | 3 refills | Status: AC

## 2016-05-21 NOTE — Telephone Encounter (Signed)
Ok to fill prescription. I would ask her to take every other week, however if significant weight gain or swelling ok to take once weekly  J Lan Entsminger MD

## 2016-05-21 NOTE — Telephone Encounter (Signed)
Patient informed and verbalized understanding of plan. 

## 2016-05-21 NOTE — Telephone Encounter (Signed)
Patient called office requesting a new rx for metolazone2.5 mg weekly. Nurse advised patient that her instructions are for every other week. Patient said she was advised by the last provider she saw to take metolazone every week. Nurse advised patient that message would be sent to her doctor for clarification.

## 2016-05-22 ENCOUNTER — Encounter (HOSPITAL_COMMUNITY): Payer: Medicare Other

## 2016-05-22 ENCOUNTER — Ambulatory Visit (HOSPITAL_COMMUNITY): Payer: Medicare Other

## 2016-05-27 ENCOUNTER — Encounter: Payer: Self-pay | Admitting: Physical Therapy

## 2016-05-27 ENCOUNTER — Ambulatory Visit: Payer: Medicare Other | Attending: Thoracic Surgery (Cardiothoracic Vascular Surgery) | Admitting: Physical Therapy

## 2016-05-27 DIAGNOSIS — R293 Abnormal posture: Secondary | ICD-10-CM | POA: Diagnosis present

## 2016-05-27 DIAGNOSIS — M6281 Muscle weakness (generalized): Secondary | ICD-10-CM | POA: Insufficient documentation

## 2016-05-27 DIAGNOSIS — R2689 Other abnormalities of gait and mobility: Secondary | ICD-10-CM | POA: Insufficient documentation

## 2016-05-27 NOTE — Therapy (Signed)
[facility] [address] [place], [place], [address] Phone: [phone]   Fax:  [phone]  Physical Therapy Evaluation  Patient Details  Name: Ann Potter MRN: [id number] Date of Birth: Aug 26, 1941 Referring Provider: Dr. Tressie Stalker  Encounter Date: 05/27/2016      PT End of Session - 05/27/16 1250    Visit Number 1   PT Start Time 1248   PT Stop Time 1355   PT Time Calculation (min) 67 min      Past Medical History:  Diagnosis Date  . Anxiety   . Arthritis    "feet & legs" (04/16/2016)  . Asthma   . Atrial fibrillation (HCC)   . Atrial fibrillation, chronic (HCC) 01/25/2012   Status post DCCV, June 2013  . CAD (coronary artery disease)    Nonobstructive, by catheterization 4/13  . CHF (congestive heart failure) (HCC)   . Chronic diastolic congestive heart failure (HCC) 01/25/2012  . Chronic lower back pain   . Chronic renal insufficiency, stage III (moderate) 01/25/2012  . Chronic venous insufficiency   . COPD (chronic obstructive pulmonary disease) (HCC)   . Heart murmur   . HLD (hyperlipidemia)   . HTN (hypertension)   . Hypothyroidism   . Lower extremity edema   . Mitral stenosis    Mild, by catheterization 4/13  . Morbid obesity (HCC)   . Myocardial infarction (HCC) 2007  . Pulmonary hypertension (HCC)    Moderate, by catheterization 4/13  . Rheumatic fever during childhood    Age 55-9  . Rheumatic heart disease   . Tricuspid regurgitation   . Worries     Past Surgical History:  Procedure Laterality Date  . CARDIAC CATHETERIZATION  12/2011; 04/16/2016  . CARDIAC CATHETERIZATION N/A 04/16/2016   Procedure: Right/Left Heart Cath and Coronary Angiography;  Surgeon: Tonny Bollman, MD;  Location: [facility] INVASIVE CV LAB;  Service: Cardiovascular;  Laterality: N/A;  . CATARACT EXTRACTION W/ INTRAOCULAR LENS  IMPLANT, BILATERAL Bilateral   . MULTIPLE TOOTH EXTRACTIONS  1965  . TEE WITH CARDIOVERSION     3  year ago  . TEE WITHOUT CARDIOVERSION N/A 06/25/2015   Procedure: TRANSESOPHAGEAL ECHOCARDIOGRAM (TEE);  Surgeon: Antoine Poche, MD;  Location: [facility];  Service: Cardiology;  Laterality: N/A;  moved to 8:30 - Terri calling pt & Bernie  . TUBAL LIGATION  1984    There were no vitals filed for this visit.       Subjective Assessment - 05/27/16 1256    Subjective reports she had the flu over the last couple of weeks, felt better over the last 2-3 days; hx heart murmur, increasing exertional shortnesss of breath, swelling in legs & feet, denies dizziness or chest pain; also reports rapid heart rate which began yesterday - "it hasn't done this in a while"   Pertinent History poor eyesight, HOH, "bad disc" in back, leg and feet edema   Patient Stated Goals fix my heart   Currently in Pain? Yes  PT to monitor pain but no pain goal to follow   Pain Score 4    Pain Location Foot   Pain Orientation Right;Left   Pain Descriptors / Indicators Stabbing   Pain Type Chronic pain   Pain Onset More than a month ago   Pain Frequency Constant   Aggravating Factors  moving a lot   Pain Relieving Factors stretching it            [facility] PT Assessment - 05/27/16 0001  Assessment   Medical Diagnosis mitral regugitation, tricuspid regurgitation   Referring Provider Dr. Tressie Stalkerlarence Owen   Onset Date/Surgical Date 05/15/16     Precautions   Precautions Fall   Precaution Comments no heavy lifting     Restrictions   Weight Bearing Restrictions No     Balance Screen   Has the patient fallen in the past 6 months No   Has the patient had a decrease in activity level because of a fear of falling?  No   Is the patient reluctant to leave their home because of a fear of falling?  No     Home Environment   Living Environment Assisted living   Home Equipment Walker - 4 wheels   Additional Comments independent living apartment with pull cords; sister assists with groceries     Prior  Function   Level of Independence Independent with community mobility with device     Observation/Other Assessments-Edema    Edema --  moderate to severe edema noted bil lower extremities     Posture/Postural Control   Posture/Postural Control Postural limitations   Postural Limitations Forward head;Rounded Shoulders;Increased lumbar lordosis     ROM / Strength   AROM / PROM / Strength AROM;Strength     AROM   Overall AROM Comments grossly WNL UE and lower extremities     Strength   Overall Strength Comments UE 4 to 5/5; lower extremities 4-/5   Strength Assessment Site Hand   Right/Left hand Right;Left   Right Hand Grip (lbs) 45  R hand dominant   Left Hand Grip (lbs) 40     Ambulation/Gait   Assistive device 4-wheeled walker   Gait Pattern Trunk flexed;Decreased step length - right;Decreased step length - left;Poor foot clearance - left;Poor foot clearance - right  right foot ext rot          [facility] Pre-Surgical Assessment - 05/27/16 0001    5 Meter Walk Test- trial 1 8 sec   5 Meter Walk Test- trial 2 10 sec.    5 Meter Walk Test- trial 3 8 sec.  >6 sec considered slow speed   5 meter walk test average 8.67 sec   Timed Up & Go Test trial  20 sec.  with 4 wheeled RW   Comments >12 seconds indicates increased fall risk   4 Stage Balance Test tolerated for:  10 sec.   4 Stage Balance Test Position 2   comment inability to hold position 3 x10 seconds indicates increased fall risk   Comment unable, requires UE support   ADL/IADL Independent with: Bathing;Dressing;Meal prep;Finances   ADL/IADL Needs Assistance with: [employer] work   ADL/IADL Fraility Index Vulnerable   6 Minute Walk- Baseline yes   BP (mmHg) 158/70   HR (bpm) 101   02 Sat (%RA) 97 %   Modified Borg Scale for Dyspnea 0- Nothing at all   Perceived Rate of Exertion (Borg) 6-   6 Minute Walk Post Test yes   BP (mmHg) 178/78   HR (bpm) 124   02 Sat (%RA) 92 %   Modified Borg Scale for Dyspnea 0- Nothing at  all   Perceived Rate of Exertion (Borg) 9- very light   Aerobic Endurance Distance Walked 325   Endurance additional comments Pt required extended seated rest break at 2 minutes (185') due to fatigue and elevated heart rate. Rested 2:30 before resuming for last one and a half minutes of the 6 minute walk test.  Plan - 06-26-16 1404    Clinical Impression Statement Pt is a 75 yo female presenting to OP PT for eval prior to possible MVR due to mitral regurgitation and tricuspid regurgitation. Pt has a hx of heart murmur and had rheumatic fever as a child. She has been followed by cardiology for a long time. Pt reports that her heart rate has been up since yesterday and "it hasn't done that in a while". Monitored closely throughout evaluation. It quickly reached 120-125 bpm with short distance walking. Pt able to ambulate continuously x 2 minutes during 6 minute walk before requiring a 2 and a half minute seated rest. Pt presents with good UE strength and fair lower extremity strength, AROM WFL, slow walking speed and poor balance. Pt able to ambulate 21% of a normal walking distance for her age and gender (79% limited).  Pt reports using the 4 wheeled RW for about 4 years now and is also limited by poor vision. Pt is very hard of hearing and hears better via right ear.    PT Frequency One time visit   Consulted and Agree with Plan of Care Patient      Patient demonstrated the following deficits and impairments:     Visit Diagnosis: Other abnormalities of gait and mobility - Plan: PT plan of care cert/re-cert  Abnormal posture - Plan: PT plan of care cert/re-cert  Muscle weakness (generalized) - Plan: PT plan of care cert/re-cert      G-Codes - 06-26-16 1412    Functional Assessment Tool Used 6 minute walk 325'   Functional Limitation Mobility: Walking and moving around   Mobility: Walking and Moving Around Current Status  [phone]) At least 60 percent but less than 80 percent impaired, limited or restricted   Mobility: Walking and Moving Around Goal Status [phone]) At least 60 percent but less than 80 percent impaired, limited or restricted   Mobility: Walking and Moving Around Discharge Status [phone]) At least 60 percent but less than 80 percent impaired, limited or restricted       Problem List Patient Active Problem List   Diagnosis Date Noted  . Tricuspid regurgitation   . Rheumatic heart disease   . Rheumatic fever during childhood   . Chronic venous insufficiency   . Morbid obesity (HCC) 05/01/2016  . Valvular heart disease   . Chronic atrial fibrillation (HCC)   . Visual field defect 04/08/2012  . Atrial fibrillation, chronic (HCC) 01/25/2012  . Mitral stenosis 01/25/2012  . CAD (coronary artery disease) 01/25/2012  . Pulmonary hypertension (HCC) 01/25/2012  . Chronic diastolic congestive heart failure (HCC) 01/25/2012  . Lower extremity edema 01/25/2012  . HTN (hypertension) 01/25/2012  . Chronic renal insufficiency, stage III (moderate) 01/25/2012  . Long term (current) use of anticoagulants 01/25/2012    Pebbles Zeiders, PT 2016-06-26, 2:14 PM  [facility] [address] [place], [place], [address] Phone: [phone]   Fax:  [phone]  Name: Marjie Chea MRN: [id number] Date of Birth: 07/31/1941

## 2016-05-28 ENCOUNTER — Ambulatory Visit (HOSPITAL_COMMUNITY)
Admission: RE | Admit: 2016-05-28 | Discharge: 2016-05-28 | Disposition: A | Discharge status: Home / Self Care | Payer: Medicare Other | Source: Ambulatory Visit | Attending: Thoracic Surgery (Cardiothoracic Vascular Surgery) | Admitting: Thoracic Surgery (Cardiothoracic Vascular Surgery)

## 2016-05-28 ENCOUNTER — Encounter (HOSPITAL_COMMUNITY): Payer: Self-pay

## 2016-05-28 DIAGNOSIS — I517 Cardiomegaly: Secondary | ICD-10-CM | POA: Insufficient documentation

## 2016-05-28 DIAGNOSIS — I38 Endocarditis, valve unspecified: Secondary | ICD-10-CM

## 2016-05-28 DIAGNOSIS — I7409 Other arterial embolism and thrombosis of abdominal aorta: Secondary | ICD-10-CM

## 2016-05-28 DIAGNOSIS — I340 Nonrheumatic mitral (valve) insufficiency: Secondary | ICD-10-CM

## 2016-05-28 DIAGNOSIS — Z01818 Encounter for other preprocedural examination: Secondary | ICD-10-CM | POA: Diagnosis not present

## 2016-05-28 DIAGNOSIS — K760 Fatty (change of) liver, not elsewhere classified: Secondary | ICD-10-CM | POA: Diagnosis not present

## 2016-05-28 DIAGNOSIS — I272 Other secondary pulmonary hypertension: Secondary | ICD-10-CM | POA: Diagnosis not present

## 2016-05-28 DIAGNOSIS — I2510 Atherosclerotic heart disease of native coronary artery without angina pectoris: Secondary | ICD-10-CM | POA: Insufficient documentation

## 2016-05-28 DIAGNOSIS — I700 Atherosclerosis of aorta: Secondary | ICD-10-CM | POA: Insufficient documentation

## 2016-05-28 DIAGNOSIS — K5730 Diverticulosis of large intestine without perforation or abscess without bleeding: Secondary | ICD-10-CM | POA: Diagnosis not present

## 2016-05-28 LAB — PULMONARY FUNCTION TEST
DL/VA % PRED: 98 %
DL/VA: 4.01 ml/min/mmHg/L
DLCO unc % pred: 71 %
DLCO unc: 12.49 ml/min/mmHg
FEF 25-75 POST: 0.97 L/s
FEF 25-75 Pre: 1.06 L/sec
FEF2575-%CHANGE-POST: -8 %
FEF2575-%PRED-POST: 73 %
FEF2575-%PRED-PRE: 80 %
FEV1-%Change-Post: -4 %
FEV1-%PRED-PRE: 80 %
FEV1-%Pred-Post: 77 %
FEV1-PRE: 1.3 L
FEV1-Post: 1.24 L
FEV1FVC-%CHANGE-POST: -2 %
FEV1FVC-%PRED-PRE: 102 %
FEV6-%CHANGE-POST: -1 %
FEV6-%PRED-PRE: 82 %
FEV6-%Pred-Post: 81 %
FEV6-Post: 1.66 L
FEV6-Pre: 1.7 L
FEV6FVC-%Pred-Post: 106 %
FEV6FVC-%Pred-Pre: 106 %
FVC-%Change-Post: -1 %
FVC-%PRED-POST: 76 %
FVC-%PRED-PRE: 77 %
FVC-POST: 1.66 L
FVC-PRE: 1.7 L
POST FEV1/FVC RATIO: 75 %
POST FEV6/FVC RATIO: 100 %
PRE FEV1/FVC RATIO: 77 %
Pre FEV6/FVC Ratio: 100 %
RV % pred: 94 %
RV: 1.94 L
TLC % PRED: 99 %
TLC: 4.26 L

## 2016-05-28 LAB — BASIC METABOLIC PANEL
Anion gap: 13 (ref 5–15)
BUN: 32 mg/dL — AB (ref 6–20)
CALCIUM: 9.1 mg/dL (ref 8.9–10.3)
CO2: 31 mmol/L (ref 22–32)
CREATININE: 1.62 mg/dL — AB (ref 0.44–1.00)
Chloride: 94 mmol/L — ABNORMAL LOW (ref 101–111)
GFR calc Af Amer: 34 mL/min — ABNORMAL LOW (ref >60)
GFR, EST NON AFRICAN AMERICAN: 30 mL/min — AB (ref >60)
GLUCOSE: 133 mg/dL — AB (ref 65–99)
POTASSIUM: 3 mmol/L — AB (ref 3.5–5.1)
SODIUM: 138 mmol/L (ref 135–145)

## 2016-05-28 MED ORDER — IOPAMIDOL (ISOVUE-370) INJECTION 76%
INTRAVENOUS | Status: AC
Start: 2016-05-28 — End: 2016-05-28
  Administered 2016-05-28: 80 mL
  Filled 2016-05-28: qty 100

## 2016-05-28 MED ORDER — ALBUTEROL SULFATE (2.5 MG/3ML) 0.083% IN NEBU
2.5000 mg | INHALATION_SOLUTION | Freq: Once | RESPIRATORY_TRACT | Status: AC
  Administered 2016-05-28: 2.5 mg via RESPIRATORY_TRACT

## 2016-05-28 MED ORDER — SODIUM BICARBONATE BOLUS VIA INFUSION
INTRAVENOUS | Status: AC
  Administered 2016-05-28: 1 meq via INTRAVENOUS
  Filled 2016-05-28: qty 1

## 2016-05-28 MED ORDER — SODIUM BICARBONATE 8.4 % IV SOLN
INTRAVENOUS | Status: AC
  Administered 2016-05-28: 11:00:00 via INTRAVENOUS
  Filled 2016-05-28: qty 500

## 2016-06-02 ENCOUNTER — Ambulatory Visit (INDEPENDENT_AMBULATORY_CARE_PROVIDER_SITE_OTHER): Payer: Medicare Other | Admitting: Thoracic Surgery (Cardiothoracic Vascular Surgery)

## 2016-06-02 ENCOUNTER — Encounter: Payer: Self-pay | Admitting: Thoracic Surgery (Cardiothoracic Vascular Surgery)

## 2016-06-02 ENCOUNTER — Other Ambulatory Visit: Payer: Self-pay | Admitting: *Deleted

## 2016-06-02 ENCOUNTER — Telehealth: Payer: Self-pay | Admitting: Cardiology

## 2016-06-02 VITALS — BP 123/75 | HR 80 | Resp 20 | Ht 61.0 in | Wt 230.0 lb

## 2016-06-02 DIAGNOSIS — I050 Rheumatic mitral stenosis: Secondary | ICD-10-CM | POA: Diagnosis not present

## 2016-06-02 DIAGNOSIS — I5032 Chronic diastolic (congestive) heart failure: Secondary | ICD-10-CM

## 2016-06-02 DIAGNOSIS — I482 Chronic atrial fibrillation, unspecified: Secondary | ICD-10-CM

## 2016-06-02 DIAGNOSIS — I059 Rheumatic mitral valve disease, unspecified: Secondary | ICD-10-CM | POA: Diagnosis not present

## 2016-06-02 DIAGNOSIS — I071 Rheumatic tricuspid insufficiency: Secondary | ICD-10-CM

## 2016-06-02 DIAGNOSIS — I4891 Unspecified atrial fibrillation: Secondary | ICD-10-CM

## 2016-06-02 MED ORDER — GLUTARALDEHYDE 0.625% SOAKING SOLUTION: TOPICAL | Status: AC | PRN

## 2016-06-02 NOTE — Patient Instructions (Signed)
Patient has been instructed to stop taking Xarelto on Friday 9/22 and begin Lovenox injections on Sunday 9/24   Continue all other previous medications without any changes at this time

## 2016-06-02 NOTE — Telephone Encounter (Signed)
I would stay with the KCl 20mEq tablet, with her taking 2 tablets bid.   Dominga FerryJ Joei Frangos MD

## 2016-06-02 NOTE — Telephone Encounter (Signed)
[facility] called requesting to speak with nurse about a medication issue.

## 2016-06-02 NOTE — Telephone Encounter (Signed)
Pharmacy made aware

## 2016-06-02 NOTE — Telephone Encounter (Signed)
Pt is requesting from pharmacy potassium 20 MEQ 5 tablets daily. I don't see where this was changed as we have pt currently taking 20 MEQ 2 tabs twice daily. Will forward to Dr. Wyline MoodBranch to verify potassium

## 2016-06-02 NOTE — Progress Notes (Signed)
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 She was originally seen in consultation on 05/15/2016.  Since then she has undergone CT angiography, pulmonary function tests, and a formal physical therapy evaluation. Apparently the patient also has been ill with the flu. She states that she was seen by her primary care physician and given a 5 day course of antibiotics. She states that she has largely recovered but she does not feel as though she is back to 100%. She denies any ongoing symptoms of fever, chills, malaise, or productive cough. Appetite is back to baseline. The remainder of her review of systems is unchanged from previously.   Current Outpatient Prescriptions  Medication Sig Dispense Refill  . acetaminophen (TYLENOL) 650 MG CR tablet Take 650 mg by mouth every 8 (eight) hours as needed for pain.    Marland Kitchen albuterol (PROVENTIL HFA;VENTOLIN HFA) 108 (90 BASE) MCG/ACT inhaler Inhale 2 puffs into the lungs every 6 (six) hours as needed. For shortness of breath    . diltiazem (CARDIZEM) 30 MG tablet Take 1 tablet (30 mg total) by mouth 2 (two) times daily. 180 tablet 3  . Fluticasone-Salmeterol (ADVAIR) 250-50 MCG/DOSE AEPB Inhale 1 puff into the lungs 2 (two) times daily.    Marland Kitchen levothyroxine (SYNTHROID, LEVOTHROID) 50 MCG tablet Take 50 mcg by mouth daily.    Marland Kitchen loratadine (CLARITIN) 10 MG tablet Take 10 mg by mouth as needed for allergies.     . metolazone (ZAROXOLYN) 2.5 MG tablet Take 1 tab every other week;may take weekly for additional swelling as needed. 30 tablet  3  . montelukast (SINGULAIR) 10 MG tablet Take 10 mg by mouth daily.     . potassium chloride SA (K-DUR,KLOR-CON) 20 MEQ tablet Take 2 tablets (40 mEq total) by mouth 2 (two) times daily. 60 tablet 10  . rivaroxaban (XARELTO) 20 MG TABS tablet Take 1 tablet (20 mg total) by mouth daily with supper. 14 tablet 0  . rosuvastatin (CRESTOR) 10 MG tablet Take 10 mg by mouth daily.    Marland Kitchen torsemide (DEMADEX) 20 MG tablet Take 4 tablets (80 mg total) by mouth 2 (two) times daily. (Patient taking differently: Take 80 mg by mouth 2 (two) times daily. 60 MG IN THE AM...40 MG IN THE PM) 240 tablet 6   Current Facility-Administered Medications  Medication Dose Route Frequency Provider Last Rate Last Dose  . glutaraldehyde 0.625% soaking solution   Topical PRN Purcell Nails, MD          Physical Exam:   BP 123/75 (BP Location: Left Arm, Patient Position: Sitting, Cuff Size: Normal)   Pulse 80   Resp 20   Ht 5\' 1"  (1.549 m)   Wt 230 lb (104.3 kg)   SpO2 93% Comment: RA  BMI 43.46 kg/m   General:  Obese female NAD  Chest:   clear  CV:   Irregular rate and rhythm  Incisions:  n/a  Abdomen:  soft  Extremities:  Warm, + LE edema  Diagnostic Tests:  CT ANGIOGRAPHY CHEST, ABDOMEN  AND PELVIS  TECHNIQUE: Multidetector CT imaging through the chest, abdomen and pelvis was performed using the standard protocol during bolus administration of intravenous contrast. Multiplanar reconstructed images and MIPs were obtained and reviewed to evaluate the vascular anatomy.  CONTRAST:  80 mL of Isovue 370.  COMPARISON:  No priors.  FINDINGS: CTA CHEST FINDINGS  Cardiovascular: Cardiomegaly with biatrial dilatation and right ventricular dilatation. Straightening of the interventricular septum, which could suggest elevated right-sided heart pressures. There is aortic atherosclerosis, as well as atherosclerosis of the great vessels of the mediastinum and the coronary arteries, including calcified  atherosclerotic plaque in the distal left main and left anterior descending coronary arteries. No definite aortic valve calcifications. Pulmonary artery is mildly dilated measuring 3.3 cm in diameter.  Mediastinum/Nodes: No pathologically enlarged mediastinal or hilar lymph nodes. Small hiatal hernia. No axillary lymphadenopathy.  Lungs/Pleura: Assessment of the lungs is slightly limited by a large amount of respiratory motion. Small left-sided Bochdalek's hernia incidentally noted. No acute consolidative airspace disease. No pleural effusions. No definite suspicious appearing pulmonary nodules or masses.  Musculoskeletal/Soft Tissues: There are no aggressive appearing lytic or blastic lesions noted in the visualized portions of the skeleton.  CTA ABDOMEN AND PELVIS FINDINGS  Hepatobiliary: The liver has a shrunken appearance and nodular contour, indicative of early changes of cirrhosis. Diffuse low attenuation throughout the hepatic parenchyma indicative of a hepatic steatosis. No cystic or solid hepatic lesions. No intra or extrahepatic biliary ductal dilatation. Gallbladder is normal in appearance.  Pancreas: No pancreatic mass. No pancreatic ductal dilatation. No pancreatic or peripancreatic fluid or inflammatory changes.  Spleen: Unremarkable.  Adrenals/Urinary Tract: Bilateral adrenal nodules measuring 1.4 x 1.1 cm on the right and 1.7 x 1.2 cm on the left are indeterminate. Right kidney is normal in appearance. Partially exophytic 3.8 cm simple cyst in the anterior aspect of the interpolar region of the left kidney. No hydroureteronephrosis. Urinary bladder is normal in appearance.  Stomach/Bowel: Normal appearance of the stomach. Small duodenal diverticulum extending off the medial aspect of the second portion of the duodenum incidentally noted. No surrounding inflammatory changes. No pathologic dilatation of small bowel or colon. A few scattered colonic  diverticulae are noted, without surrounding inflammatory changes to suggest an acute diverticulitis at this time. Normal appendix.  Vascular/Lymphatic: Aortic atherosclerosis, without evidence of aneurysm or dissection in the abdominal or pelvic vasculature.  Reproductive: Uterus and ovaries are atrophic.  Other: No significant volume of ascites.  No pneumoperitoneum.  Musculoskeletal: There are no aggressive appearing lytic or blastic lesions noted in the visualized portions of the skeleton.  VASCULAR MEASUREMENTS:  AORTA:  Minimal Aortic Diameter -  13 x 13 mm  Severity of Aortic Calcification -  mild to moderate  RIGHT PELVIS:  Right Common Iliac Artery -  Minimal Diameter - 8.4 x 7.8 mm  Tortuosity - moderate  Calcification - mild to moderate  Right External Iliac Artery -  Minimal Diameter - 7.5 x 7.9 mm  Tortuosity - mild  Calcification - none  Right Common Femoral Artery -  Minimal Diameter - 6.8 x 6.4 mm  Tortuosity - mild  Calcification - none  LEFT PELVIS:  Left Common Iliac Artery -  Minimal Diameter - 8.7 x 7.4 mm  Tortuosity - mild  Calcification - minimal  Left External Iliac Artery -  Minimal Diameter - 7.1 x 6.7 mm  Tortuosity - mild  Calcification - none  Left Common Femoral Artery -  Minimal Diameter - 7.1 x 7.5 mm  Tortuosity - mild  Calcification - none  Review of the MIP images confirms the above findings.  IMPRESSION: 1. Cardiomegaly with biatrial dilatation and right ventricular dilatation, with straightening of the interventricular septum, suggestive of elevated right-sided heart pressures. There is also mild dilatation of the pulmonic trunk, compatible with the reported clinical history of pulmonary arterial hypertension. 2. Aortic atherosclerosis, in addition to left main and left anterior descending coronary artery disease. Assessment for potential risk factor  modification, dietary therapy or pharmacologic therapy may be warranted, if clinically indicated. 3. Hepatic steatosis with morphologic changes in the liver indicative of early cirrhosis, as above. 4. Colonic diverticulosis without findings to suggest an acute diverticulitis at this time. 5. Additional incidental findings, as above.   Electronically Signed   By: Trudie Reed M.D.   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  I have personally reviewed the patient's recent transthoracic echocardiogram and the transesophageal echocardiogram performed last year. 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 There is very mild mitral regurgitation. There is some leaflet calcification and moderate calcification of the mitral annulus. There is severe left atrial enlargement. There is normal left ventricular size and systolic function. Right ventricle is mildly dilated. There is at least moderate tricuspid regurgitation.  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 The patient's pulmonary hypertension and chronic  symptoms of congestive heart failure or further exacerbated by the presence of chronic persistent atrial fibrillation that has failed DC cardioversion and morbid obesity. Options include continued medical therapy versus mitral valve replacement, tricuspid valve repair, and possible Maze procedure. Risks associated with surgery may be fairly high in this patient because of her advanced age, numerous comorbid medical problems, and significant physical deconditioning.  Because of the patient's morbid obesity and severe pulmonary hypertension, I feel that the patient would not be a relatively good candidate for minimally invasive approach and I favor a conventional median sternotomy for surgery .    Plan:  I have again reviewed the indications, risks, and potential benefits of mitral valve replacement, tricuspid valve repair, and Maze procedure with the patient and her daughter in the office this afternoon. Alternative treatment strategies have been discussed. The patient is interested in proceeding with surgery in the near future, but she feels as though she needs at least another couple of weeks to fully recover from her recent bout with the flu.  We tentatively plan to proceed with surgery on Thursday, 06/23/2016. The patient will return to our office for follow-up on Monday, 06/22/2016. The patient has been instructed to stop taking Xarelto on Friday, 06/19/2016 in anticipation of surgery. During the interim period of time the patient  will call if symptoms related to her recent illness have not completely resolved. All of her questions have been addressed.   I spent in excess of 30 minutes during the conduct of this office consultation and >50% of this time involved direct face-to-face encounter with the patient for counseling and/or coordination of their care.   Salvatore Decentlarence H. Cornelius Moraswen, MD 06/02/2016 5:27 PM

## 2016-06-18 ENCOUNTER — Telehealth: Payer: Self-pay | Admitting: Cardiology

## 2016-06-18 NOTE — Progress Notes (Signed)
This encounter was created in error - please disregard.

## 2016-06-18 NOTE — Telephone Encounter (Signed)
Patients sister called to tell us that the patients legs are red/swollen and feels like a fever is in them.   Stated that fluid pills are not working

## 2016-06-18 NOTE — Telephone Encounter (Signed)
Pt says for last week weight has stayed at 240 lbs with legs red/swollen - pt normally takes 80 mg of torsemide bid today she took an extra 20 mg for a total of 180 mg of torsemide today. Weight has gone down to 236lbs and pt says the extra torsemide has helped with leg swelling. Says she is feeling better - wants to know if ok to take the extra 20 mg tab of torsemide when she swells and weight gain. She denies CP/SOB/dizziness. Will forward to Dr. Wyline MoodBranch

## 2016-06-18 NOTE — Telephone Encounter (Signed)
Ok to take extra torsemide 20mg  as needed. If her legs are red and inflamed she needs to have her pcp take a look at them to see if there is an infection   [place] Ferry[place] Amiel Sharrow MD

## 2016-06-19 NOTE — Pre-Procedure Instructions (Signed)
Jeanne Diefendorf  06/19/2016      [id number] - [place], [place] - [address] [place] [address] Phone: [phone] Fax: [phone]    Your procedure is scheduled on September 28  Report to [facility] Admitting at 0530 A.M.  Call this number if you have problems the morning of surgery:  [phone]   Remember:  Do not eat food or drink liquids after midnight.   Take these medicines the morning of surgery with A SIP OF WATER acetaminophen (tylenol), albuterol if needed, diltiazem (cardizem), advair, levothyroxine (synthroid), loratadine (clarintin), montelukast (singular),   7 days prior to surgery STOP taking any Aspirin, Aleve, Naproxen, Ibuprofen, Motrin, Advil, Goody's, BC's, all herbal medications, fish oil, and all vitamins   Stop taking Xarelto on     Do not wear jewelry, make-up or nail polish.  Do not wear lotions, powders, or perfumes, or deoderant.  Do not shave 48 hours prior to surgery.  Men may shave face and neck.  Do not bring valuables to the hospital.  [facility] is not responsible for any belongings or valuables.  Contacts, dentures or bridgework may not be worn into surgery.  Leave your suitcase in the car.  After surgery it may be brought to your room.  For patients admitted to the hospital, discharge time will be determined by your treatment team.  Patients discharged the day of surgery will not be allowed to drive home.    Special instructions:   [facility]- Preparing For Surgery  Before surgery, you can play an important role. Because skin is not sterile, your skin needs to be as free of germs as possible. You can reduce the number of germs on your skin by washing with CHG (chlorahexidine gluconate) Soap before surgery.  CHG is an antiseptic cleaner which kills germs and bonds with the skin to continue killing germs even after washing.  Please do not use if you have an allergy to CHG or  antibacterial soaps. If your skin becomes reddened/irritated stop using the CHG.  Do not shave (including legs and underarms) for at least 48 hours prior to first CHG shower. It is OK to shave your face.  Please follow these instructions carefully.   1. Shower the NIGHT BEFORE SURGERY and the MORNING OF SURGERY with CHG.   2. If you chose to wash your hair, wash your hair first as usual with your normal shampoo.  3. After you shampoo, rinse your hair and body thoroughly to remove the shampoo.  4. Use CHG as you would any other liquid soap. You can apply CHG directly to the skin and wash gently with a scrungie or a clean washcloth.   5. Apply the CHG Soap to your body ONLY FROM THE NECK DOWN.  Do not use on open wounds or open sores. Avoid contact with your eyes, ears, mouth and genitals (private parts). Wash genitals (private parts) with your normal soap.  6. Wash thoroughly, paying special attention to the area where your surgery will be performed.  7. Thoroughly rinse your body with warm water from the neck down.  8. DO NOT shower/wash with your normal soap after using and rinsing off the CHG Soap.  9. Pat yourself dry with a CLEAN TOWEL.   10. Wear CLEAN PAJAMAS   11. Place CLEAN SHEETS on your bed the night of your first shower and DO NOT SLEEP WITH PETS.    Day of Surgery: Do  not apply any deodorants/lotions. Please wear clean clothes to the hospital/surgery center.      Please read over the following fact sheets that you were given. Coughing and Deep Breathing, Blood Transfusion Information, MRSA Information and Surgical Site Infection Prevention, incentive spirometry

## 2016-06-19 NOTE — Telephone Encounter (Signed)
Pt aware, weight 235lbs today - says she has f/u appt with pcp in the next few weeks and procedure 9/28 Dr. Cornelius Moraswen

## 2016-06-22 ENCOUNTER — Encounter (HOSPITAL_COMMUNITY): Payer: Self-pay

## 2016-06-22 ENCOUNTER — Ambulatory Visit (HOSPITAL_COMMUNITY)
Admission: RE | Admit: 2016-06-22 | Discharge: 2016-06-22 | Disposition: A | Discharge status: Home / Self Care | Payer: Medicare Other | Source: Ambulatory Visit | Attending: Thoracic Surgery (Cardiothoracic Vascular Surgery) | Admitting: Thoracic Surgery (Cardiothoracic Vascular Surgery)

## 2016-06-22 ENCOUNTER — Other Ambulatory Visit: Payer: Self-pay

## 2016-06-22 ENCOUNTER — Encounter: Payer: Self-pay | Admitting: Thoracic Surgery (Cardiothoracic Vascular Surgery)

## 2016-06-22 ENCOUNTER — Encounter (HOSPITAL_COMMUNITY)
Admission: RE | Admit: 2016-06-22 | Discharge: 2016-06-22 | Disposition: A | Discharge status: Home / Self Care | Payer: Medicare Other | Source: Ambulatory Visit | Attending: Thoracic Surgery (Cardiothoracic Vascular Surgery) | Admitting: Thoracic Surgery (Cardiothoracic Vascular Surgery)

## 2016-06-22 ENCOUNTER — Ambulatory Visit (INDEPENDENT_AMBULATORY_CARE_PROVIDER_SITE_OTHER): Payer: Medicare Other | Admitting: Thoracic Surgery (Cardiothoracic Vascular Surgery)

## 2016-06-22 ENCOUNTER — Ambulatory Visit (HOSPITAL_COMMUNITY): Admission: RE | Admit: 2016-06-22 | Payer: Medicare Other | Source: Ambulatory Visit

## 2016-06-22 ENCOUNTER — Ambulatory Visit (HOSPITAL_BASED_OUTPATIENT_CLINIC_OR_DEPARTMENT_OTHER)
Admission: RE | Admit: 2016-06-22 | Discharge: 2016-06-22 | Disposition: A | Discharge status: Home / Self Care | Payer: Medicare Other | Source: Ambulatory Visit | Attending: Thoracic Surgery (Cardiothoracic Vascular Surgery) | Admitting: Thoracic Surgery (Cardiothoracic Vascular Surgery)

## 2016-06-22 VITALS — BP 109/65 | HR 72 | Resp 20 | Ht 61.0 in | Wt 230.0 lb

## 2016-06-22 DIAGNOSIS — Z01812 Encounter for preprocedural laboratory examination: Secondary | ICD-10-CM

## 2016-06-22 DIAGNOSIS — I482 Chronic atrial fibrillation, unspecified: Secondary | ICD-10-CM

## 2016-06-22 DIAGNOSIS — I071 Rheumatic tricuspid insufficiency: Secondary | ICD-10-CM

## 2016-06-22 DIAGNOSIS — I059 Rheumatic mitral valve disease, unspecified: Secondary | ICD-10-CM

## 2016-06-22 DIAGNOSIS — I050 Rheumatic mitral stenosis: Secondary | ICD-10-CM

## 2016-06-22 DIAGNOSIS — I4891 Unspecified atrial fibrillation: Secondary | ICD-10-CM

## 2016-06-22 DIAGNOSIS — I5032 Chronic diastolic (congestive) heart failure: Secondary | ICD-10-CM

## 2016-06-22 DIAGNOSIS — Z01818 Encounter for other preprocedural examination: Secondary | ICD-10-CM | POA: Insufficient documentation

## 2016-06-22 DIAGNOSIS — I083 Combined rheumatic disorders of mitral, aortic and tricuspid valves: Secondary | ICD-10-CM | POA: Diagnosis not present

## 2016-06-22 DIAGNOSIS — I099 Rheumatic heart disease, unspecified: Secondary | ICD-10-CM

## 2016-06-22 HISTORY — DX: Unspecified macular degeneration: H35.30

## 2016-06-22 LAB — COMPREHENSIVE METABOLIC PANEL
ALBUMIN: 3.3 g/dL — AB (ref 3.5–5.0)
ALK PHOS: 95 U/L (ref 38–126)
ALT: 12 U/L — AB (ref 14–54)
AST: 23 U/L (ref 15–41)
Anion gap: 16 — ABNORMAL HIGH (ref 5–15)
BILIRUBIN TOTAL: 1.3 mg/dL — AB (ref 0.3–1.2)
BUN: 27 mg/dL — AB (ref 6–20)
CALCIUM: 9.2 mg/dL (ref 8.9–10.3)
CO2: 29 mmol/L (ref 22–32)
CREATININE: 1.38 mg/dL — AB (ref 0.44–1.00)
Chloride: 91 mmol/L — ABNORMAL LOW (ref 101–111)
GFR calc Af Amer: 42 mL/min — ABNORMAL LOW (ref >60)
GFR calc non Af Amer: 36 mL/min — ABNORMAL LOW (ref >60)
GLUCOSE: 118 mg/dL — AB (ref 65–99)
Potassium: 2.8 mmol/L — ABNORMAL LOW (ref 3.5–5.1)
Sodium: 136 mmol/L (ref 135–145)
TOTAL PROTEIN: 7 g/dL (ref 6.5–8.1)

## 2016-06-22 LAB — URINALYSIS, ROUTINE W REFLEX MICROSCOPIC
BILIRUBIN URINE: NEGATIVE
Glucose, UA: NEGATIVE mg/dL
Hgb urine dipstick: NEGATIVE
Ketones, ur: NEGATIVE mg/dL
Leukocytes, UA: NEGATIVE
NITRITE: NEGATIVE
Protein, ur: NEGATIVE mg/dL
SPECIFIC GRAVITY, URINE: 1.011 (ref 1.005–1.030)
pH: 6.5 (ref 5.0–8.0)

## 2016-06-22 LAB — PROTIME-INR
INR: 1.06
Prothrombin Time: 13.8 seconds (ref 11.4–15.2)

## 2016-06-22 LAB — BLOOD GAS, ARTERIAL
Acid-Base Excess: 11.1 mmol/L — ABNORMAL HIGH (ref 0.0–2.0)
Bicarbonate: 35.2 mmol/L — ABNORMAL HIGH (ref 20.0–28.0)
DRAWN BY: 206361
FIO2: 0.21
O2 Saturation: 92.5 %
PH ART: 7.496 — AB (ref 7.350–7.450)
Patient temperature: 98.6
pCO2 arterial: 45.9 mmHg (ref 32.0–48.0)
pO2, Arterial: 64.4 mmHg — ABNORMAL LOW (ref 83.0–108.0)

## 2016-06-22 LAB — CBC
HEMATOCRIT: 37.2 % (ref 36.0–46.0)
HEMOGLOBIN: 12.4 g/dL (ref 12.0–15.0)
MCH: 32 pg (ref 26.0–34.0)
MCHC: 33.3 g/dL (ref 30.0–36.0)
MCV: 95.9 fL (ref 78.0–100.0)
Platelets: 216 10*3/uL (ref 150–400)
RBC: 3.88 MIL/uL (ref 3.87–5.11)
RDW: 14.4 % (ref 11.5–15.5)
WBC: 6.9 10*3/uL (ref 4.0–10.5)

## 2016-06-22 LAB — SURGICAL PCR SCREEN
MRSA, PCR: NEGATIVE
STAPHYLOCOCCUS AUREUS: NEGATIVE

## 2016-06-22 LAB — APTT: aPTT: 39 seconds — ABNORMAL HIGH (ref 24–36)

## 2016-06-22 LAB — ABO/RH: ABO/RH(D): A POS

## 2016-06-22 NOTE — Progress Notes (Signed)
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 She was originally seen in consultation on 05/15/2016 and she was last seen here in our office on 06/02/2016. At that time she was complaining of an upper respiratory tract infection and had just previously been given a prescription for antibiotics by her primary care physician. Since then all of her symptoms have resolved. She states that she feels quite well. Her breathing is back to baseline. She has no fevers or chills. She has no cough. Her sinus congestion has resolved. Appetite is good. She is looking forward to proceeding with surgery later this week as originally scheduled. She stopped taking Xarelto in anticipation of surgery 3 days ago. After discussion with the patient and her daughter they elected to not proceed with bridging therapy using subcutaneous Lovenox injections.  Current Outpatient Prescriptions  Medication Sig Dispense Refill  . acetaminophen (TYLENOL) 650 MG CR tablet Take 650 mg by mouth every 8 (eight) hours as needed for pain.    Marland Kitchen. albuterol (PROVENTIL HFA;VENTOLIN HFA) 108 (90 BASE) MCG/ACT inhaler Inhale 2 puffs into the lungs every 6 (six) hours as needed. For shortness of breath    . diltiazem (CARDIZEM) 30 MG tablet Take 1 tablet (30 mg total) by mouth 2 (two) times daily. 180 tablet 3  . Fluticasone-Salmeterol (ADVAIR) 250-50 MCG/DOSE AEPB Inhale 1 puff into the lungs 2 (two) times daily.    Marland Kitchen.  levothyroxine (SYNTHROID, LEVOTHROID) 50 MCG tablet Take 50 mcg by mouth daily.    Marland Kitchen. loratadine (CLARITIN) 10 MG tablet Take 10 mg by mouth as needed for allergies.     . metolazone (ZAROXOLYN) 2.5 MG tablet Take 1 tab every other week;may take weekly for additional swelling as needed. 30 tablet 3  . montelukast (SINGULAIR) 10 MG tablet Take 10 mg by mouth daily.     . potassium chloride SA (K-DUR,KLOR-CON) 20 MEQ tablet Take 2 tablets (40 mEq total) by mouth 2 (two) times daily. 60 tablet 10  . rosuvastatin (CRESTOR) 10 MG tablet Take 10 mg by mouth daily.    Marland Kitchen. torsemide (DEMADEX) 20 MG tablet Take 4 tablets (80 mg total) by mouth 2 (two) times daily. (Patient taking differently: Take 80 mg by mouth 2 (two) times daily. 60 MG IN THE AM...40 MG IN THE PM) 240 tablet 6  . rivaroxaban (XARELTO) 20 MG TABS tablet Take 1 tablet (20 mg total) by mouth daily with supper. (Patient not taking: Reported on 06/22/2016) 14 tablet 0   Current Facility-Administered Medications  Medication Dose Route Frequency Provider Last Rate Last Dose  . 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 I have personally reviewed  the patient's recent transthoracic echocardiogram and the transesophageal echocardiogram performed last year. 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 There is very mild mitral regurgitation. There is some leaflet calcification and moderate calcification of the mitral annulus. There is severe left atrial enlargement. There is normal left ventricular size and systolic function. Right ventricle is mildly dilated. There is at least moderate tricuspid regurgitation. 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 The patient's pulmonary hypertension and chronic symptoms of congestive heart failure or further exacerbated by the presence of chronic persistent atrial fibrillation that has failed DC cardioversion and morbid obesity. Options include continued medical therapy versus mitral valve replacement, tricuspid valve repair, and possible Maze procedure. Risks associated with surgery may be fairly high in this patient because of her advanced age, numerous comorbid medical problems, and significant physical deconditioning.  Because of the patient's morbid obesity and severe pulmonary hypertension, I feel that the patient would not be a relatively good candidate for minimally invasive approach and I favor a conventional median sternotomy for surgery .   Plan:  I have again reviewed the indications, risks, and potential benefits of mitral valve replacement, tricuspid valve repair, and Maze procedure with the patient and her daughter in the office this afternoon. Alternative treatment strategies have been discussed.   Expectations for the patient's postoperative convalescence of been discussed. In particular, the patient understands that even in the event of a completely uncomplicated  postoperative course she will need short-term placement in some type of skilled nursing facility for physical rehabilitation.  They understand and accept all potential risks of surgery including but not limited to risk of death, stroke or other neurologic complication, myocardial infarction, congestive heart failure, respiratory failure, renal failure, bleeding requiring transfusion and/or reexploration, arrhythmia, infection or other wound complications, pneumonia, pleural and/or pericardial effusion, pulmonary embolus, aortic dissection or other major vascular complication, or delayed complications related to valve repair or replacement including but not limited to structural valve deterioration and failure, thrombosis, embolization, endocarditis, or paravalvular leak.  The relative risks and benefits of performing a maze procedure at the time of their surgery was discussed at length, including the expected likelihood of long term freedom from recurrent symptomatic atrial fibrillation and/or atrial flutter.  All of their questions have been answered.   I spent in excess of 15 minutes during the conduct of this office consultation and >50% of this time involved direct face-to-face encounter with the patient for counseling and/or coordination of their care.    Salvatore Decent. Cornelius Moras, MD 06/22/2016 11:40 AM

## 2016-06-22 NOTE — Patient Instructions (Addendum)
Patient has already stopped taking Xarelto  Patient should continue taking all other medications without change through the day before surgery.  Patient should have nothing to eat or drink after midnight the night before surgery.  On the morning of surgery patient should take only Synthroid and Cardizem with a sip of water.

## 2016-06-22 NOTE — Pre-Procedure Instructions (Signed)
Ann Potter  06/22/2016      [id number] - [place], [place] - [address] [place] [address] Phone: [phone] Fax: [phone]    Your procedure is scheduled on September 28  Report to [facility] Admitting at 0530 A.M.  Call this number if you have problems the morning of surgery:  [phone]   Remember:  Do not eat food or drink liquids after midnight.   Take these medicines the morning of surgery with A SIP OF WATER acetaminophen (tylenol), albuterol if needed, diltiazem (cardizem), advair, levothyroxine (synthroid), loratadine (claritin), montelukast (singular),   7 days prior to surgery STOP taking any Aspirin, Aleve, Naproxen, Ibuprofen, Motrin, Advil, Goody's, BC's, all herbal medications, fish oil, and all vitamins    Do not wear jewelry, make-up or nail polish.  Do not wear lotions, powders, or perfumes, or deoderant.  Do not shave 48 hours prior to surgery.    Do not bring valuables to the hospital.  [facility] is not responsible for any belongings or valuables.  Contacts, dentures or bridgework may not be worn into surgery.  Leave your suitcase in the car.  After surgery it may be brought to your room.  For patients admitted to the hospital, discharge time will be determined by your treatment team.  Patients discharged the day of surgery will not be allowed to drive home.   Special instructions:   [facility]- Preparing For Surgery  Before surgery, you can play an important role. Because skin is not sterile, your skin needs to be as free of germs as possible. You can reduce the number of germs on your skin by washing with CHG (chlorahexidine gluconate) Soap before surgery.  CHG is an antiseptic cleaner which kills germs and bonds with the skin to continue killing germs even after washing.  Please do not use if you have an allergy to CHG or antibacterial soaps. If your skin becomes reddened/irritated  stop using the CHG.  Do not shave (including legs and underarms) for at least 48 hours prior to first CHG shower. It is OK to shave your face.  Please follow these instructions carefully.   1. Shower the NIGHT BEFORE SURGERY and the MORNING OF SURGERY with CHG.   2. If you chose to wash your hair, wash your hair first as usual with your normal shampoo.  3. After you shampoo, rinse your hair and body thoroughly to remove the shampoo.  4. Use CHG as you would any other liquid soap. You can apply CHG directly to the skin and wash gently with a scrungie or a clean washcloth.   5. Apply the CHG Soap to your body ONLY FROM THE NECK DOWN.  Do not use on open wounds or open sores. Avoid contact with your eyes, ears, mouth and genitals (private parts). Wash genitals (private parts) with your normal soap.  6. Wash thoroughly, paying special attention to the area where your surgery will be performed.  7. Thoroughly rinse your body with warm water from the neck down.  8. DO NOT shower/wash with your normal soap after using and rinsing off the CHG Soap.  9. Pat yourself dry with a CLEAN TOWEL.   10. Wear CLEAN PAJAMAS   11. Place CLEAN SHEETS on your bed the night of your first shower and DO NOT SLEEP WITH PETS.    Day of Surgery: Do not apply any deodorants/lotions. Please wear clean clothes to the hospital/surgery center.  Please read over the following fact sheets that you were given. Pain Booklet, Coughing and Deep Breathing, Blood Transfusion Information, MRSA Information and Surgical Site Infection Prevention, incentive spirometry

## 2016-06-22 NOTE — Progress Notes (Signed)
Dr. Randa EvensEdwards made aware that pt Potassium was 2.8, MD advised that I notify surgeon. Spoke with Dr. Donata ClayVan Trigt (on call) to make aware that pt potassium was 2.8; no new orders given.

## 2016-06-22 NOTE — Progress Notes (Signed)
Abnormal CMP called Ryan left message about labs asked her to call me back

## 2016-06-22 NOTE — H&P (Signed)
[address]       [name] [address]             [phone]          CARDIOTHORACIC SURGERY HISTORY AND PHYSICAL EXAM  Referring Provider is Branch, Dorothe Pea, MD PCP is Josue Hector, MD      Chief Complaint  Patient presents with  . Mitral Stenosis    ECHO 03/19/16, CATH 04/16/16  . Tricuspid Regurgitation    HPI:  Patient is a 74 year old morbidly obese female with history of rheumatic fever during childhood, long-standing rheumatic heart disease with mitral stenosis, chronic persistent atrial fibrillation on long-term anticoagulation, chronic diastolic congestive heart failure, hypertension, pulmonary hypertension, tricuspid regurgitation, stage III chronic kidney disease, and venous insufficiency with severe chronic lower extremity edema who has been referred for surgical consultation to discuss treatment options for management of rheumatic mitral stenosis and tricuspid regurgitation. The patient states that she had rheumatic fever when she was a child and was ill for several months. She has been diagnosed with a heart murmur most of her life. She has been diagnosed with rheumatic mitral stenosis and chronic persistent atrial fibrillation for many years. She was followed for a while by [name] but then lost to follow-up. She moved to [place] to live with her son for a while but was not seen by a cardiologist. More recently she returned to this area to be near her daughter for the last couple of years she has been followed by Dr. Wyline Mood.  The patient states that she has been doing better over the past year or so since Dr. Wyline Mood began to manage her long-term medical therapy. However, management of the patient's chronic diastolic congestive heart failure has been challenging.  Previous transesophageal echocardiogram performed 06/26/2015 revealed rheumatic mitral valve disease with at least moderate mitral stenosis. Mean transvalvular gradient was  estimated 8 mmHg corresponding to mitral valve area estimated 1.3 cm by pressure half-time.  Follow-up transthoracic echocardiogram performed 03/19/2016 revealed normal left ventricular size and systolic function. There was rheumatic mitral stenosis with severe thickening and restricted leaflet mobility involving both leaflets of the mitral valve. There was mild calcification of the leaflets with significant annular calcification. There was moderate to severe mitral stenosis with mean transvalvular gradient estimated 9 mmHg corresponding to valve area estimated 1.02 cm.  The patient subsequently underwent left and right heart catheterization by Dr. Excell Seltzer on 04/16/2016. She was found to have mild nonobstructive coronary artery disease. There was moderate pulmonary hypertension with PA pressures measured 62/30 and mean pulmonary artery pressure 45 mmHg. Central venous pressure was 12.  There was at least moderate mitral stenosis with peak to peak gradient 12 mmHg, mean transvalvular gradient 14.8 mmHg, and mitral valve area calculated 1.39 cm.  The patient was referred for surgical consultation.  The patient is single and lives alone in a assisted living facility in [place], [place].  Her daughter lives nearby and is supportive. The patient lives a sedentary lifestyle and has not been very active physically for more than 10 years. Her physical mobility and activity are limited by exertional shortness of breath, chronic pain and swelling in both legs and feet, and poor eyesight. She walks using a walker when she has to travel any significant distance. She gets short of breath with moderate activity. She cannot lay flat in bed. She is comfortable breathing at rest. She has never had any chest pain or chest tightness with activity. She  has chronic lower extremity edema. She has had dizzy spells without syncope. She has palpitations and known history of atrial fibrillation. She is very hard of hearing and has poor  eyesight.  The patient has been anticoagulated using warfarin in the past but is currently anticoagulated using Xarelto because of difficulty maintaining therapeutic anticoagulation on warfarin.     Past Medical History:  Diagnosis Date  . Anxiety   . Arthritis    "feet & legs" (04/16/2016)  . Asthma   . Atrial fibrillation (HCC)   . Atrial fibrillation, chronic (HCC) 01/25/2012   Status post DCCV, June 2013  . CAD (coronary artery disease)    Nonobstructive, by catheterization 4/13  . CHF (congestive heart failure) (HCC)   . Chronic diastolic congestive heart failure (HCC) 01/25/2012  . Chronic lower back pain   . Chronic renal insufficiency, stage III (moderate) 01/25/2012  . Chronic venous insufficiency   . COPD (chronic obstructive pulmonary disease) (HCC)   . Heart murmur   . HLD (hyperlipidemia)   . HTN (hypertension)   . Hypothyroidism   . Lower extremity edema   . Mitral stenosis    Mild, by catheterization 4/13  . Morbid obesity (HCC)   . Myocardial infarction (HCC) 2007  . Pulmonary hypertension (HCC)    Moderate, by catheterization 4/13  . Rheumatic fever during childhood    Age [age]  . Rheumatic heart disease   . Tricuspid regurgitation   . Worries     Past Surgical History:  Procedure Laterality Date  . CARDIAC CATHETERIZATION  12/2011; 04/16/2016  . CARDIAC CATHETERIZATION N/A 04/16/2016   Procedure: Right/Left Heart Cath and Coronary Angiography;  Surgeon: Tonny Bollman, MD;  Location: [facility] INVASIVE CV LAB;  Service: Cardiovascular;  Laterality: N/A;  . CATARACT EXTRACTION W/ INTRAOCULAR LENS  IMPLANT, BILATERAL Bilateral   . MULTIPLE TOOTH EXTRACTIONS  1965  . TEE WITH CARDIOVERSION     3 year ago  . TEE WITHOUT CARDIOVERSION N/A 06/25/2015   Procedure: TRANSESOPHAGEAL ECHOCARDIOGRAM (TEE);  Surgeon: Antoine Poche, MD;  Location: [facility] SUITE;  Service: Cardiology;  Laterality: N/A;  moved to 8:30 - Terri calling pt & Bernie  . TUBAL LIGATION  1984     Family History  Problem Relation Age of Onset  . Heart disease Father 60  . Deep vein thrombosis Father   . Cancer Mother     right breast   . Cancer Sister     brain cancer  . Stomach cancer Brother     Social History Social History  Substance Use Topics  . Smoking status: Former Smoker    Packs/day: 1.00    Years: 11.00    Types: Cigarettes    Start date: 03/08/1960  . Smokeless tobacco: Never Used     Comment: "quit smoking in ~  1990"  . Alcohol use No    Prior to Admission medications   Medication Sig Start Date End Date Taking? Authorizing Provider  acetaminophen (TYLENOL) 650 MG CR tablet Take 650 mg by mouth every 8 (eight) hours as needed for pain.    Historical Provider, MD  albuterol (PROVENTIL HFA;VENTOLIN HFA) 108 (90 BASE) MCG/ACT inhaler Inhale 2 puffs into the lungs every 6 (six) hours as needed. For shortness of breath    Historical Provider, MD  diltiazem (CARDIZEM) 30 MG tablet Take 1 tablet (30 mg total) by mouth 2 (two) times daily. 07/03/15   Antoine Poche, MD  Fluticasone-Salmeterol (ADVAIR) 250-50 MCG/DOSE AEPB Inhale 1 puff  into the lungs 2 (two) times daily.    Historical Provider, MD  levothyroxine (SYNTHROID, LEVOTHROID) 50 MCG tablet Take 50 mcg by mouth daily.    Historical Provider, MD  loratadine (CLARITIN) 10 MG tablet Take 10 mg by mouth as needed for allergies.     Historical Provider, MD  metolazone (ZAROXOLYN) 2.5 MG tablet Take 1 tab every other week;may take weekly for additional swelling as needed. 05/21/16   Antoine Poche, MD  montelukast (SINGULAIR) 10 MG tablet Take 10 mg by mouth daily.     Historical Provider, MD  potassium chloride SA (K-DUR,KLOR-CON) 20 MEQ tablet Take 2 tablets (40 mEq total) by mouth 2 (two) times daily. 04/17/16   Arty Baumgartner, NP  rivaroxaban (XARELTO) 20 MG TABS tablet Take 1 tablet (20 mg total) by mouth daily with supper. Patient not taking: Reported on 06/22/2016 10/01/15   Antoine Poche, MD   rosuvastatin (CRESTOR) 10 MG tablet Take 10 mg by mouth daily.    Historical Provider, MD  torsemide (DEMADEX) 20 MG tablet Take 4 tablets (80 mg total) by mouth 2 (two) times daily. Patient taking differently: Take 80 mg by mouth 2 (two) times daily. 60 MG IN THE AM...40 MG IN THE PM 04/08/16   Antoine Poche, MD    Allergies  Allergen Reactions  . Relafen  [Nabumetone] Other (See Comments)    Bladder pain     Review of Systems:              General:                      normal appetite, decreased energy, + weight gain, + weight loss, no fever             Cardiac:                       no chest pain with exertion, no chest pain at rest, +SOB with exertion, no resting SOB, no PND, + orthopnea, + palpitations, + arrhythmia, + atrial fibrillation, + LE edema, + dizzy spells, no syncope             Respiratory:                 + shortness of breath, no home oxygen, + chronic productive cough, no dry cough, no bronchitis, + wheezing, no hemoptysis, no asthma, no pain with inspiration or cough, + sleep apnea, no CPAP at night             GI:                               no difficulty swallowing, no reflux, no frequent heartburn, no hiatal hernia, no abdominal pain, no constipation, no diarrhea, no hematochezia, no hematemesis, no melena             GU:                              no dysuria,  + frequency, + recurrent urinary tract infection, no hematuria, no kidney stones, + kidney disease             Vascular:                     + pain suggestive of claudication, + pain in feet, no  leg cramps, + varicose veins, no DVT, no non-healing foot ulcer             Neuro:                         no stroke, no TIA's, no seizures, no headaches, no temporary blindness one eye,  no slurred speech, no peripheral neuropathy, + chronic pain, + instability of gait, + mild memory/cognitive dysfunction             Musculoskeletal:         + arthritis, no joint swelling, no myalgias, + difficulty walking,  limited mobility              Skin:                            no rash, + itching, no skin infections, no pressure sores or ulcerations             Psych:                         no anxiety, + depression, no nervousness, no unusual recent stress             Eyes:                           no blurry vision, no floaters, + recent vision changes             ENT:                            + severe hearing loss, edentulous w/ full set dentures             Hematologic:               + easy bruising, no abnormal bleeding, no clotting disorder, no frequent epistaxis             Endocrine:                   no diabetes, does not check CBG's at home                           Physical Exam:              BP 134/70   Pulse 87   Resp 16   Ht 5\' 1"  (1.549 m)   Wt 230 lb (104.3 kg)   BMI 43.46 kg/m              General:                      Obese elderly female NAD              HEENT:                       Unremarkable              Neck:                           no JVD, no bruits, no adenopathy              Chest:  clear to auscultation, symmetrical breath sounds, no wheezes, no rhonchi              CV:                              Irregular rate and rhythm, no murmur              Abdomen:                    soft, non-tender, no masses              Extremities:                 warm, adequately-perfused, pulses not palpable , + severe bilateral LE edema with chronic skin changes c/w moderate-severe venous insufficiency             Rectal/GU                   Deferred             Neuro:                         Grossly non-focal and symmetrical throughout             Skin:                            Clean and dry, no rashes, no breakdown   Diagnostic Tests:  Transesophageal Echocardiography  Patient: Ann Potter, Ann Potter MR #: [id number] Study Date: 06/25/2015 Gender: F Age: 63 Height: 152.4 cm Weight: 113.9 kg BSA: 2.27 m^2 Pt.  Status: Room:  ADMITTING Patrick Jupiter, M.D. ATTENDING Patrick Jupiter, M.D. Lisette Abu, M.D. REFERRING Patrick Jupiter, M.D. PERFORMING [facility], [facility] Hawking SONOGRAPHER Delbert Phenix, RCS  cc:  ------------------------------------------------------------------- LV EF: 55% - 60%  ------------------------------------------------------------------- Indications: Mitral stenosis [non-rheumatic] 424.0.  ------------------------------------------------------------------- Study Conclusions  - Left ventricle: The cavity size was normal. Wall thickness was normal. Systolic function was normal. The estimated ejection fraction was in the range of 55% to 60%. - Aortic valve: There was mild regurgitation. The AI vena contracta is 0.2 cm. - Mitral valve: The findings are consistent with moderate stenosis. There was mild regurgitation. Mean gradient (D): 8 mm Hg. Valve area by pressure half-time: 1.3 cm^2. - Left atrium: The atrium was severely dilated. No evidence of thrombus in the atrial cavity or appendage. - Right ventricle: The cavity size was moderately dilated. - Right atrium: The atrium was severely dilated. The interatrial septum bowes to the left at times consistent with elevated RA pressures. - Atrial septum: No defect or patent foramen ovale was identified. - Tricuspid valve: There was severe regurgitation. The TR vena contracta is 0.7 cm. The TR is eccentric and poteriorally directed. - Pulmonary arteries: Systolic pressure was severely increased. PASP is at least 83 mmHg. IVC not visualized during this test, cannot estimate RA pressure. - Technically adequate study.  Diagnostic transesophageal echocardiography. 2D and color Doppler. Birthdate: Patient birthdate: [birth year]. Age: Patient is 75 yr old. Sex: Gender: female. BMI: 49 kg/m^2. Blood pressure: 141/78 Study date: Study date:  06/25/2015. Study time: 08:23 AM. Location: Endoscopy.  -------------------------------------------------------------------  ------------------------------------------------------------------- Left ventricle: The cavity size was normal. Wall thickness was normal. Systolic function was normal. The estimated ejection fraction was in the range of 55% to 60%.  ------------------------------------------------------------------- Aortic valve: Trileaflet; normal  thickness leaflets. Doppler: There was no stenosis. There was mild regurgitation. The AI vena contracta is 0.2 cm.  ------------------------------------------------------------------- Aorta: The visualized portions of the descending thoracic aorta show mild plaque. Aortic root: The aortic root was normal in size.  ------------------------------------------------------------------- Mitral valve: Doppler: The findings are consistent with moderate stenosis. There was mild regurgitation. Valve area by pressure half-time: 1.3 cm^2. Mean gradient (D): 8 mm Hg. Peak gradient (D): 19 mm Hg.  ------------------------------------------------------------------- Left atrium: The atrium was severely dilated. No evidence of thrombus in the atrial cavity or appendage. There is very mild spontaneous contrast in the LA appendage.  ------------------------------------------------------------------- Atrial septum: No defect or patent foramen ovale was identified.  ------------------------------------------------------------------- Right ventricle: The cavity size was moderately dilated. Systolic function was normal.  ------------------------------------------------------------------- Pulmonic valve: Not well visualized. Doppler: There was no evidence for stenosis. There was mild regurgitation.  ------------------------------------------------------------------- Tricuspid valve: Normal thickness  leaflets. Doppler: There was no evidence for stenosis. There was severe regurgitation. The TR vena contracta is 0.7 cm. The TR is eccentric and poteriorally directed.  ------------------------------------------------------------------- Pulmonary artery: Systolic pressure was severely increased. PASP is at least 83 mmHg. IVC not visualized during this test, cannot estimate RA pressure.  ------------------------------------------------------------------- Right atrium: The atrium was severely dilated. The interatrial septum bowes to the left at times consistent with elevated RA pressures.  ------------------------------------------------------------------- Pericardium: There was no pericardial effusion.  ------------------------------------------------------------------- Post procedure conclusions Ascending Aorta:  - The visualized portions of the descending thoracic aorta show mild plaque.  ------------------------------------------------------------------- Measurements  Aorta Value Aortic arch ID 35 mm  Mitral valve Value Mitral E-wave peak velocity 219 cm/s Mitral mean velocity, D 132.82 cm/s Mitral mean gradient, D 8 mm Hg Mitral peak gradient, D 19 mm Hg Mitral valve area, PHT, DP 1.3 cm^2  Legend: (L) and (H) mark values outside specified reference range.  ------------------------------------------------------------------- Prepared and Electronically Authenticated by  Patrick Jupiter, M.D. 2016-09-28T11:14:28    Transthoracic Echocardiography  Patient: Anique, Beckley MR #: [id number] Study Date: 03/19/2016 Gender: F Age: 16 Height: 152.4 cm Weight: 106.6 kg BSA: 2.19 m^2 Pt. Status: Room:  SONOGRAPHER Missy Church, RVT, RDCS ATTENDING Patrick Jupiter,  M.D. Lisette Abu, M.D. REFERRING Patrick Jupiter, M.D. PERFORMING [facility], [place]  cc:  ------------------------------------------------------------------- LV EF: 60% - 65%  ------------------------------------------------------------------- History: PMH: Acquired from the patient and from the patient&'s chart. Dyspnea, murmur, and bilateral lower extremity edema.  ------------------------------------------------------------------- Study Conclusions  - Left ventricle: The cavity size was normal. Wall thickness was normal. Systolic function was normal. The estimated ejection fraction was in the range of 60% to 65%. Wall motion was normal; there were no regional wall motion abnormalities. The study is not technically sufficient to allow evaluation of LV diastolic function. - Aortic valve: Mildly to moderately calcified annulus. Trileaflet; mildly calcified leaflets. There was trivial regurgitation. - Mitral valve: Calcified annulus. Mildly thickened, mildly calcified leaflets . Mobility was restricted with morphology suggesting rheumatic mitral disease. The findings are consistent with moderate to severe stenosis. There was trivial regurgitation. Mean gradient (D): 9 mm Hg. Valve area by continuity equation (using LVOT flow): 1.02 cm^2. - Left atrium: The atrium was moderately dilated. - Right ventricle: The cavity size was moderately dilated. Systolic function was mildly reduced. - Right atrium: Central venous pressure (est): 8 mm Hg. - Tricuspid valve: There was mild-moderate regurgitation. - Pulmonary arteries: Systolic pressure was severely increased. PA peak pressure: 75 mm Hg (S). - Pericardium, extracardiac: There was no pericardial effusion.  Impressions:  - Normal  LV wall thickness with LVEF 60-65%. Indeterminate diastolic function in setting of atrial fibrillation. Moderate left atrial enlargement.  Rheumatic mitral deformity with evidence of moderate to severe mitral stenosis as outlined above and trivial mitral regurgitation. Sclerotic aortic valve with trivial aortic regurgitation. Moderate RV enlargement with mildly reduced contraction. Mild to moderate tricuspid regurgitation with evidence of severe pulmonary hypertension and PASP 75 mmHg.  Transthoracic echocardiography. M-mode, complete 2D, spectral Doppler, and color Doppler. Birthdate: Patient birthdate: 1940/10/22. Age: Patient is 75 yr old. Sex: Gender: female. BMI: 45.9 kg/m^2. Blood pressure: 110/68 Patient status: Outpatient. Study date: Study date: 03/19/2016. Study time: 03:15 PM.  -------------------------------------------------------------------  ------------------------------------------------------------------- Left ventricle: The cavity size was normal. Wall thickness was normal. Systolic function was normal. The estimated ejection fraction was in the range of 60% to 65%. Wall motion was normal; there were no regional wall motion abnormalities. The study is not technically sufficient to allow evaluation of LV diastolic function.  ------------------------------------------------------------------- Aortic valve: Mildly to moderately calcified annulus. Trileaflet; mildly calcified leaflets. Doppler: There was trivial regurgitation.  ------------------------------------------------------------------- Aorta: Aortic root: The aortic root was normal in size.  ------------------------------------------------------------------- Mitral valve: Calcified annulus. Mildly thickened, mildly calcified leaflets . Mobility was restricted with morphology suggesting rheumatic mitral disease. Doppler: The findings are consistent with moderate to severe stenosis. There was trivial regurgitation. Valve area by continuity equation (using LVOT flow): 1.02 cm^2. Indexed valve area  by continuity equation (using LVOT flow): 0.47 cm^2/m^2. Mean gradient (D): 9 mm Hg. Peak gradient (D): 22 mm Hg.  ------------------------------------------------------------------- Left atrium: The atrium was moderately dilated.  ------------------------------------------------------------------- Right ventricle: The cavity size was moderately dilated. Systolic function was mildly reduced.  ------------------------------------------------------------------- Pulmonic valve: Poorly visualized. Doppler: There was trivial regurgitation.  ------------------------------------------------------------------- Tricuspid valve: The valve appears to be grossly normal. Doppler: There was mild-moderate regurgitation.  ------------------------------------------------------------------- Pulmonary artery: Systolic pressure was severely increased.  ------------------------------------------------------------------- Right atrium: The atrium was normal in size.  ------------------------------------------------------------------- Pericardium: There was no pericardial effusion.  ------------------------------------------------------------------- Systemic veins: Inferior vena cava: The vessel was normal in size. The respirophasic diameter changes were blunted (< 50%).  ------------------------------------------------------------------- Measurements  Left ventricle Value Reference LV ID, ED, PLAX chordal 45.3 mm 43 - 52 LV ID, ES, PLAX chordal 29.4 mm 23 - 38 LV fx shortening, PLAX chordal 35 % >=29 LV PW thickness, ED 9.5 mm --------- IVS/LV PW ratio, ED 0.95 <=1.3 Stroke volume, 2D 75 ml --------- Stroke volume/bsa, 2D 34 ml/m^2  --------- Longitudinal strain, TDI 19 % ---------  Ventricular septum Value Reference IVS thickness, ED 9 mm ---------  LVOT Value Reference LVOT ID, S 19 mm --------- LVOT area 2.84 cm^2 --------- LVOT peak velocity, S 120 cm/s --------- LVOT mean velocity, S 81.3 cm/s --------- LVOT VTI, S 26.5 cm --------- LVOT peak gradient, S 6 mm Hg ---------  Aorta Value Reference Aortic root ID, ED 35 mm ---------  Left atrium Value Reference LA ID, A-P, ES 45 mm --------- LA ID/bsa, A-P 2.05 cm/m^2 <=2.2 LA volume, S 73.5 ml --------- LA volume/bsa, S 33.5 ml/m^2 --------- LA volume, ES, 1-p A4C 99.4 ml --------- LA volume/bsa, ES, 1-p A4C 45.3 ml/m^2 --------- LA volume, ES, 1-p A2C 52.4 ml --------- LA volume/bsa, ES, 1-p A2C 23.9 ml/m^2 ---------  Mitral valve Value Reference Mitral E-wave peak velocity 237 cm/s --------- Mitral A-wave peak velocity 57.6 cm/s --------- Mitral mean velocity, D 132 cm/s --------- Mitral deceleration time (H) 324 ms 150 - 230 Mitral mean gradient, D 9 mm Hg --------- Mitral  peak gradient, D 22 mm Hg  --------- Mitral E/A ratio, peak 4.1 --------- Mitral valve area, LVOT continuity 1.02 cm^2 --------- Mitral valve area/bsa, LVOT 0.47 cm^2/m^2 --------- continuity Mitral annulus VTI, D 73.8 cm ---------  Pulmonary arteries Value Reference PA pressure, S, DP (H) 75 mm Hg <=30  Tricuspid valve Value Reference Tricuspid regurg peak velocity 409 cm/s --------- Tricuspid peak RV-RA gradient 67 mm Hg ---------  Systemic veins Value Reference Estimated CVP 8 mm Hg ---------  Right ventricle Value Reference RV pressure, S, DP (H) 75 mm Hg <=30  Pulmonic valve Value Reference Pulmonic valve peak velocity, S 147 cm/s --------- Pulmonic regurg velocity, ED 116 cm/s --------- Pulmonic regurg gradient, ED 5 mm Hg ---------  Legend: (L) and (H) mark values outside specified reference range.  ------------------------------------------------------------------- Prepared and Electronically Authenticated by  Nona DellSamuel McDowell, M.D. 2017-06-22T16:28:22   CT ANGIOGRAPHY CHEST, ABDOMEN AND PELVIS  TECHNIQUE: Multidetector CT imaging through the chest, abdomen and pelvis was performed using the standard protocol during bolus administration of intravenous contrast. Multiplanar reconstructed images and MIPs were obtained and reviewed to evaluate the vascular anatomy.  CONTRAST: 80 mL of Isovue 370.  COMPARISON: No priors.  FINDINGS: CTA CHEST FINDINGS  Cardiovascular: Cardiomegaly with biatrial dilatation and right ventricular  dilatation. Straightening of the interventricular septum, which could suggest elevated right-sided heart pressures. There is aortic atherosclerosis, as well as atherosclerosis of the great vessels of the mediastinum and the coronary arteries, including calcified atherosclerotic plaque in the distal left main and left anterior descending coronary arteries. No definite aortic valve calcifications. Pulmonary artery is mildly dilated measuring 3.3 cm in diameter.  Mediastinum/Nodes: No pathologically enlarged mediastinal or hilar lymph nodes. Small hiatal hernia. No axillary lymphadenopathy.  Lungs/Pleura: Assessment of the lungs is slightly limited by a large amount of respiratory motion. Small left-sided Bochdalek's hernia incidentally noted. No acute consolidative airspace disease. No pleural effusions. No definite suspicious appearing pulmonary nodules or masses.  Musculoskeletal/Soft Tissues: There are no aggressive appearing lytic or blastic lesions noted in the visualized portions of the skeleton.  CTA ABDOMEN AND PELVIS FINDINGS  Hepatobiliary: The liver has a shrunken appearance and nodular contour, indicative of early changes of cirrhosis. Diffuse low attenuation throughout the hepatic parenchyma indicative of a hepatic steatosis. No cystic or solid hepatic lesions. No intra or extrahepatic biliary ductal dilatation. Gallbladder is normal in appearance.  Pancreas: No pancreatic mass. No pancreatic ductal dilatation. No pancreatic or peripancreatic fluid or inflammatory changes.  Spleen: Unremarkable.  Adrenals/Urinary Tract: Bilateral adrenal nodules measuring 1.4 x 1.1 cm on the right and 1.7 x 1.2 cm on the left are indeterminate. Right kidney is normal in appearance. Partially exophytic 3.8 cm simple cyst in the anterior aspect of the interpolar region of the left kidney. No hydroureteronephrosis. Urinary bladder is normal in appearance.  Stomach/Bowel:  Normal appearance of the stomach. Small duodenal diverticulum extending off the medial aspect of the second portion of the duodenum incidentally noted. No surrounding inflammatory changes. No pathologic dilatation of small bowel or colon. A few scattered colonic diverticulae are noted, without surrounding inflammatory changes to suggest an acute diverticulitis at this time. Normal appendix.  Vascular/Lymphatic: Aortic atherosclerosis, without evidence of aneurysm or dissection in the abdominal or pelvic vasculature.  Reproductive: Uterus and ovaries are atrophic.  Other: No significant volume of ascites. No pneumoperitoneum.  Musculoskeletal: There are no aggressive appearing lytic or blastic lesions noted in the visualized portions of the skeleton.  VASCULAR MEASUREMENTS:  AORTA:  Minimal Aortic  Diameter - 13 x 13 mm  Severity of Aortic Calcification - mild to moderate  RIGHT PELVIS:  Right Common Iliac Artery -  Minimal Diameter - 8.4 x 7.8 mm  Tortuosity - moderate  Calcification - mild to moderate  Right External Iliac Artery -  Minimal Diameter - 7.5 x 7.9 mm  Tortuosity - mild  Calcification - none  Right Common Femoral Artery -  Minimal Diameter - 6.8 x 6.4 mm  Tortuosity - mild  Calcification - none  LEFT PELVIS:  Left Common Iliac Artery -  Minimal Diameter - 8.7 x 7.4 mm  Tortuosity - mild  Calcification - minimal  Left External Iliac Artery -  Minimal Diameter - 7.1 x 6.7 mm  Tortuosity - mild  Calcification - none  Left Common Femoral Artery -  Minimal Diameter - 7.1 x 7.5 mm  Tortuosity - mild  Calcification - none  Review of the MIP images confirms the above findings.  IMPRESSION: 1. Cardiomegaly with biatrial dilatation and right ventricular dilatation, with straightening of the interventricular septum, suggestive of elevated right-sided heart pressures. There is also mild  dilatation of the pulmonic trunk, compatible with the reported clinical history of pulmonary arterial hypertension. 2. Aortic atherosclerosis, in addition to left main and left anterior descending coronary artery disease. Assessment for potential risk factor modification, dietary therapy or pharmacologic therapy may be warranted, if clinically indicated. 3. Hepatic steatosis with morphologic changes in the liver indicative of early cirrhosis, as above. 4. Colonic diverticulosis without findings to suggest an acute diverticulitis at this time. 5. Additional incidental findings, as above.   Electronically Signed By: Trudie Reed M.D. 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 I have personally reviewed the patient's recent transthoracic echocardiogram and the transesophageal echocardiogram performed last year. 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 There is very mild mitral regurgitation. There is some leaflet calcification and moderate calcification of the mitral annulus.  There is severe left atrial enlargement. There is normal left ventricular size and systolic function. Right ventricle is mildly dilated. There is at least moderate tricuspid regurgitation. 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 The patient's pulmonary hypertension and chronic symptoms of congestive heart failure or further exacerbated by the presence of chronic persistent atrial fibrillation that has failed DC cardioversion and morbid obesity. Options include continued medical therapy versus mitral valve replacement, tricuspid valve repair, and possible Maze procedure. Risks associated with surgery may be fairly high in this patient because of her advanced age, numerous comorbid medical problems, and significant physical deconditioning.  Because of the patient's morbid obesity and severe pulmonary hypertension, I feel that the patient would not be a relatively good candidate for minimally invasive approach and I favor a conventional median sternotomy for surgery .   Plan:  I have again reviewed the indications, risks, and potential benefits of mitral valve replacement, tricuspid valve repair, and Maze procedure with the patient and her daughter in the office this afternoon. Alternative treatment strategies have been discussed.   Expectations for the patient's postoperative convalescence of been discussed. In particular, the patient understands that even in the event of a completely uncomplicated postoperative course she will need short-term placement in some type of skilled nursing facility for physical rehabilitation.  They understand and accept all potential risks of surgery including but not limited to risk of death, stroke or other neurologic complication, myocardial infarction, congestive heart failure, respiratory  failure, renal failure, bleeding requiring transfusion and/or reexploration, arrhythmia, infection or other wound complications, pneumonia, pleural and/or pericardial effusion, pulmonary embolus, aortic dissection or other major vascular complication, or delayed complications related to valve repair or replacement including but not limited to structural valve deterioration and failure, thrombosis, embolization, endocarditis, or paravalvular leak.  The relative risks and benefits of performing a maze procedure at the time of their surgery was discussed at length, including the expected likelihood of long term freedom from recurrent symptomatic atrial fibrillation and/or atrial flutter.  All of their questions have been answered.   I spent in excess of 15 minutes during the conduct of this office consultation and >50% of this time involved direct face-to-face encounter with the patient for counseling and/or coordination of their care.    Salvatore Decent. Cornelius Moras, MD 06/22/2016 11:40 AM

## 2016-06-22 NOTE — Progress Notes (Signed)
Pre-op Cardiac Surgery  Carotid Findings:  No evidence of a significant stenosis noted bilaterally.  Upper Extremity Right Left  Brachial Pressures 132 143  Radial Waveforms Triphasic Triphasic  Ulnar Waveforms Triphasic Triphasic  Palmar Arch (Allen's Test) WNL Abnormal   Findings:  Right side within normal limits. Left radial artery decreased greater than 50% with compression.    Marilynne Halstedita Sheresa Cullop, BS, RDMS, RVT

## 2016-06-22 NOTE — Progress Notes (Signed)
PCP - Joette CatchingLeonard Nyland Cardiologist -  Dr. Wyline MoodBranch eden  Chest x-ray - 06/22/16 EKG - 06/22/16 Stress Test - denies ECHO - 03/19/16 Cardiac Cath - 04/16/16  Patient stopped her Xarelto on 06/18/16    Patient denies shortness of breath, fever, cough and chest pain at PAT appointment

## 2016-06-23 LAB — VAS US DOPPLER PRE CABG
LCCAPDIAS: 15 cm/s
LCCAPSYS: 73 cm/s
LEFT ECA DIAS: -11 cm/s
LEFT VERTEBRAL DIAS: -8 cm/s
LICADSYS: 77 cm/s
LICAPSYS: -66 cm/s
Left CCA dist dias: 14 cm/s
Left CCA dist sys: 72 cm/s
Left ICA dist dias: 23 cm/s
Left ICA prox dias: -13 cm/s
RCCAPDIAS: 15 cm/s
RIGHT ECA DIAS: -7 cm/s
Right CCA prox sys: 101 cm/s
Right cca dist sys: -72 cm/s

## 2016-06-23 LAB — HEMOGLOBIN A1C
HEMOGLOBIN A1C: 5.3 % (ref 4.8–5.6)
Mean Plasma Glucose: 105 mg/dL

## 2016-06-23 NOTE — Progress Notes (Signed)
I spoke with Alycia Rossettiyan yesterday about potassium level at 1500. Alycia RossettiRyan stated that Dr. Cornelius Moraswen would look over labs

## 2016-06-24 ENCOUNTER — Ambulatory Visit: Payer: Medicare Other | Admitting: Cardiology

## 2016-06-24 ENCOUNTER — Encounter (HOSPITAL_COMMUNITY): Payer: Self-pay | Admitting: Certified Registered Nurse Anesthetist

## 2016-06-24 MED ORDER — PHENYLEPHRINE HCL 10 MG/ML IJ SOLN
30.0000 ug/min | INTRAVENOUS | Status: DC
  Filled 2016-06-24: qty 2

## 2016-06-24 MED ORDER — PLASMA-LYTE 148 IV SOLN
INTRAVENOUS | Status: DC
  Filled 2016-06-24: qty 2.5

## 2016-06-24 MED ORDER — DOPAMINE-DEXTROSE 3.2-5 MG/ML-% IV SOLN
0.0000 ug/kg/min | INTRAVENOUS | Status: DC
  Filled 2016-06-24: qty 250

## 2016-06-24 MED ORDER — HEPARIN SODIUM (PORCINE) 1000 UNIT/ML IJ SOLN
INTRAMUSCULAR | Status: DC
  Filled 2016-06-24: qty 30

## 2016-06-24 MED ORDER — DEXTROSE 5 % IV SOLN
750.0000 mg | INTRAVENOUS | Status: DC
  Filled 2016-06-24: qty 750

## 2016-06-24 MED ORDER — SODIUM CHLORIDE 0.9 % IV SOLN
INTRAVENOUS | Status: DC
  Filled 2016-06-24: qty 40

## 2016-06-24 MED ORDER — DEXMEDETOMIDINE HCL IN NACL 400 MCG/100ML IV SOLN
0.1000 ug/kg/h | INTRAVENOUS | Status: DC
  Filled 2016-06-24: qty 100

## 2016-06-24 MED ORDER — NITROGLYCERIN IN D5W 200-5 MCG/ML-% IV SOLN
2.0000 ug/min | INTRAVENOUS | Status: AC
  Administered 2016-06-25: 5 ug/min via INTRAVENOUS
  Filled 2016-06-24: qty 250

## 2016-06-24 MED ORDER — DEXTROSE 5 % IV SOLN
1.5000 g | INTRAVENOUS | Status: AC
  Administered 2016-06-25: .75 g via INTRAVENOUS
  Administered 2016-06-25: 1.5 g via INTRAVENOUS
  Filled 2016-06-24: qty 1.5

## 2016-06-24 MED ORDER — MAGNESIUM SULFATE 50 % IJ SOLN
40.0000 meq | INTRAMUSCULAR | Status: DC
  Filled 2016-06-24: qty 10

## 2016-06-24 MED ORDER — SODIUM CHLORIDE 0.9 % IV SOLN
INTRAVENOUS | Status: DC
  Filled 2016-06-24: qty 2.5

## 2016-06-24 MED ORDER — VANCOMYCIN HCL 1000 MG IV SOLR
INTRAVENOUS | Status: DC
  Filled 2016-06-24: qty 1000

## 2016-06-24 MED ORDER — EPINEPHRINE HCL 1 MG/ML IJ SOLN
0.0000 ug/min | INTRAVENOUS | Status: DC
  Filled 2016-06-24 (×2): qty 4

## 2016-06-24 MED ORDER — VANCOMYCIN HCL 10 G IV SOLR
1500.0000 mg | INTRAVENOUS | Status: AC
  Administered 2016-06-25: 1500 mg via INTRAVENOUS
  Filled 2016-06-24: qty 1500

## 2016-06-24 MED ORDER — POTASSIUM CHLORIDE 2 MEQ/ML IV SOLN
80.0000 meq | INTRAVENOUS | Status: DC
  Filled 2016-06-24: qty 40

## 2016-06-24 NOTE — Anesthesia Preprocedure Evaluation (Addendum)
Anesthesia Evaluation  Patient identified by MRN, date of birth, ID band Patient awake    Reviewed: Allergy & Precautions, NPO status , Patient's Chart, lab work & pertinent test results  Airway Mallampati: II  TM Distance: >3 FB Neck ROM: Full    Dental  (+) Dental Advisory Given, Edentulous Upper, Edentulous Lower   Pulmonary asthma , COPD,  COPD inhaler, former smoker,    Pulmonary exam normal breath sounds clear to auscultation       Cardiovascular hypertension, Pt. on medications + CAD (non obstructive), + Past MI, + Peripheral Vascular Disease and +CHF  + dysrhythmias Atrial Fibrillation + Valvular Problems/Murmurs AI and MR  Rhythm:Irregular Rate:Normal  Echo 03/19/16: Study Conclusions  - Left ventricle: The cavity size was normal. Wall thickness wasnormal. Systolic function was normal. The estimated ejectionfraction was in the range of 60% to 65%. Wall motion was normal;there were no regional wall motion abnormalities. The study isnot technically sufficient to allow evaluation of LV diastolicfunction. - Aortic valve: Mildly to moderately calcified annulus. Trileaflet;mildly calcified leaflets. There was trivial regurgitation. - Mitral valve: Calcified annulus. Mildly thickened, mildly calcified leaflets . Mobility was restricted with morphology suggesting rheumatic mitral disease. The findings are consistentwith moderate to severe stenosis. There was trivialregurgitation. Mean gradient (D): 9 mm Hg. Valve area bycontinuity equation (using LVOT flow): 1.02 cm^2. - Left atrium: The atrium was moderately dilated. - Right ventricle: The cavity size was moderately dilated. Systolicfunction was mildly reduced. - Right atrium: Central venous pressure (est): 8 mm Hg. - Tricuspid valve: There was mild-moderate regurgitation. - Pulmonary arteries: Systolic pressure was severely increased. PApeak pressure: 75 mm Hg (S). -  Pericardium, extracardiac: There was no pericardial effusion.  Impressions:  - Normal LV wall thickness with LVEF 60-65%. Indeterminatediastolic function in setting of atrial fibrillation. Moderateleft atrial enlargement. Rheumatic mitral deformity with evidenceof moderate to severe mitral stenosis as outlined above andtrivial mitral regurgitation. Sclerotic aortic valve with trivialaortic regurgitation. Moderate RV enlargement with mildly reducedcontraction. Mild to moderate tricuspid regurgitation withevidence of severe pulmonary hypertension and PASP 75 mmHg.   Neuro/Psych negative neurological ROS     GI/Hepatic negative GI ROS, Neg liver ROS,   Endo/Other  Hypothyroidism Morbid obesity  Renal/GU Renal InsufficiencyRenal disease     Musculoskeletal  (+) Arthritis , Osteoarthritis,    Abdominal   Peds  Hematology  (+) Blood dyscrasia (Xarelto), ,   Anesthesia Other Findings Day of surgery medications reviewed with the patient.  Reproductive/Obstetrics                            Anesthesia Physical Anesthesia Plan  ASA: IV  Anesthesia Plan: General   Post-op Pain Management:    Induction: Intravenous  Airway Management Planned: Oral ETT  Additional Equipment: Arterial line, TEE, CVP, PA Cath and Ultrasound Guidance Line Placement  Intra-op Plan:   Post-operative Plan: Post-operative intubation/ventilation  Informed Consent: I have reviewed the patients History and Physical, chart, labs and discussed the procedure including the risks, benefits and alternatives for the proposed anesthesia with the patient or authorized representative who has indicated his/her understanding and acceptance.   Dental advisory given  Plan Discussed with: CRNA  Anesthesia Plan Comments: (Risks/benefits of general anesthesia discussed with patient including risk of damage to teeth, lips, gum, and tongue, nausea/vomiting, allergic reactions to  medications, and the possibility of heart attack, stroke and death.  All patient questions answered.  Patient wishes to proceed.)  Anesthesia Quick Evaluation  

## 2016-06-25 ENCOUNTER — Inpatient Hospital Stay (HOSPITAL_COMMUNITY)
Admission: RE | Admit: 2016-06-25 | Discharge: 2016-07-29 | DRG: 216 | Disposition: E | Payer: Medicare Other | Source: Ambulatory Visit | Attending: Thoracic Surgery (Cardiothoracic Vascular Surgery) | Admitting: Thoracic Surgery (Cardiothoracic Vascular Surgery)

## 2016-06-25 ENCOUNTER — Inpatient Hospital Stay (HOSPITAL_COMMUNITY): Payer: Medicare Other | Admitting: Emergency Medicine

## 2016-06-25 ENCOUNTER — Inpatient Hospital Stay (HOSPITAL_COMMUNITY): Payer: Medicare Other

## 2016-06-25 ENCOUNTER — Encounter (HOSPITAL_COMMUNITY): Payer: Self-pay | Admitting: *Deleted

## 2016-06-25 ENCOUNTER — Encounter (HOSPITAL_COMMUNITY)
Admission: RE | Disposition: E | Payer: Self-pay | Source: Ambulatory Visit | Attending: Thoracic Surgery (Cardiothoracic Vascular Surgery)

## 2016-06-25 DIAGNOSIS — E039 Hypothyroidism, unspecified: Secondary | ICD-10-CM | POA: Diagnosis present

## 2016-06-25 DIAGNOSIS — Z953 Presence of xenogenic heart valve: Secondary | ICD-10-CM

## 2016-06-25 DIAGNOSIS — Z23 Encounter for immunization: Secondary | ICD-10-CM

## 2016-06-25 DIAGNOSIS — E43 Unspecified severe protein-calorie malnutrition: Secondary | ICD-10-CM | POA: Diagnosis present

## 2016-06-25 DIAGNOSIS — Z8679 Personal history of other diseases of the circulatory system: Secondary | ICD-10-CM

## 2016-06-25 DIAGNOSIS — E875 Hyperkalemia: Secondary | ICD-10-CM | POA: Diagnosis not present

## 2016-06-25 DIAGNOSIS — E876 Hypokalemia: Secondary | ICD-10-CM | POA: Diagnosis not present

## 2016-06-25 DIAGNOSIS — I361 Nonrheumatic tricuspid (valve) insufficiency: Secondary | ICD-10-CM

## 2016-06-25 DIAGNOSIS — I10 Essential (primary) hypertension: Secondary | ICD-10-CM | POA: Diagnosis present

## 2016-06-25 DIAGNOSIS — Z9841 Cataract extraction status, right eye: Secondary | ICD-10-CM

## 2016-06-25 DIAGNOSIS — J9621 Acute and chronic respiratory failure with hypoxia: Secondary | ICD-10-CM | POA: Diagnosis not present

## 2016-06-25 DIAGNOSIS — J90 Pleural effusion, not elsewhere classified: Secondary | ICD-10-CM

## 2016-06-25 DIAGNOSIS — R0602 Shortness of breath: Secondary | ICD-10-CM

## 2016-06-25 DIAGNOSIS — I509 Heart failure, unspecified: Secondary | ICD-10-CM

## 2016-06-25 DIAGNOSIS — J9819 Other pulmonary collapse: Secondary | ICD-10-CM | POA: Diagnosis not present

## 2016-06-25 DIAGNOSIS — I4891 Unspecified atrial fibrillation: Secondary | ICD-10-CM

## 2016-06-25 DIAGNOSIS — J9811 Atelectasis: Secondary | ICD-10-CM | POA: Diagnosis not present

## 2016-06-25 DIAGNOSIS — Z9911 Dependence on respirator [ventilator] status: Secondary | ICD-10-CM

## 2016-06-25 DIAGNOSIS — I313 Pericardial effusion (noninflammatory): Secondary | ICD-10-CM

## 2016-06-25 DIAGNOSIS — I050 Rheumatic mitral stenosis: Secondary | ICD-10-CM | POA: Diagnosis present

## 2016-06-25 DIAGNOSIS — T17890A Other foreign object in other parts of respiratory tract causing asphyxiation, initial encounter: Secondary | ICD-10-CM | POA: Diagnosis not present

## 2016-06-25 DIAGNOSIS — J9610 Chronic respiratory failure, unspecified whether with hypoxia or hypercapnia: Secondary | ICD-10-CM | POA: Diagnosis present

## 2016-06-25 DIAGNOSIS — E1122 Type 2 diabetes mellitus with diabetic chronic kidney disease: Secondary | ICD-10-CM | POA: Diagnosis present

## 2016-06-25 DIAGNOSIS — Z9842 Cataract extraction status, left eye: Secondary | ICD-10-CM

## 2016-06-25 DIAGNOSIS — I071 Rheumatic tricuspid insufficiency: Secondary | ICD-10-CM | POA: Diagnosis present

## 2016-06-25 DIAGNOSIS — I130 Hypertensive heart and chronic kidney disease with heart failure and stage 1 through stage 4 chronic kidney disease, or unspecified chronic kidney disease: Secondary | ICD-10-CM | POA: Diagnosis present

## 2016-06-25 DIAGNOSIS — E869 Volume depletion, unspecified: Secondary | ICD-10-CM | POA: Diagnosis not present

## 2016-06-25 DIAGNOSIS — N170 Acute kidney failure with tubular necrosis: Secondary | ICD-10-CM | POA: Diagnosis not present

## 2016-06-25 DIAGNOSIS — R7989 Other specified abnormal findings of blood chemistry: Secondary | ICD-10-CM | POA: Diagnosis not present

## 2016-06-25 DIAGNOSIS — D62 Acute posthemorrhagic anemia: Secondary | ICD-10-CM | POA: Diagnosis not present

## 2016-06-25 DIAGNOSIS — N183 Chronic kidney disease, stage 3 unspecified: Secondary | ICD-10-CM | POA: Diagnosis present

## 2016-06-25 DIAGNOSIS — Z4659 Encounter for fitting and adjustment of other gastrointestinal appliance and device: Secondary | ICD-10-CM

## 2016-06-25 DIAGNOSIS — I493 Ventricular premature depolarization: Secondary | ICD-10-CM | POA: Diagnosis not present

## 2016-06-25 DIAGNOSIS — N390 Urinary tract infection, site not specified: Secondary | ICD-10-CM | POA: Diagnosis not present

## 2016-06-25 DIAGNOSIS — R570 Cardiogenic shock: Secondary | ICD-10-CM | POA: Diagnosis not present

## 2016-06-25 DIAGNOSIS — Z09 Encounter for follow-up examination after completed treatment for conditions other than malignant neoplasm: Secondary | ICD-10-CM

## 2016-06-25 DIAGNOSIS — Z886 Allergy status to analgesic agent status: Secondary | ICD-10-CM

## 2016-06-25 DIAGNOSIS — Z6841 Body Mass Index (BMI) 40.0 and over, adult: Secondary | ICD-10-CM | POA: Diagnosis not present

## 2016-06-25 DIAGNOSIS — I099 Rheumatic heart disease, unspecified: Secondary | ICD-10-CM | POA: Diagnosis present

## 2016-06-25 DIAGNOSIS — I340 Nonrheumatic mitral (valve) insufficiency: Secondary | ICD-10-CM

## 2016-06-25 DIAGNOSIS — R262 Difficulty in walking, not elsewhere classified: Secondary | ICD-10-CM

## 2016-06-25 DIAGNOSIS — N179 Acute kidney failure, unspecified: Secondary | ICD-10-CM | POA: Diagnosis not present

## 2016-06-25 DIAGNOSIS — I5082 Biventricular heart failure: Secondary | ICD-10-CM | POA: Diagnosis present

## 2016-06-25 DIAGNOSIS — G8929 Other chronic pain: Secondary | ICD-10-CM | POA: Diagnosis present

## 2016-06-25 DIAGNOSIS — Z961 Presence of intraocular lens: Secondary | ICD-10-CM | POA: Diagnosis present

## 2016-06-25 DIAGNOSIS — E11649 Type 2 diabetes mellitus with hypoglycemia without coma: Secondary | ICD-10-CM | POA: Diagnosis present

## 2016-06-25 DIAGNOSIS — I083 Combined rheumatic disorders of mitral, aortic and tricuspid valves: Principal | ICD-10-CM | POA: Diagnosis present

## 2016-06-25 DIAGNOSIS — I481 Persistent atrial fibrillation: Secondary | ICD-10-CM | POA: Diagnosis present

## 2016-06-25 DIAGNOSIS — Z7901 Long term (current) use of anticoagulants: Secondary | ICD-10-CM

## 2016-06-25 DIAGNOSIS — J9620 Acute and chronic respiratory failure, unspecified whether with hypoxia or hypercapnia: Secondary | ICD-10-CM | POA: Diagnosis not present

## 2016-06-25 DIAGNOSIS — Z7951 Long term (current) use of inhaled steroids: Secondary | ICD-10-CM

## 2016-06-25 DIAGNOSIS — J9809 Other diseases of bronchus, not elsewhere classified: Secondary | ICD-10-CM | POA: Diagnosis not present

## 2016-06-25 DIAGNOSIS — J9589 Other postprocedural complications and disorders of respiratory system, not elsewhere classified: Secondary | ICD-10-CM | POA: Diagnosis not present

## 2016-06-25 DIAGNOSIS — M6281 Muscle weakness (generalized): Secondary | ICD-10-CM

## 2016-06-25 DIAGNOSIS — Y95 Nosocomial condition: Secondary | ICD-10-CM | POA: Diagnosis not present

## 2016-06-25 DIAGNOSIS — I5033 Acute on chronic diastolic (congestive) heart failure: Secondary | ICD-10-CM | POA: Diagnosis present

## 2016-06-25 DIAGNOSIS — I2721 Secondary pulmonary arterial hypertension: Secondary | ICD-10-CM | POA: Diagnosis present

## 2016-06-25 DIAGNOSIS — I252 Old myocardial infarction: Secondary | ICD-10-CM

## 2016-06-25 DIAGNOSIS — H9190 Unspecified hearing loss, unspecified ear: Secondary | ICD-10-CM | POA: Diagnosis present

## 2016-06-25 DIAGNOSIS — J440 Chronic obstructive pulmonary disease with acute lower respiratory infection: Secondary | ICD-10-CM | POA: Diagnosis present

## 2016-06-25 DIAGNOSIS — X58XXXA Exposure to other specified factors, initial encounter: Secondary | ICD-10-CM | POA: Diagnosis not present

## 2016-06-25 DIAGNOSIS — B372 Candidiasis of skin and nail: Secondary | ICD-10-CM | POA: Diagnosis present

## 2016-06-25 DIAGNOSIS — I700 Atherosclerosis of aorta: Secondary | ICD-10-CM | POA: Diagnosis present

## 2016-06-25 DIAGNOSIS — J811 Chronic pulmonary edema: Secondary | ICD-10-CM | POA: Diagnosis not present

## 2016-06-25 DIAGNOSIS — E0590 Thyrotoxicosis, unspecified without thyrotoxic crisis or storm: Secondary | ICD-10-CM | POA: Diagnosis present

## 2016-06-25 DIAGNOSIS — J9600 Acute respiratory failure, unspecified whether with hypoxia or hypercapnia: Secondary | ICD-10-CM

## 2016-06-25 DIAGNOSIS — Z515 Encounter for palliative care: Secondary | ICD-10-CM | POA: Diagnosis present

## 2016-06-25 DIAGNOSIS — J9601 Acute respiratory failure with hypoxia: Secondary | ICD-10-CM | POA: Diagnosis not present

## 2016-06-25 DIAGNOSIS — I482 Chronic atrial fibrillation, unspecified: Secondary | ICD-10-CM | POA: Diagnosis present

## 2016-06-25 DIAGNOSIS — I2510 Atherosclerotic heart disease of native coronary artery without angina pectoris: Secondary | ICD-10-CM | POA: Diagnosis present

## 2016-06-25 DIAGNOSIS — Z9689 Presence of other specified functional implants: Secondary | ICD-10-CM

## 2016-06-25 DIAGNOSIS — Z9889 Other specified postprocedural states: Secondary | ICD-10-CM

## 2016-06-25 DIAGNOSIS — H3530 Unspecified macular degeneration: Secondary | ICD-10-CM | POA: Diagnosis present

## 2016-06-25 DIAGNOSIS — J9690 Respiratory failure, unspecified, unspecified whether with hypoxia or hypercapnia: Secondary | ICD-10-CM

## 2016-06-25 DIAGNOSIS — I50810 Right heart failure, unspecified: Secondary | ICD-10-CM | POA: Diagnosis present

## 2016-06-25 DIAGNOSIS — M545 Low back pain: Secondary | ICD-10-CM | POA: Diagnosis present

## 2016-06-25 DIAGNOSIS — I959 Hypotension, unspecified: Secondary | ICD-10-CM | POA: Diagnosis not present

## 2016-06-25 DIAGNOSIS — J9622 Acute and chronic respiratory failure with hypercapnia: Secondary | ICD-10-CM | POA: Diagnosis not present

## 2016-06-25 DIAGNOSIS — Z87891 Personal history of nicotine dependence: Secondary | ICD-10-CM

## 2016-06-25 DIAGNOSIS — I872 Venous insufficiency (chronic) (peripheral): Secondary | ICD-10-CM | POA: Diagnosis present

## 2016-06-25 DIAGNOSIS — Z66 Do not resuscitate: Secondary | ICD-10-CM | POA: Diagnosis present

## 2016-06-25 DIAGNOSIS — I3139 Other pericardial effusion (noninflammatory): Secondary | ICD-10-CM

## 2016-06-25 DIAGNOSIS — J189 Pneumonia, unspecified organism: Secondary | ICD-10-CM | POA: Diagnosis not present

## 2016-06-25 DIAGNOSIS — F419 Anxiety disorder, unspecified: Secondary | ICD-10-CM | POA: Diagnosis present

## 2016-06-25 DIAGNOSIS — Z79899 Other long term (current) drug therapy: Secondary | ICD-10-CM

## 2016-06-25 DIAGNOSIS — T502X5A Adverse effect of carbonic-anhydrase inhibitors, benzothiadiazides and other diuretics, initial encounter: Secondary | ICD-10-CM | POA: Diagnosis not present

## 2016-06-25 DIAGNOSIS — D638 Anemia in other chronic diseases classified elsewhere: Secondary | ICD-10-CM | POA: Diagnosis present

## 2016-06-25 DIAGNOSIS — I272 Pulmonary hypertension, unspecified: Secondary | ICD-10-CM | POA: Diagnosis present

## 2016-06-25 DIAGNOSIS — I38 Endocarditis, valve unspecified: Secondary | ICD-10-CM | POA: Diagnosis present

## 2016-06-25 DIAGNOSIS — D6489 Other specified anemias: Secondary | ICD-10-CM | POA: Diagnosis present

## 2016-06-25 DIAGNOSIS — E785 Hyperlipidemia, unspecified: Secondary | ICD-10-CM | POA: Diagnosis present

## 2016-06-25 DIAGNOSIS — Z8249 Family history of ischemic heart disease and other diseases of the circulatory system: Secondary | ICD-10-CM

## 2016-06-25 HISTORY — DX: Right heart failure, unspecified: I50.810

## 2016-06-25 HISTORY — PX: TEE WITHOUT CARDIOVERSION: SHX5443

## 2016-06-25 HISTORY — PX: MAZE: SHX5063

## 2016-06-25 HISTORY — PX: MITRAL VALVE REPLACEMENT: SHX147

## 2016-06-25 HISTORY — PX: TRICUSPID VALVE REPLACEMENT: SHX816

## 2016-06-25 HISTORY — DX: Other specified postprocedural states: Z98.890

## 2016-06-25 HISTORY — DX: Presence of xenogenic heart valve: Z95.3

## 2016-06-25 HISTORY — DX: Personal history of other diseases of the circulatory system: Z86.79

## 2016-06-25 LAB — CBC
HCT: 31.8 % — ABNORMAL LOW (ref 36.0–46.0)
HCT: 29.8 % — ABNORMAL LOW (ref 36.0–46.0)
HEMOGLOBIN: 9.7 g/dL — AB (ref 12.0–15.0)
Hemoglobin: 10.5 g/dL — ABNORMAL LOW (ref 12.0–15.0)
MCH: 32 pg (ref 26.0–34.0)
MCH: 31.8 pg (ref 26.0–34.0)
MCHC: 33 g/dL (ref 30.0–36.0)
MCHC: 32.6 g/dL (ref 30.0–36.0)
MCV: 97 fL (ref 78.0–100.0)
MCV: 97.7 fL (ref 78.0–100.0)
PLATELETS: 144 10*3/uL — AB (ref 150–400)
Platelets: 140 10*3/uL — ABNORMAL LOW (ref 150–400)
RBC: 3.28 MIL/uL — AB (ref 3.87–5.11)
RBC: 3.05 MIL/uL — ABNORMAL LOW (ref 3.87–5.11)
RDW: 14.4 % (ref 11.5–15.5)
RDW: 14.6 % (ref 11.5–15.5)
WBC: 12 10*3/uL — AB (ref 4.0–10.5)
WBC: 11.5 10*3/uL — ABNORMAL HIGH (ref 4.0–10.5)

## 2016-06-25 LAB — POCT I-STAT, CHEM 8
BUN: 23 mg/dL — ABNORMAL HIGH (ref 6–20)
BUN: 23 mg/dL — ABNORMAL HIGH (ref 6–20)
BUN: 27 mg/dL — ABNORMAL HIGH (ref 6–20)
BUN: 27 mg/dL — ABNORMAL HIGH (ref 6–20)
BUN: 24 mg/dL — ABNORMAL HIGH (ref 6–20)
BUN: 20 mg/dL (ref 6–20)
CALCIUM ION: 1 mmol/L — AB (ref 1.15–1.40)
CALCIUM ION: 1.01 mmol/L — AB (ref 1.15–1.40)
CALCIUM ION: 1.2 mmol/L (ref 1.15–1.40)
CHLORIDE: 92 mmol/L — AB (ref 101–111)
CHLORIDE: 91 mmol/L — AB (ref 101–111)
CHLORIDE: 96 mmol/L — AB (ref 101–111)
CHLORIDE: 100 mmol/L — AB (ref 101–111)
CREATININE: 1.1 mg/dL — AB (ref 0.44–1.00)
CREATININE: 1.1 mg/dL — AB (ref 0.44–1.00)
Calcium, Ion: 1.09 mmol/L — ABNORMAL LOW (ref 1.15–1.40)
Calcium, Ion: 0.97 mmol/L — ABNORMAL LOW (ref 1.15–1.40)
Calcium, Ion: 1.16 mmol/L (ref 1.15–1.40)
Chloride: 88 mmol/L — ABNORMAL LOW (ref 101–111)
Chloride: 93 mmol/L — ABNORMAL LOW (ref 101–111)
Creatinine, Ser: 1.1 mg/dL — ABNORMAL HIGH (ref 0.44–1.00)
Creatinine, Ser: 1.1 mg/dL — ABNORMAL HIGH (ref 0.44–1.00)
Creatinine, Ser: 1.1 mg/dL — ABNORMAL HIGH (ref 0.44–1.00)
Creatinine, Ser: 1.2 mg/dL — ABNORMAL HIGH (ref 0.44–1.00)
GLUCOSE: 126 mg/dL — AB (ref 65–99)
GLUCOSE: 127 mg/dL — AB (ref 65–99)
GLUCOSE: 165 mg/dL — AB (ref 65–99)
GLUCOSE: 181 mg/dL — AB (ref 65–99)
Glucose, Bld: 152 mg/dL — ABNORMAL HIGH (ref 65–99)
Glucose, Bld: 139 mg/dL — ABNORMAL HIGH (ref 65–99)
HCT: 30 % — ABNORMAL LOW (ref 36.0–46.0)
HCT: 26 % — ABNORMAL LOW (ref 36.0–46.0)
HCT: 24 % — ABNORMAL LOW (ref 36.0–46.0)
HCT: 21 % — ABNORMAL LOW (ref 36.0–46.0)
HCT: 26 % — ABNORMAL LOW (ref 36.0–46.0)
HCT: 28 % — ABNORMAL LOW (ref 36.0–46.0)
HEMOGLOBIN: 8.8 g/dL — AB (ref 12.0–15.0)
HEMOGLOBIN: 7.1 g/dL — AB (ref 12.0–15.0)
HEMOGLOBIN: 8.8 g/dL — AB (ref 12.0–15.0)
Hemoglobin: 10.2 g/dL — ABNORMAL LOW (ref 12.0–15.0)
Hemoglobin: 8.2 g/dL — ABNORMAL LOW (ref 12.0–15.0)
Hemoglobin: 9.5 g/dL — ABNORMAL LOW (ref 12.0–15.0)
POTASSIUM: 2.2 mmol/L — AB (ref 3.5–5.1)
POTASSIUM: 2.1 mmol/L — AB (ref 3.5–5.1)
POTASSIUM: 3.9 mmol/L (ref 3.5–5.1)
POTASSIUM: 3.7 mmol/L (ref 3.5–5.1)
Potassium: 3.2 mmol/L — ABNORMAL LOW (ref 3.5–5.1)
Potassium: 2.9 mmol/L — ABNORMAL LOW (ref 3.5–5.1)
SODIUM: 138 mmol/L (ref 135–145)
Sodium: 137 mmol/L (ref 135–145)
Sodium: 139 mmol/L (ref 135–145)
Sodium: 138 mmol/L (ref 135–145)
Sodium: 137 mmol/L (ref 135–145)
Sodium: 139 mmol/L (ref 135–145)
TCO2: 33 mmol/L (ref 0–100)
TCO2: 35 mmol/L (ref 0–100)
TCO2: 36 mmol/L (ref 0–100)
TCO2: 36 mmol/L (ref 0–100)
TCO2: 30 mmol/L (ref 0–100)
TCO2: 26 mmol/L (ref 0–100)

## 2016-06-25 LAB — APTT: APTT: 39 s — AB (ref 24–36)

## 2016-06-25 LAB — CARBOXYHEMOGLOBIN
CARBOXYHEMOGLOBIN: 1.6 % — AB (ref 0.5–1.5)
METHEMOGLOBIN: 1 % (ref 0.0–1.5)
O2 SAT: 77.6 %
TOTAL HEMOGLOBIN: 8.8 g/dL — AB (ref 12.0–16.0)

## 2016-06-25 LAB — POCT I-STAT 4, (NA,K, GLUC, HGB,HCT)
Glucose, Bld: 71 mg/dL (ref 65–99)
HCT: 31 % — ABNORMAL LOW (ref 36.0–46.0)
HEMOGLOBIN: 10.5 g/dL — AB (ref 12.0–15.0)
Potassium: 2.7 mmol/L — CL (ref 3.5–5.1)
SODIUM: 142 mmol/L (ref 135–145)

## 2016-06-25 LAB — POCT I-STAT 3, ART BLOOD GAS (G3+)
ACID-BASE EXCESS: 15 mmol/L — AB (ref 0.0–2.0)
ACID-BASE EXCESS: 12 mmol/L — AB (ref 0.0–2.0)
ACID-BASE EXCESS: 6 mmol/L — AB (ref 0.0–2.0)
BICARBONATE: 38.1 mmol/L — AB (ref 20.0–28.0)
Bicarbonate: 36.7 mmol/L — ABNORMAL HIGH (ref 20.0–28.0)
Bicarbonate: 31.4 mmol/L — ABNORMAL HIGH (ref 20.0–28.0)
O2 SAT: 99 %
O2 Saturation: 100 %
O2 Saturation: 100 %
PH ART: 7.46 — AB (ref 7.350–7.450)
PH ART: 7.405 (ref 7.350–7.450)
Patient temperature: 98.6
TCO2: 39 mmol/L (ref 0–100)
TCO2: 38 mmol/L (ref 0–100)
TCO2: 33 mmol/L (ref 0–100)
pCO2 arterial: 37.2 mmHg (ref 32.0–48.0)
pCO2 arterial: 51.6 mmHg — ABNORMAL HIGH (ref 32.0–48.0)
pCO2 arterial: 50.1 mmHg — ABNORMAL HIGH (ref 32.0–48.0)
pH, Arterial: 7.618 (ref 7.350–7.450)
pO2, Arterial: 402 mmHg — ABNORMAL HIGH (ref 83.0–108.0)
pO2, Arterial: 317 mmHg — ABNORMAL HIGH (ref 83.0–108.0)
pO2, Arterial: 145 mmHg — ABNORMAL HIGH (ref 83.0–108.0)

## 2016-06-25 LAB — HEMOGLOBIN AND HEMATOCRIT, BLOOD
HCT: 22.7 % — ABNORMAL LOW (ref 36.0–46.0)
Hemoglobin: 7.4 g/dL — ABNORMAL LOW (ref 12.0–15.0)

## 2016-06-25 LAB — PLATELET COUNT: Platelets: 139 10*3/uL — ABNORMAL LOW (ref 150–400)

## 2016-06-25 LAB — GLUCOSE, CAPILLARY
GLUCOSE-CAPILLARY: 102 mg/dL — AB (ref 65–99)
GLUCOSE-CAPILLARY: 118 mg/dL — AB (ref 65–99)
GLUCOSE-CAPILLARY: 146 mg/dL — AB (ref 65–99)
GLUCOSE-CAPILLARY: 133 mg/dL — AB (ref 65–99)
Glucose-Capillary: 94 mg/dL (ref 65–99)
Glucose-Capillary: 102 mg/dL — ABNORMAL HIGH (ref 65–99)
Glucose-Capillary: 130 mg/dL — ABNORMAL HIGH (ref 65–99)
Glucose-Capillary: 133 mg/dL — ABNORMAL HIGH (ref 65–99)

## 2016-06-25 LAB — PREPARE RBC (CROSSMATCH)

## 2016-06-25 LAB — BASIC METABOLIC PANEL
Anion gap: 9 (ref 5–15)
BUN: 18 mg/dL (ref 6–20)
CHLORIDE: 102 mmol/L (ref 101–111)
CO2: 25 mmol/L (ref 22–32)
Calcium: 8.3 mg/dL — ABNORMAL LOW (ref 8.9–10.3)
Creatinine, Ser: 1.19 mg/dL — ABNORMAL HIGH (ref 0.44–1.00)
GFR calc Af Amer: 50 mL/min — ABNORMAL LOW (ref >60)
GFR calc non Af Amer: 43 mL/min — ABNORMAL LOW (ref >60)
Glucose, Bld: 143 mg/dL — ABNORMAL HIGH (ref 65–99)
POTASSIUM: 3.6 mmol/L (ref 3.5–5.1)
SODIUM: 136 mmol/L (ref 135–145)

## 2016-06-25 LAB — PROTIME-INR
INR: 1.38
PROTHROMBIN TIME: 17.1 s — AB (ref 11.4–15.2)

## 2016-06-25 LAB — MAGNESIUM: MAGNESIUM: 2.6 mg/dL — AB (ref 1.7–2.4)

## 2016-06-25 SURGERY — REPLACEMENT, MITRAL VALVE
Anesthesia: General | Site: Chest

## 2016-06-25 MED ORDER — ONDANSETRON HCL 4 MG/2ML IJ SOLN
4.0000 mg | Freq: Four times a day (QID) | INTRAMUSCULAR | Status: DC | PRN
  Administered 2016-06-26 – 2016-07-18 (×5): 4 mg via INTRAVENOUS
  Filled 2016-06-25 (×5): qty 2

## 2016-06-25 MED ORDER — ACETAMINOPHEN 500 MG PO TABS
1000.0000 mg | ORAL_TABLET | Freq: Four times a day (QID) | ORAL | Status: AC
  Administered 2016-06-26 – 2016-06-30 (×15): 1000 mg via ORAL
  Filled 2016-06-25 (×16): qty 2

## 2016-06-25 MED ORDER — METOPROLOL TARTRATE 12.5 MG HALF TABLET: 12.5000 mg | ORAL_TABLET | Freq: Two times a day (BID) | ORAL | Status: DC

## 2016-06-25 MED ORDER — BISACODYL 5 MG PO TBEC
10.0000 mg | DELAYED_RELEASE_TABLET | Freq: Every day | ORAL | Status: DC
  Administered 2016-06-26 – 2016-07-20 (×12): 10 mg via ORAL
  Filled 2016-06-25 (×12): qty 2

## 2016-06-25 MED ORDER — LEVOTHYROXINE SODIUM 50 MCG PO TABS
50.0000 ug | ORAL_TABLET | Freq: Every day | ORAL | Status: DC
  Administered 2016-06-26 – 2016-07-06 (×10): 50 ug via ORAL
  Filled 2016-06-25 (×11): qty 1

## 2016-06-25 MED ORDER — FENTANYL CITRATE (PF) 250 MCG/5ML IJ SOLN
INTRAMUSCULAR | Status: DC | PRN
  Administered 2016-06-25 (×2): 250 ug via INTRAVENOUS
  Administered 2016-06-25: 100 ug via INTRAVENOUS
  Administered 2016-06-25: 250 ug via INTRAVENOUS
  Administered 2016-06-25: 150 ug via INTRAVENOUS

## 2016-06-25 MED ORDER — VANCOMYCIN HCL IN DEXTROSE 1-5 GM/200ML-% IV SOLN
1000.0000 mg | Freq: Once | INTRAVENOUS | Status: AC
  Administered 2016-06-25: 1000 mg via INTRAVENOUS
  Filled 2016-06-25: qty 200

## 2016-06-25 MED ORDER — PHENYLEPHRINE 40 MCG/ML (10ML) SYRINGE FOR IV PUSH (FOR BLOOD PRESSURE SUPPORT)
PREFILLED_SYRINGE | INTRAVENOUS | Status: AC
  Filled 2016-06-25: qty 10

## 2016-06-25 MED ORDER — SODIUM CHLORIDE 0.9 % IV SOLN
0.5000 g/h | INTRAVENOUS | Status: DC
  Filled 2016-06-25: qty 20

## 2016-06-25 MED ORDER — 0.9 % SODIUM CHLORIDE (POUR BTL) OPTIME
TOPICAL | Status: DC | PRN
  Administered 2016-06-25: 5000 mL

## 2016-06-25 MED ORDER — DOCUSATE SODIUM 100 MG PO CAPS
200.0000 mg | ORAL_CAPSULE | Freq: Every day | ORAL | Status: DC
  Administered 2016-06-26 – 2016-07-06 (×8): 200 mg via ORAL
  Filled 2016-06-25 (×8): qty 2

## 2016-06-25 MED ORDER — PHENYLEPHRINE HCL 10 MG/ML IJ SOLN
0.0000 ug/min | INTRAVENOUS | Status: DC
  Administered 2016-06-26: 75 ug/min via INTRAVENOUS
  Administered 2016-06-28: 20 ug/min via INTRAVENOUS
  Filled 2016-06-25 (×8): qty 2

## 2016-06-25 MED ORDER — VANCOMYCIN HCL 1000 MG IV SOLR
INTRAVENOUS | Status: DC | PRN
  Administered 2016-06-25: 1000 mL

## 2016-06-25 MED ORDER — LACTATED RINGERS IV SOLN
INTRAVENOUS | Status: DC | PRN
  Administered 2016-06-25 (×2): via INTRAVENOUS

## 2016-06-25 MED ORDER — DEXTROSE 5 % IV SOLN
INTRAVENOUS | Status: DC | PRN
  Administered 2016-06-25: 20 ug/min via INTRAVENOUS

## 2016-06-25 MED ORDER — SODIUM CHLORIDE 0.9 % IV SOLN
250.0000 mL | INTRAVENOUS | Status: DC
  Administered 2016-07-03: 250 mL via INTRAVENOUS

## 2016-06-25 MED ORDER — SODIUM CHLORIDE 0.9 % IV SOLN
INTRAVENOUS | Status: DC
  Administered 2016-06-25: 15:00:00 via INTRAVENOUS

## 2016-06-25 MED ORDER — POTASSIUM CHLORIDE 10 MEQ/50ML IV SOLN
10.0000 meq | INTRAVENOUS | Status: AC
  Administered 2016-06-25 (×3): 10 meq via INTRAVENOUS
  Filled 2016-06-25 (×3): qty 50

## 2016-06-25 MED ORDER — ROCURONIUM BROMIDE 10 MG/ML (PF) SYRINGE
PREFILLED_SYRINGE | INTRAVENOUS | Status: AC
  Filled 2016-06-25: qty 20

## 2016-06-25 MED ORDER — LACTATED RINGERS IV SOLN
INTRAVENOUS | Status: DC | PRN
  Administered 2016-06-25: 07:00:00 via INTRAVENOUS

## 2016-06-25 MED ORDER — LACTATED RINGERS IV SOLN: 500.0000 mL | Freq: Once | INTRAVENOUS | Status: DC | PRN

## 2016-06-25 MED ORDER — ACETAMINOPHEN 650 MG RE SUPP
650.0000 mg | Freq: Once | RECTAL | Status: AC
  Administered 2016-06-25: 650 mg via RECTAL

## 2016-06-25 MED ORDER — METOPROLOL TARTRATE 5 MG/5ML IV SOLN: 2.5000 mg | INTRAVENOUS | Status: DC | PRN

## 2016-06-25 MED ORDER — MAGNESIUM SULFATE 4 GM/100ML IV SOLN
4.0000 g | Freq: Once | INTRAVENOUS | Status: AC
  Administered 2016-06-25: 4 g via INTRAVENOUS
  Filled 2016-06-25: qty 100

## 2016-06-25 MED ORDER — SODIUM CHLORIDE 0.9 % IR SOLN
Status: DC | PRN
  Administered 2016-06-25: 3000 mL

## 2016-06-25 MED ORDER — SODIUM CHLORIDE 0.9 % IV SOLN
INTRAVENOUS | Status: DC | PRN
  Administered 2016-06-25: 1 [IU]/h via INTRAVENOUS

## 2016-06-25 MED ORDER — POTASSIUM CHLORIDE 10 MEQ/50ML IV SOLN
10.0000 meq | INTRAVENOUS | Status: AC
  Administered 2016-06-25 (×3): 10 meq via INTRAVENOUS

## 2016-06-25 MED ORDER — ALBUMIN HUMAN 5 % IV SOLN
12.5000 g | Freq: Once | INTRAVENOUS | Status: AC
  Administered 2016-06-25: 12.5 g via INTRAVENOUS

## 2016-06-25 MED ORDER — CHLORHEXIDINE GLUCONATE 4 % EX LIQD: 30.0000 mL | CUTANEOUS | Status: DC

## 2016-06-25 MED ORDER — SODIUM CHLORIDE 0.45 % IV SOLN: INTRAVENOUS | Status: DC | PRN

## 2016-06-25 MED ORDER — SODIUM CHLORIDE 0.9 % IJ SOLN
OROMUCOSAL | Status: DC | PRN
  Administered 2016-06-25 (×5): 4 mL via TOPICAL

## 2016-06-25 MED ORDER — HEPARIN SODIUM (PORCINE) 1000 UNIT/ML IJ SOLN
INTRAMUSCULAR | Status: DC | PRN
  Administered 2016-06-25: 35 mL via INTRAVENOUS

## 2016-06-25 MED ORDER — MIDAZOLAM HCL 5 MG/5ML IJ SOLN
INTRAMUSCULAR | Status: DC | PRN
  Administered 2016-06-25 (×2): 2 mg via INTRAVENOUS
  Administered 2016-06-25: 3 mg via INTRAVENOUS

## 2016-06-25 MED ORDER — LACTATED RINGERS IV SOLN
INTRAVENOUS | Status: DC
  Administered 2016-06-25: 15:00:00 via INTRAVENOUS

## 2016-06-25 MED ORDER — MILRINONE LACTATE IN DEXTROSE 20-5 MG/100ML-% IV SOLN
0.2000 ug/kg/min | INTRAVENOUS | Status: DC
Start: 2016-06-25 — End: 2016-06-25

## 2016-06-25 MED ORDER — LIDOCAINE HCL (CARDIAC) 20 MG/ML IV SOLN
INTRAVENOUS | Status: DC | PRN
  Administered 2016-06-25: 100 mg via INTRAVENOUS

## 2016-06-25 MED ORDER — ROCURONIUM BROMIDE 100 MG/10ML IV SOLN
INTRAVENOUS | Status: DC | PRN
  Administered 2016-06-25 (×2): 100 mg via INTRAVENOUS

## 2016-06-25 MED ORDER — MILRINONE LACTATE IN DEXTROSE 20-5 MG/100ML-% IV SOLN
0.0000 ug/kg/min | INTRAVENOUS | Status: DC
  Administered 2016-06-25 – 2016-06-26 (×2): 0.2 ug/kg/min via INTRAVENOUS
  Filled 2016-06-25 (×2): qty 100

## 2016-06-25 MED ORDER — MIDAZOLAM HCL 10 MG/2ML IJ SOLN
INTRAMUSCULAR | Status: AC
  Filled 2016-06-25: qty 2

## 2016-06-25 MED ORDER — CHLORHEXIDINE GLUCONATE 0.12 % MT SOLN
15.0000 mL | Freq: Once | OROMUCOSAL | Status: AC
  Administered 2016-06-25: 15 mL via OROMUCOSAL

## 2016-06-25 MED ORDER — ACETAMINOPHEN 160 MG/5ML PO SOLN: 1000.0000 mg | Freq: Four times a day (QID) | ORAL | Status: DC

## 2016-06-25 MED ORDER — MIDAZOLAM HCL 2 MG/2ML IJ SOLN: 2.0000 mg | INTRAMUSCULAR | Status: DC | PRN

## 2016-06-25 MED ORDER — DOPAMINE-DEXTROSE 3.2-5 MG/ML-% IV SOLN
0.0000 ug/kg/min | INTRAVENOUS | Status: DC
  Administered 2016-06-25 – 2016-06-29 (×4): 3 ug/kg/min via INTRAVENOUS
  Filled 2016-06-25 (×3): qty 250

## 2016-06-25 MED ORDER — ACETAMINOPHEN 160 MG/5ML PO SOLN: 650.0000 mg | Freq: Once | ORAL | Status: AC

## 2016-06-25 MED ORDER — CHLORHEXIDINE GLUCONATE 0.12 % MT SOLN
OROMUCOSAL | Status: AC
  Filled 2016-06-25: qty 15

## 2016-06-25 MED ORDER — CHLORHEXIDINE GLUCONATE 0.12 % MT SOLN
15.0000 mL | OROMUCOSAL | Status: AC
  Administered 2016-06-25: 15 mL via OROMUCOSAL

## 2016-06-25 MED ORDER — ASPIRIN EC 325 MG PO TBEC
325.0000 mg | DELAYED_RELEASE_TABLET | Freq: Every day | ORAL | Status: DC
  Administered 2016-06-26: 325 mg via ORAL
  Filled 2016-06-25: qty 1

## 2016-06-25 MED ORDER — NITROGLYCERIN IN D5W 200-5 MCG/ML-% IV SOLN: 0.0000 ug/min | INTRAVENOUS | Status: DC

## 2016-06-25 MED ORDER — ASPIRIN 81 MG PO CHEW: 324.0000 mg | CHEWABLE_TABLET | Freq: Every day | ORAL | Status: DC

## 2016-06-25 MED ORDER — MORPHINE SULFATE (PF) 2 MG/ML IV SOLN: 1.0000 mg | INTRAVENOUS | Status: DC | PRN

## 2016-06-25 MED ORDER — FENTANYL CITRATE (PF) 250 MCG/5ML IJ SOLN
INTRAMUSCULAR | Status: AC
  Filled 2016-06-25: qty 20

## 2016-06-25 MED ORDER — ETOMIDATE 2 MG/ML IV SOLN
INTRAVENOUS | Status: DC | PRN
  Administered 2016-06-25: 16 mg via INTRAVENOUS

## 2016-06-25 MED ORDER — METOPROLOL TARTRATE 25 MG/10 ML ORAL SUSPENSION: 12.5000 mg | Freq: Two times a day (BID) | ORAL | Status: DC

## 2016-06-25 MED ORDER — METOPROLOL TARTRATE 12.5 MG HALF TABLET
12.5000 mg | ORAL_TABLET | Freq: Once | ORAL | Status: AC
  Administered 2016-06-25: 12.5 mg via ORAL
  Filled 2016-06-25: qty 1

## 2016-06-25 MED ORDER — DEXMEDETOMIDINE HCL IN NACL 200 MCG/50ML IV SOLN
0.0000 ug/kg/h | INTRAVENOUS | Status: DC
  Filled 2016-06-25 (×2): qty 50

## 2016-06-25 MED ORDER — LIDOCAINE 2% (20 MG/ML) 5 ML SYRINGE
INTRAMUSCULAR | Status: AC
  Filled 2016-06-25: qty 5

## 2016-06-25 MED ORDER — DEXTROSE 5 % IV SOLN
1.5000 g | Freq: Two times a day (BID) | INTRAVENOUS | Status: AC
  Administered 2016-06-25 – 2016-06-27 (×4): 1.5 g via INTRAVENOUS
  Filled 2016-06-25 (×4): qty 1.5

## 2016-06-25 MED ORDER — ALBUMIN HUMAN 5 % IV SOLN
250.0000 mL | INTRAVENOUS | Status: AC | PRN
  Administered 2016-06-25 (×4): 250 mL via INTRAVENOUS
  Filled 2016-06-25 (×3): qty 250

## 2016-06-25 MED ORDER — OXYCODONE HCL 5 MG PO TABS
5.0000 mg | ORAL_TABLET | ORAL | Status: DC | PRN
  Administered 2016-06-26: 5 mg via ORAL
  Administered 2016-06-27: 10 mg via ORAL
  Administered 2016-06-28 – 2016-07-06 (×7): 5 mg via ORAL
  Filled 2016-06-25: qty 2
  Filled 2016-06-25 (×7): qty 1

## 2016-06-25 MED ORDER — ALBUMIN HUMAN 5 % IV SOLN
INTRAVENOUS | Status: DC | PRN
  Administered 2016-06-25: 13:00:00 via INTRAVENOUS

## 2016-06-25 MED ORDER — PROPOFOL 10 MG/ML IV BOLUS
INTRAVENOUS | Status: AC
Start: 2016-06-25 — End: 2016-06-25
  Filled 2016-06-25: qty 40

## 2016-06-25 MED ORDER — SODIUM CHLORIDE 0.9 % IV SOLN
INTRAVENOUS | Status: DC | PRN
  Administered 2016-06-25: 0.2 ug/kg/h via INTRAVENOUS

## 2016-06-25 MED ORDER — GLYCOPYRROLATE 0.2 MG/ML IV SOSY
PREFILLED_SYRINGE | INTRAVENOUS | Status: AC
  Filled 2016-06-25: qty 3

## 2016-06-25 MED ORDER — SODIUM CHLORIDE 0.9% FLUSH
3.0000 mL | INTRAVENOUS | Status: DC | PRN
  Administered 2016-07-21: 3 mL via INTRAVENOUS
  Filled 2016-06-25: qty 3

## 2016-06-25 MED ORDER — PANTOPRAZOLE SODIUM 40 MG PO TBEC
40.0000 mg | DELAYED_RELEASE_TABLET | Freq: Every day | ORAL | Status: DC
  Administered 2016-06-27 – 2016-07-06 (×9): 40 mg via ORAL
  Filled 2016-06-25 (×9): qty 1

## 2016-06-25 MED ORDER — SODIUM CHLORIDE 0.9 % IV SOLN
INTRAVENOUS | Status: DC | PRN
  Administered 2016-06-25: 5 g/h via INTRAVENOUS

## 2016-06-25 MED ORDER — SODIUM CHLORIDE 0.9% FLUSH
3.0000 mL | Freq: Two times a day (BID) | INTRAVENOUS | Status: DC
  Administered 2016-06-27: 3 mL via INTRAVENOUS
  Administered 2016-06-27 – 2016-06-28 (×2): 10 mL via INTRAVENOUS
  Administered 2016-06-29 – 2016-06-30 (×2): 3 mL via INTRAVENOUS
  Administered 2016-06-30: 10 mL via INTRAVENOUS
  Administered 2016-07-01: 3 mL via INTRAVENOUS
  Administered 2016-07-02: 10 mL via INTRAVENOUS
  Administered 2016-07-04 – 2016-07-09 (×9): 3 mL via INTRAVENOUS
  Administered 2016-07-10: 10 mL via INTRAVENOUS
  Administered 2016-07-10 – 2016-07-11 (×2): 3 mL via INTRAVENOUS
  Administered 2016-07-12: 10 mL via INTRAVENOUS
  Administered 2016-07-19 – 2016-07-20 (×2): 3 mL via INTRAVENOUS

## 2016-06-25 MED ORDER — SODIUM CHLORIDE 0.9 % IV SOLN
INTRAVENOUS | Status: DC
  Administered 2016-06-25: 3.7 [IU]/h via INTRAVENOUS
  Filled 2016-06-25 (×3): qty 2.5

## 2016-06-25 MED ORDER — MORPHINE SULFATE (PF) 2 MG/ML IV SOLN
1.0000 mg | INTRAVENOUS | Status: DC | PRN
  Administered 2016-06-25 – 2016-06-28 (×6): 2 mg via INTRAVENOUS
  Filled 2016-06-25 (×7): qty 1

## 2016-06-25 MED ORDER — PROTAMINE SULFATE 10 MG/ML IV SOLN
INTRAVENOUS | Status: DC | PRN
  Administered 2016-06-25 (×6): 50 mg via INTRAVENOUS

## 2016-06-25 MED ORDER — TRAMADOL HCL 50 MG PO TABS
50.0000 mg | ORAL_TABLET | ORAL | Status: DC | PRN
  Administered 2016-06-26: 50 mg via ORAL
  Filled 2016-06-25: qty 1

## 2016-06-25 MED ORDER — BISACODYL 10 MG RE SUPP
10.0000 mg | Freq: Every day | RECTAL | Status: DC
  Administered 2016-07-09 – 2016-07-15 (×2): 10 mg via RECTAL
  Filled 2016-06-25 (×4): qty 1

## 2016-06-25 MED ORDER — FAMOTIDINE IN NACL 20-0.9 MG/50ML-% IV SOLN
20.0000 mg | Freq: Two times a day (BID) | INTRAVENOUS | Status: AC
  Administered 2016-06-25 – 2016-06-26 (×2): 20 mg via INTRAVENOUS
  Filled 2016-06-25: qty 50

## 2016-06-25 MED ORDER — INSULIN REGULAR BOLUS VIA INFUSION
0.0000 [IU] | Freq: Three times a day (TID) | INTRAVENOUS | Status: DC
  Filled 2016-06-25: qty 10

## 2016-06-25 MED FILL — Calcium Chloride Inj 10%: INTRAVENOUS | Qty: 10 | Status: AC

## 2016-06-25 MED FILL — Potassium Chloride Inj 2 mEq/ML: INTRAVENOUS | Qty: 40 | Status: AC

## 2016-06-25 MED FILL — Sodium Chloride IV Soln 0.9%: INTRAVENOUS | Qty: 2000 | Status: AC

## 2016-06-25 MED FILL — Heparin Sodium (Porcine) Inj 1000 Unit/ML: INTRAMUSCULAR | Qty: 30 | Status: AC

## 2016-06-25 MED FILL — Magnesium Sulfate Inj 50%: INTRAMUSCULAR | Qty: 10 | Status: AC

## 2016-06-25 MED FILL — Heparin Sodium (Porcine) Inj 1000 Unit/ML: INTRAMUSCULAR | Qty: 10 | Status: AC

## 2016-06-25 MED FILL — Mannitol IV Soln 20%: INTRAVENOUS | Qty: 500 | Status: AC

## 2016-06-25 MED FILL — Lidocaine HCl IV Inj 20 MG/ML: INTRAVENOUS | Qty: 5 | Status: AC

## 2016-06-25 MED FILL — Electrolyte-R (PH 7.4) Solution: INTRAVENOUS | Qty: 4000 | Status: AC

## 2016-06-25 SURGICAL SUPPLY — 117 items
ADAPTER CARDIO PERF ANTE/RETRO (ADAPTER) ×4 IMPLANT
APPLICATOR COTTON TIP 6IN STRL (MISCELLANEOUS) IMPLANT
ATRICLIP EXCLUSION 40 STD HAND (Clip) ×4 IMPLANT
ATTRACTOMAT 16X20 MAGNETIC DRP (DRAPES) ×4 IMPLANT
BAG DECANTER FOR FLEXI CONT (MISCELLANEOUS) ×8 IMPLANT
BLADE STERNUM SYSTEM 6 (BLADE) ×8 IMPLANT
BLADE SURG 11 STRL SS (BLADE) ×8 IMPLANT
CANISTER SUCTION 2500CC (MISCELLANEOUS) ×8 IMPLANT
CANN PRFSN 3/8X14X24FR PCFC (MISCELLANEOUS)
CANN PRFSN 3/8XCNCT ST RT ANG (MISCELLANEOUS)
CANNULA AORTIC ROOT 9FR (CANNULA) ×4 IMPLANT
CANNULA EZ GLIDE AORTIC 21FR (CANNULA) ×12 IMPLANT
CANNULA FEM VENOUS REMOTE 22FR (CANNULA) ×4 IMPLANT
CANNULA GUNDRY RCSP 15FR (MISCELLANEOUS) ×4 IMPLANT
CANNULA PRFSN 3/8X14X24FR PCFC (MISCELLANEOUS) IMPLANT
CANNULA PRFSN 3/8XCNCT RT ANG (MISCELLANEOUS) IMPLANT
CANNULA VEN MTL TIP RT (MISCELLANEOUS)
CANNULA VENNOUS METAL TIP 20FR (CANNULA) ×4 IMPLANT
CARDIOBLATE CARDIAC ABLATION (MISCELLANEOUS)
CATH ROBINSON RED A/P 18FR (CATHETERS) ×4 IMPLANT
CATH THORACIC 28FR RT ANG (CATHETERS) IMPLANT
CATH THORACIC 36FR (CATHETERS) IMPLANT
CLAMP ISOLATOR SYNERGY LG (MISCELLANEOUS) ×4 IMPLANT
CLIP FOGARTY SPRING 6M (CLIP) IMPLANT
CONN 1/2X1/2X1/2  BEN (MISCELLANEOUS) ×4
CONN 1/2X1/2X1/2 BEN (MISCELLANEOUS) ×4 IMPLANT
CONN 3/8X1/2 ST GISH (MISCELLANEOUS) ×20 IMPLANT
CONN ST 1/4X3/8  BEN (MISCELLANEOUS) ×6
CONN ST 1/4X3/8 BEN (MISCELLANEOUS) ×6 IMPLANT
CONT SPEC 4OZ CLIKSEAL STRL BL (MISCELLANEOUS) ×4 IMPLANT
COVER PROBE W GEL 5X96 (DRAPES) ×4 IMPLANT
COVER SURGICAL LIGHT HANDLE (MISCELLANEOUS) ×12 IMPLANT
CRADLE DONUT ADULT HEAD (MISCELLANEOUS) ×8 IMPLANT
DEVICE CARDIOBLATE CARDIAC ABL (MISCELLANEOUS) IMPLANT
DEVICE SUT CK QUICK LOAD MINI (Prosthesis & Implant Heart) ×15 IMPLANT
DRAIN CHANNEL 32F RND 10.7 FF (WOUND CARE) ×12 IMPLANT
DRAPE CARDIOVASCULAR INCISE (DRAPES) ×2
DRAPE INCISE IOBAN 66X45 STRL (DRAPES) ×12 IMPLANT
DRAPE SLUSH/WARMER DISC (DRAPES) ×4 IMPLANT
DRAPE SRG 135X102X78XABS (DRAPES) ×2 IMPLANT
DRSG AQUACEL AG ADV 3.5X14 (GAUZE/BANDAGES/DRESSINGS) ×4 IMPLANT
DRSG COVADERM 4X14 (GAUZE/BANDAGES/DRESSINGS) ×8 IMPLANT
ELECT BLADE 6.5 EXT (BLADE) ×4 IMPLANT
ELECT REM PT RETURN 9FT ADLT (ELECTROSURGICAL) ×16
ELECTRODE REM PT RTRN 9FT ADLT (ELECTROSURGICAL) ×8 IMPLANT
FELT TEFLON 1X6 (MISCELLANEOUS) ×16 IMPLANT
GAUZE SPONGE 4X4 12PLY STRL (GAUZE/BANDAGES/DRESSINGS) ×16 IMPLANT
GLOVE BIO SURGEON STRL SZ 6 (GLOVE) IMPLANT
GLOVE BIO SURGEON STRL SZ 6.5 (GLOVE) IMPLANT
GLOVE BIO SURGEON STRL SZ7 (GLOVE) IMPLANT
GLOVE BIO SURGEON STRL SZ7.5 (GLOVE) IMPLANT
GLOVE BIO SURGEONS STRL SZ 6.5 (GLOVE)
GLOVE ORTHO TXT STRL SZ7.5 (GLOVE) ×8 IMPLANT
GOWN STRL REUS W/ TWL LRG LVL3 (GOWN DISPOSABLE) ×16 IMPLANT
GOWN STRL REUS W/TWL LRG LVL3 (GOWN DISPOSABLE) ×16
HEMOSTAT POWDER SURGIFOAM 1G (HEMOSTASIS) ×20 IMPLANT
INSERT FOGARTY XLG (MISCELLANEOUS) ×4 IMPLANT
KIT BASIN OR (CUSTOM PROCEDURE TRAY) ×8 IMPLANT
KIT COMBO MINI 4X17COR-KNOT (Prosthesis & Implant Heart) ×1 IMPLANT
KIT DILATOR VASC 18G NDL (KITS) ×4 IMPLANT
KIT DRAINAGE VACCUM ASSIST (KITS) ×4 IMPLANT
KIT ROOM TURNOVER OR (KITS) ×8 IMPLANT
KIT SUCTION CATH 14FR (SUCTIONS) ×24 IMPLANT
KIT SUT CK MINI COMBO 4X17 (Prosthesis & Implant Heart) ×3 IMPLANT
LINE VENT (MISCELLANEOUS) ×4 IMPLANT
LOAD QUICK .035MM CK MINI (Prosthesis & Implant Heart) ×5 IMPLANT
LOOP VESSEL SUPERMAXI WHITE (MISCELLANEOUS) ×8 IMPLANT
MARKER GRAFT CORONARY BYPASS (MISCELLANEOUS) IMPLANT
NS IRRIG 1000ML POUR BTL (IV SOLUTION) ×44 IMPLANT
PACK OPEN HEART (CUSTOM PROCEDURE TRAY) ×8 IMPLANT
PAD ARMBOARD 7.5X6 YLW CONV (MISCELLANEOUS) ×16 IMPLANT
PROBE CRYO2-ABLATION MALLABLE (MISCELLANEOUS) ×4 IMPLANT
RING TRICUSPID T26 (Prosthesis & Implant Heart) ×4 IMPLANT
SET CARDIOPLEGIA MPS 5001102 (MISCELLANEOUS) ×4 IMPLANT
SET IRRIG TUBING LAPAROSCOPIC (IRRIGATION / IRRIGATOR) ×4 IMPLANT
SPONGE GAUZE 4X4 12PLY STER LF (GAUZE/BANDAGES/DRESSINGS) ×4 IMPLANT
SPONGE LAP 4X18 X RAY DECT (DISPOSABLE) ×4 IMPLANT
SUCKER INTRACARDIAC WEIGHTED (SUCKER) ×8 IMPLANT
SUT BONE WAX W31G (SUTURE) ×4 IMPLANT
SUT ETHIBOND (SUTURE) ×12 IMPLANT
SUT ETHIBOND 2 0 SH (SUTURE) ×24 IMPLANT
SUT ETHIBOND 2 0 SH 36X2 (SUTURE) ×8 IMPLANT
SUT ETHIBOND 2 0 V4 (SUTURE) IMPLANT
SUT ETHIBOND 2 0V4 GREEN (SUTURE) IMPLANT
SUT ETHIBOND 2-0 RB-1 WHT (SUTURE) ×8 IMPLANT
SUT ETHIBOND 4 0 TF (SUTURE) IMPLANT
SUT ETHIBOND 5 0 C 1 30 (SUTURE) ×4 IMPLANT
SUT ETHIBOND X763 2 0 SH 1 (SUTURE) ×12 IMPLANT
SUT MNCRL AB 3-0 PS2 18 (SUTURE) ×8 IMPLANT
SUT PDS AB 1 CTX 36 (SUTURE) ×8 IMPLANT
SUT PROLENE 3 0 SH 1 (SUTURE) ×8 IMPLANT
SUT PROLENE 3 0 SH 48 (SUTURE) ×20 IMPLANT
SUT PROLENE 3 0 SH DA (SUTURE) ×20 IMPLANT
SUT PROLENE 3 0 SH1 36 (SUTURE) ×28 IMPLANT
SUT PROLENE 4 0 RB 1 (SUTURE) ×12
SUT PROLENE 4 0 SH DA (SUTURE) ×20 IMPLANT
SUT PROLENE 4-0 RB1 .5 CRCL 36 (SUTURE) ×12 IMPLANT
SUT SILK  1 MH (SUTURE) ×8
SUT SILK 1 MH (SUTURE) ×8 IMPLANT
SUT STEEL 6MS V (SUTURE) ×8 IMPLANT
SUT STEEL STERNAL CCS#1 18IN (SUTURE) IMPLANT
SUT STEEL SZ 6 DBL 3X14 BALL (SUTURE) ×4 IMPLANT
SUT VIC AB 1 CTX 18 (SUTURE) ×4 IMPLANT
SUT VIC AB 1 CTX 36 (SUTURE) ×2
SUT VIC AB 1 CTX36XBRD ANBCTR (SUTURE) ×2 IMPLANT
SUT VIC AB 2-0 CTX 27 (SUTURE) IMPLANT
SYSTEM SAHARA CHEST DRAIN ATS (WOUND CARE) ×8 IMPLANT
TAPE CLOTH SURG 4X10 WHT LF (GAUZE/BANDAGES/DRESSINGS) ×4 IMPLANT
TAPE PAPER 2X10 WHT MICROPORE (GAUZE/BANDAGES/DRESSINGS) ×4 IMPLANT
TOWEL OR 17X24 6PK STRL BLUE (TOWEL DISPOSABLE) ×12 IMPLANT
TOWEL OR 17X26 10 PK STRL BLUE (TOWEL DISPOSABLE) ×12 IMPLANT
TRAY FOLEY IC TEMP SENS 14FR (CATHETERS) ×4 IMPLANT
TRAY FOLEY IC TEMP SENS 16FR (CATHETERS) ×4 IMPLANT
TUBING INSUFFLATION 10FT LAP (TUBING) ×4 IMPLANT
UNDERPAD 30X30 (UNDERPADS AND DIAPERS) ×8 IMPLANT
VALVE MAGNA MITRAL 29MM (Prosthesis & Implant Heart) ×4 IMPLANT
WATER STERILE IRR 1000ML POUR (IV SOLUTION) ×16 IMPLANT

## 2016-06-25 NOTE — Op Note (Signed)
CARDIOTHORACIC SURGERY OPERATIVE NOTE  Date of Procedure:  06/23/2016  Preoperative Diagnosis:   Rheumatic Heart Valve Disease  Severe Mitral Stenosis  Mild Mitral Regurgitation  Severe Tricuspid Regurgitation  Chronic Persistent Atrial Fibrillation  Postoperative Diagnosis: Same  Procedure:   Mitral Valve Replacement   Edwards Magna Mitral Bovine Bioprosthetic Tissue Valve (size 29mm, model #7300TFX, serial #[id number])   Tricuspid Valve Repair  Edwards mc3 ring annuloplasty (size 26mm, model #4900, serial #[id number])   Maze Procedure   complete bilateral atrial atrial lesion set using bipolar radiofrequency and cryothermy ablation  clipping of left atrial appendage (Atriclip size 40mm)    Surgeon: Salvatore Decent. Cornelius Moras, MD  Assistant: Rowe Clack, PA-C  Anesthesia: Cecile Hearing, MD  Operative Findings:  Rheumatic mitral valve disease  Severe mitral stenosis  Type IIIA mitral valve dysfunction with mild mitral regurgitation  Normal LV systolic function  Type I tricuspid valve dysfunction with severe tricuspid regurgitation  Severe pulmonary hypertension  Mild RV systolic dysfunction  Trivial tricuspid regurgitation after successful valve repair                    BRIEF CLINICAL NOTE AND INDICATIONS FOR SURGERY  Patient is a 75 year old morbidly obese female with history of rheumatic fever during childhood, long-standing rheumatic heart disease with mitral stenosis, chronic persistent atrial fibrillation on long-term anticoagulation, chronic diastolic congestive heart failure, hypertension, pulmonary hypertension, tricuspid regurgitation, stage III chronic kidney disease, and venous insufficiency with severe chronic lower extremity edema who has been referred for surgical consultation to discuss treatment options for management of rheumatic mitral stenosis and tricuspid regurgitation. The patient states that she had rheumatic fever when she  was a child and was ill for several months. She has been diagnosed with a heart murmur most of her life. She has been diagnosed with rheumatic mitral stenosis and chronic persistent atrial fibrillation for many years. She was followed for a while by [name] but then lost to follow-up. She moved to [place] to live with her son for a while but was not seen by a cardiologist. More recently she returned to this area to be near her daughter for the last couple of years she has been followed by Dr. Wyline Mood. The patient states that she has been doing better over the past year or so since Dr. Wyline Mood began to manage her long-term medical therapy. However, management of the patient's chronic diastolic congestive heart failure has been challenging. Previous transesophageal echocardiogram performed 06/26/2015 revealed rheumatic mitral valve disease with at least moderate mitral stenosis. Mean transvalvular gradient was estimated 8 mmHg corresponding to mitral valve area estimated 1.3 cm by pressure half-time. Follow-up transthoracic echocardiogram performed 03/19/2016 revealed normal left ventricular size and systolic function. There was rheumatic mitral stenosis with severe thickening and restricted leaflet mobility involving both leaflets of the mitral valve. There was mild calcification of the leaflets with significant annular calcification. There was moderate to severe mitral stenosis with mean transvalvular gradient estimated 9 mmHg corresponding to valve area estimated 1.02 cm. The patient subsequently underwent left and right heart catheterization by Dr. Excell Seltzer on 04/16/2016. She was found to have mild nonobstructive coronary artery disease. There was moderate pulmonary hypertension with PA pressures measured 62/30 and mean pulmonary artery pressure 45 mmHg. Central venous pressure was 12. There was at least moderate mitral stenosis with peak to peak gradient 12 mmHg, mean transvalvular gradient 14.8 mmHg, and  mitral valve area calculated 1.39 cm. The patient was referred for surgical consultation.  The patient has been seen in consultation and counseled at length regarding the indications, risks and potential benefits of surgery.  All questions have been answered, and the patient provides full informed consent for the operation as described.    DETAILS OF THE OPERATIVE PROCEDURE  Preparation:  The patient is brought to the operating room on the above mentioned date and central monitoring was established by the anesthesia team including placement of Swan-Ganz catheter and radial arterial line. The patient is placed in the supine position on the operating table.  Intravenous antibiotics are administered. General endotracheal anesthesia is induced uneventfully. A Foley catheter is placed.  Baseline transesophageal echocardiogram was performed.  Findings were notable for classical rheumatic mitral valve disease with severe mitral stenosis. Both the anterior and posterior leaflets of the mitral valve were thickened. There was severe foreshortening and thickening of the subvalvular apparatus. There was mild mitral regurgitation. Left ventricular size and systolic function appeared normal. The left atrium was dilated. There was mild right ventricular dysfunction and mild right ventricular chamber enlargement. The tricuspid annulus was not dilated and measured only 3.6 cm in diameter. There was severe central tricuspid regurgitation. The aortic valve appeared normal. No other significant abnormalities were noted.  The patient's chest, abdomen, both groins, and both lower extremities are prepared and draped in a sterile manner. A time out procedure is performed.   Surgical Approach:  A median sternotomy incision was performed and the pericardium is opened. The ascending aorta is normal in appearance.    Extracorporeal Cardiopulmonary Bypass and Myocardial Protection:  The right common femoral vein is  cannulated using the Seldinger technique and a guidewire advanced into the right atrium using TEE guidance.  The patient is heparinized systemically and the femoral vein cannulated using a 22 Fr long femoral venous cannula.  The ascending aorta is cannulated for cardiopulmonary bypass.  Adequate heparinization is verified.   A retrograde cardioplegia cannula is placed through the right atrium into the coronary sinus.   The entire pre-bypass portion of the operation was notable for stable hemodynamics.  Cardiopulmonary bypass was begun and the surface of the heart is inspected.  A second venous cannula is placed directly into the superior vena cava.   A cardioplegia cannula is placed in the ascending aorta.  A temperature probe was placed in the interventricular septum.  The patient is cooled to 32C systemic temperature.  The aortic cross clamp is applied and cold blood cardioplegia is delivered initially in an antegrade fashion through the aortic root.  Supplemental cardioplegia is given retrograde through the coronary sinus catheter.  Iced saline slush is applied for topical hypothermia.  The initial cardioplegic arrest is rapid with early diastolic arrest.  Repeat doses of cardioplegia are administered intermittently throughout the entire cross clamp portion of the operation through the aortic root and through the coronary sinus catheter in order to maintain completely flat electrocardiogram and septal myocardial temperature below 15C.  Myocardial protection was felt to be excellent.   Maze Procedure (left atrial lesion set):  The AtriCure Synergy bipolar radiofrequency ablation clamp is used for all radiofrequency ablation lesions for the maze procedure.  The Atricure CryoICE nitrous oxide cryothermy system is utilized for all cryothermy ablation lesions.   The heart is retracted towards the surgeon's side and the left sided pulmonary veins exposed.  An elliptical ablation lesion is created around  the base of the left sided pulmonary veins.  A similar elliptical lesion was created around the base of the left  atrial appendage.  The left atrial appendage was obliterated using an Atricure left atrial appendage clip (Atriclip, size 40mm).  The heart was replaced into the pericardial sac.  A left atriotomy incision was performed through the interatrial groove and extended partially across the back wall of the left atrium after opening the oblique sinus inferiorly.  The floor of the left atrium and the mitral valve were exposed using a self-retaining retractor.    An ablation lesion was placed around the right sided pulmonary veins using the bipolar clamp with one limb of the clamp along the endocardial surface and one along the epicardial surface posteriorly.  A bipolar ablation lesion was placed across the dome of the left atrium from the cephalad apex of the atriotomy incision to reach the cephalad apex of the elliptical lesion around the left sided pulmonary veins.  A similar bipolar lesion was placed across the back wall of the left atrium from the caudad apex of the atriotomy incision to reach the caudad apex of the elliptical lesion around the left sided pulmonary veins, thereby completing a box.  Finally another bipolar lesion was placed across the back wall of the left atrium from the caudad apex of the atriotomy incision towards the posterior mitral valve annulus.  This lesion was completed along the endocardial surface onto the posterior mitral annulus with a 3 minute duration cryothermy lesion, followed by a second cryothermy lesion along the posterior epicardial surface of the left atrium to the coronary sinus.  This completes the entire left side lesion set of the Cox maze procedure.   Mitral Valve Replacement:  The mitral valve was inspected and notable for classical rheumatic mitral valve disease with severe thickening and fibrosis involving both the anterior and the posterior leaflet.  There was a fishmouth appearance of the valve. There was thickening and foreshortening of all of the subvalvular apparatus. There was a mild amount of calcification.  The anterior leaflet of the mitral valve was excised sharply, leaving a small rim of the free margin and the associated primary chords.  The posterior leaflet split in the midline.  The mitral annulus was sized to accept a 29 mm prosthesis.  The left ventricle was irrigated with copious cold saline solution.  Mitral valve replacement was performed using interrupted horizontal mattress 2-0 Ethibond pledgeted sutures with pledgets in the supraannular position.  The remaining portions of the anterior leaflet were incorporated into the suture line laterally, thereby preserving chords to both the anterior and posterior leaflet.  An [facility] Mitral bovine bioprosthetic tissue valve (size 29 mm, model # 7300TFX, serial # [id number]) was implanted uneventfully. All sutures were secured using a Cor-knot device.  The valve seated appropriately with care to position the commissure posts away from the left ventricular outflow tract.  Rewarming is begun.  The atriotomy was closed using a 2-layer closure of running 3-0 Prolene suture after placing a sump drain across the mitral valve to serve as a left ventricular vent.    Maze Procedure (right atrial lesion set):  The inferior vena cava cannula was pulled down until the tip was just below the junction between the right atrium and the inferior vena cava. An oblique incision is made in the right atrium. Traction sutures are placed to facilitate exposure of the tricuspid valve. The tricuspid valve is inspected carefully. The tricuspid valve leaflets appear normal with normal mobility and no sign of any fibrosis or thickening. The retrograde cardioplegia cannula was removed.  The AtriCure Synergy bipolar  radiofrequency ablation clamp is utilized to create a series of linear lesions in the right atrium, each  with one limb of the clamp along the endocardial surface and the other along the epicardial surface. The first lesion is placed from the posterior apex of the atriotomy incision and along the lateral wall of the right atrium to reach the lateral aspect of the superior vena cava. A second lesion is placed in the opposite direction from the posterior apex of the atriotomy incision along the lateral wall to reach the lateral aspect of the inferior vena cava. A third lesion is placed from the midportion of the atriotomy incision extending at a right angle to reach the tip of the right atrial appendage. A fourth lesion is placed from the anterior apex of the atriotomy incision in an anterior and inferior direction to reach the acute margin of the heart. Finally, the cryotherapy probe is utilized to complete the right atrial lesion set by placing the probe along the endocardial surface of the right atrium from the anterior apex of the atriotomy incision to reach the tricuspid annulus at the 2:00 position.    Tricuspid Valve Repair:  Tricuspid ring annuloplasty is performed using interrupted 2-0 Ethibond horizontal mattress sutures placed circumferentially around the tricuspid annulus with exception of the area immediately below the triangle of Koch.  One final dose of warm retrograde "hot shot" cardioplegia was administered retrograde through the coronary sinus catheter while all air was evacuated through the aortic root.  The aortic cross clamp was removed after a total cross clamp time of 138 minutes.  The valve was sized to accept a 26 mm annuloplasty ring based upon the overall surface area of the combined anterior and posterior leaflets. An [facility] 3 annuloplasty ring (size 26mm, model #4900, serial [id number]) is implanted uneventfully. After completion of the annuloplasty the valve was tested with saline and appears to be competent. The right atriotomy incision is closed using a 2 layer closure of running 4-0  Prolene suture.  The right atriotomy incision is closed with a 2 layer closure of running 4-0 Prolene suture.   Procedure Completion:  Epicardial pacing wires are fixed to the right ventricular outflow tract and to the right atrial appendage. The patient is rewarmed to 37C temperature. The aortic and left ventricular vents are removed.  The patient is weaned and disconnected from cardiopulmonary bypass.  The patient's rhythm at separation from bypass was junctional.  The patient was weaned from cardioplegic bypass without any inotropic support. Total cardiopulmonary bypass time for the operation was 193 minutes.  Followup transesophageal echocardiogram performed after separation from bypass revealed a well-seated mitral valve prosthesis that was functioning normally and without any sign of perivalvular leak.  Left ventricular function was unchanged from preoperatively.  The tricuspid valve was functioning normally with trivial residual tricuspid regurgitation.  The aortic and superior vena cava cannula were removed uneventfully. Protamine was administered to reverse the anticoagulation. The femoral venous cannula was removed and manual pressure held on the groin for 30 minutes.  The mediastinum and pleural space were inspected for hemostasis and irrigated with saline solution. The mediastinum and both pleural spaces were drained using 4 chest tubes placed through separate stab incisions inferiorly.  The soft tissues anterior to the aorta were reapproximated loosely. The sternum is closed with double strength sternal wire. The soft tissues anterior to the sternum were closed in multiple layers and the skin is closed with a running subcuticular skin closure.   The  post-bypass portion of the operation was notable for stable rhythm and hemodynamics.   No blood products were administered during the operation.   Patient Disposition:  The patient tolerated the procedure well and is transported to the  surgical intensive care in stable condition. There are no intraoperative complications. All sponge instrument and needle counts are verified correct at completion of the operation.     Salvatore Decent. Cornelius Moras MD 06/18/2016 2:15 PM

## 2016-06-25 NOTE — OR Nursing (Signed)
13:20 - 45 minutes call to SICU charge nurse - Wynona Caneshristine. 14:00 - 20 minute call to SICU charge nurse

## 2016-06-25 NOTE — Brief Op Note (Addendum)
06/04/2016  12:52 PM  PATIENT:  Ann Potter  75 y.o. female      [address]       [name]             [phone]     06/14/2016  12:53 PM  PATIENT:  Ann Potter  75 y.o. female  PRE-OPERATIVE DIAGNOSIS:  MS TR AFIB  POST-OPERATIVE DIAGNOSIS:  MS TR AFIB  PROCEDURE:  Procedure(s): MITRAL VALVE (MV) REPLACEMENT #29 MAGNA MITRAL BIOPROSTHETIC TRICUSPID VALVE REPAIR MC3 RING ANNULOPLASTY MAZE TRANSESOPHAGEAL ECHOCARDIOGRAM (TEE)  SURGEON:    Purcell Nailslarence H Edsel Shives, MD  ASSISTANTS:  Rowe ClackWayne E Gold, PA-C  ANESTHESIA:   Cecile HearingStephen Edward Turk, MD  CROSSCLAMP TIME:   138'  CARDIOPULMONARY BYPASS TIME: 193'  FINDINGS:  Rheumatic mitral valve disease  Severe mitral stenosis  Type IIIA mitral valve dysfunction with mild mitral regurgitation  Normal LV systolic function  Type I tricuspid valve dysfunction with severe tricuspid regurgitation  Severe pulmonary hypertension  Mild RV systolic dysfunction  Trivial tricuspid regurgitation after successful valve repair   Maze Procedure  Surgical Approach: Median sternotomy  Cut-and-sew:  No.  Cryo: Yes  Cryo Lesions (select all that apply):       6  Mitral Valve Cryo Lesion,     10  Tricuspid Cryo Lesion, 16  Other - epicardial posterior AV groove and coronary sinus  Radiofrequency:  Yes.  Bipolar: Yes.  RF Lesions (select all that apply):      1   Pulmonary Vein Isolation,    2   Box Lesion,   3a  Inferior Pulmonary Vein Connecting Lesion,   3b  Superior Pulmonary Vein Connecting Lesion,     4  Posterior Mirtal Annular Line,    11  Intercaval Line,   15a  RAA Lateral Wall (Short) and   15b  RAA Lateral Wall to "T" Lesion    Left Atrial Appendage Treatment:    Yes -  epicardial clip  COMPLICATIONS: None  BASELINE WEIGHT: 108 kg  PATIENT DISPOSITION:   TO SICU IN STABLE CONDITION  Purcell Nailslarence H Chapin Arduini, MD 05/29/2016 2:10 PM

## 2016-06-25 NOTE — Progress Notes (Signed)
Patient did arouse to voice, became agitated, oxygen sats dropped into the mid 80s, patient was bucking/fighting the vent, pain meds given, will monitor

## 2016-06-25 NOTE — Progress Notes (Signed)
Patient ID: Ann PoeGloria Pallett, female   DOB: May 25, 1941, 75 y.o.   MRN: [id number]   SICU Evening Rounds:   Hemodynamically stable  CI = 3.6 on dop 3, milrinone 0.2, neo  Has started to wake up on vent.    Urine output good  CT output low  CBC    Component Value Date/Time   WBC 12.0 (H) Mar 19, 2016 1506   RBC 3.28 (L) Mar 19, 2016 1506   HGB 10.5 (L) Mar 19, 2016 1506   HCT 31.8 (L) Mar 19, 2016 1506   PLT 144 (L) Mar 19, 2016 1506   MCV 97.0 Mar 19, 2016 1506   MCH 32.0 Mar 19, 2016 1506   MCHC 33.0 Mar 19, 2016 1506   RDW 14.4 Mar 19, 2016 1506   LYMPHSABS 1.1 08/01/2012 1440   MONOABS 0.3 08/01/2012 1440   EOSABS 0.1 08/01/2012 1440   BASOSABS 0.0 08/01/2012 1440     BMET    Component Value Date/Time   NA 142 Mar 19, 2016 1452   K 2.7 (LL) Mar 19, 2016 1452   CL 96 (L) Mar 19, 2016 1313   CO2 29 06/22/2016 1309   GLUCOSE 71 Mar 19, 2016 1452   BUN 24 (H) Mar 19, 2016 1313   CREATININE 1.10 (H) Mar 19, 2016 1313   CALCIUM 9.2 06/22/2016 1309   GFRNONAA 36 (L) 06/22/2016 1309   GFRAA 42 (L) 06/22/2016 1309     A/P:  Stable postop course. Continue current plans, wean vent as tolerated.

## 2016-06-25 NOTE — Anesthesia Procedure Notes (Signed)
Procedure Name: Intubation Date/Time: 06/16/2016 7:52 AM Performed by: Reine JustFLOWERS, Jenea Dake T Pre-anesthesia Checklist: Patient identified, Emergency Drugs available, Suction available, Patient being monitored and Timeout performed Patient Re-evaluated:Patient Re-evaluated prior to inductionOxygen Delivery Method: Circle system utilized and Simple face mask Preoxygenation: Pre-oxygenation with 100% oxygen Intubation Type: IV induction Ventilation: Mask ventilation without difficulty and Oral airway inserted - appropriate to patient size Laryngoscope Size: Hyacinth MeekerMiller and 2 Grade View: Grade I Tube type: Subglottic suction tube Tube size: 7.5 mm Number of attempts: 1 Airway Equipment and Method: Patient positioned with wedge pillow and Stylet Placement Confirmation: ETT inserted through vocal cords under direct vision,  positive ETCO2 and breath sounds checked- equal and bilateral Secured at: 21 cm Tube secured with: Tape Dental Injury: Teeth and Oropharynx as per pre-operative assessment

## 2016-06-25 NOTE — Progress Notes (Signed)
Echocardiogram Echocardiogram Transesophageal has been performed.  Ann Potter 06/18/2016, 9:02 AM

## 2016-06-25 NOTE — Transfer of Care (Signed)
Immediate Anesthesia Transfer of Care Note  Patient: Ann Potter  Procedure(s) Performed: Procedure(s): MITRAL VALVE (MV) REPLACEMENT (N/A) TRICUSPID VALVE REPAIR (N/A) MAZE (N/A) TRANSESOPHAGEAL ECHOCARDIOGRAM (TEE) (N/A)  Patient Location: SICU  Anesthesia Type:General  Level of Consciousness: Patient remains intubated per anesthesia plan  Airway & Oxygen Therapy: Patient remains intubated per anesthesia plan and Patient placed on Ventilator (see vital sign flow sheet for setting)  Post-op Assessment: Report given to RN and Post -op Vital signs reviewed and stable  Post vital signs: Reviewed and stable  Last Vitals:  Vitals:   06/02/2016 0614  BP: (!) 148/67  Pulse: 81  Resp: 20  Temp: 36.6 C    Last Pain:  Vitals:   06/08/2016 0614  TempSrc: Oral      Patients Stated Pain Goal: 2 (06/14/2016 [id number])  Complications: No apparent anesthesia complications

## 2016-06-25 NOTE — Anesthesia Procedure Notes (Signed)
Central Venous Catheter Insertion Performed by: anesthesiologist Patient location: Pre-op. Preanesthetic checklist: patient identified, IV checked, site marked, risks and benefits discussed, surgical consent, monitors and equipment checked, pre-op evaluation, timeout performed and anesthesia consent Landmarks identified PA cath was placed.Swan type and PA catheter depth:thermodilation and 47PA Cath depth:47 Procedure performed without using ultrasound guided technique. Attempts: 1 Patient tolerated the procedure well with no immediate complications.

## 2016-06-25 NOTE — Anesthesia Postprocedure Evaluation (Signed)
Anesthesia Post Note  Patient: Ann Potter  Procedure(s) Performed: Procedure(s) (LRB): MITRAL VALVE (MV) REPLACEMENT (N/A) TRICUSPID VALVE REPAIR (N/A) MAZE (N/A) TRANSESOPHAGEAL ECHOCARDIOGRAM (TEE) (N/A)  Patient location during evaluation: SICU Anesthesia Type: General Level of consciousness: sedated and patient remains intubated per anesthesia plan Pain management: pain level controlled Vital Signs Assessment: post-procedure vital signs reviewed and stable Respiratory status: patient remains intubated per anesthesia plan and patient on ventilator - see flowsheet for VS Cardiovascular status: stable Anesthetic complications: no    Last Vitals:  Vitals:   06/02/2016 1730 06/01/2016 1745  BP:    Pulse: 89 89  Resp: 15 13  Temp: 36.6 C 36.6 C    Last Pain:  Vitals:   06/09/2016 1700  TempSrc: Core (Comment)                 Cecile HearingStephen Edward Malu Pellegrini

## 2016-06-25 NOTE — Anesthesia Procedure Notes (Signed)
Central Venous Catheter Insertion Performed by: anesthesiologist Patient location: Pre-op. Preanesthetic checklist: patient identified, IV checked, site marked, risks and benefits discussed, surgical consent, monitors and equipment checked, pre-op evaluation, timeout performed and anesthesia consent Position: Trendelenburg Lidocaine 1% used for infiltration Landmarks identified Catheter size: 9 Fr MAC introducer Procedure performed using ultrasound guided technique. Attempts: 1 Following insertion, line sutured and dressing applied. Post procedure assessment: blood return through all ports, free fluid flow and no air. Patient tolerated the procedure well with no immediate complications.       

## 2016-06-25 NOTE — Interval H&P Note (Signed)
History and Physical Interval Note:  05/29/2016 6:26 AM  Ann Potter  has presented today for surgery, with the diagnosis of MS TR AFIB  The various methods of treatment have been discussed with the patient and family. After consideration of risks, benefits and other options for treatment, the patient has consented to  Procedure(s): MITRAL VALVE (MV) REPLACEMENT (N/A) TRICUSPID VALVE REPAIR (N/A) MAZE (N/A) TRANSESOPHAGEAL ECHOCARDIOGRAM (TEE) (N/A) as a surgical intervention .  The patient's history has been reviewed, patient examined, no change in status, stable for surgery.  I have reviewed the patient's chart and labs.  Questions were answered to the patient's satisfaction.     Purcell Nailslarence H Owen

## 2016-06-26 ENCOUNTER — Encounter (HOSPITAL_COMMUNITY): Payer: Self-pay | Admitting: Thoracic Surgery (Cardiothoracic Vascular Surgery)

## 2016-06-26 ENCOUNTER — Inpatient Hospital Stay (HOSPITAL_COMMUNITY): Payer: Medicare Other

## 2016-06-26 LAB — CBC
HEMATOCRIT: 29.8 % — AB (ref 36.0–46.0)
HEMATOCRIT: 28.9 % — AB (ref 36.0–46.0)
Hemoglobin: 9.3 g/dL — ABNORMAL LOW (ref 12.0–15.0)
Hemoglobin: 9.3 g/dL — ABNORMAL LOW (ref 12.0–15.0)
MCH: 30.8 pg (ref 26.0–34.0)
MCH: 31.8 pg (ref 26.0–34.0)
MCHC: 31.2 g/dL (ref 30.0–36.0)
MCHC: 32.2 g/dL (ref 30.0–36.0)
MCV: 98.7 fL (ref 78.0–100.0)
MCV: 99 fL (ref 78.0–100.0)
PLATELETS: 147 10*3/uL — AB (ref 150–400)
PLATELETS: 126 10*3/uL — AB (ref 150–400)
RBC: 3.02 MIL/uL — AB (ref 3.87–5.11)
RBC: 2.92 MIL/uL — ABNORMAL LOW (ref 3.87–5.11)
RDW: 14.6 % (ref 11.5–15.5)
RDW: 14.6 % (ref 11.5–15.5)
WBC: 11.5 10*3/uL — AB (ref 4.0–10.5)
WBC: 11.5 10*3/uL — ABNORMAL HIGH (ref 4.0–10.5)

## 2016-06-26 LAB — POCT I-STAT 3, ART BLOOD GAS (G3+)
Acid-base deficit: 1 mmol/L (ref 0.0–2.0)
Acid-base deficit: 1 mmol/L (ref 0.0–2.0)
Acid-base deficit: 3 mmol/L — ABNORMAL HIGH (ref 0.0–2.0)
BICARBONATE: 24.2 mmol/L (ref 20.0–28.0)
BICARBONATE: 23.2 mmol/L (ref 20.0–28.0)
Bicarbonate: 25 mmol/L (ref 20.0–28.0)
O2 SAT: 96 %
O2 Saturation: 97 %
O2 Saturation: 96 %
PCO2 ART: 44.7 mmHg (ref 32.0–48.0)
PCO2 ART: 40.3 mmHg (ref 32.0–48.0)
PCO2 ART: 43.2 mmHg (ref 32.0–48.0)
PH ART: 7.386 (ref 7.350–7.450)
PO2 ART: 82 mmHg — AB (ref 83.0–108.0)
PO2 ART: 92 mmHg (ref 83.0–108.0)
PO2 ART: 90 mmHg (ref 83.0–108.0)
Patient temperature: 36.6
Patient temperature: 36.8
Patient temperature: 36.8
TCO2: 26 mmol/L (ref 0–100)
TCO2: 25 mmol/L (ref 0–100)
TCO2: 25 mmol/L (ref 0–100)
pH, Arterial: 7.354 (ref 7.350–7.450)
pH, Arterial: 7.337 — ABNORMAL LOW (ref 7.350–7.450)

## 2016-06-26 LAB — GLUCOSE, CAPILLARY
GLUCOSE-CAPILLARY: 112 mg/dL — AB (ref 65–99)
GLUCOSE-CAPILLARY: 113 mg/dL — AB (ref 65–99)
GLUCOSE-CAPILLARY: 116 mg/dL — AB (ref 65–99)
GLUCOSE-CAPILLARY: 98 mg/dL (ref 65–99)
Glucose-Capillary: 101 mg/dL — ABNORMAL HIGH (ref 65–99)
Glucose-Capillary: 103 mg/dL — ABNORMAL HIGH (ref 65–99)
Glucose-Capillary: 104 mg/dL — ABNORMAL HIGH (ref 65–99)
Glucose-Capillary: 106 mg/dL — ABNORMAL HIGH (ref 65–99)
Glucose-Capillary: 108 mg/dL — ABNORMAL HIGH (ref 65–99)
Glucose-Capillary: 108 mg/dL — ABNORMAL HIGH (ref 65–99)
Glucose-Capillary: 110 mg/dL — ABNORMAL HIGH (ref 65–99)
Glucose-Capillary: 111 mg/dL — ABNORMAL HIGH (ref 65–99)
Glucose-Capillary: 112 mg/dL — ABNORMAL HIGH (ref 65–99)
Glucose-Capillary: 113 mg/dL — ABNORMAL HIGH (ref 65–99)
Glucose-Capillary: 120 mg/dL — ABNORMAL HIGH (ref 65–99)
Glucose-Capillary: 133 mg/dL — ABNORMAL HIGH (ref 65–99)
Glucose-Capillary: 133 mg/dL — ABNORMAL HIGH (ref 65–99)
Glucose-Capillary: 93 mg/dL (ref 65–99)

## 2016-06-26 LAB — CARBOXYHEMOGLOBIN
CARBOXYHEMOGLOBIN: 1.5 % (ref 0.5–1.5)
Methemoglobin: 1.1 % (ref 0.0–1.5)
O2 SAT: 66.6 %
TOTAL HEMOGLOBIN: 9.3 g/dL — AB (ref 12.0–16.0)

## 2016-06-26 LAB — CREATININE, SERUM
Creatinine, Ser: 1.19 mg/dL — ABNORMAL HIGH (ref 0.44–1.00)
GFR calc non Af Amer: 43 mL/min — ABNORMAL LOW (ref >60)
GFR, EST AFRICAN AMERICAN: 50 mL/min — AB (ref >60)

## 2016-06-26 LAB — POCT I-STAT, CHEM 8
BUN: 21 mg/dL — ABNORMAL HIGH (ref 6–20)
Calcium, Ion: 1.15 mmol/L (ref 1.15–1.40)
Chloride: 100 mmol/L — ABNORMAL LOW (ref 101–111)
Creatinine, Ser: 1.1 mg/dL — ABNORMAL HIGH (ref 0.44–1.00)
Glucose, Bld: 129 mg/dL — ABNORMAL HIGH (ref 65–99)
HEMATOCRIT: 26 % — AB (ref 36.0–46.0)
HEMOGLOBIN: 8.8 g/dL — AB (ref 12.0–15.0)
POTASSIUM: 4.3 mmol/L (ref 3.5–5.1)
SODIUM: 137 mmol/L (ref 135–145)
TCO2: 25 mmol/L (ref 0–100)

## 2016-06-26 LAB — BASIC METABOLIC PANEL
ANION GAP: 7 (ref 5–15)
BUN: 19 mg/dL (ref 6–20)
CALCIUM: 8.4 mg/dL — AB (ref 8.9–10.3)
CO2: 25 mmol/L (ref 22–32)
CREATININE: 1.14 mg/dL — AB (ref 0.44–1.00)
Chloride: 104 mmol/L (ref 101–111)
GFR calc non Af Amer: 46 mL/min — ABNORMAL LOW (ref >60)
GFR, EST AFRICAN AMERICAN: 53 mL/min — AB (ref >60)
Glucose, Bld: 113 mg/dL — ABNORMAL HIGH (ref 65–99)
Potassium: 3.5 mmol/L (ref 3.5–5.1)
SODIUM: 136 mmol/L (ref 135–145)

## 2016-06-26 LAB — MAGNESIUM
Magnesium: 2.5 mg/dL — ABNORMAL HIGH (ref 1.7–2.4)
Magnesium: 2.4 mg/dL (ref 1.7–2.4)

## 2016-06-26 MED ORDER — DEXTROSE 5 % IV SOLN
10.0000 mg/h | INTRAVENOUS | Status: DC
Start: 2016-06-26 — End: 2016-07-07
  Administered 2016-06-26 – 2016-06-27 (×2): 8 mg/h via INTRAVENOUS
  Administered 2016-06-29 – 2016-07-01 (×5): 10 mg/h via INTRAVENOUS
  Administered 2016-07-02 – 2016-07-03 (×2): 14 mg/h via INTRAVENOUS
  Administered 2016-07-05: 7 mg/h via INTRAVENOUS
  Administered 2016-07-07: 10 mg/h via INTRAVENOUS
  Filled 2016-06-26 (×24): qty 25

## 2016-06-26 MED ORDER — IPRATROPIUM-ALBUTEROL 0.5-2.5 (3) MG/3ML IN SOLN: 3.0000 mL | Freq: Four times a day (QID) | RESPIRATORY_TRACT | Status: DC | PRN

## 2016-06-26 MED ORDER — ENOXAPARIN SODIUM 30 MG/0.3ML ~~LOC~~ SOLN
30.0000 mg | SUBCUTANEOUS | Status: DC
  Administered 2016-06-27 – 2016-07-02 (×6): 30 mg via SUBCUTANEOUS
  Filled 2016-06-26 (×7): qty 0.3

## 2016-06-26 MED ORDER — INSULIN ASPART 100 UNIT/ML ~~LOC~~ SOLN
0.0000 [IU] | SUBCUTANEOUS | Status: DC
  Administered 2016-06-26: 2 [IU] via SUBCUTANEOUS

## 2016-06-26 MED ORDER — POTASSIUM CHLORIDE 10 MEQ/50ML IV SOLN
10.0000 meq | INTRAVENOUS | Status: DC
Start: 2016-06-26 — End: 2016-06-26
  Administered 2016-06-26 (×3): 10 meq via INTRAVENOUS
  Filled 2016-06-26 (×3): qty 50

## 2016-06-26 MED ORDER — MONTELUKAST SODIUM 10 MG PO TABS
10.0000 mg | ORAL_TABLET | Freq: Every day | ORAL | Status: DC
  Administered 2016-06-26 – 2016-07-07 (×11): 10 mg via ORAL
  Filled 2016-06-26 (×10): qty 1

## 2016-06-26 MED ORDER — INSULIN DETEMIR 100 UNIT/ML ~~LOC~~ SOLN
20.0000 [IU] | Freq: Two times a day (BID) | SUBCUTANEOUS | Status: DC
  Administered 2016-06-26 (×2): 20 [IU] via SUBCUTANEOUS
  Filled 2016-06-26 (×4): qty 0.2

## 2016-06-26 MED FILL — Heparin Sodium (Porcine) Inj 1000 Unit/ML: INTRAMUSCULAR | Qty: 2500 | Status: AC

## 2016-06-26 MED FILL — Dexmedetomidine HCl in NaCl 0.9% IV Soln 400 MCG/100ML: INTRAVENOUS | Qty: 100 | Status: AC

## 2016-06-26 NOTE — Progress Notes (Signed)
RT tried to wean Pt per protocol. Pt followed direction but her NIF was only a -5(RT tried the NIF 3 times). FVC was .9L. Pt RR increased and the pt was making a good effort.

## 2016-06-26 NOTE — Progress Notes (Signed)
Patient arousable to voice, became agitated, and respiratory rate increased into 40s with blood pressure dropping 70/40s. Patient fighting vent. Reassured patient and went right back to sleep. Patient still requiring neo at 70. Will continue to monitor patient and assess to begin rapid wean protocol.  Horton ChinMacKayla A Raechell Singleton, RN

## 2016-06-26 NOTE — Care Management Note (Signed)
Case Management Note  Patient Details  Name: Ann Potter MRN: [id number] Date of Birth: [birth year]  Subjective/Objective:  Pt lives alone and ambulates with walker per son.  Pt and family anticipate she will need ST-SNF for rehab before returning home and states pt would prefer [facility].  CM explained process including PT/OT eval and payor's contract with SNFs.  Son demonstrates understanding, states pt was independent with ADLs but needed assistance with all IADLs prior to surgery, will now need assistance with ADLs and family unable to provide.                        Expected Discharge Plan:  Skilled Nursing Facility  In-House Referral:  Clinical Social Work  Discharge planning Services  CM Consult  Status of Service:  In process, will continue to follow  Magdalene RiverMayo, Elliette Seabolt T, RN 06/26/2016, 10:36 AM

## 2016-06-26 NOTE — Progress Notes (Signed)
      [address]       [place] [id number]             [phone]      Resting comfortably  BP (!) 99/59   Pulse 80   Temp 98.1 F (36.7 C) (Oral)   Resp (!) 21   Ht 5' (1.524 m)   Wt 260 lb 9.3 oz (118.2 kg)   SpO2 100%   BMI 50.89 kg/m   On dopamine at 3 mcg/kg/min  Lasix drip started   Intake/Output Summary (Last 24 hours) at 06/26/16 1915 Last data filed at 06/26/16 1800  Gross per 24 hour  Intake          3237.79 ml  Output             2155 ml  Net          1082.79 ml    Continue present care  Viviann SpareSteven C. Dorris FetchHendrickson, MD [facility] [phone]

## 2016-06-26 NOTE — Progress Notes (Signed)
RT and the RN made several attempt to wean the Pt.  The Pt wasn't able to get the appropriate parameters with the NIF and the FVC. The Pt follows direction but seems to still be very sleepy. RT started weaning again at [id number]

## 2016-06-26 NOTE — Progress Notes (Signed)
Tried Parameters again and the Pt wasn't able to do  The FVC and NIF

## 2016-06-26 NOTE — Procedures (Signed)
Extubation Procedure Note  Patient Details:   Name: Sherle PoeGloria Padula DOB: 01/10/1941 MRN: [id number]   Airway Documentation:     Evaluation  O2 sats: stable throughout Complications: No apparent complications Patient did tolerate procedure well. Bilateral Breath Sounds: Clear   Yes   Pt tolerated wean, positive for cuff leak, extubated to 4LNC. No dyspnea or stridor noted after extubation. RT will continue to monitor.   Armando GangMike, Li Bobo C 06/26/2016, 8:47 AM

## 2016-06-26 NOTE — Progress Notes (Signed)
[address]       [name] [id number]             [phone]        CARDIOTHORACIC SURGERY PROGRESS NOTE   R1 Day Post-Op Procedure(s) (LRB): MITRAL VALVE (MV) REPLACEMENT (N/A) TRICUSPID VALVE REPAIR (N/A) MAZE (N/A) TRANSESOPHAGEAL ECHOCARDIOGRAM (TEE) (N/A)  Subjective: Awake on alert on vent.  Follows simple commands.  Has been unable to perform respiratory mechanics but breathing comfortably on CPAP/PS  Objective: Vital signs: BP Readings from Last 1 Encounters:  06/26/16 93/70   Pulse Readings from Last 1 Encounters:  06/26/16 89   Resp Readings from Last 1 Encounters:  06/26/16 (!) 22   Temp Readings from Last 1 Encounters:  06/26/16 98.2 F (36.8 C)    Hemodynamics: PAP: (43-60)/(20-32) 53/24 CO:  [2.6 L/min-5.3 L/min] 5.3 L/min CI:  [1.3 L/min/m2-5.6 L/min/m2] 2.6 L/min/m2  Physical Exam:  Rhythm:   Junctional 60's - DDD pacing  Breath sounds: clear  Heart sounds:  RRR  Incisions:  Dressing dry, intact  Abdomen:  Soft, non-distended, non-tender  Extremities:  Warm, well-perfused  Chest tubes:  low volume thin serosanguinous output, no air leak     Intake/Output from previous day: 09/28 0701 - 09/29 0700 In: 9241.9 [I.V.:5941.9; Blood:800; IV Piggyback:2500] Out: 3660 [Urine:2335; Blood:900; Chest Tube:425] Intake/Output this shift: No intake/output data recorded.  Lab Results:  CBC: Recent Labs  06/01/2016 2050 06/26/16 0418  WBC 11.5* 11.5*  HGB 9.7* 9.3*  HCT 29.8* 29.8*  PLT 140* 147*    BMET:  Recent Labs  06/20/2016 2050 06/26/16 0418  NA 136 136  K 3.6 3.5  CL 102 104  CO2 25 25  GLUCOSE 143* 113*  BUN 18 19  CREATININE 1.19* 1.14*  CALCIUM 8.3* 8.4*     PT/INR:   Recent Labs  06/11/2016 1506  LABPROT 17.1*  INR 1.38    CBG (last 3)   Recent Labs  06/26/16 0403 06/26/16 0518 06/26/16 0628  GLUCAP 112* 120* 113*    ABG    Component Value Date/Time   PHART 7.354 06/26/2016 0446   PCO2ART 44.7 06/26/2016 0446   PO2ART 82.0 (L) 06/26/2016 0446   HCO3 25.0 06/26/2016 0446   TCO2 26 06/26/2016 0446   ACIDBASEDEF 1.0 06/26/2016 0446   O2SAT 96.0 06/26/2016 0446    CXR: PORTABLE CHEST 1 VIEW  COMPARISON:  06/01/2016.  FINDINGS: Endotracheal tube, NG tube, Swan-Ganz catheter, mediastinal drainage catheter, left chest tube in stable position. No pneumothorax. Prior CABG. Left atrial appendage clip noted. Cardiomegaly. Mild bilateral from interstitial prominence and small pleural effusions. Findings consistent congestive heart failure. Low lung volumes with bibasilar atelectasis .  IMPRESSION: 1. Lines and tubes in stable position. 2. Prior CABG. Cardiomegaly with mild bilateral from interstitial prominence and small pleural effusions. Findings consistent mild congestive heart failure. 3. Lung volumes with bibasilar atelectasis.   Electronically Signed   By: Maisie Fushomas  Register   On: 06/26/2016 07:27   Assessment/Plan: S/P Procedure(s) (LRB): MITRAL VALVE (MV) REPLACEMENT (N/A) TRICUSPID VALVE REPAIR (N/A) MAZE (N/A) TRANSESOPHAGEAL ECHOCARDIOGRAM (TEE) (N/A)  Overall stable POD1 Maintaining stable DDD paced rhythm w/ stable hemodynamics on very low dose milrinone, dopamine and Neo drips Oxygenation stable w/ O2 sats 98% on 40% FiO2 - awake and alert on vent - looks ready for extubation Acute on chronic diastolic CHF with expected post-op volume excess, will need diuresis but still on significant Neo drip Expected post op acute  blood loss anemia, Hgb 9.3 this morning Expected post op atelectasis, mild - may need considerable encouragement post extubation Post op thrombocytopenia, very mild 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

## 2016-06-27 ENCOUNTER — Inpatient Hospital Stay (HOSPITAL_COMMUNITY): Payer: Medicare Other

## 2016-06-27 LAB — BASIC METABOLIC PANEL
ANION GAP: 6 (ref 5–15)
BUN: 21 mg/dL — AB (ref 6–20)
CHLORIDE: 103 mmol/L (ref 101–111)
CO2: 27 mmol/L (ref 22–32)
Calcium: 8.6 mg/dL — ABNORMAL LOW (ref 8.9–10.3)
Creatinine, Ser: 1.18 mg/dL — ABNORMAL HIGH (ref 0.44–1.00)
GFR calc Af Amer: 51 mL/min — ABNORMAL LOW (ref >60)
GFR calc non Af Amer: 44 mL/min — ABNORMAL LOW (ref >60)
Glucose, Bld: 79 mg/dL (ref 65–99)
POTASSIUM: 4.3 mmol/L (ref 3.5–5.1)
SODIUM: 136 mmol/L (ref 135–145)

## 2016-06-27 LAB — CARBOXYHEMOGLOBIN
CARBOXYHEMOGLOBIN: 1.4 % (ref 0.5–1.5)
METHEMOGLOBIN: 1.2 % (ref 0.0–1.5)
O2 SAT: 74.7 %
TOTAL HEMOGLOBIN: 9 g/dL — AB (ref 12.0–16.0)

## 2016-06-27 LAB — GLUCOSE, CAPILLARY
GLUCOSE-CAPILLARY: 73 mg/dL (ref 65–99)
GLUCOSE-CAPILLARY: 82 mg/dL (ref 65–99)
GLUCOSE-CAPILLARY: 72 mg/dL (ref 65–99)
GLUCOSE-CAPILLARY: 90 mg/dL (ref 65–99)
Glucose-Capillary: 137 mg/dL — ABNORMAL HIGH (ref 65–99)
Glucose-Capillary: 57 mg/dL — ABNORMAL LOW (ref 65–99)
Glucose-Capillary: 63 mg/dL — ABNORMAL LOW (ref 65–99)
Glucose-Capillary: 81 mg/dL (ref 65–99)
Glucose-Capillary: 81 mg/dL (ref 65–99)

## 2016-06-27 LAB — CBC
HCT: 28.6 % — ABNORMAL LOW (ref 36.0–46.0)
Hemoglobin: 9.1 g/dL — ABNORMAL LOW (ref 12.0–15.0)
MCH: 31.5 pg (ref 26.0–34.0)
MCHC: 31.8 g/dL (ref 30.0–36.0)
MCV: 99 fL (ref 78.0–100.0)
PLATELETS: 121 10*3/uL — AB (ref 150–400)
RBC: 2.89 MIL/uL — ABNORMAL LOW (ref 3.87–5.11)
RDW: 14.8 % (ref 11.5–15.5)
WBC: 12.1 10*3/uL — AB (ref 4.0–10.5)

## 2016-06-27 MED ORDER — ASPIRIN EC 81 MG PO TBEC
81.0000 mg | DELAYED_RELEASE_TABLET | Freq: Every day | ORAL | Status: DC
  Administered 2016-06-27 – 2016-07-06 (×9): 81 mg via ORAL
  Filled 2016-06-27 (×9): qty 1

## 2016-06-27 MED ORDER — WARFARIN - PHYSICIAN DOSING INPATIENT
Freq: Every day | Status: DC
  Administered 2016-06-30 – 2016-07-04 (×2)
  Administered 2016-07-11: 1
  Administered 2016-07-12: 18:00:00
  Administered 2016-07-14 – 2016-07-15 (×2): 1

## 2016-06-27 MED ORDER — DEXTROSE 50 % IV SOLN
25.0000 mL | Freq: Once | INTRAVENOUS | Status: AC
  Administered 2016-06-27: 25 mL via INTRAVENOUS

## 2016-06-27 MED ORDER — INSULIN ASPART 100 UNIT/ML ~~LOC~~ SOLN
0.0000 [IU] | Freq: Three times a day (TID) | SUBCUTANEOUS | Status: DC
  Administered 2016-06-28 – 2016-07-08 (×12): 2 [IU] via SUBCUTANEOUS

## 2016-06-27 MED ORDER — WARFARIN SODIUM 2.5 MG PO TABS
2.5000 mg | ORAL_TABLET | Freq: Every day | ORAL | Status: DC
  Administered 2016-06-27 – 2016-06-30 (×4): 2.5 mg via ORAL
  Filled 2016-06-27 (×4): qty 1

## 2016-06-27 MED ORDER — INSULIN ASPART 100 UNIT/ML ~~LOC~~ SOLN
0.0000 [IU] | Freq: Every day | SUBCUTANEOUS | Status: DC
Start: 2016-06-27 — End: 2016-07-09

## 2016-06-27 MED ORDER — DEXTROSE 50 % IV SOLN
INTRAVENOUS | Status: AC
  Filled 2016-06-27: qty 50

## 2016-06-27 NOTE — Progress Notes (Signed)
      [address]       [place] [id number]             [phone]      Up in chair  BP 116/79   Pulse 80   Temp 98.3 F (36.8 C) (Oral)   Resp (!) 25   Ht 5' (1.524 m)   Wt 261 lb 7.5 oz (118.6 kg)   SpO2 97%   BMI 51.06 kg/m    Intake/Output Summary (Last 24 hours) at 06/27/16 1840 Last data filed at 06/27/16 1800  Gross per 24 hour  Intake            860.4 ml  Output             1335 ml  Net           -474.6 ml  ' No new issues - continue dopamine and lasix drips  Viviann SpareSteven C. Dorris FetchHendrickson, MD [facility] [phone]

## 2016-06-27 NOTE — Progress Notes (Signed)
Hypoglycemic Event  CBG: 57  Treatment: 4 oz of orange juice     Symptoms: Nono  Follow-up CBG: Time:0750 CBG Result:63  Possible Reasons for Event: poor appetite, 20 units of levimir given (2200 dose)  Comments/MD notified:Will administer 0.5 amp D50  Follow-up CBG (9604(0811): 137  Will continue to monitor patient closely.   Kara MeadGerald S Jace Dowe

## 2016-06-27 NOTE — Progress Notes (Signed)
2 Days Post-Op Procedure(s) (LRB): MITRAL VALVE (MV) REPLACEMENT (N/A) TRICUSPID VALVE REPAIR (N/A) MAZE (N/A) TRANSESOPHAGEAL ECHOCARDIOGRAM (TEE) (N/A) Subjective: Some incisional pain  Objective: Vital signs in last 24 hours: Temp:  [97.4 F (36.3 C)-98.7 F (37.1 C)] 98.3 F (36.8 C) (09/30 0738) Pulse Rate:  [67-80] 67 (09/30 0830) Cardiac Rhythm: A-V Sequential paced (09/30 0800) Resp:  [14-28] 14 (09/30 0830) BP: (78-117)/(27-71) 97/50 (09/30 0800) SpO2:  [94 %-100 %] 100 % (09/30 0830) Arterial Line BP: (91-128)/(16-55) 102/43 (09/30 0830) Weight:  [261 lb 7.5 oz (118.6 kg)] 261 lb 7.5 oz (118.6 kg) (09/30 0500)  Hemodynamic parameters for last 24 hours: PAP: (55-61)/(23-29) 61/23 CO:  [5.9 L/min] 5.9 L/min CI:  [2.9 L/min/m2] 2.9 L/min/m2  Intake/Output from previous day: 09/29 0701 - 09/30 0700 In: 659.1 [I.V.:509.1; IV Piggyback:150] Out: 1820 [Urine:1180; Chest Tube:640] Intake/Output this shift: Total I/O In: 24.1 [I.V.:24.1] Out: 40 [Urine:40]  General appearance: cooperative and no distress Neurologic: intact Heart: regular rate and rhythm Lungs: diminished breath sounds bibasilar Abdomen: normal findings: soft, non-tender  Lab Results:  Recent Labs  06/26/16 1703 06/27/16 0347  WBC 11.5* 12.1*  HGB 9.3* 9.1*  HCT 28.9* 28.6*  PLT 126* 121*   BMET:  Recent Labs  06/26/16 0418 06/26/16 1656 06/26/16 1703 06/27/16 0347  NA 136 137  --  136  K 3.5 4.3  --  4.3  CL 104 100*  --  103  CO2 25  --   --  27  GLUCOSE 113* 129*  --  79  BUN 19 21*  --  21*  CREATININE 1.14* 1.10* 1.19* 1.18*  CALCIUM 8.4*  --   --  8.6*    PT/INR:  Recent Labs  06/24/2016 1506  LABPROT 17.1*  INR 1.38   ABG    Component Value Date/Time   PHART 7.337 (L) 06/26/2016 0859   HCO3 23.2 06/26/2016 0859   TCO2 25 06/26/2016 1656   ACIDBASEDEF 3.0 (H) 06/26/2016 0859   O2SAT 74.7 06/27/2016 0434   CBG (last 3)   Recent Labs  06/27/16 0733  06/27/16 0754 06/27/16 0811  GLUCAP 57* 63* 137*    Assessment/Plan: S/P Procedure(s) (LRB): MITRAL VALVE (MV) REPLACEMENT (N/A) TRICUSPID VALVE REPAIR (N/A) MAZE (N/A) TRANSESOPHAGEAL ECHOCARDIOGRAM (TEE) (N/A) -  CV_ still in junctional rhythm, DDD paced  Co-ox= 74%  Start warfarin, decrease ASA to 81 mg  RESP_ left lower lobe atelectasis- IS + flutter valve  RENAL:- creatinine stable  Continue lasix and dopamine today to continue diuresis  DC mediastinal CT  ENDO_ hypoglycemia this AM  Stop levemir  Anemia- stable  Deconditioning- PT consult   LOS: 2 days    Ann Potter 06/27/2016

## 2016-06-28 ENCOUNTER — Inpatient Hospital Stay (HOSPITAL_COMMUNITY): Payer: Medicare Other

## 2016-06-28 LAB — GLUCOSE, CAPILLARY
GLUCOSE-CAPILLARY: 110 mg/dL — AB (ref 65–99)
GLUCOSE-CAPILLARY: 116 mg/dL — AB (ref 65–99)
Glucose-Capillary: 125 mg/dL — ABNORMAL HIGH (ref 65–99)
Glucose-Capillary: 122 mg/dL — ABNORMAL HIGH (ref 65–99)

## 2016-06-28 LAB — CBC
HCT: 27.6 % — ABNORMAL LOW (ref 36.0–46.0)
Hemoglobin: 8.7 g/dL — ABNORMAL LOW (ref 12.0–15.0)
MCH: 31.4 pg (ref 26.0–34.0)
MCHC: 31.5 g/dL (ref 30.0–36.0)
MCV: 99.6 fL (ref 78.0–100.0)
PLATELETS: 151 10*3/uL (ref 150–400)
RBC: 2.77 MIL/uL — ABNORMAL LOW (ref 3.87–5.11)
RDW: 15 % (ref 11.5–15.5)
WBC: 11.3 10*3/uL — AB (ref 4.0–10.5)

## 2016-06-28 LAB — PROTIME-INR
INR: 1.16
PROTHROMBIN TIME: 14.9 s (ref 11.4–15.2)

## 2016-06-28 LAB — BASIC METABOLIC PANEL
ANION GAP: 7 (ref 5–15)
ANION GAP: 10 (ref 5–15)
BUN: 31 mg/dL — ABNORMAL HIGH (ref 6–20)
BUN: 36 mg/dL — ABNORMAL HIGH (ref 6–20)
CHLORIDE: 102 mmol/L (ref 101–111)
CHLORIDE: 99 mmol/L — AB (ref 101–111)
CO2: 26 mmol/L (ref 22–32)
CO2: 23 mmol/L (ref 22–32)
CREATININE: 1.31 mg/dL — AB (ref 0.44–1.00)
CREATININE: 1.41 mg/dL — AB (ref 0.44–1.00)
Calcium: 8.6 mg/dL — ABNORMAL LOW (ref 8.9–10.3)
Calcium: 8.7 mg/dL — ABNORMAL LOW (ref 8.9–10.3)
GFR calc non Af Amer: 38 mL/min — ABNORMAL LOW (ref >60)
GFR calc non Af Amer: 35 mL/min — ABNORMAL LOW (ref >60)
GFR, EST AFRICAN AMERICAN: 45 mL/min — AB (ref >60)
GFR, EST AFRICAN AMERICAN: 41 mL/min — AB (ref >60)
Glucose, Bld: 99 mg/dL (ref 65–99)
Glucose, Bld: 141 mg/dL — ABNORMAL HIGH (ref 65–99)
POTASSIUM: 4.5 mmol/L (ref 3.5–5.1)
Potassium: 4.7 mmol/L (ref 3.5–5.1)
SODIUM: 135 mmol/L (ref 135–145)
Sodium: 132 mmol/L — ABNORMAL LOW (ref 135–145)

## 2016-06-28 LAB — CARBOXYHEMOGLOBIN
CARBOXYHEMOGLOBIN: 1.8 % — AB (ref 0.5–1.5)
Methemoglobin: 1.1 % (ref 0.0–1.5)
O2 SAT: 71.1 %
TOTAL HEMOGLOBIN: 9 g/dL — AB (ref 12.0–16.0)

## 2016-06-28 MED ORDER — METOLAZONE 5 MG PO TABS
5.0000 mg | ORAL_TABLET | Freq: Once | ORAL | Status: AC
  Administered 2016-06-28: 5 mg via ORAL
  Filled 2016-06-28: qty 1

## 2016-06-28 NOTE — Progress Notes (Signed)
      [address]       [place] [id number]             [phone]      Resting  BP (!) 97/41   Pulse 60   Temp 98.7 F (37.1 C) (Oral)   Resp 18   Ht 5' (1.524 m)   Wt 257 lb 0.9 oz (116.6 kg)   SpO2 93%   BMI 50.20 kg/m    Intake/Output Summary (Last 24 hours) at 06/28/16 1958 Last data filed at 06/28/16 1800  Gross per 24 hour  Intake            623.2 ml  Output              835 ml  Net           -211.8 ml   Cr= 1.41 up slightly  Now on low dose neo, still on dopamine @ 3  Samhitha Rosen C. Dorris FetchHendrickson, MD [facility] [phone]

## 2016-06-28 NOTE — Plan of Care (Signed)
Problem: Fluid Volume: Goal: Ability to maintain a balanced intake and output will improve Outcome: Not Progressing Pt is on Lasix gtt  Problem: Nutrition: Goal: Adequate nutrition will be maintained Outcome: Progressing Tolerating advancing diet

## 2016-06-28 NOTE — Evaluation (Signed)
Physical Therapy Evaluation Patient Details Name: Ann Potter MRN: [id number] DOB: 08-Aug-1941 Today's Date: 06/28/2016   History of Present Illness  Patient is a 75 yo female admitted 06/21/2016 due to 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   PMH:  morbid obesity, HOH, poor eyesight, mitral stenosis, tricuspid regurg, rheumatic heart disease, anxiety, Afib, CAD, MI, CHF, venous insufficiency, chronic LE edema, HTN, CKD, asthma, chronic back pain    Clinical Impression  Patient presents with problems listed below.  Will benefit from acute PT to maximize functional mobility prior to discharge.  Recommend SNF at discharge for continued therapy with goal to return home alone.    Follow Up Recommendations SNF;Supervision/Assistance - 24 hour    Equipment Recommendations  None recommended by PT    Recommendations for Other Services       Precautions / Restrictions Precautions Precautions: Sternal;Fall Precaution Comments: External pacer;  Reviewed sternal precautions Restrictions Weight Bearing Restrictions: Yes (Sternal precautions)      Mobility  Bed Mobility Overal bed mobility: Needs Assistance;+2 for physical assistance Bed Mobility: Supine to Sit     Supine to sit: Max assist;+2 for physical assistance;HOB elevated     General bed mobility comments: Verbal cues for technique to maintain sternal precautions.  Assist to raise trunk to sitting position, and scoot to EOB in sitting.  Transfers Overall transfer level: Needs assistance Equipment used: 2 person hand held assist Transfers: Sit to/from [employer] Sit to Stand: Mod assist;+2 physical assistance Stand pivot transfers: Mod assist;+2 physical assistance       General transfer comment: Verbal cues for technique to maintain sternal precautions.  Patient stood x3 with +2 mod assist to power up to standing.  On first 2 attempts, patient  stood x 20 seconds each and had to sit back onto bed - LE weakness and nausea.  On third attempt, patient able to stand, and take several shuffle steps to pivot to chair.  Patient very short and unable to scoot self back into chair.  Attempted to assist patient with pad in chair - unable.  Required +3 assist to attempt to move patient's hips back into chair.    Ambulation/Gait             General Gait Details: NT  Stairs            Wheelchair Mobility    Modified Rankin (Stroke Patients Only)       Balance Overall balance assessment: Needs assistance         Standing balance support: Bilateral upper extremity supported Standing balance-Leahy Scale: Poor                               Pertinent Vitals/Pain Pain Assessment: No/denies pain    Home Living Family/patient expects to be discharged to:: Skilled nursing facility Living Arrangements: Alone             Home Equipment: Walker - 2 wheels;Bedside commode;Shower seat Additional Comments: Daughter lives nearby and is supportive    Prior Function Level of Independence: Independent with assistive device(s);Needs assistance   Gait / Transfers Assistance Needed: Ambulates with RW  ADL's / Homemaking Assistance Needed: Assist for housekeeping        Hand Dominance        Extremity/Trunk Assessment   Upper Extremity Assessment: Generalized weakness (Did not MMT due to sternal precautions)  Lower Extremity Assessment: Generalized weakness         Communication   Communication: HOH (Hears best in Right ear)  Cognition Arousal/Alertness: Awake/alert Behavior During Therapy: WFL for tasks assessed/performed;Flat affect;Anxious Overall Cognitive Status: Within Functional Limits for tasks assessed                      General Comments      Exercises     Assessment/Plan    PT Assessment Patient needs continued PT services  PT Problem List Decreased  strength;Decreased activity tolerance;Decreased balance;Decreased mobility;Decreased knowledge of use of DME;Decreased knowledge of precautions;Cardiopulmonary status limiting activity;Obesity          PT Treatment Interventions DME instruction;Gait training;Functional mobility training;Therapeutic activities;Therapeutic exercise;Patient/family education    PT Goals (Current goals can be found in the Care Plan section)  Acute Rehab PT Goals Patient Stated Goal: To get stronger PT Goal Formulation: With patient Time For Goal Achievement: 07/12/16 Potential to Achieve Goals: Good    Frequency Min 3X/week   Barriers to discharge Decreased caregiver support Patient lives alone    Co-evaluation               End of Session Equipment Utilized During Treatment: Oxygen Activity Tolerance: Patient limited by fatigue Patient left: in chair;with call bell/phone within reach;with nursing/sitter in room Nurse Communication: Mobility status;Need for lift equipment (RN in room to assist with transfer; Lift equip to scoot back)         Time: 1325-1355 PT Time Calculation (min) (ACUTE ONLY): 30 min   Charges:   PT Evaluation $PT Eval High Complexity: 1 Procedure PT Treatments $Therapeutic Activity: 8-22 mins   PT G Codes:        Vena AustriaDavis, Deshia Vanderhoof H 06/28/2016, 3:58 PM Durenda HurtSusan H. Renaldo Fiddleravis, PT, [facility] Acute Rehab Services Pager [phone]

## 2016-06-28 NOTE — Progress Notes (Signed)
3 Days Post-Op Procedure(s) (LRB): MITRAL VALVE (MV) REPLACEMENT (N/A) TRICUSPID VALVE REPAIR (N/A) MAZE (N/A) TRANSESOPHAGEAL ECHOCARDIOGRAM (TEE) (N/A) Subjective: Some pain.  Objective: Vital signs in last 24 hours: Temp:  [97.6 F (36.4 C)-98.3 F (36.8 C)] 98.3 F (36.8 C) (10/01 0740) Pulse Rate:  [60-81] 79 (10/01 0800) Cardiac Rhythm: A-V Sequential paced (10/01 0800) Resp:  [13-25] 18 (10/01 0800) BP: (83-116)/(37-79) 92/51 (10/01 0800) SpO2:  [87 %-100 %] 99 % (10/01 0800) Arterial Line BP: (95-159)/(42-67) 116/53 (10/01 0800) Weight:  [257 lb 0.9 oz (116.6 kg)] 257 lb 0.9 oz (116.6 kg) (10/01 0600)  Hemodynamic parameters for last 24 hours:    Intake/Output from previous day: 09/30 0701 - 10/01 0700 In: 1008.4 [P.O.:300; I.V.:658.4; IV Piggyback:50] Out: 920 [Urine:700; Chest Tube:220] Intake/Output this shift: Total I/O In: -  Out: 70 [Urine:40; Chest Tube:30]  General appearance: alert, cooperative and no distress Neurologic: intact Heart: regular rate and rhythm Lungs: diminished breath sounds bibasilar Abdomen: obese, soft, nontender Extremities: edema 4+  Lab Results:  Recent Labs  06/27/16 0347 06/28/16 0355  WBC 12.1* 11.3*  HGB 9.1* 8.7*  HCT 28.6* 27.6*  PLT 121* 151   BMET:  Recent Labs  06/27/16 0347 06/28/16 0355  NA 136 135  K 4.3 4.5  CL 103 102  CO2 27 26  GLUCOSE 79 99  BUN 21* 31*  CREATININE 1.18* 1.31*  CALCIUM 8.6* 8.6*    PT/INR:  Recent Labs  06/28/16 0355  LABPROT 14.9  INR 1.16   ABG    Component Value Date/Time   PHART 7.337 (L) 06/26/2016 0859   HCO3 23.2 06/26/2016 0859   TCO2 25 06/26/2016 1656   ACIDBASEDEF 3.0 (H) 06/26/2016 0859   O2SAT 71.1 06/28/2016 0350   CBG (last 3)   Recent Labs  06/27/16 1905 06/27/16 2202 06/28/16 0736  GLUCAP 81 90 110*    Assessment/Plan: S/P Procedure(s) (LRB): MITRAL VALVE (MV) REPLACEMENT (N/A) TRICUSPID VALVE REPAIR (N/A) MAZE (N/A) TRANSESOPHAGEAL  ECHOCARDIOGRAM (TEE) (N/A) -Slow progress postop CV- s/p MVR, TVR, maze- remains in junctional rhythm, DD paced for rate  On warfarin, INR has not bumped yet  Co-ox= 71  Continue dopamine today  RESP_ LLL atelectasis- IS  RENAL- creatinine up slightly to 1.3 continue dopamine  Volume overload- increase lasix drip to 10, give zaroxylyn x 1  ENDO- CBG well controlled  SCD + enoxaparin for DVT prophylaxis until INR up  Mobilize as tolerated, PT consult placed   LOS: 3 days    Loreli SlotSteven C Alvaretta Eisenberger 06/28/2016

## 2016-06-28 DEATH — deceased

## 2016-06-29 ENCOUNTER — Inpatient Hospital Stay (HOSPITAL_COMMUNITY): Payer: Medicare Other

## 2016-06-29 LAB — CBC
HCT: 25.7 % — ABNORMAL LOW (ref 36.0–46.0)
HEMOGLOBIN: 8.2 g/dL — AB (ref 12.0–15.0)
MCH: 31.5 pg (ref 26.0–34.0)
MCHC: 31.9 g/dL (ref 30.0–36.0)
MCV: 98.8 fL (ref 78.0–100.0)
PLATELETS: 154 10*3/uL (ref 150–400)
RBC: 2.6 MIL/uL — AB (ref 3.87–5.11)
RDW: 15.3 % (ref 11.5–15.5)
WBC: 8.4 10*3/uL (ref 4.0–10.5)

## 2016-06-29 LAB — PROTIME-INR
INR: 1.17
Prothrombin Time: 15 seconds (ref 11.4–15.2)

## 2016-06-29 LAB — GLUCOSE, CAPILLARY
GLUCOSE-CAPILLARY: 119 mg/dL — AB (ref 65–99)
GLUCOSE-CAPILLARY: 126 mg/dL — AB (ref 65–99)
Glucose-Capillary: 117 mg/dL — ABNORMAL HIGH (ref 65–99)
Glucose-Capillary: 131 mg/dL — ABNORMAL HIGH (ref 65–99)

## 2016-06-29 LAB — BASIC METABOLIC PANEL
Anion gap: 10 (ref 5–15)
BUN: 41 mg/dL — ABNORMAL HIGH (ref 6–20)
CALCIUM: 8.7 mg/dL — AB (ref 8.9–10.3)
CO2: 24 mmol/L (ref 22–32)
CREATININE: 1.44 mg/dL — AB (ref 0.44–1.00)
Chloride: 98 mmol/L — ABNORMAL LOW (ref 101–111)
GFR calc non Af Amer: 34 mL/min — ABNORMAL LOW (ref >60)
GFR, EST AFRICAN AMERICAN: 40 mL/min — AB (ref >60)
Glucose, Bld: 123 mg/dL — ABNORMAL HIGH (ref 65–99)
POTASSIUM: 3.3 mmol/L — AB (ref 3.5–5.1)
SODIUM: 132 mmol/L — AB (ref 135–145)

## 2016-06-29 LAB — CARBOXYHEMOGLOBIN
Carboxyhemoglobin: 2 % — ABNORMAL HIGH (ref 0.5–1.5)
Methemoglobin: 1 % (ref 0.0–1.5)
O2 Saturation: 68.5 %
TOTAL HEMOGLOBIN: 8 g/dL — AB (ref 12.0–16.0)

## 2016-06-29 MED ORDER — SODIUM CHLORIDE 0.9% FLUSH: 10.0000 mL | INTRAVENOUS | Status: DC | PRN

## 2016-06-29 MED ORDER — POLYSACCHARIDE IRON COMPLEX 150 MG PO CAPS
150.0000 mg | ORAL_CAPSULE | Freq: Every day | ORAL | Status: DC
  Administered 2016-06-29 – 2016-07-06 (×7): 150 mg via ORAL
  Filled 2016-06-29 (×10): qty 1

## 2016-06-29 MED ORDER — ROSUVASTATIN CALCIUM 10 MG PO TABS
10.0000 mg | ORAL_TABLET | Freq: Every day | ORAL | Status: DC
  Administered 2016-06-29 – 2016-07-07 (×8): 10 mg via ORAL
  Filled 2016-06-29 (×8): qty 1

## 2016-06-29 MED ORDER — POTASSIUM CHLORIDE 10 MEQ/50ML IV SOLN
10.0000 meq | INTRAVENOUS | Status: AC
  Administered 2016-06-29 (×3): 10 meq via INTRAVENOUS
  Filled 2016-06-29: qty 50

## 2016-06-29 MED ORDER — POTASSIUM CHLORIDE 10 MEQ/50ML IV SOLN
10.0000 meq | INTRAVENOUS | Status: AC
  Administered 2016-06-29 (×3): 10 meq via INTRAVENOUS
  Filled 2016-06-29 (×3): qty 50

## 2016-06-29 MED ORDER — METOLAZONE 5 MG PO TABS
5.0000 mg | ORAL_TABLET | Freq: Once | ORAL | Status: AC
  Administered 2016-06-29: 5 mg via ORAL
  Filled 2016-06-29: qty 1

## 2016-06-29 MED ORDER — FA-PYRIDOXINE-CYANOCOBALAMIN 2.5-25-2 MG PO TABS
1.0000 | ORAL_TABLET | Freq: Every day | ORAL | Status: DC
  Administered 2016-06-29 – 2016-07-07 (×8): 1 via ORAL
  Filled 2016-06-29 (×11): qty 1

## 2016-06-29 NOTE — Progress Notes (Signed)
Physical Therapy Treatment Patient Details Name: Ann Potter MRN: [id number] DOB: October 17, 1940 Today's Date: 06/29/2016    History of Present Illness Patient is a 75 yo female admitted 05/31/2016 due to 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   PMH:  morbid obesity, HOH, poor eyesight, mitral stenosis, tricuspid regurg, rheumatic heart disease, anxiety, Afib, CAD, MI, CHF, venous insufficiency, chronic LE edema, HTN, CKD, asthma, chronic back pain    PT Comments    Pt pleasant and unaware of sternal precautions. After education she was able to state 3/5. Pt with body habitus compounding her generalized weakness and sternal precautions making mobility difficult. Pt was able to stand and take a few steps away from bed today to chair. Pt educated for bil LE HEP but only able to tolerate limited amount. Will continue to follow to maximize function.   110/73 before 136/65 after activity HR 68-70 sats 90-94% RA  Follow Up Recommendations  SNF;Supervision/Assistance - 24 hour     Equipment Recommendations       Recommendations for Other Services       Precautions / Restrictions Precautions Precautions: Sternal;Fall Precaution Comments: External pacer;  Reviewed sternal precautions    Mobility  Bed Mobility Overal bed mobility: Needs Assistance;+2 for physical assistance Bed Mobility: Rolling;Sidelying to Sit Rolling: Mod assist;+2 for physical assistance;+2 for safety/equipment Sidelying to sit: Mod assist;+2 for physical assistance;+2 for safety/equipment       General bed mobility comments: multimodal cues for technique, sequence and safety pt with difficulty following commands to roll and assist to elevate trunk as well as scoot fully to EOB with use of pad and reciprocal scooting  Transfers Overall transfer level: Needs assistance   Transfers: Sit to/from Stand Sit to Stand: Mod assist;+2 physical assistance          General transfer comment: cues for hand placement, sequence and safety with assist for anterior translation and rise from bed, assist to control descent to chair. Once in chair attempted reciprocal scooting with 2 person assist and pad with little success and positioned pt with pillows in chair and legs elevated  Ambulation/Gait Ambulation/Gait assistance: Mod assist;+2 safety/equipment Ambulation Distance (Feet): 5 Feet Assistive device: Rolling walker (2 wheeled) Gait Pattern/deviations: Trunk flexed;Shuffle   Gait velocity interpretation: Below normal speed for age/gender General Gait Details: cues for posture and safety with pt maintaining flexed posture, chair to follow as pt with poor activity tolerance and does not notify therapist of fatigue   Stairs            Wheelchair Mobility    Modified Rankin (Stroke Patients Only)       Balance Overall balance assessment: Needs assistance   Sitting balance-Leahy Scale: Fair       Standing balance-Leahy Scale: Poor                      Cognition Arousal/Alertness: Awake/alert Behavior During Therapy: WFL for tasks assessed/performed         Memory: Decreased recall of precautions              Exercises General Exercises - Lower Extremity Heel Slides: AAROM;Both;10 reps;Supine    General Comments        Pertinent Vitals/Pain Pain Assessment: No/denies pain    Home Living                      Prior Function  PT Goals (current goals can now be found in the care plan section) Progress towards PT goals: Progressing toward goals    Frequency           PT Plan Current plan remains appropriate    Co-evaluation             End of Session Equipment Utilized During Treatment: Gait belt Activity Tolerance: Patient tolerated treatment well Patient left: in chair;with call bell/phone within reach     Time: 1101-1124 PT Time Calculation (min) (ACUTE  ONLY): 23 min  Charges:  $Therapeutic Exercise: 8-22 mins $Therapeutic Activity: 8-22 mins                    G Codes:      Delorse Lekabor, Shailee Foots Beth 06/29/2016, 1:16 PM Delaney MeigsMaija Tabor Burel Kahre, PT [phone]

## 2016-06-29 NOTE — Progress Notes (Signed)
Patient ID: [name], female   DOB: 25-Dec-1940, 75 y.o.   MRN: [id number] EVENING ROUNDS NOTE :     [address]       [place] [id number]             [phone]                 4 Days Post-Op Procedure(s) (LRB): MITRAL VALVE (MV) REPLACEMENT (N/A) TRICUSPID VALVE REPAIR (N/A) MAZE (N/A) TRANSESOPHAGEAL ECHOCARDIOGRAM (TEE) (N/A)  Total Length of Stay:  LOS: 4 days  BP (!) 111/54   Pulse 78   Temp 97.8 F (36.6 C) (Oral)   Resp 19   Ht 5' (1.524 m)   Wt 257 lb 4.4 oz (116.7 kg)   SpO2 96%   BMI 50.25 kg/m   .Intake/Output      10/02 0701 - 10/03 0700   P.O. 60   I.V. (mL/kg) 209.3 (1.8)   IV Piggyback 150   Total Intake(mL/kg) 419.3 (3.6)   Urine (mL/kg/hr) 1330 (0.9)   Chest Tube    Total Output 1330   Net -910.7         . sodium chloride Stopped (06/26/16 0600)  . DOPamine 3 mcg/kg/min (06/29/16 2000)  . furosemide (LASIX) infusion 10 mg/hr (06/29/16 2000)  . phenylephrine (NEO-SYNEPHRINE) Adult infusion Stopped (06/29/16 0600)     Lab Results  Component Value Date   WBC 8.4 06/29/2016   HGB 8.2 (L) 06/29/2016   HCT 25.7 (L) 06/29/2016   PLT 154 06/29/2016   GLUCOSE 123 (H) 06/29/2016   ALT 12 (L) 06/22/2016   AST 23 06/22/2016   NA 132 (L) 06/29/2016   K 3.3 (L) 06/29/2016   CL 98 (L) 06/29/2016   CREATININE 1.44 (H) 06/29/2016   BUN 41 (H) 06/29/2016   CO2 24 06/29/2016   INR 1.17 06/29/2016   HGBA1C 5.3 06/22/2016   Stable day, getting pic line placed    Delight OvensEdward B Labrian Torregrossa MD  Beeper [phone] Office [phone] 06/29/2016 8:04 PM

## 2016-06-29 NOTE — Addendum Note (Signed)
Addendum  created 06/29/16 1349 by Adair LaundryLynn A Karie Skowron, CRNA   Anesthesia Intra Meds edited

## 2016-06-29 NOTE — Care Management Important Message (Signed)
Important Message  Patient Details  Name: Ann Potter MRN: [id number] Date of Birth: 08-Sep-1941   Medicare Important Message Given:  Yes    [name] 06/29/2016, 12:16 PM

## 2016-06-29 NOTE — Progress Notes (Signed)
Peripherally Inserted Central Catheter/Midline Placement  The IV Nurse has discussed with the patient and/or persons authorized to consent for the patient, the purpose of this procedure and the potential benefits and risks involved with this procedure.  The benefits include less needle sticks, lab draws from the catheter, and the patient may be discharged home with the catheter. Risks include, but not limited to, infection, bleeding, blood clot (thrombus formation), and puncture of an artery; nerve damage and irregular heartbeat and possibility to perform a PICC exchange if needed/ordered by physician.  Alternatives to this procedure were also discussed.  Bard Power PICC patient education guide, fact sheet on infection prevention and patient information card has been provided to patient /or left at bedside.    PICC/Midline Placement Documentation  PICC Triple Lumen 06/29/16 PICC Right Cephalic 43 cm 0 cm (Active)  Indication for Insertion or Continuance of Line Vasoactive infusions 06/29/2016  8:25 PM  Exposed Catheter (cm) 0 cm 06/29/2016  8:25 PM  Site Assessment Clean;Dry;Intact 06/29/2016  8:25 PM  Lumen #1 Status Blood return noted;Flushed;Saline locked 06/29/2016  8:25 PM  Lumen #2 Status Blood return noted;Flushed;Saline locked 06/29/2016  8:25 PM  Lumen #3 Status Blood return noted;Flushed;Saline locked 06/29/2016  8:25 PM  Dressing Type Transparent 06/29/2016  8:25 PM  Dressing Status Clean;Dry;Intact;Antimicrobial disc in place 06/29/2016  8:25 PM  Dressing Intervention New dressing 06/29/2016  8:25 PM  Dressing Change Due 07/06/16 06/29/2016  8:25 PM       Netta Corriganhomas, Alison Kubicki L 06/29/2016, 8:37 PM

## 2016-06-29 NOTE — Progress Notes (Signed)
[address]       [name] [id number]             [phone]        CARDIOTHORACIC SURGERY PROGRESS NOTE   R4 Days Post-Op Procedure(s) (LRB): MITRAL VALVE (MV) REPLACEMENT (N/A) TRICUSPID VALVE REPAIR (N/A) MAZE (N/A) TRANSESOPHAGEAL ECHOCARDIOGRAM (TEE) (N/A)  Subjective: No complaints other than pain in back which is chronic.  Eating some.  Denies SOB.  Extremely deconditioned - needs 3 person assist to get out of bed  Objective: Vital signs: BP Readings from Last 1 Encounters:  06/29/16 (!) 93/57   Pulse Readings from Last 1 Encounters:  06/29/16 68   Resp Readings from Last 1 Encounters:  06/29/16 16   Temp Readings from Last 1 Encounters:  06/29/16 98.2 F (36.8 C) (Oral)    Hemodynamics:    Physical Exam:  Rhythm:   Junctional w/ HR 60-70  Breath sounds: clear  Heart sounds:  RRR  Incisions:  Clean and dry  Abdomen:  Soft, non-distended, non-tender  Extremities:  Warm, well-perfused, swollen   Intake/Output from previous day: 10/01 0701 - 10/02 0700 In: 746.5 [I.V.:596.5; IV Piggyback:150] Out: 1605 [Urine:1575; Chest Tube:30] Intake/Output this shift: Total I/O In: -  Out: 80 [Urine:80]  Lab Results:  CBC: Recent Labs  06/28/16 0355 06/29/16 0345  WBC 11.3* 8.4  HGB 8.7* 8.2*  HCT 27.6* 25.7*  PLT 151 154    BMET:  Recent Labs  06/28/16 1600 06/29/16 0345  NA 132* 132*  K 4.7 3.3*  CL 99* 98*  CO2 23 24  GLUCOSE 141* 123*  BUN 36* 41*  CREATININE 1.41* 1.44*  CALCIUM 8.7* 8.7*     PT/INR:   Recent Labs  06/29/16 0345  LABPROT 15.0  INR 1.17    CBG (last 3)   Recent Labs  06/28/16 1750 06/28/16 2143 06/29/16 0728  GLUCAP 122* 116* 117*    ABG    Component Value Date/Time   PHART 7.337 (L) 06/26/2016 0859   PCO2ART 43.2 06/26/2016 0859   PO2ART 90.0 06/26/2016 0859   HCO3 23.2 06/26/2016 0859   TCO2 25 06/26/2016 1656   ACIDBASEDEF 3.0 (H) 06/26/2016 0859   O2SAT 68.5 06/29/2016  0340    CXR: PORTABLE CHEST 1 VIEW  COMPARISON:  06/28/2016.  FINDINGS: Interim removal of bilateral chest tubes. Right IJ sheath in stable position. Prior median sternotomy and cardiac valve replacements. Left atrial appendage clip noted stable position. Cardiomegaly. Interim increase in interstitial markings suggesting pulmonary interstitial edema. Low lung volumes with bibasilar atelectasis and/or infiltrates. Low lung volumes. Bilateral pleural effusions, left side greater than right. No pneumothorax.  IMPRESSION: 1. Interim removal of bilateral chest tubes.  No pneumothorax.  2. Prior cardiac valve replacements. Cardiomegaly. Increased interstitial prominence bilateral consistent with congestive heart failure with pulmonary interstitial edema. Bilateral pleural effusions, left side greater right again noted. Low lung volumes.   Electronically Signed   By: Maisie Fushomas  Register   On: 06/29/2016 07:31  Assessment/Plan: S/P Procedure(s) (LRB): MITRAL VALVE (MV) REPLACEMENT (N/A) TRICUSPID VALVE REPAIR (N/A) MAZE (N/A) TRANSESOPHAGEAL ECHOCARDIOGRAM (TEE) (N/A)  Overall stable now POD4 Maintaining junctional rhythm w/ stable HR and BP, still on dopamine @ 3 mcg/kg/min but off Neo Acute on chronic diastolic CHF with expected post-op volume excess, weight still 18 lbs above preop baseline - starting to diurese better Longstanding rheumatic heart disease  Pulmonary hypertension Permanent atrial fibrillation now s/p maze procedure - no recurrence so  far Chronic kidney disease stage III, creatinine stable and UOP improved Expected post op acute blood loss anemia, Hgb down slightly 8.2 this morning Expected post op atelectasis, mild Morbid obesity Physical deconditioning Chronic venous insufficiency   Insert PICC line and d/c old central line  Wean dopamine slowly as BP will allow  Continue lasix drip for now and leave foley catheter in place  Mobilize as much as  possible  Coumadin  Low dose lovenox until INR increases  Purcell Nails, MD 06/29/2016 8:08 AM

## 2016-06-29 NOTE — NC FL2 (Signed)
[place] MEDICAID FL2 LEVEL OF CARE SCREENING TOOL     IDENTIFICATION  Patient Name: Ann Potter Birthdate: [birth year] Sex: female Admission Date (Current Location): 06/18/2016  [facility] and [place] Number:  Producer, television/film/video and Address:  [facility], [address] [place], [place] [address]      Provider Number: [id number]  Attending Physician Name and Address:  Purcell Nails, MD  Relative Name and Phone Number:       Current Level of Care: Hospital Recommended Level of Care: Skilled Nursing Facility Prior Approval Number:    Date Approved/Denied:   PASRR Number:    Discharge Plan: SNF    Current Diagnoses: Patient Active Problem List   Diagnosis Date Noted  . 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 S/P tricuspid valve repair 06/18/2016  . S/P Maze operation for atrial fibrillation 06/23/2016  . Tricuspid regurgitation   . Rheumatic heart disease   . Rheumatic fever during childhood   . Chronic venous insufficiency   . Morbid obesity (HCC) 05/01/2016  . Valvular heart disease   . Chronic atrial fibrillation (HCC)   . Visual field defect 04/08/2012  . Atrial fibrillation, chronic (HCC) 01/25/2012  . Mitral stenosis 01/25/2012  . CAD (coronary artery disease) 01/25/2012  . Pulmonary hypertension (HCC) 01/25/2012  . Chronic diastolic congestive heart failure (HCC) 01/25/2012  . Lower extremity edema 01/25/2012  . HTN (hypertension) 01/25/2012  . Chronic renal insufficiency, stage III (moderate) 01/25/2012  . Long term (current) use of anticoagulants 01/25/2012    Orientation RESPIRATION BLADDER Height & Weight     Self, Time, Situation, Place  O2 (2L/per minute) Indwelling catheter Weight: 257 lb 4.4 oz (116.7 kg) Height:  5' (152.4 cm)  BEHAVIORAL SYMPTOMS/MOOD NEUROLOGICAL BOWEL NUTRITION STATUS   (none)  (none) Continent Diet (heart healthy/carb  modified)  AMBULATORY STATUS COMMUNICATION OF NEEDS Skin   Extensive Assist Verbally Surgical wounds (chest; daily dressing changes; gauze)                       Personal Care Assistance Level of Assistance  Bathing, Feeding, Dressing Bathing Assistance: Limited assistance Feeding assistance: Independent Dressing Assistance: Limited assistance     Functional Limitations Info  Hearing, Sight, Speech Sight Info: Adequate Hearing Info: Impaired Speech Info: Adequate    SPECIAL CARE FACTORS FREQUENCY  PT (By licensed PT), OT (By licensed OT)     PT Frequency: 5x/week OT Frequency: 5x/week            Contractures Contractures Info: Not present    Additional Factors Info  Code Status, Allergies, Insulin Sliding Scale Code Status Info: Full Allergies Info:  Relafen Nabumetone   Insulin Sliding Scale Info: insulin aspart (novoLOG) injection 0-15 Units Dose: 0-15 Units Freq: 3 times daily with meals Route: [place]       Current Medications (06/29/2016):  This is the current hospital active medication list Current Facility-Administered Medications  Medication Dose Route Frequency Provider Last Rate Last Dose  . 0.9 %  sodium chloride infusion  250 mL Intravenous Continuous Purcell Nails, MD   Stopped at 06/26/16 0600  . acetaminophen (TYLENOL) tablet 1,000 mg  1,000 mg Oral Q6H Purcell Nails, MD   1,000 mg at 06/29/16 1139  . aspirin EC tablet 81 mg  81 mg Oral Daily Loreli Slot, MD   81 mg at 06/29/16 0904  . bisacodyl (DULCOLAX)  EC tablet 10 mg  10 mg Oral Daily Purcell Nails, MD   10 mg at 06/29/16 1610   Or  . bisacodyl (DULCOLAX) suppository 10 mg  10 mg Rectal Daily Purcell Nails, MD      . docusate sodium (COLACE) capsule 200 mg  200 mg Oral Daily Purcell Nails, MD   200 mg at 06/29/16 0904  . DOPamine (INTROPIN) 800 mg in dextrose 5 % 250 mL (3.2 mg/mL) infusion  3 mcg/kg/min Intravenous Continuous Purcell Nails, MD 6.1 mL/hr at 06/29/16 0905 3  mcg/kg/min at 06/29/16 0905  . enoxaparin (LOVENOX) injection 30 mg  30 mg Subcutaneous Q24H Purcell Nails, MD   30 mg at 06/28/16 1711  . folic acid-pyridoxine-cyancobalamin (FOLTX) 2.5-25-2 MG per tablet 1 tablet  1 tablet Oral Daily Purcell Nails, MD   1 tablet at 06/29/16 0904  . furosemide (LASIX) 250 mg in dextrose 5 % 250 mL (1 mg/mL) infusion  10 mg/hr Intravenous Continuous Loreli Slot, MD 10 mL/hr at 06/29/16 0250 10 mg/hr at 06/29/16 0250  . insulin aspart (novoLOG) injection 0-15 Units  0-15 Units Subcutaneous TID WC Loreli Slot, MD   2 Units at 06/29/16 1139  . insulin aspart (novoLOG) injection 0-5 Units  0-5 Units Subcutaneous QHS Loreli Slot, MD      . ipratropium-albuterol (DUONEB) 0.5-2.5 (3) MG/3ML nebulizer solution 3 mL  3 mL Nebulization Q6H PRN Purcell Nails, MD      . iron polysaccharides (NIFEREX) capsule 150 mg  150 mg Oral Daily Purcell Nails, MD   150 mg at 06/29/16 0903  . levothyroxine (SYNTHROID, LEVOTHROID) tablet 50 mcg  50 mcg Oral QAC breakfast Purcell Nails, MD   50 mcg at 06/29/16 9604  . metoprolol (LOPRESSOR) injection 2.5-5 mg  2.5-5 mg Intravenous Q2H PRN Purcell Nails, MD      . montelukast (SINGULAIR) tablet 10 mg  10 mg Oral Daily Purcell Nails, MD   10 mg at 06/29/16 0904  . morphine 2 MG/ML injection 1-2 mg  1-2 mg Intravenous Q1H PRN Purcell Nails, MD   2 mg at 06/28/16 1100  . ondansetron (ZOFRAN) injection 4 mg  4 mg Intravenous Q6H PRN Purcell Nails, MD   4 mg at 06/26/16 1355  . oxyCODONE (Oxy IR/ROXICODONE) immediate release tablet 5-10 mg  5-10 mg Oral Q3H PRN Purcell Nails, MD   5 mg at 06/29/16 0748  . pantoprazole (PROTONIX) EC tablet 40 mg  40 mg Oral Daily Purcell Nails, MD   40 mg at 06/29/16 0904  . phenylephrine (NEO-SYNEPHRINE) 20 mg in dextrose 5 % 250 mL (0.08 mg/mL) infusion  0-100 mcg/min Intravenous Titrated Purcell Nails, MD   Stopped at 06/29/16 0600  . rosuvastatin (CRESTOR) tablet  10 mg  10 mg Oral Daily Purcell Nails, MD   10 mg at 06/29/16 0904  . sodium chloride flush (NS) 0.9 % injection 3 mL  3 mL Intravenous Q12H Purcell Nails, MD   10 mL at 06/28/16 2200  . sodium chloride flush (NS) 0.9 % injection 3 mL  3 mL Intravenous PRN Purcell Nails, MD      . traMADol Janean Sark) tablet 50-100 mg  50-100 mg Oral Q4H PRN Purcell Nails, MD   50 mg at 06/26/16 1616  . warfarin (COUMADIN) tablet 2.5 mg  2.5 mg Oral q1800 Loreli Slot, MD   2.5 mg at  06/28/16 1710  . Warfarin - Physician Dosing Inpatient   Does not apply [id number] Purcell Nailslarence H Owen, MD         Discharge Medications: Please see discharge summary for a list of discharge medications.  Relevant Imaging Results:  Relevant Lab Results:   Additional Information SSN [id number]  Reggy EyeLaShonda A Aariah Godette, LCSW

## 2016-06-30 ENCOUNTER — Inpatient Hospital Stay (HOSPITAL_COMMUNITY): Payer: Medicare Other

## 2016-06-30 LAB — CBC
HCT: 25.7 % — ABNORMAL LOW (ref 36.0–46.0)
HEMOGLOBIN: 8.3 g/dL — AB (ref 12.0–15.0)
MCH: 31.6 pg (ref 26.0–34.0)
MCHC: 32.3 g/dL (ref 30.0–36.0)
MCV: 97.7 fL (ref 78.0–100.0)
PLATELETS: 176 10*3/uL (ref 150–400)
RBC: 2.63 MIL/uL — AB (ref 3.87–5.11)
RDW: 15.4 % (ref 11.5–15.5)
WBC: 6.3 10*3/uL (ref 4.0–10.5)

## 2016-06-30 LAB — COMPREHENSIVE METABOLIC PANEL
ALK PHOS: 70 U/L (ref 38–126)
ALT: 17 U/L (ref 14–54)
ANION GAP: 10 (ref 5–15)
AST: 26 U/L (ref 15–41)
Albumin: 2.3 g/dL — ABNORMAL LOW (ref 3.5–5.0)
BUN: 45 mg/dL — ABNORMAL HIGH (ref 6–20)
CALCIUM: 8.9 mg/dL (ref 8.9–10.3)
CO2: 30 mmol/L (ref 22–32)
CREATININE: 1.41 mg/dL — AB (ref 0.44–1.00)
Chloride: 93 mmol/L — ABNORMAL LOW (ref 101–111)
GFR, EST AFRICAN AMERICAN: 41 mL/min — AB (ref >60)
GFR, EST NON AFRICAN AMERICAN: 35 mL/min — AB (ref >60)
Glucose, Bld: 151 mg/dL — ABNORMAL HIGH (ref 65–99)
Potassium: 2.4 mmol/L — CL (ref 3.5–5.1)
Sodium: 133 mmol/L — ABNORMAL LOW (ref 135–145)
TOTAL PROTEIN: 4.9 g/dL — AB (ref 6.5–8.1)
Total Bilirubin: 1.5 mg/dL — ABNORMAL HIGH (ref 0.3–1.2)

## 2016-06-30 LAB — GLUCOSE, CAPILLARY
GLUCOSE-CAPILLARY: 118 mg/dL — AB (ref 65–99)
GLUCOSE-CAPILLARY: 123 mg/dL — AB (ref 65–99)
Glucose-Capillary: 111 mg/dL — ABNORMAL HIGH (ref 65–99)
Glucose-Capillary: 115 mg/dL — ABNORMAL HIGH (ref 65–99)

## 2016-06-30 LAB — BASIC METABOLIC PANEL
Anion gap: 9 (ref 5–15)
BUN: 48 mg/dL — AB (ref 6–20)
CO2: 30 mmol/L (ref 22–32)
CREATININE: 1.35 mg/dL — AB (ref 0.44–1.00)
Calcium: 8.6 mg/dL — ABNORMAL LOW (ref 8.9–10.3)
Chloride: 95 mmol/L — ABNORMAL LOW (ref 101–111)
GFR, EST AFRICAN AMERICAN: 43 mL/min — AB (ref >60)
GFR, EST NON AFRICAN AMERICAN: 37 mL/min — AB (ref >60)
Glucose, Bld: 118 mg/dL — ABNORMAL HIGH (ref 65–99)
Potassium: 3 mmol/L — ABNORMAL LOW (ref 3.5–5.1)
SODIUM: 134 mmol/L — AB (ref 135–145)

## 2016-06-30 LAB — PREALBUMIN: PREALBUMIN: 4.6 mg/dL — AB (ref 18–38)

## 2016-06-30 LAB — PROTIME-INR
INR: 1.21
Prothrombin Time: 15.4 seconds — ABNORMAL HIGH (ref 11.4–15.2)

## 2016-06-30 LAB — CARBOXYHEMOGLOBIN
CARBOXYHEMOGLOBIN: 2.5 % — AB (ref 0.5–1.5)
METHEMOGLOBIN: 0.1 % (ref 0.0–1.5)
O2 SAT: 62.8 %
Total hemoglobin: 8.4 g/dL — ABNORMAL LOW (ref 12.0–16.0)

## 2016-06-30 MED ORDER — GLUCERNA SHAKE PO LIQD
237.0000 mL | Freq: Three times a day (TID) | ORAL | Status: DC
  Administered 2016-06-30 – 2016-07-01 (×2): 237 mL via ORAL

## 2016-06-30 MED ORDER — POTASSIUM CHLORIDE 10 MEQ/50ML IV SOLN
10.0000 meq | INTRAVENOUS | Status: AC
  Administered 2016-06-30 (×3): 10 meq via INTRAVENOUS
  Filled 2016-06-30 (×3): qty 50

## 2016-06-30 MED ORDER — SPIRONOLACTONE 25 MG PO TABS
25.0000 mg | ORAL_TABLET | Freq: Every day | ORAL | Status: DC
  Administered 2016-06-30 – 2016-07-04 (×5): 25 mg via ORAL
  Filled 2016-06-30 (×5): qty 1

## 2016-06-30 MED ORDER — POTASSIUM CHLORIDE 10 MEQ/50ML IV SOLN
10.0000 meq | INTRAVENOUS | Status: AC
  Administered 2016-06-30 (×4): 10 meq via INTRAVENOUS

## 2016-06-30 MED ORDER — POTASSIUM CHLORIDE 10 MEQ/50ML IV SOLN
10.0000 meq | INTRAVENOUS | Status: AC
  Administered 2016-06-30 (×2): 10 meq via INTRAVENOUS
  Filled 2016-06-30 (×2): qty 50

## 2016-06-30 MED ORDER — POTASSIUM CHLORIDE 10 MEQ/50ML IV SOLN
10.0000 meq | Freq: Once | INTRAVENOUS | Status: AC
  Administered 2016-06-30: 10 meq via INTRAVENOUS
  Filled 2016-06-30: qty 50

## 2016-06-30 MED ORDER — INFLUENZA VAC SPLIT QUAD 0.5 ML IM SUSY
0.5000 mL | PREFILLED_SYRINGE | Freq: Once | INTRAMUSCULAR | Status: AC
  Administered 2016-07-04: 0.5 mL via INTRAMUSCULAR
  Filled 2016-06-30: qty 0.5

## 2016-06-30 MED ORDER — POTASSIUM CHLORIDE 10 MEQ/50ML IV SOLN
10.0000 meq | INTRAVENOUS | Status: AC
  Administered 2016-06-30 (×3): 10 meq via INTRAVENOUS
  Filled 2016-06-30 (×6): qty 50

## 2016-06-30 MED ORDER — POTASSIUM CHLORIDE 10 MEQ/50ML IV SOLN: 10.0000 meq | INTRAVENOUS | Status: DC

## 2016-06-30 NOTE — Clinical Social Work Note (Signed)
Clinical Social Work Assessment  Patient Details  Name: Ann Potter MRN: [id number] Date of Birth: 1941/02/15  Date of referral:  06/29/16               Reason for consult:  Discharge Planning                Permission sought to share information with:  Facility Sport and exercise psychologist, Family Supports Permission granted to share information::  Yes, Verbal Permission Granted  Name::     Scientist, research (physical sciences)::  SNFs  Relationship::  Daughter  Contact Information:     Housing/Transportation Living arrangements for the past 2 months:    Source of Information:  Patient Patient Interpreter Needed:  None Criminal Activity/Legal Involvement Pertinent to Current Situation/Hospitalization:  No - Comment as needed Significant Relationships:  Adult Children Lives with:  Adult Children Do you feel safe going back to the place where you live?  Yes Need for family participation in patient care:  Yes (Comment)  Care giving concerns:  The patient is agreeable for short term rehab at discharge. Patient would like to rebuild her strength to return home.    Social Worker assessment / plan:  CSW met with patient at beside to complete assessment. Patient was resting comfortably in bed. CSW explained PT recommendation for SNF placement. CSW explained SNF search and placement process to the patient and answered her questions. Patient reported preference for [facility] Patient reported her support as her daughter. Per patient request CSW called patient's daughter Ann Potter, CSW left a message for a return call. CSW will follow up with bed offers.   Employment status:  Retired Nurse, adult PT Recommendations:  [place] / Referral to community resources:  [place]  Patient/Family's Response to care:  The patient appears happy with the care she is receiving in hospital and is appreciative of CSW  assistance.  Patient/Family's Understanding of and Emotional Response to Diagnosis, Current Treatment, and Prognosis:  The patient has a good understanding of why she was admitted. She understands the care plan and what she will need post discharge.  Emotional Assessment Appearance:  Appears stated age Attitude/Demeanor/Rapport:   (Patient was appropriate.) Affect (typically observed):  Accepting, Calm, Appropriate Orientation:  Oriented to Self, Oriented to Place, Oriented to  Time, Oriented to Situation Alcohol / Substance use:  Not Applicable Psych involvement (Current and /or in the community):  No (Comment)  Discharge Needs  Concerns to be addressed:  Discharge Planning Concerns Readmission within the last 30 days:  No Current discharge risk:  Physical Impairment Barriers to Discharge:  Continued Medical Work up   [employer], LCSW 06/30/2016, 3:31 PM

## 2016-06-30 NOTE — Progress Notes (Signed)
CRITICAL VALUE ALERT  Critical value received:  K 2.4  Date of notification: 06/30/16   Time of notification:  0430  Critical value read back: Yes  Nurse who received alert: Nida BoatmanBrad RN  MD notified (1st page): Tyrone SageGerhardt MD  Time of first page: 0520  MD notified (2nd page):  Time of second page:  Responding MD:  Tyrone SageGerhardt MD  Time MD responded:  [id number]   Marlou PorchBradley Shemiah Rosch

## 2016-06-30 NOTE — Progress Notes (Signed)
Initial Nutrition Assessment  DOCUMENTATION CODES:   Morbid obesity  INTERVENTION:    Glucerna Shake po TID, each supplement provides 220 kcal and 10 grams of protein  NUTRITION DIAGNOSIS:   Increased nutrient needs related to  (post-op healing) as evidenced by estimated needs  GOAL:   Patient will meet greater than or equal to 90% of their needs  MONITOR:   PO intake, Supplement acceptance, Labs, Weight trends, Skin, I & O's  REASON FOR ASSESSMENT:   Consult Assessment of nutrition requirement/status  ASSESSMENT:   75 yo Female admitted 06/07/2016 due to 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  Patient reports she ate "a little bit" for breakfast this AM. Appetite PTA was good (pt lives in assisted living facility). No % PO intake records available per flowsheets. Would benefit from oral nutrition supplements >> will order. Nutrition focused physical exam completed.  No muscle or subcutaneous fat depletion noticed.  Albumin low but has half-life of 21 days and is strongly affected by stress response and inflammatory process.  Do not expect to see an improvement during acute hospitalization.  Diet Order:  Diet heart healthy/carb modified Room service appropriate? Yes; Fluid consistency: Thin  Skin:  Reviewed, no issues   CBG (last 3)   Recent Labs  06/29/16 1626 06/29/16 2125 06/30/16 0854  GLUCAP 119* 126* 118*   Last BM:  9/28  Height:   Ht Readings from Last 1 Encounters:  06/09/2016 5' (1.524 m)    Weight:   Wt Readings from Last 1 Encounters:  06/30/16 253 lb (114.8 kg)    Ideal Body Weight:  45.4 kg  BMI:  Body mass index is 49.41 kg/m.  Estimated Nutritional Needs:   Kcal:  1600-1800  Protein:  80-90 gm  Fluid:  1.6-1.8 L  EDUCATION NEEDS:   No education needs identified at this time  Maureen ChattersKatie Calin Fantroy, RD, LDN Pager #: [phone] After-Hours Pager #: [phone]

## 2016-06-30 NOTE — Progress Notes (Signed)
TCTS BRIEF SICU PROGRESS NOTE  5 Days Post-Op  S/P Procedure(s) (LRB): MITRAL VALVE (MV) REPLACEMENT (N/A) TRICUSPID VALVE REPAIR (N/A) MAZE (N/A) TRANSESOPHAGEAL ECHOCARDIOGRAM (TEE) (N/A)   Stable day Mobility remains her biggest problem and limitation HR and BP stable Breathing comfortably Diuresing well Repeat potassium level 3.0  Plan: Continue current plan  Purcell Nailslarence H Cary Wilford, MD 06/30/2016 6:12 PM

## 2016-06-30 NOTE — Progress Notes (Addendum)
[address]       [name] [address]             [phone]        CARDIOTHORACIC SURGERY PROGRESS NOTE   R5 Days Post-Op Procedure(s) (LRB): MITRAL VALVE (MV) REPLACEMENT (N/A) TRICUSPID VALVE REPAIR (N/A) MAZE (N/A) TRANSESOPHAGEAL ECHOCARDIOGRAM (TEE) (N/A)  Subjective: No complaints except some lower back pain  Objective: Vital signs: BP Readings from Last 1 Encounters:  06/30/16 (!) 104/48   Pulse Readings from Last 1 Encounters:  06/30/16 64   Resp Readings from Last 1 Encounters:  06/30/16 16   Temp Readings from Last 1 Encounters:  06/30/16 97.7 F (36.5 C) (Oral)    Hemodynamics:    Physical Exam:  Rhythm:   Junctional w/ HR 60-70  Breath sounds: Diminished at bases, otherwise clear  Heart sounds:  RRR  Incisions:  Clean and dry  Abdomen:  Soft, non-distended, non-tender  Extremities:  Warm, well-perfused   Intake/Output from previous day: 10/02 0701 - 10/03 0700 In: 696.4 [P.O.:60; I.V.:386.4; IV Piggyback:250] Out: 3480 [Urine:3480] Intake/Output this shift: No intake/output data recorded.  Lab Results:  CBC: Recent Labs  06/29/16 0345 06/30/16 0342  WBC 8.4 6.3  HGB 8.2* 8.3*  HCT 25.7* 25.7*  PLT 154 176    BMET:  Recent Labs  06/29/16 0345 06/30/16 0342  NA 132* 133*  K 3.3* 2.4*  CL 98* 93*  CO2 24 30  GLUCOSE 123* 151*  BUN 41* 45*  CREATININE 1.44* 1.41*  CALCIUM 8.7* 8.9     PT/INR:   Recent Labs  06/30/16 0342  LABPROT 15.4*  INR 1.21    CBG (last 3)   Recent Labs  06/29/16 1135 06/29/16 1626 06/29/16 2125  GLUCAP 131* 119* 126*    ABG    Component Value Date/Time   PHART 7.337 (L) 06/26/2016 0859   PCO2ART 43.2 06/26/2016 0859   PO2ART 90.0 06/26/2016 0859   HCO3 23.2 06/26/2016 0859   TCO2 25 06/26/2016 1656   ACIDBASEDEF 3.0 (H) 06/26/2016 0859   O2SAT 62.8 06/30/2016 0340    CXR: PORTABLE CHEST 1 VIEW  COMPARISON:  06/29/2016  FINDINGS: Previous median  sternotomy and valve replacement. Cardiomegaly persists. Near total collapse of left lower lobe persists. Venous hypertension with mild interstitial edema persists. Small amount of pleural fluid bilaterally persists. Right arm PICC has its tip in the SVC 3 cm above the right atrium.  IMPRESSION: Similar to yesterday's exam. Cardiomegaly, pulmonary venous hypertension and mild interstitial edema. Small effusions. Total or subtotal left lower lobe collapse.   Electronically Signed   By: Paulina Fusi M.D.   On: 06/30/2016 07:35   Assessment/Plan: S/P Procedure(s) (LRB): MITRAL VALVE (MV) REPLACEMENT (N/A) TRICUSPID VALVE REPAIR (N/A) MAZE (N/A) TRANSESOPHAGEAL ECHOCARDIOGRAM (TEE) (N/A)  Overall stable now POD5 Maintaining junctional rhythm w/ stable HR and BP, still on dopamine @ 3 mcg/kg/min  Acute on chronic diastolic CHF with expected post-op volume excess, weight still 14 lbs above preop baseline - now diuresing well I/O negative 2.8 liters yesterday Hypokalemia - diuretic induced Longstanding rheumatic heart disease  Pulmonary hypertension Permanent atrial fibrillation now s/p maze procedure - no recurrence so far Chronic kidney disease stage III, creatinine stable and UOP improved Expected post op acute blood loss anemia, Hgb stable Expected post op atelectasis, mild L>R Morbid obesity Physical deconditioning - severe Protein-depleted malnutrition Chronic venous insufficiency   Continue lasix drip  Wean dopamine as tolerated  Supplement  potassium  Add aldactone  Keep in ICU due to severely limited mobility - still needs 3 person assist  Ann Nailslarence H Avelynn Sellin, MD 06/30/2016 8:01 AM

## 2016-07-01 LAB — BASIC METABOLIC PANEL
Anion gap: 9 (ref 5–15)
Anion gap: 10 (ref 5–15)
BUN: 47 mg/dL — ABNORMAL HIGH (ref 6–20)
BUN: 45 mg/dL — ABNORMAL HIGH (ref 6–20)
CALCIUM: 8.7 mg/dL — AB (ref 8.9–10.3)
CALCIUM: 8.9 mg/dL (ref 8.9–10.3)
CHLORIDE: 93 mmol/L — AB (ref 101–111)
CHLORIDE: 91 mmol/L — AB (ref 101–111)
CO2: 32 mmol/L (ref 22–32)
CO2: 33 mmol/L — ABNORMAL HIGH (ref 22–32)
CREATININE: 1.29 mg/dL — AB (ref 0.44–1.00)
CREATININE: 1.24 mg/dL — AB (ref 0.44–1.00)
GFR calc Af Amer: 48 mL/min — ABNORMAL LOW (ref >60)
GFR, EST AFRICAN AMERICAN: 45 mL/min — AB (ref >60)
GFR, EST NON AFRICAN AMERICAN: 39 mL/min — AB (ref >60)
GFR, EST NON AFRICAN AMERICAN: 41 mL/min — AB (ref >60)
Glucose, Bld: 129 mg/dL — ABNORMAL HIGH (ref 65–99)
Glucose, Bld: 110 mg/dL — ABNORMAL HIGH (ref 65–99)
POTASSIUM: 3.1 mmol/L — AB (ref 3.5–5.1)
Potassium: 2.5 mmol/L — CL (ref 3.5–5.1)
SODIUM: 134 mmol/L — AB (ref 135–145)
Sodium: 134 mmol/L — ABNORMAL LOW (ref 135–145)

## 2016-07-01 LAB — GLUCOSE, CAPILLARY
GLUCOSE-CAPILLARY: 144 mg/dL — AB (ref 65–99)
GLUCOSE-CAPILLARY: 103 mg/dL — AB (ref 65–99)
Glucose-Capillary: 121 mg/dL — ABNORMAL HIGH (ref 65–99)
Glucose-Capillary: 139 mg/dL — ABNORMAL HIGH (ref 65–99)

## 2016-07-01 LAB — CARBOXYHEMOGLOBIN
Carboxyhemoglobin: 1.6 % — ABNORMAL HIGH (ref 0.5–1.5)
Methemoglobin: 1 % (ref 0.0–1.5)
O2 Saturation: 72.3 %
TOTAL HEMOGLOBIN: 8.3 g/dL — AB (ref 12.0–16.0)

## 2016-07-01 LAB — PROTIME-INR
INR: 1.17
PROTHROMBIN TIME: 15 s (ref 11.4–15.2)

## 2016-07-01 LAB — PREALBUMIN: Prealbumin: 5.1 mg/dL — ABNORMAL LOW (ref 18–38)

## 2016-07-01 MED ORDER — ACETAMINOPHEN 325 MG PO TABS
650.0000 mg | ORAL_TABLET | Freq: Three times a day (TID) | ORAL | Status: DC | PRN
  Administered 2016-07-02 – 2016-07-05 (×5): 650 mg via ORAL
  Filled 2016-07-01 (×5): qty 2

## 2016-07-01 MED ORDER — POTASSIUM CHLORIDE 10 MEQ/50ML IV SOLN
10.0000 meq | INTRAVENOUS | Status: AC
  Administered 2016-07-01 (×3): 10 meq via INTRAVENOUS
  Filled 2016-07-01 (×3): qty 50

## 2016-07-01 MED ORDER — WARFARIN SODIUM 5 MG PO TABS
5.0000 mg | ORAL_TABLET | Freq: Every day | ORAL | Status: DC
  Administered 2016-07-01 – 2016-07-04 (×4): 5 mg via ORAL
  Filled 2016-07-01 (×4): qty 1

## 2016-07-01 MED ORDER — POTASSIUM CHLORIDE CRYS ER 20 MEQ PO TBCR
40.0000 meq | EXTENDED_RELEASE_TABLET | Freq: Three times a day (TID) | ORAL | Status: DC
  Administered 2016-07-01 – 2016-07-06 (×16): 40 meq via ORAL
  Filled 2016-07-01 (×16): qty 2

## 2016-07-01 MED ORDER — POTASSIUM CHLORIDE 10 MEQ/50ML IV SOLN
10.0000 meq | INTRAVENOUS | Status: AC
  Administered 2016-07-01 (×5): 10 meq via INTRAVENOUS
  Filled 2016-07-01 (×5): qty 50

## 2016-07-01 MED ORDER — ORAL CARE MOUTH RINSE
15.0000 mL | Freq: Two times a day (BID) | OROMUCOSAL | Status: DC
  Administered 2016-07-01 – 2016-07-04 (×4): 15 mL via OROMUCOSAL

## 2016-07-01 NOTE — Care Management Important Message (Signed)
Important Message  Patient Details  Name: Ann PoeGloria Potter MRN: [id number] Date of Birth: 04-24-1941   Medicare Important Message Given:  Yes    Zarek Relph [name] 07/01/2016, 10:30 AM

## 2016-07-01 NOTE — Progress Notes (Signed)
Patient ID: Ann PoeGloria Feldkamp, female   DOB: 04-22-1941, 75 y.o.   MRN: [id number]  SICU Evening Rounds:   Hemodynamically stable     Urine output good on lasix drip   CBC    Component Value Date/Time   WBC 6.3 06/30/2016 0342   RBC 2.63 (L) 06/30/2016 0342   HGB 8.3 (L) 06/30/2016 0342   HCT 25.7 (L) 06/30/2016 0342   PLT 176 06/30/2016 0342   MCV 97.7 06/30/2016 0342   MCH 31.6 06/30/2016 0342   MCHC 32.3 06/30/2016 0342   RDW 15.4 06/30/2016 0342   LYMPHSABS 1.1 08/01/2012 1440   MONOABS 0.3 08/01/2012 1440   EOSABS 0.1 08/01/2012 1440   BASOSABS 0.0 08/01/2012 1440     BMET    Component Value Date/Time   NA 134 (L) 07/01/2016 1818   K 3.1 (L) 07/01/2016 1818   CL 91 (L) 07/01/2016 1818   CO2 33 (H) 07/01/2016 1818   GLUCOSE 110 (H) 07/01/2016 1818   BUN 45 (H) 07/01/2016 1818   CREATININE 1.24 (H) 07/01/2016 1818   CALCIUM 8.9 07/01/2016 1818   GFRNONAA 41 (L) 07/01/2016 1818   GFRAA 48 (L) 07/01/2016 1818     A/P:  Stable postop course. Hypokalemia being repleted.  Continue current plans

## 2016-07-01 NOTE — Progress Notes (Signed)
[address]       [name] [id number]             [phone]        CARDIOTHORACIC SURGERY PROGRESS NOTE   R6 Days Post-Op Procedure(s) (LRB): MITRAL VALVE (MV) REPLACEMENT (N/A) TRICUSPID VALVE REPAIR (N/A) MAZE (N/A) TRANSESOPHAGEAL ECHOCARDIOGRAM (TEE) (N/A)  Subjective: No complaints except chronic back pain.  Denies SOB.  + loose BM overnight.  Objective: Vital signs: BP Readings from Last 1 Encounters:  07/01/16 123/72   Pulse Readings from Last 1 Encounters:  07/01/16 62   Resp Readings from Last 1 Encounters:  07/01/16 (!) 21   Temp Readings from Last 1 Encounters:  07/01/16 97.9 F (36.6 C) (Oral)    Hemodynamics:  Mixed venous co-ox 72%  Physical Exam:  Rhythm:   Junctional w/ HR 60-70  Breath sounds: Diminished at bases  Heart sounds:  RRR   Incisions:  Clean and dry  Abdomen:  Soft, non-distended, non-tender  Extremities:  Warm, well-perfused, swollen   Intake/Output from previous day: 10/03 0701 - 10/04 0700 In: 1284.5 [P.O.:420; I.V.:264.5; IV Piggyback:600] Out: 3475 [Urine:3475] Intake/Output this shift: Total I/O In: 70 [I.V.:20; IV Piggyback:50] Out: 225 [Urine:225]  Lab Results:  CBC: Recent Labs  06/29/16 0345 06/30/16 0342  WBC 8.4 6.3  HGB 8.2* 8.3*  HCT 25.7* 25.7*  PLT 154 176    BMET:  Recent Labs  06/30/16 1600 07/01/16 0322  NA 134* 134*  K 3.0* 2.5*  CL 95* 93*  CO2 30 32  GLUCOSE 118* 129*  BUN 48* 47*  CREATININE 1.35* 1.29*  CALCIUM 8.6* 8.7*     PT/INR:   Recent Labs  07/01/16 0322  LABPROT 15.0  INR 1.17    CBG (last 3)   Recent Labs  06/30/16 1642 06/30/16 2142 07/01/16 0742  GLUCAP 111* 115* 121*    ABG    Component Value Date/Time   PHART 7.337 (L) 06/26/2016 0859   PCO2ART 43.2 06/26/2016 0859   PO2ART 90.0 06/26/2016 0859   HCO3 23.2 06/26/2016 0859   TCO2 25 06/26/2016 1656   ACIDBASEDEF 3.0 (H) 06/26/2016 0859   O2SAT 72.3 07/01/2016 0325     CXR: n/a  Assessment/Plan: S/P Procedure(s) (LRB): MITRAL VALVE (MV) REPLACEMENT (N/A) TRICUSPID VALVE REPAIR (N/A) MAZE (N/A) TRANSESOPHAGEAL ECHOCARDIOGRAM (TEE) (N/A)  Overall stable now POD6 Maintaining junctional rhythm w/ stable HR and BP Permanent atrial fibrillation now s/p maze procedure - HR stable and well controlled Acute on chronic diastolic CHF with expected post-op volume excess, weight still 12 lbs above preop baseline - now diuresing well I/O negative 2.2 liters yesterday Hypokalemia - diuretic induced Longstanding rheumatic heart disease  Pulmonary hypertension Chronic kidney disease stage III, creatinine stable/improved - diuresing well Expected post op acute blood loss anemia, Hgb stable Expected post op atelectasis, mild L>R Morbid obesity Physical deconditioning - severe Protein-depleted malnutrition Chronic venous insufficiency   Continue lasix drip for now and leave foley in place  Supplement potassium - will add oral  Continue aldactone  Consider adding low dose ACE-I in 2-3 days if BP will allow  Keep in ICU due to severely limited mobility - still needs 3 person assist   Hope to d/c foley and transfer stepdown once weight closer to preop baseline and mobility improved  Ultimately plan for d/c to SNF for rehab - patient hopes to [facility] in [place]  Clarence H Owen, MD 07/01/2016 8:33 AM

## 2016-07-01 NOTE — Progress Notes (Signed)
CRITICAL VALUE ALERT  Critical value received:  K 2.5  Date of notification:  07/01/16   Time of notification:  0405  Critical value read back: Yes  Nurse who received alert:  Nida BoatmanBrad RN  MD notified (1st page):  Cornelius Moraswen MD  Time of first page:  0500  MD notified (2nd page):  Time of second page:  Responding MD:  Cornelius Moraswen MD  Time MD responded:  0500   Marlou PorchBradley Ersie Savino

## 2016-07-02 ENCOUNTER — Inpatient Hospital Stay (HOSPITAL_COMMUNITY): Payer: Medicare Other

## 2016-07-02 LAB — CBC
HEMATOCRIT: 27.2 % — AB (ref 36.0–46.0)
HEMOGLOBIN: 8.5 g/dL — AB (ref 12.0–15.0)
MCH: 31 pg (ref 26.0–34.0)
MCHC: 31.3 g/dL (ref 30.0–36.0)
MCV: 99.3 fL (ref 78.0–100.0)
Platelets: 238 10*3/uL (ref 150–400)
RBC: 2.74 MIL/uL — ABNORMAL LOW (ref 3.87–5.11)
RDW: 15.9 % — AB (ref 11.5–15.5)
WBC: 8 10*3/uL (ref 4.0–10.5)

## 2016-07-02 LAB — BASIC METABOLIC PANEL
Anion gap: 11 (ref 5–15)
BUN: 46 mg/dL — AB (ref 6–20)
CHLORIDE: 92 mmol/L — AB (ref 101–111)
CO2: 32 mmol/L (ref 22–32)
CREATININE: 1.22 mg/dL — AB (ref 0.44–1.00)
Calcium: 9 mg/dL (ref 8.9–10.3)
GFR calc Af Amer: 49 mL/min — ABNORMAL LOW (ref >60)
GFR calc non Af Amer: 42 mL/min — ABNORMAL LOW (ref >60)
Glucose, Bld: 119 mg/dL — ABNORMAL HIGH (ref 65–99)
Potassium: 3.7 mmol/L (ref 3.5–5.1)
Sodium: 135 mmol/L (ref 135–145)

## 2016-07-02 LAB — GLUCOSE, CAPILLARY
Glucose-Capillary: 117 mg/dL — ABNORMAL HIGH (ref 65–99)
Glucose-Capillary: 141 mg/dL — ABNORMAL HIGH (ref 65–99)
Glucose-Capillary: 128 mg/dL — ABNORMAL HIGH (ref 65–99)

## 2016-07-02 LAB — POCT I-STAT, CHEM 8
BUN: 46 mg/dL — ABNORMAL HIGH (ref 6–20)
CALCIUM ION: 1.16 mmol/L (ref 1.15–1.40)
CREATININE: 1.6 mg/dL — AB (ref 0.44–1.00)
Chloride: 88 mmol/L — ABNORMAL LOW (ref 101–111)
Glucose, Bld: 95 mg/dL (ref 65–99)
HEMATOCRIT: 24 % — AB (ref 36.0–46.0)
HEMOGLOBIN: 8.2 g/dL — AB (ref 12.0–15.0)
Potassium: 3.9 mmol/L (ref 3.5–5.1)
Sodium: 132 mmol/L — ABNORMAL LOW (ref 135–145)
TCO2: 34 mmol/L (ref 0–100)

## 2016-07-02 LAB — PROTIME-INR
INR: 1.26
Prothrombin Time: 15.9 seconds — ABNORMAL HIGH (ref 11.4–15.2)

## 2016-07-02 MED ORDER — SODIUM CHLORIDE 0.9% FLUSH
10.0000 mL | Freq: Two times a day (BID) | INTRAVENOUS | Status: DC
  Administered 2016-07-02 – 2016-07-06 (×8): 10 mL
  Administered 2016-07-06 – 2016-07-07 (×2): 40 mL
  Administered 2016-07-07: 20 mL
  Administered 2016-07-08: 40 mL
  Administered 2016-07-08: 10 mL
  Administered 2016-07-09: 40 mL
  Administered 2016-07-09: 20 mL
  Administered 2016-07-10: 30 mL
  Administered 2016-07-10 – 2016-07-12 (×5): 10 mL
  Administered 2016-07-14: 20 mL
  Administered 2016-07-14: 10 mL
  Administered 2016-07-15: 20 mL
  Administered 2016-07-16: 10 mL
  Administered 2016-07-16: 20 mL
  Administered 2016-07-20: 10 mL

## 2016-07-02 MED ORDER — POTASSIUM CHLORIDE 10 MEQ/50ML IV SOLN
10.0000 meq | INTRAVENOUS | Status: AC
  Administered 2016-07-02 (×3): 10 meq via INTRAVENOUS
  Filled 2016-07-02 (×3): qty 50

## 2016-07-02 MED ORDER — SODIUM CHLORIDE 0.9% FLUSH
10.0000 mL | INTRAVENOUS | Status: DC | PRN
  Administered 2016-07-04: 20 mL
  Administered 2016-07-10: 10 mL
  Filled 2016-07-02 (×2): qty 40

## 2016-07-02 MED ORDER — METOLAZONE 5 MG PO TABS
5.0000 mg | ORAL_TABLET | Freq: Every day | ORAL | Status: DC
  Administered 2016-07-02 – 2016-07-07 (×5): 5 mg via ORAL
  Filled 2016-07-02 (×5): qty 1

## 2016-07-02 NOTE — Significant Event (Addendum)
Patient ambulated in the hallways twice today, this morning with PT and then this evening with nursing staff. Patient ambulated a total of 66 feet this evening using standing walker and two staff assistance. Patient had one sitting down rest during ambulation. Patient endorsed SOB; VS stable. Patient returned to room and sat in chair.

## 2016-07-02 NOTE — Progress Notes (Signed)
      [address]       [place] [id number]             [phone]      7 Days Post-Op Procedure(s) (LRB): MITRAL VALVE (MV) REPLACEMENT (N/A) TRICUSPID VALVE REPAIR (N/A) MAZE (N/A) TRANSESOPHAGEAL ECHOCARDIOGRAM (TEE) (N/A) Subjective: No issues overnight. She does have a productive cough this morning.  Objective: Vital signs in last 24 hours: Temp:  [97.4 F (36.3 C)-98.9 F (37.2 C)] 98.3 F (36.8 C) (10/05 0724) Pulse Rate:  [54-86] 54 (10/05 0700) Cardiac Rhythm: Atrial fibrillation;Junctional rhythm (10/05 0400) Resp:  [16-24] 19 (10/05 0700) BP: (95-127)/(47-95) 109/60 (10/05 0700) SpO2:  [94 %-100 %] 96 % (10/05 0700) Weight:  [251 lb 1.7 oz (113.9 kg)] 251 lb 1.7 oz (113.9 kg) (10/05 0300)     Intake/Output from previous day: 10/04 0701 - 10/05 0700 In: 1800 [P.O.:780; I.V.:470; IV Piggyback:550] Out: 2230 [Urine:2230] Intake/Output this shift: No intake/output data recorded.  General appearance: alert, cooperative and no distress Heart: S1, S2 normal, sinus bradycardia rate 55 Lungs: CTA bilaterally, diminished breath sounds in LLL Abdomen: soft, non-tender; bowel sounds normal; no masses,  no organomegaly Extremities: edema 3+ pitting pedal edema Wound: c/d/i without drainage  Lab Results:  Recent Labs  06/30/16 0342 07/02/16 0335  WBC 6.3 8.0  HGB 8.3* 8.5*  HCT 25.7* 27.2*  PLT 176 238   BMET:  Recent Labs  07/01/16 1818 07/02/16 0335  NA 134* 135  K 3.1* 3.7  CL 91* 92*  CO2 33* 32  GLUCOSE 110* 119*  BUN 45* 46*  CREATININE 1.24* 1.22*  CALCIUM 8.9 9.0    PT/INR:  Recent Labs  07/02/16 0335  LABPROT 15.9*  INR 1.26   ABG    Component Value Date/Time   PHART 7.337 (L) 06/26/2016 0859   HCO3 23.2 06/26/2016 0859   TCO2 25 06/26/2016 1656   ACIDBASEDEF 3.0 (H) 06/26/2016 0859   O2SAT 72.3 07/01/2016 0325   CBG (last 3)   Recent Labs  07/01/16 1639 07/01/16 1942 07/02/16 0720  GLUCAP 103* 139*  117*    Assessment/Plan: S/P Procedure(s) (LRB): MITRAL VALVE (MV) REPLACEMENT (N/A) TRICUSPID VALVE REPAIR (N/A) MAZE (N/A) TRANSESOPHAGEAL ECHOCARDIOGRAM (TEE) (N/A)  1. CV-sinus bradycardia rate 55, stable BP. No Beta blocker. Permanent atrial fibrillation now s/p maze.  2. Acute on chronic diastolic heart failure with expected fluid overload. -2L yesterday. Weight stable. Remains 12 lbs fluid overloaded. Continue Lasix drip and spirolactone. Encourage IS 3. Pulm-Pulmonary hypertension. Tolerating 2L [place]. Continue nebs and Singulair. CXR today shows slightly improved atelectasis, L > R. Small bilateral pleural effusions.  4. Renal- CKD stage III, creatinine stable 1.22. Potassium improved today. Will replace as needed 5. Expected acute blood loss anemia- H & H stable 6. Morbid obesity, working with PT/OT for increased mobilization 7. Endocrine-blood glucose levels well controlled. SSI 8. Anticoagulation- Coumadin 5mg  daily. INR 1.26  Plan: Continue ICU care. HR and BP stable. Continue lasix drip and PO spirolactone for fluid overload. Adding Zaroxolyn.  Keep in ICU due to severe limited mobility requiring a three person assist. Patient will need a SNF for rehab-the patient [facility].    LOS: 7 days    Ann Potter 07/02/2016  No wt loss overnite- will increase lasix drip and cont oral metolazone and aldactone CXR wet Bradycardic but EPWs are out Agree with above assessment and  Plan  P Zenaida NieceVan trigt

## 2016-07-02 NOTE — Clinical Social Work Placement (Addendum)
   CLINICAL SOCIAL WORK PLACEMENT  NOTE  Date:  07/02/2016  Patient Details  Name: Ann Potter MRN: [id number] Date of Birth: November 08, 1940  Clinical Social Work is seeking post-discharge placement for this patient at the Skilled  Nursing Facility level of care (*CSW will initial, date and re-position this form in  chart as items are completed):  Yes   Patient/family provided with [facility] Clinical Social Work Department's list of facilities offering this level of care within the geographic area requested by the patient (or if unable, by the patient's family).  Yes   Patient/family informed of their freedom to choose among providers that offer the needed level of care, that participate in Medicare, Medicaid or managed care program needed by the patient, have an available bed and are willing to accept the patient.  Yes   Patient/family informed of [facility]'s ownership interest in [facility] and [facility], as well as of the fact that they are under no obligation to receive care at these facilities.  PASRR submitted to EDS on 06/30/16     PASRR number received on 06/30/16 ([id number])     Existing PASRR number confirmed on       FL2 transmitted to all facilities in geographic area requested by pt/family on       FL2 transmitted to all facilities within larger geographic area on       Patient informed that his/her managed care company has contracts with or will negotiate with certain facilities, including the following:            Patient/family informed of bed offers received.  Patient chooses bed at       Physician recommends and patient chooses bed at      Patient to be transferred to   on  .  Patient to be transferred to facility by       Patient family notified on   of transfer.  Name of family member notified:        PHYSICIAN   Additional Comment:    _______________________________________________ Reggy EyeLaShonda A Arren Laminack, LCSW 07/02/2016, 6:00 PM

## 2016-07-02 NOTE — Progress Notes (Signed)
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   PMH:  morbid obesity, HOH, poor eyesight, mitral stenosis, tricuspid regurg, rheumatic heart disease, anxiety, Afib, CAD, MI, CHF, venous insufficiency, chronic LE edema, HTN, CKD, asthma, chronic back pain    PT Comments    Pt pleasant but rather tired today. Pt able to recall 2/5 precautions initially and 4/5 end of session with education for all provided. Pt with increased ability with transfers and gait but continues to require 2 person assist with gait limited to 20', pt declined additional ambulation trial. Will continue to follow and encouraged HEP throughout the day.   BP 107/56 before 133/75 after HR 55-68 sats 96% on 2L throughout  Follow Up Recommendations  SNF;Supervision/Assistance - 24 hour     Equipment Recommendations       Recommendations for Other Services       Precautions / Restrictions Precautions Precautions: Sternal;Fall Precaution Comments:  Reviewed sternal precautions    Mobility  Bed Mobility               General bed mobility comments: in chair on arrival and requested return to chair  Transfers Overall transfer level: Needs assistance   Transfers: Sit to/from Stand Sit to Stand: Mod assist;+2 physical assistance         General transfer comment: cues for hand placement with assist for anterior translation and elevation x 3 trials with max cues to step fully back to surface for position back in chair. Unable to perform reciprocal scooting with max +2 assist.   Ambulation/Gait Ambulation/Gait assistance: Min assist;+2 safety/equipment Ambulation Distance (Feet): 20 Feet Assistive device: Rolling walker (2 wheeled) Gait  Pattern/deviations: Step-through pattern;Decreased stride length;Trunk flexed   Gait velocity interpretation: Below normal speed for age/gender General Gait Details: cues for posture and safety with pt maintaining flexed posture, chair to follow as pt with poor activity tolerance    Stairs            Wheelchair Mobility    Modified Rankin (Stroke Patients Only)       Balance                                    Cognition Arousal/Alertness: Awake/alert Behavior During Therapy: WFL for tasks assessed/performed Overall Cognitive Status: Within Functional Limits for tasks assessed       Memory: Decreased recall of precautions              Exercises General Exercises - Lower Extremity Heel Slides: AAROM;Both;10 reps;Seated Hip ABduction/ADduction: AROM;Both;Seated;10 reps    General Comments        Pertinent Vitals/Pain Pain Assessment: No/denies pain    Home Living                      Prior Function            PT Goals (current goals can now be found in the care plan section) Progress towards PT goals: Progressing toward goals    Frequency           PT Plan Current plan remains appropriate    Co-evaluation  End of Session Equipment Utilized During Treatment: Gait belt;Oxygen Activity Tolerance: Patient tolerated treatment well Patient left: in chair;with call bell/phone within reach     Time: 1007-1031 PT Time Calculation (min) (ACUTE ONLY): 24 min  Charges:  $Gait Training: 8-22 mins $Therapeutic Activity: 8-22 mins                    G Codes:      Delorse Lek July 08, 2016, 1:40 PM [name], PT [phone]

## 2016-07-02 NOTE — Progress Notes (Signed)
      [address]       [name] [id number]             [phone]      Ambulated 2 x today, once with PT  BP 117/62   Pulse (!) 53   Temp 97.7 F (36.5 C) (Oral)   Resp 18   Ht 5\' 1"  (1.549 m)   Wt 251 lb 1.7 oz (113.9 kg)   SpO2 95%   BMI 47.45 kg/m    Intake/Output Summary (Last 24 hours) at 07/02/16 1925 Last data filed at 07/02/16 1900  Gross per 24 hour  Intake             1159 ml  Output             2350 ml  Net            -1191 ml   Creatinine up to 1.6 with increased diuresis- recheck in AM  Ann Anctil C. Dorris FetchHendrickson, MD [facility] [phone]

## 2016-07-03 ENCOUNTER — Inpatient Hospital Stay (HOSPITAL_COMMUNITY): Payer: Medicare Other

## 2016-07-03 LAB — GLUCOSE, CAPILLARY
GLUCOSE-CAPILLARY: 113 mg/dL — AB (ref 65–99)
Glucose-Capillary: 141 mg/dL — ABNORMAL HIGH (ref 65–99)
Glucose-Capillary: 92 mg/dL (ref 65–99)

## 2016-07-03 LAB — CBC
HCT: 25.9 % — ABNORMAL LOW (ref 36.0–46.0)
Hemoglobin: 8.1 g/dL — ABNORMAL LOW (ref 12.0–15.0)
MCH: 31.2 pg (ref 26.0–34.0)
MCHC: 31.3 g/dL (ref 30.0–36.0)
MCV: 99.6 fL (ref 78.0–100.0)
Platelets: 242 10*3/uL (ref 150–400)
RBC: 2.6 MIL/uL — ABNORMAL LOW (ref 3.87–5.11)
RDW: 16.1 % — ABNORMAL HIGH (ref 11.5–15.5)
WBC: 7.9 10*3/uL (ref 4.0–10.5)

## 2016-07-03 LAB — COMPREHENSIVE METABOLIC PANEL
ALT: 16 U/L (ref 14–54)
AST: 28 U/L (ref 15–41)
Albumin: 2.3 g/dL — ABNORMAL LOW (ref 3.5–5.0)
Alkaline Phosphatase: 92 U/L (ref 38–126)
Anion gap: 14 (ref 5–15)
BUN: 48 mg/dL — ABNORMAL HIGH (ref 6–20)
CO2: 33 mmol/L — ABNORMAL HIGH (ref 22–32)
Calcium: 8.8 mg/dL — ABNORMAL LOW (ref 8.9–10.3)
Chloride: 88 mmol/L — ABNORMAL LOW (ref 101–111)
Creatinine, Ser: 1.28 mg/dL — ABNORMAL HIGH (ref 0.44–1.00)
GFR calc Af Amer: 46 mL/min — ABNORMAL LOW (ref >60)
GFR calc non Af Amer: 40 mL/min — ABNORMAL LOW (ref >60)
Glucose, Bld: 104 mg/dL — ABNORMAL HIGH (ref 65–99)
Potassium: 4.2 mmol/L (ref 3.5–5.1)
Sodium: 135 mmol/L (ref 135–145)
Total Bilirubin: 1.2 mg/dL (ref 0.3–1.2)
Total Protein: 5.5 g/dL — ABNORMAL LOW (ref 6.5–8.1)

## 2016-07-03 LAB — PROTIME-INR
INR: 1.37
Prothrombin Time: 17 seconds — ABNORMAL HIGH (ref 11.4–15.2)

## 2016-07-03 MED ORDER — MIDAZOLAM HCL 2 MG/2ML IJ SOLN
INTRAMUSCULAR | Status: AC
  Filled 2016-07-03: qty 2

## 2016-07-03 MED ORDER — IPRATROPIUM-ALBUTEROL 0.5-2.5 (3) MG/3ML IN SOLN: 3.0000 mL | RESPIRATORY_TRACT | Status: DC | PRN

## 2016-07-03 MED ORDER — ALTEPLASE 2 MG IJ SOLR
2.0000 mg | Freq: Once | INTRAMUSCULAR | Status: AC
  Administered 2016-07-03: 2 mg

## 2016-07-03 MED ORDER — ALTEPLASE 2 MG IJ SOLR
2.0000 mg | Freq: Once | INTRAMUSCULAR | Status: AC
  Administered 2016-07-03: 2 mg
  Filled 2016-07-03: qty 2

## 2016-07-03 MED ORDER — ACETYLCYSTEINE 10 % IN SOLN: 3.0000 mL | Freq: Four times a day (QID) | RESPIRATORY_TRACT | Status: DC

## 2016-07-03 MED ORDER — ACETYLCYSTEINE 20 % IN SOLN
3.0000 mL | Freq: Four times a day (QID) | RESPIRATORY_TRACT | Status: DC
  Administered 2016-07-03 – 2016-07-04 (×2): 4 mL via RESPIRATORY_TRACT
  Administered 2016-07-04 – 2016-07-05 (×5): 3 mL via RESPIRATORY_TRACT
  Administered 2016-07-05 (×2): 4 mL via RESPIRATORY_TRACT
  Administered 2016-07-06 (×2): 3 mL via RESPIRATORY_TRACT
  Administered 2016-07-06: 4 mL via RESPIRATORY_TRACT
  Administered 2016-07-06 – 2016-07-07 (×3): 3 mL via RESPIRATORY_TRACT
  Administered 2016-07-07: 4 mL via RESPIRATORY_TRACT
  Administered 2016-07-07 – 2016-07-08 (×3): 3 mL via RESPIRATORY_TRACT
  Filled 2016-07-03 (×19): qty 4

## 2016-07-03 MED ORDER — DEXTROSE 5 % IV SOLN
1.0000 g | Freq: Three times a day (TID) | INTRAVENOUS | Status: DC
  Administered 2016-07-03 – 2016-07-14 (×32): 1 g via INTRAVENOUS
  Filled 2016-07-03 (×33): qty 1

## 2016-07-03 MED ORDER — FENTANYL CITRATE (PF) 100 MCG/2ML IJ SOLN
INTRAMUSCULAR | Status: AC
  Filled 2016-07-03: qty 2

## 2016-07-03 MED ORDER — GUAIFENESIN ER 600 MG PO TB12
1200.0000 mg | ORAL_TABLET | Freq: Two times a day (BID) | ORAL | Status: DC
  Administered 2016-07-03 – 2016-07-06 (×7): 1200 mg via ORAL
  Filled 2016-07-03 (×8): qty 2

## 2016-07-03 MED ORDER — ACETYLCYSTEINE 20 % IN SOLN: 3.0000 mL | Freq: Four times a day (QID) | RESPIRATORY_TRACT | Status: DC

## 2016-07-03 MED ORDER — ENOXAPARIN SODIUM 30 MG/0.3ML ~~LOC~~ SOLN
30.0000 mg | SUBCUTANEOUS | Status: DC
  Filled 2016-07-03: qty 0.3

## 2016-07-03 MED ORDER — ALTEPLASE 2 MG IJ SOLR: 2.0000 mg | Freq: Once | INTRAMUSCULAR | Status: AC

## 2016-07-03 MED ORDER — LEVALBUTEROL HCL 1.25 MG/0.5ML IN NEBU
1.2500 mg | INHALATION_SOLUTION | Freq: Four times a day (QID) | RESPIRATORY_TRACT | Status: DC
  Administered 2016-07-03 – 2016-07-20 (×69): 1.25 mg via RESPIRATORY_TRACT
  Filled 2016-07-03 (×69): qty 0.5

## 2016-07-03 NOTE — Consult Note (Signed)
Chief Complaint: Patient was seen in consultation today for left pleural effusion chest tube placement at the request of Dr Lovett Sox  Referring Physician(s): Dr Lovett Sox  Supervising Physician: Irish Lack  Patient Status: Inpatient  History of Present Illness: Ann Potter is a 75 y.o. female   Mitral valve replacement Tricuspid valve repair Maze and TEE  10/5 CXR revealing: IMPRESSION: 1.  Right PICC line stable position. 2. Progressive left pleural effusion with near opacification of the left hemi thorax. 3. Prior cardiac valve replacement. Congestive heart failure mild pulmonary interstitial edema and small right pleural effusion  Pt is SOB; productive cough Request made for Left pleural effusion pigtail catheter chest tube placement per Dr Maren Beach Dr Fredia Sorrow has reviewed imaging and approves procedure  INR 1.37 today---LD coumadin 10/5 6pm LD Lovenox 10/5 6pm   Past Medical History:  Diagnosis Date  . Anxiety   . Arthritis    "feet & legs" (04/16/2016)  . Asthma   . Atrial fibrillation (HCC)   . Atrial fibrillation, chronic (HCC) 01/25/2012   Status post DCCV, June 2013  . CAD (coronary artery disease)    Nonobstructive, by catheterization 4/13  . CHF (congestive heart failure) (HCC)   . Chronic diastolic congestive heart failure (HCC) 01/25/2012  . Chronic lower back pain   . Chronic renal insufficiency, stage III (moderate) 01/25/2012  . Chronic venous insufficiency   . COPD (chronic obstructive pulmonary disease) (HCC)   . Heart murmur   . HLD (hyperlipidemia)   . HTN (hypertension)   . Hypothyroidism   . Lower extremity edema   . Macular degeneration   . Mitral stenosis    Mild, by catheterization 4/13  . Morbid obesity (HCC)   . Myocardial infarction 2007  . Pulmonary hypertension    Moderate, by catheterization 4/13  . Rheumatic fever during childhood    Age 50-9  . Rheumatic heart disease   . S/P Maze operation for  atrial fibrillation 06/12/2016   Complete bilateral atrial lesion set using cryothermy and bipolar radiofrequency ablation with clipping of LA appendage  . S/P mitral valve replacement with bioprosthetic valve 06/08/2016   29 mm [facility] Mitral bovine pericardial tissue valve  . S/P tricuspid valve repair 06/26/2016   26 mm Edwards mc3 ring annuloplasty  . Tricuspid regurgitation   . Worries     Past Surgical History:  Procedure Laterality Date  . CARDIAC CATHETERIZATION  12/2011; 04/16/2016  . CARDIAC CATHETERIZATION N/A 04/16/2016   Procedure: Right/Left Heart Cath and Coronary Angiography;  Surgeon: Tonny Bollman, MD;  Location: [facility] INVASIVE CV LAB;  Service: Cardiovascular;  Laterality: N/A;  . CARDIOVERSION    . CATARACT EXTRACTION W/ INTRAOCULAR LENS  IMPLANT, BILATERAL Bilateral   . MAZE N/A 06/05/2016   Procedure: MAZE;  Surgeon: Purcell Nails, MD;  Location: [facility] OR;  Service: Open Heart Surgery;  Laterality: N/A;  . MITRAL VALVE REPLACEMENT N/A 06/02/2016   Procedure: MITRAL VALVE (MV) REPLACEMENT;  Surgeon: Purcell Nails, MD;  Location: [facility] OR;  Service: Open Heart Surgery;  Laterality: N/A;  . MULTIPLE TOOTH EXTRACTIONS  1965  . TEE WITH CARDIOVERSION     3 year ago  . TEE WITHOUT CARDIOVERSION N/A 06/25/2015   Procedure: TRANSESOPHAGEAL ECHOCARDIOGRAM (TEE);  Surgeon: Antoine Poche, MD;  Location: [facility] ENDO SUITE;  Service: Cardiology;  Laterality: N/A;  moved to 8:30 - Terri calling pt & Bernie  . TEE WITHOUT CARDIOVERSION N/A 06/09/2016   Procedure: TRANSESOPHAGEAL ECHOCARDIOGRAM (TEE);  Surgeon: Purcell Nailslarence H Owen, MD;  [facility];  Service: Open Heart Surgery;  Laterality: N/A;  . TRICUSPID VALVE REPLACEMENT N/A 06/15/2016   Procedure: TRICUSPID VALVE REPAIR;  Surgeon: Purcell Nailslarence H Owen, MD;  Location: [facility] OR;  Service: Open Heart Surgery;  Laterality: N/A;  . TUBAL LIGATION  1984    Allergies: Relafen [nabumetone]  Medications: Prior to Admission medications     Medication Sig Start Date End Date Taking? Authorizing Provider  acetaminophen (TYLENOL) 650 MG CR tablet Take 650 mg by mouth every 8 (eight) hours as needed for pain.   Yes Historical Provider, MD  albuterol (PROVENTIL HFA;VENTOLIN HFA) 108 (90 BASE) MCG/ACT inhaler Inhale 2 puffs into the lungs every 6 (six) hours as needed. For shortness of breath   Yes Historical Provider, MD  diltiazem (CARDIZEM) 30 MG tablet Take 1 tablet (30 mg total) by mouth 2 (two) times daily. 07/03/15  Yes Antoine PocheJonathan F Branch, MD  Fluticasone-Salmeterol (ADVAIR) 250-50 MCG/DOSE AEPB Inhale 1 puff into the lungs 2 (two) times daily.   Yes Historical Provider, MD  levothyroxine (SYNTHROID, LEVOTHROID) 50 MCG tablet Take 50 mcg by mouth daily.   Yes Historical Provider, MD  loratadine (CLARITIN) 10 MG tablet Take 10 mg by mouth as needed for allergies.    Yes Historical Provider, MD  metolazone (ZAROXOLYN) 2.5 MG tablet Take 1 tab every other week;may take weekly for additional swelling as needed. Patient taking differently: Take 2.5 mg by mouth See admin instructions. Take 2.5 mg by mouth every other week;may take weekly for additional swelling as needed. 05/21/16  Yes Antoine PocheJonathan F Branch, MD  montelukast (SINGULAIR) 10 MG tablet Take 10 mg by mouth daily.    Yes Historical Provider, MD  potassium chloride SA (K-DUR,KLOR-CON) 20 MEQ tablet Take 2 tablets (40 mEq total) by mouth 2 (two) times daily. 04/17/16  Yes Arty BaumgartnerLindsay B Roberts, NP  rivaroxaban (XARELTO) 20 MG TABS tablet Take 1 tablet (20 mg total) by mouth daily with supper. 10/01/15  Yes Antoine PocheJonathan F Branch, MD  rosuvastatin (CRESTOR) 10 MG tablet Take 10 mg by mouth daily.   Yes Historical Provider, MD  torsemide (DEMADEX) 20 MG tablet Take 4 tablets (80 mg total) by mouth 2 (two) times daily. 04/08/16  Yes Antoine PocheJonathan F Branch, MD     Family History  Problem Relation Age of Onset  . Heart disease Father 3630  . Deep vein thrombosis Father   . Cancer Mother     right breast    . Cancer Sister     brain cancer  . Stomach cancer Brother     Social History   Social History  . Marital status: Divorced    Spouse name: N/A  . Number of children: N/A  . Years of education: N/A   Social History Main Topics  . Smoking status: Former Smoker    Packs/day: 1.00    Years: 11.00    Types: Cigarettes    Start date: 03/08/1960  . Smokeless tobacco: Never Used     Comment: "quit smoking in ~  1990"  . Alcohol use No  . Drug use: No  . Sexual activity: Not Currently   Other Topics Concern  . None   Social History Narrative   Has 2 grown children   Lives alone     Review of Systems: A 12 point ROS discussed and pertinent positives are indicated in the HPI above.  All other systems are negative.  Review of Systems  Constitutional: Positive for  activity change and fatigue. Negative for fever.  HENT: Positive for hearing loss.   Respiratory: Positive for cough, choking and shortness of breath.   Cardiovascular: Negative for chest pain.  Neurological: Positive for weakness.  Psychiatric/Behavioral: Negative for behavioral problems and confusion.    Vital Signs: BP 115/63   Pulse 70   Temp 98 F (36.7 C)   Resp 18   Ht 5\' 1"  (1.549 m)   Wt 251 lb 8.7 oz (114.1 kg)   SpO2 96%   BMI 47.53 kg/m   Physical Exam  Constitutional: She is oriented to person, place, and time.  Cardiovascular: Normal rate and regular rhythm.   Pulmonary/Chest: She is in respiratory distress. She has wheezes.  Abdominal: Soft. There is no tenderness.  Musculoskeletal: Normal range of motion.  Neurological: She is alert and oriented to person, place, and time.  Skin: Skin is warm.  Psychiatric: She has a normal mood and affect. Her behavior is normal. Judgment and thought content normal.  Nursing note and vitals reviewed.   Mallampati Score:  MD Evaluation Airway: WNL Heart: WNL Abdomen: WNL Chest/ Lungs: WNL ASA  Classification: 3 Mallampati/Airway Score:  Two  Imaging: Dg Chest 2 View  Result Date: 06/22/2016 CLINICAL DATA:  Preop chest exam for mitral valve replacement September 29th. EXAM: CHEST  2 VIEW COMPARISON:  August 01, 2012 FINDINGS: The mediastinal contour is normal. The heart size is enlarged. There is mild pulmonary edema. There is no focal pneumonia or pleural effusion. Degenerative joint changes of the spine are noted. IMPRESSION: Cardiomegaly.  Mild pulmonary edema. Electronically Signed   By: Sherian Rein M.D.   On: 06/22/2016 16:28   Dg Chest Port 1 View  Result Date: 07/03/2016 CLINICAL DATA:  CHF.  Shortness of breath . EXAM: PORTABLE CHEST 1 VIEW COMPARISON:  07/02/2016.  06/30/2016. FINDINGS: Right PICC line stable position . Prior cardiac valve replacement. Left atrial appendage clip noted. Progressive left pleural effusion with near opacification of the right hemi thorax. Probable cardiomegaly. Mild interstitial prominence noted on right. A component congestive heart failure interstitial edema most likely present. Tiny right pleural effusion. IMPRESSION: 1.  Right PICC line stable position. 2. Progressive left pleural effusion with near opacification of the left hemi thorax. 3. Prior cardiac valve replacement. Congestive heart failure mild pulmonary interstitial edema and small right pleural effusion. Electronically Signed   By: Maisie Fus  Register   On: 07/03/2016 07:43   Dg Chest Port 1 View  Result Date: 07/02/2016 CLINICAL DATA:  Shortness of breath and chest pain following cardiac surgery EXAM: PORTABLE CHEST 1 VIEW COMPARISON:  06/30/2016 FINDINGS: Postsurgical changes are again seen. The cardiac shadow remains mildly enlarged. Persistent left basilar density is noted consistent with atelectasis/effusion. Right-sided PICC line is again noted. No new focal abnormality is seen. IMPRESSION: Stable changes in the left base. Postsurgical changes similar to that seen on the prior exam. Electronically Signed   By: Alcide Clever M.D.    On: 07/02/2016 07:49   Dg Chest Port 1 View  Result Date: 06/30/2016 CLINICAL DATA:  Chest soreness.  Follow-up atelectasis. EXAM: PORTABLE CHEST 1 VIEW COMPARISON:  06/29/2016 FINDINGS: Previous median sternotomy and valve replacement. Cardiomegaly persists. Near total collapse of left lower lobe persists. Venous hypertension with mild interstitial edema persists. Small amount of pleural fluid bilaterally persists. Right arm PICC has its tip in the SVC 3 cm above the right atrium. IMPRESSION: Similar to yesterday's exam. Cardiomegaly, pulmonary venous hypertension and mild interstitial edema. Small effusions.  Total or subtotal left lower lobe collapse. Electronically Signed   By: Paulina Fusi M.D.   On: 06/30/2016 07:35   Dg Chest Port 1 View  Result Date: 06/29/2016 CLINICAL DATA:  Central line placement today. EXAM: PORTABLE CHEST 1 VIEW COMPARISON:  Single-view of the chest earlier today. FINDINGS: A new right PICC is identified with its tip in the right atrium. The line should be withdrawn 4-5 cm for better positioning. Right IJ catheter is again seen. Bilateral effusions and airspace disease have worsened since the study earlier today. There is cardiomegaly. No pneumothorax. IMPRESSION: Tip of right PICC is in the right atrium. Recommend withdrawal of 4-5 cm. Increased bilateral effusions and airspace disease. Electronically Signed   By: Drusilla Kanner M.D.   On: 06/29/2016 21:47   Dg Chest Port 1 View  Result Date: 06/29/2016 CLINICAL DATA:  Cardiac surgery.  Sore chest.  Atelectasis. EXAM: PORTABLE CHEST 1 VIEW COMPARISON:  06/28/2016. FINDINGS: Interim removal of bilateral chest tubes. Right IJ sheath in stable position. Prior median sternotomy and cardiac valve replacements. Left atrial appendage clip noted stable position. Cardiomegaly. Interim increase in interstitial markings suggesting pulmonary interstitial edema. Low lung volumes with bibasilar atelectasis and/or infiltrates. Low lung  volumes. Bilateral pleural effusions, left side greater than right. No pneumothorax. IMPRESSION: 1. Interim removal of bilateral chest tubes.  No pneumothorax. 2. Prior cardiac valve replacements. Cardiomegaly. Increased interstitial prominence bilateral consistent with congestive heart failure with pulmonary interstitial edema. Bilateral pleural effusions, left side greater right again noted. Low lung volumes. Electronically Signed   By: Maisie Fus  Register   On: 06/29/2016 07:31   Dg Chest Port 1 View  Result Date: 06/28/2016 CLINICAL DATA:  75 y/o  F; atelectasis. EXAM: PORTABLE CHEST 1 VIEW COMPARISON:  06/27/2016 chest radiograph. FINDINGS: Stable cardiomediastinal silhouette given projection and technique. Post median sternotomy, mitral, and aortic valve replacement. Small left greater than right effusions are stable with bibasilar opacities probably representing associated atelectasis. Bilateral chest tubes. Interval removal of a mediastinal drain. No appreciable pneumothorax. Right central venous catheter tip projects over mid SVC. Pulmonary venous hypertension. IMPRESSION: Stable small bilateral effusions and associated basilar atelectasis. Electronically Signed   By: Mitzi Hansen M.D.   On: 06/28/2016 05:46   Dg Chest Port 1 View  Result Date: 06/27/2016 CLINICAL DATA:  Medical hx: asthma, CHF, hypertension. Routine checking for atelectasis. EXAM: PORTABLE CHEST 1 VIEW COMPARISON:  06/26/2016 FINDINGS: Since the prior study, the endotracheal tube and nasogastric tube have been removed. The Swan-Ganz catheter has also been removed. The introducer sheath remains positioned with its tip in the mid to upper superior vena cava. Bilateral chest tubes and mediastinal tube are stable. Changes from cardiac surgery and valve replacement are again noted. No mediastinal widening. Cardiac silhouette is mildly enlarged. There is central vascular congestion, mild central interstitial thickening and hazy  opacity in both lung bases likely combination of atelectasis and pleural fluid. No pneumothorax. IMPRESSION: 1. Status post extubation and removal of the Swan-Ganz catheter and the nasal/orogastric tube since the previous day's exam. 2. Appearance of the lungs is similar with vascular congestion, mild central interstitial thickening and hazy basilar opacity. Mild interstitial pulmonary edema is suspected along with small pleural effusions. No new lung abnormalities. Electronically Signed   By: Amie Portland M.D.   On: 06/27/2016 08:09   Dg Chest Port 1 View  Result Date: 06/26/2016 CLINICAL DATA:  Intubation. EXAM: PORTABLE CHEST 1 VIEW COMPARISON:  06/12/2016. FINDINGS: Endotracheal tube, NG tube, Swan-Ganz  catheter, mediastinal drainage catheter, left chest tube in stable position. No pneumothorax. Prior CABG. Left atrial appendage clip noted. Cardiomegaly. Mild bilateral from interstitial prominence and small pleural effusions. Findings consistent congestive heart failure. Low lung volumes with bibasilar atelectasis . IMPRESSION: 1. Lines and tubes in stable position. 2. Prior CABG. Cardiomegaly with mild bilateral from interstitial prominence and small pleural effusions. Findings consistent mild congestive heart failure. 3. Lung volumes with bibasilar atelectasis. Electronically Signed   By: Maisie Fus  Register   On: 06/26/2016 07:27   Dg Chest Port 1 View  Result Date: 07-19-2016 CLINICAL DATA:  Post mitral valve replacement. Chest tube intubation. Atelectasis. EXAM: PORTABLE CHEST 1 VIEW COMPARISON:  06/22/2016 FINDINGS: Patient has undergone interval median sternotomy and mitral valve replacement. Endotracheal tube has tip 5 cm above the carina. Nasogastric tube has tip over the stomach in the left upper quadrant. Mediastinal drain is present. Bilateral chest tubes appears in adequate position. Right IJ Swan-Ganz catheter has tip over the proximal right main pulmonary. Lungs are adequately inflated with  minimal left basilar opacification likely atelectasis. No pneumothorax. Mild stable cardiomegaly. Remainder of the exam is unchanged. IMPRESSION: Mild left basilar atelectasis.  Stable cardiomegaly. Multiple tubes and lines as described. Electronically Signed   By: Elberta Fortis M.D.   On: 2016-07-19 15:01    Labs:  CBC:  Recent Labs  06/29/16 0345 06/30/16 0342 07/02/16 0335 07/02/16 1749 07/03/16 0400  WBC 8.4 6.3 8.0  --  7.9  HGB 8.2* 8.3* 8.5* 8.2* 8.1*  HCT 25.7* 25.7* 27.2* 24.0* 25.9*  PLT 154 176 238  --  242    COAGS:  Recent Labs  06/22/16 1309 07/19/2016 1506  06/30/16 0342 07/01/16 0322 07/02/16 0335 07/03/16 0400  INR 1.06 1.38  < > 1.21 1.17 1.26 1.37  APTT 39* 39*  --   --   --   --   --   < > = values in this interval not displayed.  BMP:  Recent Labs  07/01/16 0322 07/01/16 1818 07/02/16 0335 07/02/16 1749 07/03/16 0400  NA 134* 134* 135 132* 135  K 2.5* 3.1* 3.7 3.9 4.2  CL 93* 91* 92* 88* 88*  CO2 32 33* 32  --  33*  GLUCOSE 129* 110* 119* 95 104*  BUN 47* 45* 46* 46* 48*  CALCIUM 8.7* 8.9 9.0  --  8.8*  CREATININE 1.29* 1.24* 1.22* 1.60* 1.28*  GFRNONAA 39* 41* 42*  --  40*  GFRAA 45* 48* 49*  --  46*    LIVER FUNCTION TESTS:  Recent Labs  06/22/16 1309 06/30/16 0342 07/03/16 0400  BILITOT 1.3* 1.5* 1.2  AST 23 26 28   ALT 12* 17 16  ALKPHOS 95 70 92  PROT 7.0 4.9* 5.5*  ALBUMIN 3.3* 2.3* 2.3*    TUMOR MARKERS: No results for input(s): AFPTM, CEA, CA199, CHROMGRNA in the last 8760 hours.  Assessment and Plan:  Large left pleural effusion---possible plugging Post cardiac surgery 8 days ago Scheduled for Left pleural effusion pigtail catheter chest tube placement Risks and Benefits discussed with the patient including bleeding, infection, damage to adjacent structures, pneumothorax and sepsis. All of the patient's questions were answered, patient is agreeable to proceed. Consent signed and in chart.   Thank you for  this interesting consult.  I greatly enjoyed meeting Velora Horstman and look forward to participating in their care.  A copy of this report was sent to the requesting provider on this date.  Electronically Signed: Robet Leu 07/03/2016,  9:35 AM   I spent a total of 40 Minutes    in face to face in clinical consultation, greater than 50% of which was counseling/coordinating care for left chest tube placement

## 2016-07-03 NOTE — Care Management Important Message (Signed)
Important Message  Patient Details  Name: Ann PoeGloria Potter MRN: [id number] Date of Birth: Nov 22, 1940   Medicare Important Message Given:  Yes    Kyla BalzarineShealy, Ankur Snowdon [name] 07/03/2016, 12:46 PM

## 2016-07-03 NOTE — Progress Notes (Signed)
Patient ID: Ann Potter, female   DOB: 01/25/1941, 75 y.o.   MRN: [id number] EVENING ROUNDS NOTE :     [address]       [place] [address]             [phone]                 8 Days Post-Op Procedure(s) (LRB): MITRAL VALVE (MV) REPLACEMENT (N/A) TRICUSPID VALVE REPAIR (N/A) MAZE (N/A) TRANSESOPHAGEAL ECHOCARDIOGRAM (TEE) (N/A)  Total Length of Stay:  LOS: 8 days  BP 121/60   Pulse 73   Temp 98.3 F (36.8 C) (Oral)   Resp (!) 26   Ht 5\' 1"  (1.549 m)   Wt 251 lb 8.7 oz (114.1 kg)   SpO2 95%   BMI 47.53 kg/m   .Intake/Output      10/05 0701 - 10/06 0700 10/06 0701 - 10/07 0700   P.O. 360 60   I.V. (mL/kg) 547 (4.8) 264 (2.3)   IV Piggyback     Total Intake(mL/kg) 907 (7.9) 324 (2.8)   Urine (mL/kg/hr) 2545 (0.9) 1120 (0.8)   Stool     Total Output 2545 1120   Net -1638 -796        Urine Occurrence 1 x      . sodium chloride 250 mL (07/03/16 0944)  . furosemide (LASIX) infusion 14 mg/hr (07/03/16 1639)     Lab Results  Component Value Date   WBC 7.9 07/03/2016   HGB 8.1 (L) 07/03/2016   HCT 25.9 (L) 07/03/2016   PLT 242 07/03/2016   GLUCOSE 104 (H) 07/03/2016   ALT 16 07/03/2016   AST 28 07/03/2016   NA 135 07/03/2016   K 4.2 07/03/2016   CL 88 (L) 07/03/2016   CREATININE 1.28 (H) 07/03/2016   BUN 48 (H) 07/03/2016   CO2 33 (H) 07/03/2016   INR 1.37 07/03/2016   HGBA1C 5.3 06/22/2016   Ct Chest Wo Contrast  Result Date: 07/03/2016 CLINICAL DATA:  Shortness of breath and status post cardiac surgery with tricuspid and mitral valve replacement as well as Maze procedure. Chest x-ray demonstrates development of increasing opacification of the left lung and probable development of a loculated left pleural effusion. The patient presents for imaging prior to possible pigtail chest tube placement to drain the left pleural effusion. EXAM: CT CHEST WITHOUT CONTRAST TECHNIQUE: Multidetector CT imaging of the chest was performed following  the standard protocol without IV contrast. COMPARISON:  Chest x-ray earlier today. FINDINGS: Cardiovascular: There is a small amount of pericardial fluid. Prosthetic mitral and tricuspid valves present. Left atrial appendage clip also visualized. Mediastinum/Nodes: No evidence of mediastinal hemorrhage or air. No lymphadenopathy identified. Lungs/Pleura: There is dense atelectasis and collapse of the left upper lobe and lingula. There also is significant mucus plug formation in the left lower lobe with atelectasis of the left lower lobe. A small partially loculated pleural effusion is present. There also is a small right pleural effusion. No overt pulmonary edema. No pneumothorax identified. Upper Abdomen: Visualized upper abdominal structures are unremarkable. Musculoskeletal: Evidence of median sternotomy.  No other findings. IMPRESSION: Dense atelectasis and collapse of the left upper lobe and lingula. There also is atelectasis of the left lower lobe with mucous plugging in the lower lobe bronchi. There is only a small partially loculated left pleural effusion and also a small right pleural effusion. Decision was made not to place a percutaneous chest tube today after discussion with Dr. Donata Clay. Electronically  Signed   By: Irish LackGlenn  Yamagata M.D.   On: 07/03/2016 16:43   Dg Chest Port 1 View  Result Date: 07/03/2016 CLINICAL DATA:  Shortness of breath. Status post chest PET and suction. EXAM: PORTABLE CHEST 1 VIEW COMPARISON:  Single-view of the chest earlier today. FINDINGS: Near complete whiteout of the left chest consistent with pleural effusion and extensive airspace disease is unchanged. Small right pleural effusion is noted. The patient is status post clipping of the atrial appendage and mitral and tricuspid valve repair. The cardiac silhouette is largely obscured. No pneumothorax. IMPRESSION: No change in near complete whiteout left chest consistent with effusion and airspace disease. Small right  pleural effusion is also unchanged. Electronically Signed   By: Drusilla Kannerhomas  Dalessio M.D.   On: 07/03/2016 16:16   Dg Chest Port 1 View  Result Date: 07/03/2016 CLINICAL DATA:  CHF.  Shortness of breath . EXAM: PORTABLE CHEST 1 VIEW COMPARISON:  07/02/2016.  06/30/2016. FINDINGS: Right PICC line stable position . Prior cardiac valve replacement. Left atrial appendage clip noted. Progressive left pleural effusion with near opacification of the right hemi thorax. Probable cardiomegaly. Mild interstitial prominence noted on right. A component congestive heart failure interstitial edema most likely present. Tiny right pleural effusion. IMPRESSION: 1.  Right PICC line stable position. 2. Progressive left pleural effusion with near opacification of the left hemi thorax. 3. Prior cardiac valve replacement. Congestive heart failure mild pulmonary interstitial edema and small right pleural effusion. Electronically Signed   By: Maisie Fushomas  Register   On: 07/03/2016 07:43   Acute work on pulmonary status, no pulmonary distress but ct  Shows collapse rather then fluid Now on mucomyst and fortaz    Delight Ovensdward B Malachi Kinzler MD  Beeper [phone] Office [phone] 07/03/2016 6:43 PM

## 2016-07-03 NOTE — Progress Notes (Signed)
CPT not done at this time because the patient is in a procedure off the unit.

## 2016-07-03 NOTE — Care Management Important Message (Signed)
Important Message  Patient Details  Name: Ann Potter MRN: [id number] Date of Birth: Jul 24, 1941   Medicare Important Message Given:  Yes    [name] 07/03/2016, 1:01 PM

## 2016-07-03 NOTE — Progress Notes (Signed)
Patient ID: [name], female   DOB: August 22, 1941, 75 y.o.   MRN: [id number]   Interventional Radiology Note:  Patient brought down for possible CT guided left chest tube placement.  CT shows densely atelectatic LUL and lingula and mucus plugging in LLL.  Only small, partially loculated left effusion.  Small right effusion also present.  No need for drain placement today.  Findings discussed with Dr. Donata ClayVan Trigt over telephone.  Full chest CT report will be dictated.  Ann Potter, M.D Pager:  [phone]

## 2016-07-03 NOTE — Sedation Documentation (Signed)
Procedure cancelled d/w Dr Alla GermanVantright per Dr Towanda MalkinYama, pt updated and RN

## 2016-07-03 NOTE — Progress Notes (Signed)
Physical Therapy Treatment Patient Details Name: Ann Potter MRN: [id number] DOB: December 26, 1940 Today's Date: 07/03/2016    History of Present Illness Patient is a 75 yo female admitted 04/24/2016 due to 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   PMH:  morbid obesity, HOH, poor eyesight, mitral stenosis, tricuspid regurg, rheumatic heart disease, anxiety, Afib, CAD, MI, CHF, venous insufficiency, chronic LE edema, HTN, CKD, asthma, chronic back pain    PT Comments    Pt with slow progression but limited by coughing and fatigue today. Pt on 2L at beginning of session with drop to 89% with transition to EOB, on 4L able to maintain 95% with activity, HR 70-80. Pt educated for transfers, gait and precautions as remains unable to recall and maintain without assist. Encouraged mobility with nursing throughout the day. Will continue to follow.  BP 133/48 beginning 128/68 after ambulation  Follow Up Recommendations  SNF;Supervision/Assistance - 24 hour     Equipment Recommendations       Recommendations for Other Services       Precautions / Restrictions Precautions Precautions: Sternal;Fall Precaution Comments:  Reviewed sternal precautions Restrictions Weight Bearing Restrictions: Yes (Sternal precautions)    Mobility  Bed Mobility Overal bed mobility: Needs Assistance;+2 for physical assistance Bed Mobility: Sit to Supine;Supine to Sit     Supine to sit: Max assist;+2 for physical assistance;HOB elevated Sit to supine: Max assist;+2 for physical assistance   General bed mobility comments: cues for rolling and assist with pt performing partial roll and semiside to sit from right, difficult to achieve full sidelying due to body habitus. Assist for elevating trunk and pivoting pelvis to sit. With return to bed assist to control trunk and bring both legs onto surface  Transfers Overall transfer level: Needs assistance    Transfers: Sit to/from Stand Sit to Stand: Min assist;+2 safety/equipment         General transfer comment: cues for hand placement with assist for anterior translation and elevation x 2 trials   Ambulation/Gait Ambulation/Gait assistance: Min assist;+2 safety/equipment Ambulation Distance (Feet): 25 Feet Assistive device: Rolling walker (2 wheeled)     Gait velocity interpretation: Below normal speed for age/gender General Gait Details: cues for posture and safety with pt maintaining flexed posture, chair to follow as pt with poor activity tolerance    Stairs            Wheelchair Mobility    Modified Rankin (Stroke Patients Only)       Balance Overall balance assessment: Needs assistance   Sitting balance-Leahy Scale: Fair Sitting balance - Comments: EOB 7 min due to increased coughing, minguard for balance, assist for pillow and sternal precautions                            Cognition Arousal/Alertness: Awake/alert Behavior During Therapy: WFL for tasks assessed/performed Overall Cognitive Status: Within Functional Limits for tasks assessed       Memory: Decreased recall of precautions              Exercises      General Comments        Pertinent Vitals/Pain Pain Assessment: No/denies pain    Home Living                      Prior Function            PT Goals (current goals can  now be found in the care plan section) Progress towards PT goals: Progressing toward goals    Frequency           PT Plan Current plan remains appropriate    Co-evaluation             End of Session Equipment Utilized During Treatment: Gait belt;Oxygen Activity Tolerance: Patient tolerated treatment well Patient left: in bed;with call bell/phone within reach;with nursing/sitter in room     Time: 0905-0932 PT Time Calculation (min) (ACUTE ONLY): 27 min  Charges:  $Gait Training: 8-22 mins $Therapeutic Activity: 8-22  mins                    G Codes:      Delorse Lek July 13, 2016, 10:29 AM [name], PT [phone]

## 2016-07-03 NOTE — Progress Notes (Addendum)
[address]       [place] [id number]             [phone]      8 Days Post-Op Procedure(s) (LRB): MITRAL VALVE (MV) REPLACEMENT (N/A) TRICUSPID VALVE REPAIR (N/A) MAZE (N/A) TRANSESOPHAGEAL ECHOCARDIOGRAM (TEE) (N/A) Subjective: More short of breath this morning.   Objective: Vital signs in last 24 hours: Temp:  [97.7 F (36.5 C)-98.4 F (36.9 C)] 97.9 F (36.6 C) (10/06 0400) Pulse Rate:  [43-82] 82 (10/06 0700) Cardiac Rhythm: Junctional rhythm (10/06 0400) Resp:  [16-23] 23 (10/06 0700) BP: (102-126)/(38-71) 112/57 (10/06 0700) SpO2:  [90 %-100 %] 93 % (10/06 0700) Weight:  [251 lb 8.7 oz (114.1 kg)] 251 lb 8.7 oz (114.1 kg) (10/06 0600)     Intake/Output from previous day: 10/05 0701 - 10/06 0700 In: 907 [P.O.:360; I.V.:547] Out: 2485 [Urine:2485] Intake/Output this shift: No intake/output data recorded.  General appearance: alert, cooperative and no distress Heart: NSR with PVCs Lungs: clear to auscultation bilaterally, diminished in lower lobes bilaterally, diffuse wheezes Abdomen: soft, non-tender; bowel sounds normal; no masses,  no organomegaly Extremities: edema 3+ pitting pedal edema Wound: c/d/i without drainage  Lab Results:  Recent Labs  07/02/16 0335 07/02/16 1749 07/03/16 0400  WBC 8.0  --  7.9  HGB 8.5* 8.2* 8.1*  HCT 27.2* 24.0* 25.9*  PLT 238  --  242   BMET:  Recent Labs  07/02/16 0335 07/02/16 1749 07/03/16 0400  NA 135 132* 135  K 3.7 3.9 4.2  CL 92* 88* 88*  CO2 32  --  33*  GLUCOSE 119* 95 104*  BUN 46* 46* 48*  CREATININE 1.22* 1.60* 1.28*  CALCIUM 9.0  --  8.8*    PT/INR:  Recent Labs  07/03/16 0400  LABPROT 17.0*  INR 1.37   ABG    Component Value Date/Time   PHART 7.337 (L) 06/26/2016 0859   HCO3 23.2 06/26/2016 0859   TCO2 34 07/02/2016 1749   ACIDBASEDEF 3.0 (H) 06/26/2016 0859   O2SAT 72.3 07/01/2016 0325   CBG (last 3)   Recent Labs  07/02/16 0720 07/02/16 1140  07/02/16 2153  GLUCAP 117* 141* 128*    Assessment/Plan: S/P Procedure(s) (LRB): MITRAL VALVE (MV) REPLACEMENT (N/A) TRICUSPID VALVE REPAIR (N/A) MAZE (N/A) TRANSESOPHAGEAL ECHOCARDIOGRAM (TEE) (N/A)  1. CV-NSR 70s with PVCs, stable BP. No Beta blocker. Permanent atrial fibrillation now s/p maze. EPW out. 2. Acute on chronic diastolic heart failure with expected fluid overload. -1.5L yesterday. Weight stable. Remains 12 lbs fluid overloaded. Increased Lasix drip yesterday. Continue Spirolactone and  Zaroxolyn. Encourage Is. Added flutter valve. Will add mucinex and chest physiotherapy 3. Pulm-Pulmonary hypertension. Tolerating 2L [place]. Continue nebs and Singulair. CXR today shows increased atelectasis, L > R. Will likely need bronch vs. Chest tube 4. Renal- CKD stage III, creatinine came back down to  1.28. Potassium improved today. Will replace as needed 5. Expected acute blood loss anemia- H & H stable 6. Morbid obesity, working with PT/OT for increased mobilization 7. Endocrine-blood glucose levels well controlled. SSI 8. Anticoagulation- Coumadin 5mg  daily. INR 1.37  Plan: Continue ICU care. HR and BP stable. Continue lasix drip and PO spirolactone and Zaroxolyn.  Chest xray worse today with left large pleural effusion vs. Mucous plug. Attending aware. Possible chest tube vs. Bronch. Add mucinex for secretions. Chest physiotherapy. Keep in ICU due to severe limited mobility requiring a three person assist. Patient will need a SNF for  rehab-the patient hopes to go to [facility].    LOS: 8 days    Sharlene Dory 07/03/2016  CT chest shows small loculated effusion at apex with mainly atelectasis from mucus plugging. IR deferred placement of pig tailed drain for effusion NTS'ed personally of large amount bloody secretions sent for C+S. Breath sounds now improved on L Will add mucomyst /xopenex nebs Start [place] ans wait for cultures. patient examined and medical record reviewed,agree  with above note. Kathlee Nations Trigt III 07/03/2016

## 2016-07-03 NOTE — Significant Event (Signed)
Patient is returned from IR. [facility] also reported that one port of PICC is clotted off.  RN unable to flush port when patient was back in 2S01. Consult to IV team made.

## 2016-07-04 ENCOUNTER — Inpatient Hospital Stay (HOSPITAL_COMMUNITY): Payer: Medicare Other

## 2016-07-04 DIAGNOSIS — J811 Chronic pulmonary edema: Secondary | ICD-10-CM

## 2016-07-04 LAB — PROTIME-INR
INR: 1.69
Prothrombin Time: 20 seconds — ABNORMAL HIGH (ref 11.4–15.2)

## 2016-07-04 LAB — COMPREHENSIVE METABOLIC PANEL
ALT: 16 U/L (ref 14–54)
AST: 30 U/L (ref 15–41)
Albumin: 2.2 g/dL — ABNORMAL LOW (ref 3.5–5.0)
Alkaline Phosphatase: 89 U/L (ref 38–126)
Anion gap: 13 (ref 5–15)
BUN: 49 mg/dL — ABNORMAL HIGH (ref 6–20)
CO2: 35 mmol/L — ABNORMAL HIGH (ref 22–32)
Calcium: 8.5 mg/dL — ABNORMAL LOW (ref 8.9–10.3)
Chloride: 89 mmol/L — ABNORMAL LOW (ref 101–111)
Creatinine, Ser: 1.31 mg/dL — ABNORMAL HIGH (ref 0.44–1.00)
GFR calc Af Amer: 45 mL/min — ABNORMAL LOW (ref >60)
GFR calc non Af Amer: 38 mL/min — ABNORMAL LOW (ref >60)
Glucose, Bld: 104 mg/dL — ABNORMAL HIGH (ref 65–99)
Potassium: 3.4 mmol/L — ABNORMAL LOW (ref 3.5–5.1)
Sodium: 137 mmol/L (ref 135–145)
Total Bilirubin: 0.9 mg/dL (ref 0.3–1.2)
Total Protein: 5.1 g/dL — ABNORMAL LOW (ref 6.5–8.1)

## 2016-07-04 LAB — GLUCOSE, CAPILLARY
GLUCOSE-CAPILLARY: 119 mg/dL — AB (ref 65–99)
GLUCOSE-CAPILLARY: 149 mg/dL — AB (ref 65–99)
GLUCOSE-CAPILLARY: 105 mg/dL — AB (ref 65–99)
Glucose-Capillary: 125 mg/dL — ABNORMAL HIGH (ref 65–99)

## 2016-07-04 LAB — CBC
HCT: 24.3 % — ABNORMAL LOW (ref 36.0–46.0)
Hemoglobin: 7.7 g/dL — ABNORMAL LOW (ref 12.0–15.0)
MCH: 31.3 pg (ref 26.0–34.0)
MCHC: 31.7 g/dL (ref 30.0–36.0)
MCV: 98.8 fL (ref 78.0–100.0)
Platelets: 243 10*3/uL (ref 150–400)
RBC: 2.46 MIL/uL — ABNORMAL LOW (ref 3.87–5.11)
RDW: 15.9 % — ABNORMAL HIGH (ref 11.5–15.5)
WBC: 7.7 10*3/uL (ref 4.0–10.5)

## 2016-07-04 MED ORDER — LIDOCAINE HCL (PF) 2 % IJ SOLN: 120.0000 mg | Freq: Once | INTRAMUSCULAR | Status: DC | PRN

## 2016-07-04 MED ORDER — LIDOCAINE HCL 2 % IJ SOLN
6.0000 mL | Freq: Once | INTRAMUSCULAR | Status: DC | PRN
  Filled 2016-07-04: qty 10

## 2016-07-04 MED ORDER — POTASSIUM CHLORIDE 10 MEQ/50ML IV SOLN
10.0000 meq | INTRAVENOUS | Status: AC
  Administered 2016-07-04 (×3): 10 meq via INTRAVENOUS
  Filled 2016-07-04: qty 50

## 2016-07-04 MED ORDER — LIDOCAINE HCL (PF) 1 % IJ SOLN
INTRAMUSCULAR | Status: AC
  Administered 2016-07-04: 30 mL
  Filled 2016-07-04: qty 30

## 2016-07-04 MED ORDER — LIDOCAINE HCL 2 % EX GEL
1.0000 "application " | Freq: Once | CUTANEOUS | Status: AC
  Administered 2016-07-04: 1
  Filled 2016-07-04: qty 5

## 2016-07-04 MED ORDER — MIDAZOLAM HCL 2 MG/2ML IJ SOLN
INTRAMUSCULAR | Status: AC
  Administered 2016-07-04: 2 mg
  Filled 2016-07-04: qty 2

## 2016-07-04 MED ORDER — ALBUMIN HUMAN 25 % IV SOLN
12.5000 g | INTRAVENOUS | Status: DC | PRN
  Filled 2016-07-04: qty 50

## 2016-07-04 MED ORDER — LIDOCAINE HCL (CARDIAC) 20 MG/ML IV SOLN
INTRAVENOUS | Status: AC
  Filled 2016-07-04: qty 5

## 2016-07-04 NOTE — Progress Notes (Signed)
CPT not done at this time due to RN request and pt is currently sleeping following bronch procedure.

## 2016-07-04 NOTE — Progress Notes (Signed)
Bronchoscopy preformed after consent obtained. Gave PRN medications as ordered by Dr. Tyrone SageGerhardt. Tolerated procedure well. Vital signs stable. Will continue to monitor.

## 2016-07-04 NOTE — Procedures (Signed)
      [address]       [place] [id number]             [phone]      Bronchoscopy Procedure Note Ann PoeGloria Potter [id number] 10-17-1940  Procedure: Bronchoscopy Indications: Remove secretions  Procedure Details Consent: Risks of procedure as well as the alternatives and risks of each were explained to the (patient/caregiver).  Consent for procedure obtained. Time Out: Verified patient identification, verified procedure, site/side was marked, verified correct patient position, special equipment/implants available, medications/allergies/relevent history reviewed, required imaging and test results available.  Performed  In preparation for procedure, patient was given 100% FiO2, bronchoscope lubricated, inhaled beta agonist administered and lidocaine given via nasal swab and on cords and nebulizer . Sedation: versed   Airway entered and the following bronchi were examined: RUL, RML, RLL, LUL and LLL.   Procedures performed: Brushings performed Bronchoscope removed.  , Patient placed back on 100% FiO2 at conclusion of procedure.    Evaluation Hemodynamic Status: BP stable throughout; O2 sats: transiently fell during during procedure Patient's Current Condition: stable Specimens:  None and cultured yesterday Complications: No apparent complications Patient did tolerate procedure well.   Ann OvensEdward B Kamyiah Potter 07/04/2016

## 2016-07-04 NOTE — Progress Notes (Addendum)
Patient ID: Sherle PoeGloria Gosdin, female   DOB: 1941/02/03, 75 y.o.   MRN: [id number] EVENING ROUNDS NOTE :     [address]       [place] [id number]             [phone]                 9 Days Post-Op Procedure(s) (LRB): MITRAL VALVE (MV) REPLACEMENT (N/A) TRICUSPID VALVE REPAIR (N/A) MAZE (N/A) TRANSESOPHAGEAL ECHOCARDIOGRAM (TEE) (N/A)  Total Length of Stay:  LOS: 9 days  BP (!) 92/46   Pulse (!) 59   Temp 98 F (36.7 C) (Oral)   Resp 16   Ht 5\' 1"  (1.549 m)   Wt 242 lb 15.2 oz (110.2 kg)   SpO2 100%   BMI 45.90 kg/m   .Intake/Output      10/06 0701 - 10/07 0700 10/07 0701 - 10/08 0700   P.O. 60    I.V. (mL/kg) 576 (5.2) 154 (1.4)   IV Piggyback 100 200   Total Intake(mL/kg) 736 (6.7) 354 (3.2)   Urine (mL/kg/hr) 2940 (1.1) 1400 (1.1)   Stool 0 (0)    Total Output 2940 1400   Net -2204 -1046        Stool Occurrence 1 x      . sodium chloride 250 mL (07/03/16 0944)  . furosemide (LASIX) infusion 14 mg/hr (07/04/16 1800)     Lab Results  Component Value Date   WBC 7.7 07/04/2016   HGB 7.7 (L) 07/04/2016   HCT 24.3 (L) 07/04/2016   PLT 243 07/04/2016   GLUCOSE 104 (H) 07/04/2016   ALT 16 07/04/2016   AST 30 07/04/2016   NA 137 07/04/2016   K 3.4 (L) 07/04/2016   CL 89 (L) 07/04/2016   CREATININE 1.31 (H) 07/04/2016   BUN 49 (H) 07/04/2016   CO2 35 (H) 07/04/2016   INR 1.69 07/04/2016   HGBA1C 5.3 06/22/2016   Stable day, with 2 people only walks 50-60 steps resp stable after bronch, comfortable breathing , follow up chest xray pending  bp labile with lasix drip, will decrease rate of lasix to 7mg /hr  Delight OvensEdward B Myan Suit MD  Beeper [phone] Office [phone] 07/04/2016 6:49 PM

## 2016-07-04 NOTE — Progress Notes (Signed)
TCTS DAILY ICU PROGRESS NOTE                   [address]            [place] [address]          [phone]   9 Days Post-Op Procedure(s) (LRB): MITRAL VALVE (MV) REPLACEMENT (N/A) TRICUSPID VALVE REPAIR (N/A) MAZE (N/A) TRANSESOPHAGEAL ECHOCARDIOGRAM (TEE) (N/A)  Total Length of Stay:  LOS: 9 days   Subjective: Up in chair ., alert, no respiratory distress , chest xray not improved with current rep treatment   Objective: Vital signs in last 24 hours: Temp:  [97.7 F (36.5 C)-98.3 F (36.8 C)] 98.1 F (36.7 C) (10/07 0723) Pulse Rate:  [58-73] 65 (10/07 0732) Cardiac Rhythm: Atrial fibrillation (10/07 0700) Resp:  [16-26] 25 (10/07 0732) BP: (76-131)/(44-71) 99/48 (10/07 0732) SpO2:  [93 %-100 %] 99 % (10/07 0732) Weight:  [242 lb 15.2 oz (110.2 kg)] 242 lb 15.2 oz (110.2 kg) (10/07 0500)  Filed Weights   07/02/16 0300 07/03/16 0600 07/04/16 0500  Weight: 251 lb 1.7 oz (113.9 kg) 251 lb 8.7 oz (114.1 kg) 242 lb 15.2 oz (110.2 kg)    Weight change: -8 lb 9.6 oz (-3.9 kg)   Hemodynamic parameters for last 24 hours:    Intake/Output from previous day: 10/06 0701 - 10/07 0700 In: 736 [P.O.:60; I.V.:576; IV Piggyback:100] Out: 2940 [Urine:2940]  Intake/Output this shift: No intake/output data recorded.  Current Meds: Scheduled Meds: . acetylcysteine  3 mL Nebulization Q6H  . aspirin EC  81 mg Oral Daily  . bisacodyl  10 mg Oral Daily   Or  . bisacodyl  10 mg Rectal Daily  . cefTAZidime (FORTAZ)  IV  1 g Intravenous Q8H  . docusate sodium  200 mg Oral Daily  . folic acid-pyridoxine-cyancobalamin  1 tablet Oral Daily  . guaiFENesin  1,200 mg Oral BID  . insulin aspart  0-15 Units Subcutaneous TID WC  . insulin aspart  0-5 Units Subcutaneous QHS  . iron polysaccharides  150 mg Oral Daily  . levalbuterol  1.25 mg Nebulization Q6H  . levothyroxine  50 mcg Oral QAC breakfast  . mouth rinse  15 mL Mouth Rinse BID  . metolazone  5 mg Oral  Daily  . montelukast  10 mg Oral Daily  . pantoprazole  40 mg Oral Daily  . potassium chloride  10 mEq Intravenous Q1 Hr x 3  . potassium chloride  40 mEq Oral TID  . rosuvastatin  10 mg Oral Daily  . sodium chloride flush  10-40 mL Intracatheter Q12H  . sodium chloride flush  3 mL Intravenous Q12H  . spironolactone  25 mg Oral Daily  . warfarin  5 mg Oral q1800  . Warfarin - Physician Dosing Inpatient   Does not apply q1800   Continuous Infusions: . sodium chloride 250 mL (07/03/16 0944)  . furosemide (LASIX) infusion 14 mg/hr (07/04/16 0700)   PRN Meds:.acetaminophen, ipratropium-albuterol, metoprolol, ondansetron (ZOFRAN) IV, oxyCODONE, sodium chloride flush, sodium chloride flush, traMADol  General appearance: alert, cooperative and no distress Neurologic: intact Heart: regular rate and rhythm, S1, S2 normal, no murmur, click, rub or gallop Lungs: diminished breath sounds LLL and LUL Abdomen: soft, non-tender; bowel sounds normal; no masses,  no organomegaly Extremities: extremities normal, atraumatic, no cyanosis or edema and Homans sign is negative, no sign of DVT Wound: intact, no pacing wires   Lab Results: CBC: Recent Labs  07/03/16 0400  07/04/16 0400  WBC 7.9 7.7  HGB 8.1* 7.7*  HCT 25.9* 24.3*  PLT 242 243   BMET:  Recent Labs  07/03/16 0400 07/04/16 0400  NA 135 137  K 4.2 3.4*  CL 88* 89*  CO2 33* 35*  GLUCOSE 104* 104*  BUN 48* 49*  CREATININE 1.28* 1.31*  CALCIUM 8.8* 8.5*    CMET: Lab Results  Component Value Date   WBC 7.7 07/04/2016   HGB 7.7 (L) 07/04/2016   HCT 24.3 (L) 07/04/2016   PLT 243 07/04/2016   GLUCOSE 104 (H) 07/04/2016   ALT 16 07/04/2016   AST 30 07/04/2016   NA 137 07/04/2016   K 3.4 (L) 07/04/2016   CL 89 (L) 07/04/2016   CREATININE 1.31 (H) 07/04/2016   BUN 49 (H) 07/04/2016   CO2 35 (H) 07/04/2016   INR 1.69 07/04/2016   HGBA1C 5.3 06/22/2016    PT/INR:  Recent Labs  07/04/16 0400  LABPROT 20.0*  INR 1.69     Radiology: Ct Chest Wo Contrast  Result Date: 07/03/2016 CLINICAL DATA:  Shortness of breath and status post cardiac surgery with tricuspid and mitral valve replacement as well as Maze procedure. Chest x-ray demonstrates development of increasing opacification of the left lung and probable development of a loculated left pleural effusion. The patient presents for imaging prior to possible pigtail chest tube placement to drain the left pleural effusion. EXAM: CT CHEST WITHOUT CONTRAST TECHNIQUE: Multidetector CT imaging of the chest was performed following the standard protocol without IV contrast. COMPARISON:  Chest x-ray earlier today. FINDINGS: Cardiovascular: There is a small amount of pericardial fluid. Prosthetic mitral and tricuspid valves present. Left atrial appendage clip also visualized. Mediastinum/Nodes: No evidence of mediastinal hemorrhage or air. No lymphadenopathy identified. Lungs/Pleura: There is dense atelectasis and collapse of the left upper lobe and lingula. There also is significant mucus plug formation in the left lower lobe with atelectasis of the left lower lobe. A small partially loculated pleural effusion is present. There also is a small right pleural effusion. No overt pulmonary edema. No pneumothorax identified. Upper Abdomen: Visualized upper abdominal structures are unremarkable. Musculoskeletal: Evidence of median sternotomy.  No other findings. IMPRESSION: Dense atelectasis and collapse of the left upper lobe and lingula. There also is atelectasis of the left lower lobe with mucous plugging in the lower lobe bronchi. There is only a small partially loculated left pleural effusion and also a small right pleural effusion. Decision was made not to place a percutaneous chest tube today after discussion with Dr. Donata Clay. Electronically Signed   By: Irish Lack M.D.   On: 07/03/2016 16:43   Dg Chest Port 1 View  Result Date: 07/04/2016 CLINICAL DATA:  Shortness of Breath  EXAM: PORTABLE CHEST 1 VIEW COMPARISON:  July 03, 2016 FINDINGS: There is effusion and airspace consolidation causing near complete opacification of the left hemidiaphragm. There is a small right pleural effusion with right base atelectasis. Right lung otherwise is clear. Heart is enlarged but stable. The pulmonary vascularity on the right shows mild pulmonary venous hypertension. Pulmonary vascularity on the left is obscured. There is evidence of all mitral and tricuspid valve replacements. There is a left atrial appendage clamp present. Central catheter tip is in the superior vena cava. No pneumothorax. IMPRESSION: Persistent opacification of essentially all of the left hemi thorax, presumably due to a combination of airspace consolidation and effusion. Small right pleural effusion with mild right base atelectasis. Stable cardiomegaly. Central catheter tip in  superior vena cava. No pneumothorax. The appearance is stable compared to 1 day prior. Electronically Signed   By: William  Woodruff III M.D.Bretta Bang   On: 07/04/2016 09:04   Dg Chest Port 1 View  Result Date: 07/03/2016 CLINICAL DATA:  Shortness of breath. Status post chest PET and suction. EXAM: PORTABLE CHEST 1 VIEW COMPARISON:  Single-view of the chest earlier today. FINDINGS: Near complete whiteout of the left chest consistent with pleural effusion and extensive airspace disease is unchanged. Small right pleural effusion is noted. The patient is status post clipping of the atrial appendage and mitral and tricuspid valve repair. The cardiac silhouette is largely obscured. No pneumothorax. IMPRESSION: No change in near complete whiteout left chest consistent with effusion and airspace disease. Small right pleural effusion is also unchanged. Electronically Signed   By: Drusilla Kannerhomas  Dalessio M.D.   On: 07/03/2016 16:16   Chronic Kidney Disease   Stage I     GFR >90  Stage II    GFR 60-89  Stage IIIA GFR 45-59  Stage IIIB GFR 30-44  Stage IV   GFR  15-29  Stage V    GFR  <15  Lab Results  Component Value Date   CREATININE 1.31 (H) 07/04/2016   Estimated Creatinine Clearance: 42 mL/min (by C-G formula based on SCr of 1.31 mg/dL (H)).  Assessment/Plan: S/P Procedure(s) (LRB): MITRAL VALVE (MV) REPLACEMENT (N/A) TRICUSPID VALVE REPAIR (N/A) MAZE (N/A) TRANSESOPHAGEAL ECHOCARDIOGRAM (TEE) (N/A) Mobilize Diuresis Diabetes control plan bedside bronchoscopy this am Consider positive pressure  Stage III renal failure - stable  Discussed risks and options of bronch with patient, she has been npo since midnight  Delight Ovensdward B Madsen Riddle 07/04/2016 9:15 AM

## 2016-07-04 NOTE — Progress Notes (Signed)
Video bronchoscopy procedure performed. Bronchial washing intervention performed. 

## 2016-07-05 ENCOUNTER — Inpatient Hospital Stay (HOSPITAL_COMMUNITY): Payer: Medicare Other

## 2016-07-05 DIAGNOSIS — J9811 Atelectasis: Secondary | ICD-10-CM | POA: Diagnosis not present

## 2016-07-05 LAB — GLUCOSE, CAPILLARY
GLUCOSE-CAPILLARY: 119 mg/dL — AB (ref 65–99)
GLUCOSE-CAPILLARY: 148 mg/dL — AB (ref 65–99)
Glucose-Capillary: 122 mg/dL — ABNORMAL HIGH (ref 65–99)

## 2016-07-05 LAB — CBC
HCT: 24.3 % — ABNORMAL LOW (ref 36.0–46.0)
Hemoglobin: 7.7 g/dL — ABNORMAL LOW (ref 12.0–15.0)
MCH: 31.4 pg (ref 26.0–34.0)
MCHC: 31.7 g/dL (ref 30.0–36.0)
MCV: 99.2 fL (ref 78.0–100.0)
Platelets: 231 10*3/uL (ref 150–400)
RBC: 2.45 MIL/uL — ABNORMAL LOW (ref 3.87–5.11)
RDW: 15.8 % — ABNORMAL HIGH (ref 11.5–15.5)
WBC: 6.7 10*3/uL (ref 4.0–10.5)

## 2016-07-05 LAB — BASIC METABOLIC PANEL
Anion gap: 9 (ref 5–15)
BUN: 48 mg/dL — ABNORMAL HIGH (ref 6–20)
CO2: 36 mmol/L — ABNORMAL HIGH (ref 22–32)
Calcium: 8.3 mg/dL — ABNORMAL LOW (ref 8.9–10.3)
Chloride: 90 mmol/L — ABNORMAL LOW (ref 101–111)
Creatinine, Ser: 1.22 mg/dL — ABNORMAL HIGH (ref 0.44–1.00)
GFR calc Af Amer: 49 mL/min — ABNORMAL LOW (ref >60)
GFR calc non Af Amer: 42 mL/min — ABNORMAL LOW (ref >60)
Glucose, Bld: 137 mg/dL — ABNORMAL HIGH (ref 65–99)
Potassium: 3.4 mmol/L — ABNORMAL LOW (ref 3.5–5.1)
Sodium: 135 mmol/L (ref 135–145)

## 2016-07-05 LAB — PROTIME-INR
INR: 2.12
PROTHROMBIN TIME: 24.1 s — AB (ref 11.4–15.2)

## 2016-07-05 MED ORDER — ORAL CARE MOUTH RINSE
15.0000 mL | Freq: Two times a day (BID) | OROMUCOSAL | Status: DC
  Administered 2016-07-06 (×2): 15 mL via OROMUCOSAL

## 2016-07-05 MED ORDER — CHLORHEXIDINE GLUCONATE 0.12 % MT SOLN
15.0000 mL | Freq: Two times a day (BID) | OROMUCOSAL | Status: DC
  Administered 2016-07-05 – 2016-07-06 (×4): 15 mL via OROMUCOSAL
  Filled 2016-07-05 (×4): qty 15

## 2016-07-05 MED ORDER — ALBUMIN HUMAN 5 % IV SOLN
12.5000 g | Freq: Once | INTRAVENOUS | Status: AC | PRN
  Administered 2016-07-05: 12.5 g via INTRAVENOUS
  Filled 2016-07-05: qty 250

## 2016-07-05 MED ORDER — POTASSIUM CHLORIDE 10 MEQ/50ML IV SOLN
10.0000 meq | INTRAVENOUS | Status: AC
  Administered 2016-07-05 (×3): 10 meq via INTRAVENOUS
  Filled 2016-07-05 (×3): qty 50

## 2016-07-05 MED ORDER — WARFARIN SODIUM 2.5 MG PO TABS
2.5000 mg | ORAL_TABLET | Freq: Every day | ORAL | Status: DC
  Administered 2016-07-05: 2.5 mg via ORAL
  Filled 2016-07-05: qty 1

## 2016-07-05 NOTE — Progress Notes (Signed)
Patient ID: Sherle PoeGloria Mayson, female   DOB: Dec 29, 1940, 75 y.o.   MRN: [id number] EVENING ROUNDS NOTE :     [address]       [place] [id number]             [phone]                 10 Days Post-Op Procedure(s) (LRB): MITRAL VALVE (MV) REPLACEMENT (N/A) TRICUSPID VALVE REPAIR (N/A) MAZE (N/A) TRANSESOPHAGEAL ECHOCARDIOGRAM (TEE) (N/A)  Total Length of Stay:  LOS: 10 days  BP (!) 97/24   Pulse 66   Temp 97.8 F (36.6 C) (Oral)   Resp 20   Ht 5\' 1"  (1.549 m)   Wt 247 lb 9.2 oz (112.3 kg)   SpO2 98%   BMI 46.78 kg/m   .Intake/Output      10/07 0701 - 10/08 0700 10/08 0701 - 10/09 0700   P.O.  220   I.V. (mL/kg) 382.8 (3.4) 153 (1.4)   IV Piggyback 550 200   Total Intake(mL/kg) 932.8 (8.3) 573 (5.1)   Urine (mL/kg/hr) 2665 (1) 730 (0.6)   Stool 0 (0) 0 (0)   Total Output 2665 730   Net -1732.2 -157        Stool Occurrence 1 x 1 x     . sodium chloride 250 mL (07/05/16 1610)  . furosemide (LASIX) infusion 7 mg/hr (07/05/16 1600)     Lab Results  Component Value Date   WBC 6.7 07/05/2016   HGB 7.7 (L) 07/05/2016   HCT 24.3 (L) 07/05/2016   PLT 231 07/05/2016   GLUCOSE 137 (H) 07/05/2016   ALT 16 07/04/2016   AST 30 07/04/2016   NA 135 07/05/2016   K 3.4 (L) 07/05/2016   CL 90 (L) 07/05/2016   CREATININE 1.22 (H) 07/05/2016   BUN 48 (H) 07/05/2016   CO2 36 (H) 07/05/2016   INR 2.12 07/05/2016   HGBA1C 5.3 06/22/2016   Stable resp status sitting in chair  Declined to have bedside bronch today , pulmonary has seen and also discussed with her At time of bronch not endo bronchial lesions, secretions but no plugs  Keep npo p midnight  Delight OvensEdward B Blen Ransome MD  Beeper [phone] Office [phone] 07/05/2016 6:49 PM

## 2016-07-05 NOTE — Progress Notes (Signed)
Patient ID: Ann Potter, female   DOB: 04-01-1941, 75 y.o.   MRN: [id number] TCTS DAILY ICU PROGRESS NOTE                   [address]            [place] [address]          [phone]   10 Days Post-Op Procedure(s) (LRB): MITRAL VALVE (MV) REPLACEMENT (N/A) TRICUSPID VALVE REPAIR (N/A) MAZE (N/A) TRANSESOPHAGEAL ECHOCARDIOGRAM (TEE) (N/A)  Total Length of Stay:  LOS: 10 days   Subjective: Patient up to chair , alert and awake no acute respiratory distress, left lung collapse not improved on chest xray this am   Objective: Vital signs in last 24 hours: Temp:  [97 F (36.1 C)-98 F (36.7 C)] 97 F (36.1 C) (10/08 0728) Pulse Rate:  [55-73] 61 (10/08 0715) Cardiac Rhythm: Atrial fibrillation (10/08 0430) Resp:  [14-30] 21 (10/08 0715) BP: (80-158)/(40-143) 105/41 (10/08 0715) SpO2:  [89 %-100 %] 100 % (10/08 0715) Weight:  [247 lb 9.2 oz (112.3 kg)] 247 lb 9.2 oz (112.3 kg) (10/08 0500)  Filed Weights   07/03/16 0600 07/04/16 0500 07/05/16 0500  Weight: 251 lb 8.7 oz (114.1 kg) 242 lb 15.2 oz (110.2 kg) 247 lb 9.2 oz (112.3 kg)    Weight change: 4 lb 10.1 oz (2.1 kg)   Hemodynamic parameters for last 24 hours: CVP:  [19 mmHg-20 mmHg] 20 mmHg  Intake/Output from previous day: 10/07 0701 - 10/08 0700 In: 915.8 [I.V.:365.8; IV Piggyback:550] Out: 2665 [Urine:2665]  Intake/Output this shift: No intake/output data recorded.  Current Meds: Scheduled Meds: . acetylcysteine  3 mL Nebulization Q6H  . aspirin EC  81 mg Oral Daily  . bisacodyl  10 mg Oral Daily   Or  . bisacodyl  10 mg Rectal Daily  . cefTAZidime (FORTAZ)  IV  1 g Intravenous Q8H  . docusate sodium  200 mg Oral Daily  . folic acid-pyridoxine-cyancobalamin  1 tablet Oral Daily  . guaiFENesin  1,200 mg Oral BID  . insulin aspart  0-15 Units Subcutaneous TID WC  . insulin aspart  0-5 Units Subcutaneous QHS  . iron polysaccharides  150 mg Oral Daily  . levalbuterol  1.25 mg  Nebulization Q6H  . levothyroxine  50 mcg Oral QAC breakfast  . mouth rinse  15 mL Mouth Rinse BID  . metolazone  5 mg Oral Daily  . montelukast  10 mg Oral Daily  . pantoprazole  40 mg Oral Daily  . potassium chloride  10 mEq Intravenous Q1 Hr x 3  . potassium chloride  40 mEq Oral TID  . rosuvastatin  10 mg Oral Daily  . sodium chloride flush  10-40 mL Intracatheter Q12H  . sodium chloride flush  3 mL Intravenous Q12H  . spironolactone  25 mg Oral Daily  . warfarin  5 mg Oral q1800  . Warfarin - Physician Dosing Inpatient   Does not apply q1800   Continuous Infusions: . sodium chloride 250 mL (07/03/16 0944)  . furosemide (LASIX) infusion 7 mg/hr (07/04/16 1907)   PRN Meds:.acetaminophen, ipratropium-albuterol, lidocaine, ondansetron (ZOFRAN) IV, oxyCODONE, sodium chloride flush, sodium chloride flush, traMADol  General appearance: alert, cooperative and no distress Neurologic: intact Heart: irregularly irregular rhythm Lungs: diminished breath sounds LLL and LUL Abdomen: soft, non-tender; bowel sounds normal; no masses,  no organomegaly Extremities: edema maked lower extremity edema Wound: sternum stable without infection  Lab Results: CBC: Recent Labs  07/04/16 0400 07/05/16 0410  WBC 7.7 6.7  HGB 7.7* 7.7*  HCT 24.3* 24.3*  PLT 243 231   BMET:  Recent Labs  07/04/16 0400 07/05/16 0410  NA 137 135  K 3.4* 3.4*  CL 89* 90*  CO2 35* 36*  GLUCOSE 104* 137*  BUN 49* 48*  CREATININE 1.31* 1.22*  CALCIUM 8.5* 8.3*    CMET: Lab Results  Component Value Date   WBC 6.7 07/05/2016   HGB 7.7 (L) 07/05/2016   HCT 24.3 (L) 07/05/2016   PLT 231 07/05/2016   GLUCOSE 137 (H) 07/05/2016   ALT 16 07/04/2016   AST 30 07/04/2016   NA 135 07/05/2016   K 3.4 (L) 07/05/2016   CL 90 (L) 07/05/2016   CREATININE 1.22 (H) 07/05/2016   BUN 48 (H) 07/05/2016   CO2 36 (H) 07/05/2016   INR 2.12 07/05/2016   HGBA1C 5.3 06/22/2016    PT/INR:  Recent Labs  07/05/16 0410   LABPROT 24.1*  INR 2.12   Radiology: Dg Chest Port 1 View  Result Date: 07/05/2016 CLINICAL DATA:  Recent bronchoscopy. Status post median sternotomy for mitral and tricuspid valve repair. EXAM: PORTABLE CHEST 1 VIEW COMPARISON:  Chest radiograph pre and post bronchoscopy 07/04/2016. FINDINGS: History provided is chest tube placement, however I do not see a procedure note for this in the electronic medical record. Marked worsening aeration, now with complete white out of the LEFT lung. Cardiomegaly. Unchanged central venous catheter. Small RIGHT effusion. Mild edema at the RIGHT lung base. IMPRESSION: Worsening aeration, now with complete white out of the LEFT lung. Electronically Signed   By: Elsie Stain M.D.   On: 07/05/2016 07:32   Dg Chest Port 1 View  Result Date: 07/04/2016 CLINICAL DATA:  Postop check EXAM: PORTABLE CHEST 1 VIEW COMPARISON:  07/04/2016 FINDINGS: Postoperative changes in the mediastinum. Sternotomy wires are present. Cardiac valve prostheses. Persistent diffuse white out of the left lung likely representing a combination of large effusion, infiltration, and atelectasis. Small right pleural effusion with basilar infiltration or atelectasis. No pneumothorax. Right PICC line with tip over the low SVC region. No significant changes since previous study. IMPRESSION: Persistent opacification of the left hemi thorax consistent with pleural effusion, atelectasis, and/or infiltration. Small right pleural effusion with basilar infiltration or atelectasis. Stable appearance since previous study. Electronically Signed   By: Burman Nieves M.D.   On: 07/04/2016 21:09       Recent Results (from the past 240 hour(s))  Culture, respiratory (NON-Expectorated)     Status: None (Preliminary result)   Collection Time: 07/03/16  6:02 PM  Result Value Ref Range Status   Specimen Description TRACHEAL ASPIRATE  Final   Special Requests NONE  Final   Gram Stain   Final    ABUNDANT WBC  PRESENT, PREDOMINANTLY PMN NO ORGANISMS SEEN    Culture CULTURE REINCUBATED FOR BETTER GROWTH  Final   Report Status PENDING  Incomplete      Assessment/Plan: S/P Procedure(s) (LRB): MITRAL VALVE (MV) REPLACEMENT (N/A) TRICUSPID VALVE REPAIR (N/A) MAZE (N/A) TRANSESOPHAGEAL ECHOCARDIOGRAM (TEE) (N/A) On lasix drip , decreased last pm almost 3 l uop past 24 hours Persistent left lung collapse -  no acute respiratory distress, left lung collapse not improved on chest xray this am inspite of inhalers, mucomyst, nt suction, bronch yesterday and positive airway pressure during night - consider repeat bedside bronch vs intubation  And bronch- have asked ccm to see  Cultures from nt suctioning unrevealing currently on  IV antibiotics - fortaz inr 2.1 -  On coumadin 5 mg /day and 81 mg asa  Not on   lovenox - decrease to 2.5 mg /day   Delight Ovensdward B Tom Macpherson 07/05/2016 7:40 AM

## 2016-07-05 NOTE — Plan of Care (Signed)
Problem: Cardiac: Goal: Hemodynamic stability will improve Outcome: Progressing Remains in AFIB  Problem: Respiratory: Goal: Levels of oxygenation will improve Outcome: Progressing Using BIPAP per order. Repeat CXR in am Goal: Respiratory status will improve Outcome: Progressing Awaiting AM CXR  Problem: Urinary Elimination: Goal: Ability to achieve and maintain adequate renal perfusion and functioning will improve Outcome: Progressing UOP WNL on lasix gtt

## 2016-07-05 NOTE — Consult Note (Signed)
Name: Ann Potter MRN: [id number] DOB: October 10, 1940    ADMISSION DATE:  06/18/2016 CONSULTATION DATE:  10/8  REFERRING MD :  Tyrone Sage   CHIEF COMPLAINT:  White-out L hemithorax   BRIEF PATIENT DESCRIPTION: 75yo female with multiple medical problems including AFib, asthma, CAD, CHF, severe mitral stenosis, severe TR admitted 9/28 for MV replacement, tricuspid repair and MAZE.  Post op recovery has been slow and now c/b complete white-out of L hemithorax likely thought r/t mucous plugging.  Initially thought to be pleural effusion and pt was sent to IR for pigtail placement, however CT chest revealed only small amt of effusion and dense atx/collapse.  She was tried on chest PT, mucomyst and FOB by CVTS on 10/7 without significant improvement.  PCCM consulted 10/8 to assist.   SIGNIFICANT EVENTS  9/28>> MV replacement, Tricuspid repair, MAZE  10/7 FOB   STUDIES:  CT chest 10/6>>> Dense atelectasis and collapse of the left upper lobe and lingula. There also is atelectasis of the left lower lobe with mucous plugging in the lower lobe bronchi. There is only a small partially loculated left pleural effusion and also a small right pleural effusion. Decision was made not to place a percutaneous chest tube Today.   HISTORY OF PRESENT ILLNESS:  75yo female with multiple medical problems including AFib, asthma, CAD, CHF, severe mitral stenosis, severe TR admitted 9/28 for MV replacement, tricuspid repair and MAZE.  Post op recovery has been slow and now c/b complete white-out of L hemithorax likely thought r/t mucous plugging.  Initially thought to be pleural effusion and pt was sent to IR for pigtail placement, however CT chest revealed only small amt of effusion and dense atx/collapse.  She was tried on chest PT, mucomyst and FOB by CVTS on 10/7 without significant improvement.  PCCM consulted 10/8 to assist.   Pt currently sitting OOB in chair, no distress.  States breathing feels better than  yesterday.  Denies chest pain, purulent sputum.  She is on lasix gtt.   PAST MEDICAL HISTORY :   has a past medical history of Anxiety; Arthritis; Asthma; Atrial fibrillation (HCC); Atrial fibrillation, chronic (HCC) (01/25/2012); CAD (coronary artery disease); CHF (congestive heart failure) (HCC); Chronic diastolic congestive heart failure (HCC) (01/25/2012); Chronic lower back pain; Chronic renal insufficiency, stage III (moderate) (01/25/2012); Chronic venous insufficiency; COPD (chronic obstructive pulmonary disease) (HCC); Heart murmur; HLD (hyperlipidemia); HTN (hypertension); Hypothyroidism; Lower extremity edema; Macular degeneration; Mitral stenosis; Morbid obesity (HCC); Myocardial infarction (2007); Pulmonary hypertension; Rheumatic fever during childhood; Rheumatic heart disease; S/P Maze operation for atrial fibrillation (06/21/2016); 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  has a past surgical history that includes Multiple tooth extractions (1965); Tubal ligation (1984); TEE with cardioversion; TEE without cardioversion (N/A, 06/25/2015); Cardiac catheterization (12/2011; 04/16/2016); Cataract extraction w/ intraocular lens  implant, bilateral (Bilateral); Cardiac catheterization (N/A, 04/16/2016); Cardioversion; Mitral valve replacement (N/A, 06/24/2016); Tricuspid valve replacement (N/A, 06/20/2016); MAZE (N/A, 06/19/2016); and TEE without cardioversion (N/A, 06/16/2016). Prior to Admission medications   Medication Sig Start Date End Date Taking? Authorizing Provider  acetaminophen (TYLENOL) 650 MG CR tablet Take 650 mg by mouth every 8 (eight) hours as needed for pain.   Yes Historical Provider, MD  albuterol (PROVENTIL HFA;VENTOLIN HFA) 108 (90 BASE) MCG/ACT inhaler Inhale 2 puffs into the lungs every 6 (six) hours as needed. For shortness of breath   Yes Historical Provider, MD  diltiazem  (CARDIZEM) 30 MG tablet Take 1  tablet (30 mg total) by mouth 2 (two) times daily. 07/03/15  Yes Antoine Poche, MD  Fluticasone-Salmeterol (ADVAIR) 250-50 MCG/DOSE AEPB Inhale 1 puff into the lungs 2 (two) times daily.   Yes Historical Provider, MD  levothyroxine (SYNTHROID, LEVOTHROID) 50 MCG tablet Take 50 mcg by mouth daily.   Yes Historical Provider, MD  loratadine (CLARITIN) 10 MG tablet Take 10 mg by mouth as needed for allergies.    Yes Historical Provider, MD  metolazone (ZAROXOLYN) 2.5 MG tablet Take 1 tab every other week;may take weekly for additional swelling as needed. Patient taking differently: Take 2.5 mg by mouth See admin instructions. Take 2.5 mg by mouth every other week;may take weekly for additional swelling as needed. 05/21/16  Yes Antoine Poche, MD  montelukast (SINGULAIR) 10 MG tablet Take 10 mg by mouth daily.    Yes Historical Provider, MD  potassium chloride SA (K-DUR,KLOR-CON) 20 MEQ tablet Take 2 tablets (40 mEq total) by mouth 2 (two) times daily. 04/17/16  Yes Arty Baumgartner, NP  rivaroxaban (XARELTO) 20 MG TABS tablet Take 1 tablet (20 mg total) by mouth daily with supper. 10/01/15  Yes Antoine Poche, MD  rosuvastatin (CRESTOR) 10 MG tablet Take 10 mg by mouth daily.   Yes Historical Provider, MD  torsemide (DEMADEX) 20 MG tablet Take 4 tablets (80 mg total) by mouth 2 (two) times daily. 04/08/16  Yes Antoine Poche, MD   Allergies  Allergen Reactions  . Relafen [Nabumetone] Other (See Comments)    Bladder pain    FAMILY HISTORY:  family history includes Cancer in her mother and sister; Deep vein thrombosis in her father; Heart disease (age of onset: 68) in her father; Stomach cancer in her brother. SOCIAL HISTORY:  reports that she has quit smoking. Her smoking use included Cigarettes. She started smoking about 56 years ago. She has a 11.00 pack-year smoking history. She has never used smokeless tobacco. She reports that she does not drink alcohol or  use drugs.  REVIEW OF SYSTEMS:   As per HPI - All other systems reviewed and were neg.    SUBJECTIVE:   VITAL SIGNS: Temp:  [97 F (36.1 C)-98 F (36.7 C)] 97 F (36.1 C) (10/08 0728) Pulse Rate:  [55-73] 59 (10/08 1000) Resp:  [14-30] 21 (10/08 1000) BP: (80-158)/(39-143) 97/39 (10/08 1000) SpO2:  [89 %-100 %] 98 % (10/08 1000) Weight:  [112.3 kg (247 lb 9.2 oz)] 112.3 kg (247 lb 9.2 oz) (10/08 0500)  PHYSICAL EXAMINATION: General:  Obese female, NAD sitting OOB in chair  Neuro:  Awake, alert, appropriate, MAE, gen weakness  HEENT:  Mm moist, no JVD  Cardiovascular:  s1s2 rrr Lungs:  resps even non labored on [place], diminished L, few scattered rhonchi R  Abdomen:  Round, soft, non tender  Musculoskeletal:  Warm and dry, 2-3+ BLE edema    Recent Labs Lab 07/03/16 0400 07/04/16 0400 07/05/16 0410  NA 135 137 135  K 4.2 3.4* 3.4*  CL 88* 89* 90*  CO2 33* 35* 36*  BUN 48* 49* 48*  CREATININE 1.28* 1.31* 1.22*  GLUCOSE 104* 104* 137*    Recent Labs Lab 07/03/16 0400 07/04/16 0400 07/05/16 0410  HGB 8.1* 7.7* 7.7*  HCT 25.9* 24.3* 24.3*  WBC 7.9 7.7 6.7  PLT 242 243 231   Ct Chest Wo Contrast  Result Date: 07/03/2016 CLINICAL DATA:  Shortness of breath and status post cardiac surgery with tricuspid and mitral valve replacement as well as  Maze procedure. Chest x-ray demonstrates development of increasing opacification of the left lung and probable development of a loculated left pleural effusion. The patient presents for imaging prior to possible pigtail chest tube placement to drain the left pleural effusion. EXAM: CT CHEST WITHOUT CONTRAST TECHNIQUE: Multidetector CT imaging of the chest was performed following the standard protocol without IV contrast. COMPARISON:  Chest x-ray earlier today. FINDINGS: Cardiovascular: There is a small amount of pericardial fluid. Prosthetic mitral and tricuspid valves present. Left atrial appendage clip also visualized.  Mediastinum/Nodes: No evidence of mediastinal hemorrhage or air. No lymphadenopathy identified. Lungs/Pleura: There is dense atelectasis and collapse of the left upper lobe and lingula. There also is significant mucus plug formation in the left lower lobe with atelectasis of the left lower lobe. A small partially loculated pleural effusion is present. There also is a small right pleural effusion. No overt pulmonary edema. No pneumothorax identified. Upper Abdomen: Visualized upper abdominal structures are unremarkable. Musculoskeletal: Evidence of median sternotomy.  No other findings. IMPRESSION: Dense atelectasis and collapse of the left upper lobe and lingula. There also is atelectasis of the left lower lobe with mucous plugging in the lower lobe bronchi. There is only a small partially loculated left pleural effusion and also a small right pleural effusion. Decision was made not to place a percutaneous chest tube today after discussion with Dr. Donata Clay. Electronically Signed   By: Irish Lack M.D.   On: 07/03/2016 16:43   Dg Chest Port 1 View  Result Date: 07/05/2016 CLINICAL DATA:  Recent bronchoscopy. Status post median sternotomy for mitral and tricuspid valve repair. EXAM: PORTABLE CHEST 1 VIEW COMPARISON:  Chest radiograph pre and post bronchoscopy 07/04/2016. FINDINGS: History provided is chest tube placement, however I do not see a procedure note for this in the electronic medical record. Marked worsening aeration, now with complete white out of the LEFT lung. Cardiomegaly. Unchanged central venous catheter. Small RIGHT effusion. Mild edema at the RIGHT lung base. IMPRESSION: Worsening aeration, now with complete white out of the LEFT lung. Electronically Signed   By: Elsie Stain M.D.   On: 07/05/2016 07:32   Dg Chest Port 1 View  Result Date: 07/04/2016 CLINICAL DATA:  Postop check EXAM: PORTABLE CHEST 1 VIEW COMPARISON:  07/04/2016 FINDINGS: Postoperative changes in the mediastinum.  Sternotomy wires are present. Cardiac valve prostheses. Persistent diffuse white out of the left lung likely representing a combination of large effusion, infiltration, and atelectasis. Small right pleural effusion with basilar infiltration or atelectasis. No pneumothorax. Right PICC line with tip over the low SVC region. No significant changes since previous study. IMPRESSION: Persistent opacification of the left hemi thorax consistent with pleural effusion, atelectasis, and/or infiltration. Small right pleural effusion with basilar infiltration or atelectasis. Stable appearance since previous study. Electronically Signed   By: Burman Nieves M.D.   On: 07/04/2016 21:09   Dg Chest Port 1 View  Result Date: 07/04/2016 CLINICAL DATA:  Shortness of Breath EXAM: PORTABLE CHEST 1 VIEW COMPARISON:  July 03, 2016 FINDINGS: There is effusion and airspace consolidation causing near complete opacification of the left hemidiaphragm. There is a small right pleural effusion with right base atelectasis. Right lung otherwise is clear. Heart is enlarged but stable. The pulmonary vascularity on the right shows mild pulmonary venous hypertension. Pulmonary vascularity on the left is obscured. There is evidence of all mitral and tricuspid valve replacements. There is a left atrial appendage clamp present. Central catheter tip is in the superior vena cava.  No pneumothorax. IMPRESSION: Persistent opacification of essentially all of the left hemi thorax, presumably due to a combination of airspace consolidation and effusion. Small right pleural effusion with mild right base atelectasis. Stable cardiomegaly. Central catheter tip in superior vena cava. No pneumothorax. The appearance is stable compared to 1 day prior. Electronically Signed   By: Bretta BangWilliam  Woodruff III M.D.   On: 07/04/2016 09:04   Dg Chest Port 1 View  Result Date: 07/03/2016 CLINICAL DATA:  Shortness of breath. Status post chest PET and suction. EXAM: PORTABLE  CHEST 1 VIEW COMPARISON:  Single-view of the chest earlier today. FINDINGS: Near complete whiteout of the left chest consistent with pleural effusion and extensive airspace disease is unchanged. Small right pleural effusion is noted. The patient is status post clipping of the atrial appendage and mitral and tricuspid valve repair. The cardiac silhouette is largely obscured. No pneumothorax. IMPRESSION: No change in near complete whiteout left chest consistent with effusion and airspace disease. Small right pleural effusion is also unchanged. Electronically Signed   By: Drusilla Kannerhomas  Dalessio M.D.   On: 07/03/2016 16:16    ASSESSMENT / PLAN:  Atelectasis/collapse with complete opacification L hemithorax. No significant improvement with chest PT, mucomyst, FOB 10/7.  Small L pleural effusion   REC -  Continue aggressive pulmonary hygiene - chest vest, mucomyst, flutter valve Mobilize  F/u CXR  Diuresis per CVTS  Hold off on further FOB for today as pt comfortable, no distress, no significant improvement with FOB yesterday  Will d/w Dr. Delton CoombesByrum.    Dirk DressKaty Whiteheart, NP 07/05/2016  10:19 AM Pager: [phone] or [phone]  Attending Note:  I have examined patient, reviewed labs, studies and notes. I have discussed the case with Jasper RilingK Whiteheart, and I agree with the data and plans as amended above. Pleasant 75 yo woman s/p MVR, tricuspid repair, MAZE whose post-op course has been complicated by recurrent LLL collapse and mucous plugging. She underwent FOB on 10/6 with minimal change in CXR, slight improvement. Her CXR from this am 10/8 shows almost complete atelectasis of the L lung despite pulm hygiene efforts. She doesn't want to have another FOB, although she may ultimately need it. I explained to her that she needs to be aggressive w IS, mobility, flutter, mucomyst nebs if we are going to be able to avoid. No report of an endobronchial lesion on FOB from 10/6. We will follow her 10/9, help with FOB  if she is no different.   Levy Pupaobert Alayja Armas, MD, PhD 07/05/2016, 5:27 PM [facility] [phone] or if no answer [phone]

## 2016-07-06 ENCOUNTER — Inpatient Hospital Stay (HOSPITAL_COMMUNITY): Payer: Medicare Other

## 2016-07-06 DIAGNOSIS — Z953 Presence of xenogenic heart valve: Secondary | ICD-10-CM

## 2016-07-06 DIAGNOSIS — J9610 Chronic respiratory failure, unspecified whether with hypoxia or hypercapnia: Secondary | ICD-10-CM | POA: Diagnosis present

## 2016-07-06 DIAGNOSIS — J189 Pneumonia, unspecified organism: Secondary | ICD-10-CM | POA: Diagnosis not present

## 2016-07-06 DIAGNOSIS — J9620 Acute and chronic respiratory failure, unspecified whether with hypoxia or hypercapnia: Secondary | ICD-10-CM | POA: Diagnosis not present

## 2016-07-06 LAB — CBC
HEMATOCRIT: 25.8 % — AB (ref 36.0–46.0)
HEMOGLOBIN: 8 g/dL — AB (ref 12.0–15.0)
MCH: 31 pg (ref 26.0–34.0)
MCHC: 31 g/dL (ref 30.0–36.0)
MCV: 100 fL (ref 78.0–100.0)
PLATELETS: 271 10*3/uL (ref 150–400)
RBC: 2.58 MIL/uL — AB (ref 3.87–5.11)
RDW: 16.3 % — ABNORMAL HIGH (ref 11.5–15.5)
WBC: 6.8 10*3/uL (ref 4.0–10.5)

## 2016-07-06 LAB — BODY FLUID CELL COUNT WITH DIFFERENTIAL
EOS FL: 6 %
Lymphs, Fluid: 31 %
MONOCYTE-MACROPHAGE-SEROUS FLUID: 1 % — AB (ref 50–90)
NEUTROPHIL FLUID: 62 % — AB (ref 0–25)
WBC FLUID: 267 uL (ref 0–1000)

## 2016-07-06 LAB — GLUCOSE, CAPILLARY
Glucose-Capillary: 108 mg/dL — ABNORMAL HIGH (ref 65–99)
Glucose-Capillary: 127 mg/dL — ABNORMAL HIGH (ref 65–99)
Glucose-Capillary: 116 mg/dL — ABNORMAL HIGH (ref 65–99)
Glucose-Capillary: 160 mg/dL — ABNORMAL HIGH (ref 65–99)

## 2016-07-06 LAB — PROTIME-INR
INR: 2.05
PROTHROMBIN TIME: 23.5 s — AB (ref 11.4–15.2)

## 2016-07-06 LAB — COMPREHENSIVE METABOLIC PANEL
ALK PHOS: 96 U/L (ref 38–126)
ALT: 16 U/L (ref 14–54)
AST: 27 U/L (ref 15–41)
Albumin: 2.3 g/dL — ABNORMAL LOW (ref 3.5–5.0)
Anion gap: 11 (ref 5–15)
BILIRUBIN TOTAL: 0.9 mg/dL (ref 0.3–1.2)
BUN: 48 mg/dL — AB (ref 6–20)
CALCIUM: 8.3 mg/dL — AB (ref 8.9–10.3)
CHLORIDE: 90 mmol/L — AB (ref 101–111)
CO2: 37 mmol/L — ABNORMAL HIGH (ref 22–32)
CREATININE: 1.29 mg/dL — AB (ref 0.44–1.00)
GFR, EST AFRICAN AMERICAN: 45 mL/min — AB (ref >60)
GFR, EST NON AFRICAN AMERICAN: 39 mL/min — AB (ref >60)
Glucose, Bld: 119 mg/dL — ABNORMAL HIGH (ref 65–99)
Potassium: 3.3 mmol/L — ABNORMAL LOW (ref 3.5–5.1)
Sodium: 138 mmol/L (ref 135–145)
Total Protein: 5.5 g/dL — ABNORMAL LOW (ref 6.5–8.1)

## 2016-07-06 LAB — CULTURE, RESPIRATORY W GRAM STAIN: Culture: NORMAL

## 2016-07-06 LAB — PREPARE RBC (CROSSMATCH)

## 2016-07-06 MED ORDER — FENTANYL CITRATE (PF) 100 MCG/2ML IJ SOLN
INTRAMUSCULAR | Status: AC
  Administered 2016-07-06: 100 ug
  Filled 2016-07-06: qty 2

## 2016-07-06 MED ORDER — MIDAZOLAM HCL 2 MG/2ML IJ SOLN
INTRAMUSCULAR | Status: AC
  Administered 2016-07-06: 4 mg
  Filled 2016-07-06: qty 4

## 2016-07-06 MED ORDER — SODIUM CHLORIDE 0.9 % IV SOLN
Freq: Once | INTRAVENOUS | Status: AC
  Administered 2016-07-06: 11:00:00 via INTRAVENOUS

## 2016-07-06 MED ORDER — VANCOMYCIN HCL 10 G IV SOLR
1250.0000 mg | INTRAVENOUS | Status: DC
  Administered 2016-07-06 – 2016-07-09 (×4): 1250 mg via INTRAVENOUS
  Filled 2016-07-06 (×6): qty 1250

## 2016-07-06 MED ORDER — SPIRONOLACTONE 25 MG PO TABS
50.0000 mg | ORAL_TABLET | Freq: Every day | ORAL | Status: DC
  Administered 2016-07-06 – 2016-07-07 (×2): 50 mg via ORAL
  Filled 2016-07-06 (×2): qty 2

## 2016-07-06 NOTE — Progress Notes (Signed)
Pharmacy Antibiotic Note  Ann Potter is a 75 y.o. female admitted on 06/10/2016 for mitral valve replacement, trcuspid valve repair and Maze procedure .    Day # 4 Ceftazidime for pneumonia.  Adding Vancomycin today.  Afebrile, WBC 6.8. 10/7 tracheal aspirate culture with normal flora.  Left hemithorax and small right effusion noted. Diuresing.  s/p bronchoscopy 10/6, plan repeat bronch.    Plan: Vancomycin 1250 mg  IV every 24 hours.  Goal trough 15-20 mcg/mL.  Continues Ceftazidime 1 gm IV q8hrs. Follow renal function, any new culture data, progress. Will consider Vanc trough level at steady-state.  Height: 5\' 1"  (154.9 cm) Weight: 246 lb 11.1 oz (111.9 kg) IBW/kg (Calculated) : 47.8  Temp (24hrs), Avg:97.9 F (36.6 C), Min:97.7 F (36.5 C), Max:98.4 F (36.9 C)   Recent Labs Lab 07/02/16 0335 07/02/16 1749 07/03/16 0400 07/04/16 0400 07/05/16 0410 07/06/16 0553  WBC 8.0  --  7.9 7.7 6.7 6.8  CREATININE 1.22* 1.60* 1.28* 1.31* 1.22* 1.29*    Estimated Creatinine Clearance: 43 mL/min (by C-G formula based on SCr of 1.29 mg/dL (H)).    Allergies  Allergen Reactions  . Relafen [Nabumetone] Other (See Comments)    Bladder pain    Antimicrobials this admission: Cefuroxime 9/28>>9/30 Ceftazidime 10/6 >> Vanc 9/28 x 2 (OR day). Resume 10/9>>  Dose adjustments this admission:  n/a  Microbiology results:  10/6 tracheal aspirate - normal respiratory flora   Thank you for allowing pharmacy to be a part of this patient's care.  Dennie Fettersgan, Kymorah Korf [place], [place] Pager: [phone] 07/06/2016 9:53 AM

## 2016-07-06 NOTE — Procedures (Signed)
Bedside Bronchoscopy Procedure Note Shakya Sebring [id number] [birth year]  Procedure: Bronchoscopy Indications: Diagnostic evaluation of the airways and Obtain specimens for culture and/or other diagnostic studies  Procedure Details: Bite block in place: Yes In preparation for procedure, Patient hyper-oxygenated with 4l [place] Airway entered and the following bronchi were examined: LUL.   Patient remained on 4l [place]  Evaluation BP (!) 130/47   Pulse (!) 57   Temp 98.4 F (36.9 C) (Oral)   Resp 19   Ht  (1.549 m)   Wt 246 lb 11.1 oz (111.9 kg)   SpO2 97%   BMI 46.61 kg/m  Breath Sounds:Diminished O2 sats: stable throughout and transiently fell during during procedure Patient's Current Condition: stable Specimens: BAL sent to the lab Complications: No apparent complications Patient did tolerate procedure well.   Prior to procedure start, Patient's airway was numb.  1023 - Pre-procedural verification performed, verified allergies. 1025 - 0.25% neosynephrine nasal spray given to both nares 1026 - 2% lidocaine jelly instilled into nares 1028 - 1% lidocaine nebulizer given   Time out performed at 1054.  The bronchoscope entered the airway at 1056, and was removed at 1108.    Lysbeth Penner [facility] 07/06/2016, 11:20 AM

## 2016-07-06 NOTE — Procedures (Signed)
PCCM Video Bronchoscopy Procedure Note  The patient was informed of the risks (including but not limited to bleeding, infection, respiratory failure, lung injury, tooth/oral injury) and benefits of the procedure and gave consent, see chart.  Indication: Left lung collapse  Post Procedure Diagnosis: Lt lung collapse  Location: Lt lung  Condition pre procedure: Stable  Medications for procedure: 2 mg versed, 25 mcg fentanyl  Procedure description: The bronchoscope was introduced through the mouth and passed to the bilateral lungs to the level of the subsegmental bronchi throughout the tracheobronchial tree.  Mucus plugs, tenacious seceretions noted Lt >Rt. collapsible airways. Friable mucosa, bleeds easily. Bleeding noted on lt side which appears to be coming from LUL. Saline instilled and lavaged   Procedures performed: Lavage LUL  Specimens sent: Cultures  Condition post procedure: Stable  EBL: 5 cc  Complications: none.  Chilton GreathousePraveen Martika Egler MD [facility] Pager [phone] If no answer or after 3pm call: [phone] 07/06/2016, 11:20 AM

## 2016-07-06 NOTE — Progress Notes (Signed)
1200 CPT not done per Dr. Shirlee MoreMannam's request following the bedside bronch.

## 2016-07-06 NOTE — Progress Notes (Signed)
[facility] BRIEF SICU PROGRESS NOTE  11 Days Post-Op  S/P Procedure(s) (LRB): MITRAL VALVE (MV) REPLACEMENT (N/A) TRICUSPID VALVE REPAIR (N/A) MAZE (N/A) TRANSESOPHAGEAL ECHOCARDIOGRAM (TEE) (N/A)   Results of bronch noted No clinical improvement, and if anything she looks more dyspneic this evening  Plan: If patient deteriorates any further and/or if no expansion in left lung noted by tomorrow morning I think she will need reintubation for general anesthesia to allow for adequate pulmonary toilet via repeat bronchoscopy.  Will hold Coumadin for now and let INR drift down to decrease amount of bleeding associated with repeat bronchoscopy.  Keep NPO after midnight tonight.  Prognosis guarded at present.  Purcell Nailslarence H Owen, MD 07/06/2016 6:39 PM

## 2016-07-06 NOTE — Progress Notes (Signed)
Pt tolerated Bronch well. Currently resting in the bed, vital signs stable.

## 2016-07-06 NOTE — Progress Notes (Addendum)
[address]       [name] [address]             [phone]        CARDIOTHORACIC SURGERY PROGRESS NOTE   R11 Days Post-Op Procedure(s) (LRB): MITRAL VALVE (MV) REPLACEMENT (N/A) TRICUSPID VALVE REPAIR (N/A) MAZE (N/A) TRANSESOPHAGEAL ECHOCARDIOGRAM (TEE) (N/A)  Subjective: No complaints except chronic pain in back.  Denies SOB.  Reports + productive cough  Objective: Vital signs: BP Readings from Last 1 Encounters:  07/06/16 (!) 116/40   Pulse Readings from Last 1 Encounters:  07/06/16 62   Resp Readings from Last 1 Encounters:  07/06/16 19   Temp Readings from Last 1 Encounters:  07/06/16 97.8 F (36.6 C) (Axillary)    Hemodynamics:    Physical Exam:  Rhythm:   Junctional 60-70  Breath sounds: Scattered rhonchi, decreased BS's on left  Heart sounds:  RRR  Incisions:  Clean and dry  Abdomen:  Soft, non-distended, non-tender  Extremities:  Warm, well-perfused, swollen   Intake/Output from previous day: 10/08 0701 - 10/09 0700 In: 894 [P.O.:220; I.V.:374; IV Piggyback:300] Out: 1830 [Urine:1830] Intake/Output this shift: No intake/output data recorded.  Lab Results:  CBC: Recent Labs  07/05/16 0410 07/06/16 0553  WBC 6.7 6.8  HGB 7.7* 8.0*  HCT 24.3* 25.8*  PLT 231 271    BMET:  Recent Labs  07/05/16 0410 07/06/16 0553  NA 135 138  K 3.4* 3.3*  CL 90* 90*  CO2 36* 37*  GLUCOSE 137* 119*  BUN 48* 48*  CREATININE 1.22* 1.29*  CALCIUM 8.3* 8.3*     PT/INR:   Recent Labs  07/06/16 0553  LABPROT 23.5*  INR 2.05    CBG (last 3)   Recent Labs  07/05/16 1107 07/05/16 1723 07/05/16 2107  GLUCAP 122* 119* 148*    ABG    Component Value Date/Time   PHART 7.337 (L) 06/26/2016 0859   PCO2ART 43.2 06/26/2016 0859   PO2ART 90.0 06/26/2016 0859   HCO3 23.2 06/26/2016 0859   TCO2 34 07/02/2016 1749   ACIDBASEDEF 3.0 (H) 06/26/2016 0859   O2SAT 72.3 07/01/2016 0325    CXR: PORTABLE CHEST 1  VIEW  COMPARISON:  07/05/2016  FINDINGS: Persistent opacification of left hemi thorax is noted. Postsurgical changes are again seen. A right-sided PICC line is noted again in satisfactory position. Right pleural effusion remains. Mild central vascular congestion is noted on the right. No acute bony abnormality is seen.  IMPRESSION: Persistent opacification of the left hemi thorax.  Small right effusion is noted.   Electronically Signed   By: Alcide Clever M.D.   On: 07/06/2016 07:43  Assessment/Plan: S/P Procedure(s) (LRB): MITRAL VALVE (MV) REPLACEMENT (N/A) TRICUSPID VALVE REPAIR (N/A) MAZE (N/A) TRANSESOPHAGEAL ECHOCARDIOGRAM (TEE) (N/A)  Overall remains stable now POD11 However, 100% collapse of left lung persists c/w atelectasis due to secretions and poor pulmonary toilet HCAP - no culture results yet available from FOB performed 10/7 - day #2 [place] Maintaining junctional rhythm w/ stable HR and BP Permanent atrial fibrillation now s/p maze procedure - HR stable and well controlled Acute on chronic diastolic CHF with expected post-op volume excess, weight still 7lbs above preop baseline - I/O negative 0.9 liters yesterday Hypokalemia - diuretic induced Expected post op acute blood loss anemia, Hgb 8.0 this morning Longstanding rheumatic heart disease  Pulmonary hypertension Chronic kidney disease stage III, creatinine stable/improved - diuresing well Expected post op acute blood loss anemia, Hgb stable  Expected post op atelectasis, mild L>R Morbid obesity Physical deconditioning - severe Protein-depleted malnutrition Chronic venous insufficiency   Needs repeat bronch for pulmonary toilet  Will add Vancomycin for HCAP  Continue nebs, incentive spirometry, chest PT  Needs increased mobilization - d/c foley  Transfuse PRBC's to get Hgb up to 10 for improved oxygen carrying capacity - 1 unit today  Continue diuresis  Continue aggressive  PT  Purcell Nailslarence H Owen, MD 07/06/2016 8:10 AM

## 2016-07-06 NOTE — Progress Notes (Signed)
Video bronchoscopy performed.  Intervention bronchial washing.  No complications noted. 

## 2016-07-06 NOTE — Consult Note (Addendum)
Name: Ann Potter MRN: [id number] DOB: Apr 18, 1941    ADMISSION DATE:  Jul 23, 2016 CONSULTATION DATE:  10/8  REFERRING MD :  Tyrone Sage   CHIEF COMPLAINT:  White-out L hemithorax   BRIEF PATIENT DESCRIPTION: 75yo female with multiple medical problems including AFib, asthma, CAD, CHF, severe mitral stenosis, severe TR admitted 9/28 for MV replacement, tricuspid repair and MAZE.  Post op recovery has been slow and now c/b complete white-out of L hemithorax likely thought r/t mucous plugging.  Initially thought to be pleural effusion and pt was sent to IR for pigtail placement, however CT chest revealed only small amt of effusion and dense atx/collapse.  She was tried on chest PT, mucomyst and FOB by CVTS on 10/7 without significant improvement.  PCCM consulted 10/8 to assist.   SIGNIFICANT EVENTS  9/28>> MV replacement, Tricuspid repair, MAZE  10/7 FOB   STUDIES:  CT chest 10/6>>> Dense atelectasis and collapse of the left upper lobe and lingula. There also is atelectasis of the left lower lobe with mucous plugging in the lower lobe bronchi. There is only a small partially loculated left pleural effusion and also a small right pleural effusion. Decision was made not to place a percutaneous chest tube CXR 10/9 > persistent collapse of lt lung   HISTORY OF PRESENT ILLNESS:  75yo female with multiple medical problems including AFib, asthma, CAD, CHF, severe mitral stenosis, severe TR admitted 9/28 for MV replacement, tricuspid repair and MAZE.  Post op recovery has been slow and now c/b complete white-out of L hemithorax likely thought r/t mucous plugging.  Initially thought to be pleural effusion and pt was sent to IR for pigtail placement, however CT chest revealed only small amt of effusion and dense atx/collapse.  She was tried on chest PT, mucomyst and FOB by CVTS on 10/7 without significant improvement.  PCCM consulted 10/8 to assist.   PAST MEDICAL HISTORY :   has a past medical  history of Anxiety; Arthritis; Asthma; Atrial fibrillation (HCC); Atrial fibrillation, chronic (HCC) (01/25/2012); CAD (coronary artery disease); CHF (congestive heart failure) (HCC); Chronic diastolic congestive heart failure (HCC) (01/25/2012); Chronic lower back pain; Chronic renal insufficiency, stage III (moderate) (01/25/2012); Chronic venous insufficiency; COPD (chronic obstructive pulmonary disease) (HCC); Heart murmur; HLD (hyperlipidemia); HTN (hypertension); Hypothyroidism; Lower extremity edema; Macular degeneration; Mitral stenosis; Morbid obesity (HCC); Myocardial infarction (2007); Pulmonary hypertension; Rheumatic fever during childhood; Rheumatic heart disease; S/P Maze operation for atrial fibrillation (July 23, 2016); 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  has a past surgical history that includes Multiple tooth extractions (1965); Tubal ligation (1984); TEE with cardioversion; TEE without cardioversion (N/A, 06/25/2015); Cardiac catheterization (12/2011; 04/16/2016); Cataract extraction w/ intraocular lens  implant, bilateral (Bilateral); Cardiac catheterization (N/A, 04/16/2016); Cardioversion; Mitral valve replacement (N/A, 06/30/2016); Tricuspid valve replacement (N/A, 07/11/2016); MAZE (N/A, 23-Jul-2016); and TEE without cardioversion (N/A, 06/29/2016). Prior to Admission medications   Medication Sig Start Date End Date Taking? Authorizing Provider  acetaminophen (TYLENOL) 650 MG CR tablet Take 650 mg by mouth every 8 (eight) hours as needed for pain.   Yes Historical Provider, MD  albuterol (PROVENTIL HFA;VENTOLIN HFA) 108 (90 BASE) MCG/ACT inhaler Inhale 2 puffs into the lungs every 6 (six) hours as needed. For shortness of breath   Yes Historical Provider, MD  diltiazem (CARDIZEM) 30 MG tablet Take 1 tablet (30 mg total) by mouth 2 (two) times daily. 07/03/15  Yes Antoine Poche, MD    Fluticasone-Salmeterol (ADVAIR) [id number]  MCG/DOSE AEPB Inhale 1 puff into the lungs 2 (two) times daily.   Yes Historical Provider, MD  levothyroxine (SYNTHROID, LEVOTHROID) 50 MCG tablet Take 50 mcg by mouth daily.   Yes Historical Provider, MD  loratadine (CLARITIN) 10 MG tablet Take 10 mg by mouth as needed for allergies.    Yes Historical Provider, MD  metolazone (ZAROXOLYN) 2.5 MG tablet Take 1 tab every other week;may take weekly for additional swelling as needed. Patient taking differently: Take 2.5 mg by mouth See admin instructions. Take 2.5 mg by mouth every other week;may take weekly for additional swelling as needed. 05/21/16  Yes Antoine PocheJonathan F Branch, MD  montelukast (SINGULAIR) 10 MG tablet Take 10 mg by mouth daily.    Yes Historical Provider, MD  potassium chloride SA (K-DUR,KLOR-CON) 20 MEQ tablet Take 2 tablets (40 mEq total) by mouth 2 (two) times daily. 04/17/16  Yes Arty BaumgartnerLindsay B Roberts, NP  rivaroxaban (XARELTO) 20 MG TABS tablet Take 1 tablet (20 mg total) by mouth daily with supper. 10/01/15  Yes Antoine PocheJonathan F Branch, MD  rosuvastatin (CRESTOR) 10 MG tablet Take 10 mg by mouth daily.   Yes Historical Provider, MD  torsemide (DEMADEX) 20 MG tablet Take 4 tablets (80 mg total) by mouth 2 (two) times daily. 04/08/16  Yes Antoine PocheJonathan F Branch, MD   Allergies  Allergen Reactions  . Relafen [Nabumetone] Other (See Comments)    Bladder pain    FAMILY HISTORY:  family history includes Cancer in her mother and sister; Deep vein thrombosis in her father; Heart disease (age of onset: 4030) in her father; Stomach cancer in her brother. SOCIAL HISTORY:  reports that she has quit smoking. Her smoking use included Cigarettes. She started smoking about 56 years ago. She has a 11.00 pack-year smoking history. She has never used smokeless tobacco. She reports that she does not drink alcohol or use drugs.  REVIEW OF SYSTEMS:   As per HPI - All other systems reviewed and were neg.    SUBJECTIVE:   VITAL  SIGNS: Temp:  [97.7 F (36.5 C)-97.9 F (36.6 C)] 97.8 F (36.6 C) (10/09 0327) Pulse Rate:  [56-66] 62 (10/09 0719) Resp:  [16-29] 19 (10/09 0719) BP: (80-116)/(24-94) 116/40 (10/09 0719) SpO2:  [89 %-100 %] 99 % (10/09 0719) Weight:  [246 lb 11.1 oz (111.9 kg)] 246 lb 11.1 oz (111.9 kg) (10/09 0400)  PHYSICAL EXAMINATION: General:  Obese female, No distress Neuro:  Awake, alert, appropriate, MAE, gen weakness  HEENT:  Mm moist, no JVD  Cardiovascular:  S1s2, RRR, No MRG Lungs:  Scattered crackles, Diminished breath sounds on left.  Abdomen:  Round, soft, non tender  Musculoskeletal:  Warm and dry, 2-3+ BLE edema    Recent Labs Lab 07/04/16 0400 07/05/16 0410 07/06/16 0553  NA 137 135 138  K 3.4* 3.4* 3.3*  CL 89* 90* 90*  CO2 35* 36* 37*  BUN 49* 48* 48*  CREATININE 1.31* 1.22* 1.29*  GLUCOSE 104* 137* 119*    Recent Labs Lab 07/04/16 0400 07/05/16 0410 07/06/16 0553  HGB 7.7* 7.7* 8.0*  HCT 24.3* 24.3* 25.8*  WBC 7.7 6.7 6.8  PLT 243 231 271   Dg Chest Port 1 View  Result Date: 07/06/2016 CLINICAL DATA:  Chest pain EXAM: PORTABLE CHEST 1 VIEW COMPARISON:  07/05/2016 FINDINGS: Persistent opacification of left hemi thorax is noted. Postsurgical changes are again seen. A right-sided PICC line is noted again in satisfactory position. Right pleural effusion remains. Mild central vascular congestion is noted  on the right. No acute bony abnormality is seen. IMPRESSION: Persistent opacification of the left hemi thorax. Small right effusion is noted. Electronically Signed   By: Alcide Clever M.D.   On: 07/06/2016 07:43   Dg Chest Port 1 View  Result Date: 07/05/2016 CLINICAL DATA:  Recent bronchoscopy. Status post median sternotomy for mitral and tricuspid valve repair. EXAM: PORTABLE CHEST 1 VIEW COMPARISON:  Chest radiograph pre and post bronchoscopy 07/04/2016. FINDINGS: History provided is chest tube placement, however I do not see a procedure note for this in the  electronic medical record. Marked worsening aeration, now with complete white out of the LEFT lung. Cardiomegaly. Unchanged central venous catheter. Small RIGHT effusion. Mild edema at the RIGHT lung base. IMPRESSION: Worsening aeration, now with complete white out of the LEFT lung. Electronically Signed   By: Elsie Stain M.D.   On: 07/05/2016 07:32   Dg Chest Port 1 View  Result Date: 07/04/2016 CLINICAL DATA:  Postop check EXAM: PORTABLE CHEST 1 VIEW COMPARISON:  07/04/2016 FINDINGS: Postoperative changes in the mediastinum. Sternotomy wires are present. Cardiac valve prostheses. Persistent diffuse white out of the left lung likely representing a combination of large effusion, infiltration, and atelectasis. Small right pleural effusion with basilar infiltration or atelectasis. No pneumothorax. Right PICC line with tip over the low SVC region. No significant changes since previous study. IMPRESSION: Persistent opacification of the left hemi thorax consistent with pleural effusion, atelectasis, and/or infiltration. Small right pleural effusion with basilar infiltration or atelectasis. Stable appearance since previous study. Electronically Signed   By: Burman Nieves M.D.   On: 07/04/2016 21:09    ASSESSMENT / PLAN:  Atelectasis/collapse with complete opacification L hemithorax. No significant improvement with chest PT, mucomyst, FOB 10/7.  Small L pleural effusion   REC -  Continue aggressive pulmonary hygiene - chest vest, mucomyst, flutter valve Mobilize  Plan for repeat bronch by CVTS.  PCCM is available if needed for the procedure.   Chilton Greathouse MD [facility] Pager [phone] If no answer or after 3pm call: [phone] 07/06/2016, 9:04 AM

## 2016-07-06 NOTE — Progress Notes (Addendum)
Nutrition Follow Up  DOCUMENTATION CODES:   Morbid obesity  INTERVENTION:    Once diet advanced, re-order Glucerna Shake po TID, each supplement provides 220 kcal and 10 grams of protein  NUTRITION DIAGNOSIS:   Increased nutrient needs related to  (post-op healing) as evidenced by estimated needs, ongoing  GOAL:   Patient will meet greater than or equal to 90% of their needs, progressing  MONITOR:   PO intake, Supplement acceptance, Labs, Weight trends, Skin, I & O's  ASSESSMENT:   75 yo Female admitted 06/24/2016 due to 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  Patient undergoing bronchoscopy during visit. No % PO intake records in flowsheets. RN to advance diet back this afternoon. Was receiving Glucerna Shakes prior to NPO status. Labs and medications reviewed.  Diet Order:  Diet NPO time specified  Skin:  Reviewed, no issues   CBG (last 3)   Recent Labs  07/05/16 2107 07/06/16 0901 07/06/16 1241  GLUCAP 148* 108* 127*   Last BM:  10/7  Height:   Ht Readings from Last 1 Encounters:  07/03/16 5\' 1"  (1.549 m)    Weight:   Wt Readings from Last 1 Encounters:  07/06/16 246 lb 11.1 oz (111.9 kg)    Ideal Body Weight:  45.4 kg  BMI:  Body mass index is 46.61 kg/m.  Estimated Nutritional Needs:   Kcal:  1600-1800  Protein:  80-90 gm  Fluid:  1.6-1.8 L  EDUCATION NEEDS:   No education needs identified at this time  Maureen ChattersKatie Vasily Fedewa, RD, LDN Pager #: [phone] After-Hours Pager #: [phone]

## 2016-07-07 ENCOUNTER — Inpatient Hospital Stay (HOSPITAL_COMMUNITY): Payer: Medicare Other | Admitting: Certified Registered"

## 2016-07-07 ENCOUNTER — Encounter (HOSPITAL_COMMUNITY): Payer: Self-pay | Admitting: Certified Registered"

## 2016-07-07 ENCOUNTER — Encounter (HOSPITAL_COMMUNITY)
Admission: RE | Disposition: E | Payer: Self-pay | Source: Ambulatory Visit | Attending: Thoracic Surgery (Cardiothoracic Vascular Surgery)

## 2016-07-07 ENCOUNTER — Inpatient Hospital Stay (HOSPITAL_COMMUNITY): Payer: Medicare Other

## 2016-07-07 DIAGNOSIS — J9819 Other pulmonary collapse: Secondary | ICD-10-CM

## 2016-07-07 HISTORY — PX: VIDEO BRONCHOSCOPY: SHX5072

## 2016-07-07 LAB — CBC
HCT: 29.9 % — ABNORMAL LOW (ref 36.0–46.0)
HCT: 30.6 % — ABNORMAL LOW (ref 36.0–46.0)
HEMOGLOBIN: 9.3 g/dL — AB (ref 12.0–15.0)
Hemoglobin: 9.7 g/dL — ABNORMAL LOW (ref 12.0–15.0)
MCH: 30.6 pg (ref 26.0–34.0)
MCH: 30.4 pg (ref 26.0–34.0)
MCHC: 31.1 g/dL (ref 30.0–36.0)
MCHC: 31.7 g/dL (ref 30.0–36.0)
MCV: 98.4 fL (ref 78.0–100.0)
MCV: 95.9 fL (ref 78.0–100.0)
PLATELETS: 257 10*3/uL (ref 150–400)
Platelets: 262 10*3/uL (ref 150–400)
RBC: 3.04 MIL/uL — ABNORMAL LOW (ref 3.87–5.11)
RBC: 3.19 MIL/uL — ABNORMAL LOW (ref 3.87–5.11)
RDW: 18 % — AB (ref 11.5–15.5)
RDW: 17.7 % — ABNORMAL HIGH (ref 11.5–15.5)
WBC: 9.4 10*3/uL (ref 4.0–10.5)
WBC: 10.1 10*3/uL (ref 4.0–10.5)

## 2016-07-07 LAB — BASIC METABOLIC PANEL
ANION GAP: 13 (ref 5–15)
Anion gap: 13 (ref 5–15)
BUN: 43 mg/dL — AB (ref 6–20)
BUN: 41 mg/dL — ABNORMAL HIGH (ref 6–20)
CALCIUM: 8.6 mg/dL — AB (ref 8.9–10.3)
CO2: 36 mmol/L — ABNORMAL HIGH (ref 22–32)
CO2: 37 mmol/L — ABNORMAL HIGH (ref 22–32)
CREATININE: 1.17 mg/dL — AB (ref 0.44–1.00)
Calcium: 8.7 mg/dL — ABNORMAL LOW (ref 8.9–10.3)
Chloride: 90 mmol/L — ABNORMAL LOW (ref 101–111)
Chloride: 89 mmol/L — ABNORMAL LOW (ref 101–111)
Creatinine, Ser: 1.2 mg/dL — ABNORMAL HIGH (ref 0.44–1.00)
GFR calc Af Amer: 51 mL/min — ABNORMAL LOW (ref >60)
GFR calc Af Amer: 50 mL/min — ABNORMAL LOW (ref >60)
GFR calc non Af Amer: 43 mL/min — ABNORMAL LOW (ref >60)
GFR, EST NON AFRICAN AMERICAN: 44 mL/min — AB (ref >60)
GLUCOSE: 143 mg/dL — AB (ref 65–99)
Glucose, Bld: 122 mg/dL — ABNORMAL HIGH (ref 65–99)
Potassium: 3 mmol/L — ABNORMAL LOW (ref 3.5–5.1)
Potassium: 2.9 mmol/L — ABNORMAL LOW (ref 3.5–5.1)
Sodium: 139 mmol/L (ref 135–145)
Sodium: 139 mmol/L (ref 135–145)

## 2016-07-07 LAB — TYPE AND SCREEN
ABO/RH(D): A POS
ANTIBODY SCREEN: NEGATIVE
UNIT DIVISION: 0
UNIT DIVISION: 0
Unit division: 0
Unit division: 0
Unit division: 0

## 2016-07-07 LAB — BLOOD GAS, ARTERIAL
Acid-Base Excess: 11.3 mmol/L — ABNORMAL HIGH (ref 0.0–2.0)
Bicarbonate: 36.7 mmol/L — ABNORMAL HIGH (ref 20.0–28.0)
Drawn by: 313941
FIO2: 50
MECHVT: 390 mL
O2 Saturation: 95.3 %
PEEP: 5 cmH2O
Patient temperature: 98.2
RATE: 14 resp/min
pCO2 arterial: 62.7 mmHg — ABNORMAL HIGH (ref 32.0–48.0)
pH, Arterial: 7.384 (ref 7.350–7.450)
pO2, Arterial: 82.3 mmHg — ABNORMAL LOW (ref 83.0–108.0)

## 2016-07-07 LAB — PROTIME-INR
INR: 2.17
PROTHROMBIN TIME: 24.5 s — AB (ref 11.4–15.2)

## 2016-07-07 LAB — GLUCOSE, CAPILLARY
GLUCOSE-CAPILLARY: 137 mg/dL — AB (ref 65–99)
GLUCOSE-CAPILLARY: 99 mg/dL (ref 65–99)
Glucose-Capillary: 134 mg/dL — ABNORMAL HIGH (ref 65–99)

## 2016-07-07 LAB — TRIGLYCERIDES: Triglycerides: 86 mg/dL (ref <150)

## 2016-07-07 LAB — PATHOLOGIST SMEAR REVIEW: PATH REVIEW: NEGATIVE

## 2016-07-07 SURGERY — BRONCHOSCOPY, VIDEO-ASSISTED
Anesthesia: General

## 2016-07-07 MED ORDER — PHENYLEPHRINE 40 MCG/ML (10ML) SYRINGE FOR IV PUSH (FOR BLOOD PRESSURE SUPPORT)
PREFILLED_SYRINGE | INTRAVENOUS | Status: AC
  Filled 2016-07-07: qty 10

## 2016-07-07 MED ORDER — SODIUM CHLORIDE 0.9 % IV SOLN: INTRAVENOUS | Status: DC

## 2016-07-07 MED ORDER — POTASSIUM CHLORIDE 10 MEQ/50ML IV SOLN
10.0000 meq | INTRAVENOUS | Status: AC | PRN
Start: 2016-07-07 — End: 2016-07-07
  Administered 2016-07-07 (×3): 10 meq via INTRAVENOUS
  Filled 2016-07-07: qty 50

## 2016-07-07 MED ORDER — ROCURONIUM BROMIDE 10 MG/ML (PF) SYRINGE
PREFILLED_SYRINGE | INTRAVENOUS | Status: DC | PRN
  Administered 2016-07-07 (×2): 50 mg via INTRAVENOUS

## 2016-07-07 MED ORDER — ONDANSETRON HCL 4 MG/2ML IJ SOLN
INTRAMUSCULAR | Status: AC
  Filled 2016-07-07: qty 2

## 2016-07-07 MED ORDER — PANTOPRAZOLE SODIUM 40 MG PO PACK
40.0000 mg | PACK | Freq: Every day | ORAL | Status: DC
  Administered 2016-07-07 – 2016-07-13 (×7): 40 mg
  Filled 2016-07-07 (×7): qty 20

## 2016-07-07 MED ORDER — POTASSIUM CHLORIDE 10 MEQ/50ML IV SOLN
10.0000 meq | INTRAVENOUS | Status: AC
  Administered 2016-07-07 (×5): 10 meq via INTRAVENOUS
  Filled 2016-07-07: qty 50

## 2016-07-07 MED ORDER — PROPOFOL 10 MG/ML IV BOLUS
INTRAVENOUS | Status: AC
  Filled 2016-07-07: qty 20

## 2016-07-07 MED ORDER — 0.9 % SODIUM CHLORIDE (POUR BTL) OPTIME
TOPICAL | Status: DC | PRN
  Administered 2016-07-07: 1000 mL

## 2016-07-07 MED ORDER — FENTANYL CITRATE (PF) 250 MCG/5ML IJ SOLN
INTRAMUSCULAR | Status: AC
  Filled 2016-07-07: qty 5

## 2016-07-07 MED ORDER — FENTANYL CITRATE (PF) 100 MCG/2ML IJ SOLN
50.0000 ug | INTRAMUSCULAR | Status: DC | PRN
  Administered 2016-07-13: 50 ug via INTRAVENOUS
  Filled 2016-07-07 (×3): qty 2

## 2016-07-07 MED ORDER — ALBUMIN HUMAN 5 % IV SOLN
12.5000 g | Freq: Once | INTRAVENOUS | Status: AC
  Administered 2016-07-07: 12.5 g via INTRAVENOUS

## 2016-07-07 MED ORDER — PHENYLEPHRINE HCL 10 MG/ML IJ SOLN
INTRAMUSCULAR | Status: DC | PRN
  Administered 2016-07-07: 80 ug via INTRAVENOUS

## 2016-07-07 MED ORDER — SODIUM CHLORIDE 0.9 % IJ SOLN
INTRAMUSCULAR | Status: AC
  Filled 2016-07-07: qty 10

## 2016-07-07 MED ORDER — EPINEPHRINE PF 1 MG/ML IJ SOLN
INTRAMUSCULAR | Status: AC
  Filled 2016-07-07: qty 2

## 2016-07-07 MED ORDER — POTASSIUM CHLORIDE 10 MEQ/50ML IV SOLN
10.0000 meq | INTRAVENOUS | Status: AC
  Administered 2016-07-07 (×3): 10 meq via INTRAVENOUS
  Filled 2016-07-07 (×3): qty 50

## 2016-07-07 MED ORDER — METOPROLOL TARTRATE 12.5 MG HALF TABLET
12.5000 mg | ORAL_TABLET | Freq: Once | ORAL | Status: DC
  Filled 2016-07-07: qty 1

## 2016-07-07 MED ORDER — CHLORHEXIDINE GLUCONATE 0.12 % MT SOLN
15.0000 mL | Freq: Once | OROMUCOSAL | Status: AC
  Administered 2016-07-08: 15 mL via OROMUCOSAL
  Filled 2016-07-07: qty 15

## 2016-07-07 MED ORDER — MIDAZOLAM HCL 2 MG/2ML IJ SOLN
INTRAMUSCULAR | Status: AC
  Filled 2016-07-07: qty 2

## 2016-07-07 MED ORDER — PROPOFOL 10 MG/ML IV BOLUS
INTRAVENOUS | Status: DC | PRN
  Administered 2016-07-07: 100 mg via INTRAVENOUS

## 2016-07-07 MED ORDER — FENTANYL CITRATE (PF) 100 MCG/2ML IJ SOLN
50.0000 ug | INTRAMUSCULAR | Status: DC | PRN
  Administered 2016-07-07 – 2016-07-12 (×7): 50 ug via INTRAVENOUS
  Filled 2016-07-07 (×5): qty 2

## 2016-07-07 MED ORDER — FENTANYL CITRATE (PF) 100 MCG/2ML IJ SOLN
INTRAMUSCULAR | Status: DC | PRN
  Administered 2016-07-07: 50 ug via INTRAVENOUS

## 2016-07-07 MED ORDER — ARTIFICIAL TEARS OP OINT
TOPICAL_OINTMENT | OPHTHALMIC | Status: AC
  Filled 2016-07-07: qty 3.5

## 2016-07-07 MED ORDER — ALTEPLASE 2 MG IJ SOLR: 2.0000 mg | Freq: Once | INTRAMUSCULAR | Status: DC

## 2016-07-07 MED ORDER — ALBUTEROL SULFATE HFA 108 (90 BASE) MCG/ACT IN AERS
INHALATION_SPRAY | RESPIRATORY_TRACT | Status: AC
  Filled 2016-07-07: qty 6.7

## 2016-07-07 MED ORDER — PROPOFOL 500 MG/50ML IV EMUL
INTRAVENOUS | Status: DC | PRN
  Administered 2016-07-07: 50 ug/kg/min via INTRAVENOUS

## 2016-07-07 MED ORDER — CHLORHEXIDINE GLUCONATE 0.12 % MT SOLN
15.0000 mL | Freq: Two times a day (BID) | OROMUCOSAL | Status: DC
  Administered 2016-07-07 – 2016-07-11 (×7): 15 mL via OROMUCOSAL
  Filled 2016-07-07 (×4): qty 15

## 2016-07-07 MED ORDER — ORAL CARE MOUTH RINSE
15.0000 mL | Freq: Two times a day (BID) | OROMUCOSAL | Status: DC
  Administered 2016-07-07 – 2016-07-08 (×3): 15 mL via OROMUCOSAL

## 2016-07-07 MED ORDER — MIDAZOLAM HCL 2 MG/2ML IJ SOLN: 1.0000 mg | INTRAMUSCULAR | Status: DC | PRN

## 2016-07-07 MED ORDER — PHENYLEPHRINE HCL 10 MG/ML IJ SOLN
0.0000 ug/min | INTRAVENOUS | Status: DC
  Administered 2016-07-07: 40 ug/min via INTRAVENOUS
  Administered 2016-07-07: 25 ug/min via INTRAVENOUS
  Administered 2016-07-08: 55 ug/min via INTRAVENOUS
  Administered 2016-07-08 (×4): 70 ug/min via INTRAVENOUS
  Filled 2016-07-07 (×8): qty 1

## 2016-07-07 MED ORDER — MIDAZOLAM HCL 2 MG/2ML IJ SOLN
1.0000 mg | INTRAMUSCULAR | Status: DC | PRN
  Administered 2016-07-11: 1 mg via INTRAVENOUS
  Filled 2016-07-07: qty 2

## 2016-07-07 MED ORDER — PROPOFOL 1000 MG/100ML IV EMUL
0.0000 ug/kg/min | INTRAVENOUS | Status: DC
  Administered 2016-07-07: 40 ug/kg/min via INTRAVENOUS
  Administered 2016-07-07: 20 ug/kg/min via INTRAVENOUS
  Administered 2016-07-08 – 2016-07-09 (×8): 25 ug/kg/min via INTRAVENOUS
  Filled 2016-07-07 (×12): qty 100

## 2016-07-07 MED ORDER — EPINEPHRINE PF 1 MG/ML IJ SOLN
INTRAMUSCULAR | Status: DC | PRN
  Administered 2016-07-07: 1 mg via ENDOTRACHEOPULMONARY

## 2016-07-07 MED ORDER — PHENYLEPHRINE HCL 10 MG/ML IJ SOLN
INTRAVENOUS | Status: DC | PRN
  Administered 2016-07-07: 30 ug/min via INTRAVENOUS

## 2016-07-07 MED ORDER — LACTATED RINGERS IV SOLN
INTRAVENOUS | Status: DC | PRN
  Administered 2016-07-07: 12:00:00 via INTRAVENOUS

## 2016-07-07 MED ORDER — POTASSIUM CHLORIDE 20 MEQ/15ML (10%) PO SOLN
40.0000 meq | Freq: Three times a day (TID) | ORAL | Status: DC
  Administered 2016-07-07 – 2016-07-13 (×18): 40 meq via ORAL
  Filled 2016-07-07 (×18): qty 30

## 2016-07-07 MED ORDER — SUCCINYLCHOLINE CHLORIDE 200 MG/10ML IV SOSY
PREFILLED_SYRINGE | INTRAVENOUS | Status: DC | PRN
  Administered 2016-07-07: 120 mg via INTRAVENOUS

## 2016-07-07 MED ORDER — CHLORHEXIDINE GLUCONATE 0.12 % MT SOLN
OROMUCOSAL | Status: AC
  Administered 2016-07-07: 14:00:00
  Filled 2016-07-07: qty 15

## 2016-07-07 SURGICAL SUPPLY — 18 items
BRUSH CYTOL CELLEBRITY 1.5X140 (MISCELLANEOUS) IMPLANT
CANISTER SUCTION 2500CC (MISCELLANEOUS) ×2 IMPLANT
CONT SPEC 4OZ CLIKSEAL STRL BL (MISCELLANEOUS) ×2 IMPLANT
COVER TABLE BACK 60X90 (DRAPES) ×2 IMPLANT
FORCEPS BIOP RJ4 1.8 (CUTTING FORCEPS) ×2 IMPLANT
GAUZE SPONGE 4X4 12PLY STRL (GAUZE/BANDAGES/DRESSINGS) ×2 IMPLANT
KIT CLEAN ENDO COMPLIANCE (KITS) ×2 IMPLANT
KIT ROOM TURNOVER OR (KITS) ×2 IMPLANT
MARKER SKIN DUAL TIP RULER LAB (MISCELLANEOUS) ×2 IMPLANT
NEEDLE BIOPSY TRANSBRONCH 21G (NEEDLE) IMPLANT
NS IRRIG 1000ML POUR BTL (IV SOLUTION) ×2 IMPLANT
OIL SILICONE PENTAX (PARTS (SERVICE/REPAIRS)) ×2 IMPLANT
SYR 20ML ECCENTRIC (SYRINGE) ×2 IMPLANT
SYR 30ML SLIP (SYRINGE) ×2 IMPLANT
SYR TB 1ML LUER SLIP (SYRINGE) ×2 IMPLANT
TOWEL OR 17X24 6PK STRL BLUE (TOWEL DISPOSABLE) ×2 IMPLANT
TRAP SPECIMEN MUCOUS 40CC (MISCELLANEOUS) ×4 IMPLANT
TUBE CONNECTING 20X1/4 (TUBING) ×4 IMPLANT

## 2016-07-07 NOTE — Anesthesia Preprocedure Evaluation (Signed)
Anesthesia Evaluation  Patient identified by MRN, date of birth, ID band Patient awake    Reviewed: Allergy & Precautions, NPO status , Patient's Chart, lab work & pertinent test results  Airway Mallampati: II   Neck ROM: Full    Dental  (+) Teeth Intact, Dental Advisory Given   Pulmonary former smoker,     + decreased breath sounds+ wheezing      Cardiovascular hypertension,  Rhythm:Irregular Rate:Tachycardia     Neuro/Psych    GI/Hepatic   Endo/Other    Renal/GU      Musculoskeletal   Abdominal   Peds  Hematology   Anesthesia Other Findings   Reproductive/Obstetrics                             Anesthesia Physical Anesthesia Plan  ASA: IV and emergent  Anesthesia Plan: General   Post-op Pain Management:    Induction: Intravenous  Airway Management Planned: Oral ETT  Additional Equipment:   Intra-op Plan:   Post-operative Plan: Post-operative intubation/ventilation  Informed Consent: I have reviewed the patients History and Physical, chart, labs and discussed the procedure including the risks, benefits and alternatives for the proposed anesthesia with the patient or authorized representative who has indicated his/her understanding and acceptance.   Dental advisory given  Plan Discussed with: CRNA  Anesthesia Plan Comments:         Anesthesia Quick Evaluation

## 2016-07-07 NOTE — Progress Notes (Signed)
Patient refused CPT at this time since she is going to OR to be intubated and bronched. MD aware.

## 2016-07-07 NOTE — Transfer of Care (Signed)
Immediate Anesthesia Transfer of Care Note  Patient: Ann Potter  Procedure(s) Performed: Procedure(s): VIDEO BRONCHOSCOPY/ INTUBATION/ REMOVAL OF SECRETIONS (N/A)  Patient Location: PACU  Anesthesia Type:General  Level of Consciousness: sedated and Patient remains intubated per anesthesia plan  Airway & Oxygen Therapy: Patient remains intubated per anesthesia plan and Patient placed on Ventilator (see vital sign flow sheet for setting)  Post-op Assessment: Report given to RN and Post -op Vital signs reviewed and stable  Post vital signs: Reviewed and stable  Last Vitals:  Vitals:   07/08/2016 0904 07/15/2016 1000  BP: (!) 122/47 90/68  Pulse:  63  Resp:  18  Temp:      Last Pain:  Vitals:   06/30/2016 0733  TempSrc: Oral  PainSc:       Patients Stated Pain Goal: 0 (07/06/16 1800)  Complications: No apparent anesthesia complications

## 2016-07-07 NOTE — Progress Notes (Signed)
      [address]       [name] [id number]             [phone]       Case discussed with Dr Cornelius Moraswen, with little improvement from yesterday bronch plan to proceed with Bronch in OR and ventilate patient and leave intubated after, Dr Cornelius Moraswen has discussed with patients son. Or time this am available to proceed  The goals risks and alternatives of the planned surgical procedure bronchoscopy  have been discussed with the patient in detail. The risks of the procedure including death, infection, stroke, myocardial infarction, bleeding, blood transfusion have all been discussed specifically.  I have quoted Ann Potter a 3 % of perioperative mortality and a complication rate as high as 20 %. The patient's questions have been answered.Ann Potter is willing  to proceed with the planned procedure.

## 2016-07-07 NOTE — Anesthesia Postprocedure Evaluation (Signed)
Anesthesia Post Note  Patient: Ann PoeGloria Potter  Procedure(s) Performed: Procedure(s) (LRB): VIDEO BRONCHOSCOPY/ INTUBATION/ REMOVAL OF SECRETIONS (N/A)  Patient location during evaluation: SICU Anesthesia Type: General Level of consciousness: sedated and patient remains intubated per anesthesia plan Pain management: pain level controlled Vital Signs Assessment: post-procedure vital signs reviewed and stable Respiratory status: patient on ventilator - see flowsheet for VS and patient remains intubated per anesthesia plan Cardiovascular status: blood pressure returned to baseline Anesthetic complications: no    Last Vitals:  Vitals:   2015-12-30 1730 2015-12-30 1745  BP: (!) 89/40 (!) 89/45  Pulse: 66 68  Resp: 16 (!) 21  Temp:      Last Pain:  Vitals:   2015-12-30 1652  TempSrc: Axillary  PainSc:                  Makai Agostinelli COKER

## 2016-07-07 NOTE — Care Management Important Message (Signed)
Important Message  Patient Details  Name: Ann PoeGloria Cappuccio MRN: [id number] Date of Birth: 28-Nov-1940   Medicare Important Message Given:  Yes    Lateefa Crosby [name] 07/11/2016, 9:02 AM

## 2016-07-07 NOTE — Progress Notes (Signed)
RN spoke with son Gerlene BurdockRichard over phone and given update regarding plan for today; will cont. To monitor.  Benjamine SpragueYates, Lawayne Hartig A

## 2016-07-07 NOTE — Progress Notes (Signed)
Patient off floor at this time. CPT not given at this time.

## 2016-07-07 NOTE — Brief Op Note (Addendum)
      [address]       [name] [id number]             [phone]      09/20/2016  1:03 PM  PATIENT:  Sherle PoeGloria Rayman  75 y.o. female  PRE-OPERATIVE DIAGNOSIS:  Collapsed Lung- left  POST-OPERATIVE DIAGNOSIS:  Collapsed Lung same  PROCEDURE:  Procedure(s): VIDEO BRONCHOSCOPY/ INTUBATION/ REMOVAL OF SECRETIONS (N/A) Findings- see photo in epic, no endobronchial lesions , watery  secretions suction cytology and cultures sent  SURGEON:  Surgeon(s) and Role:    * Delight OvensEdward B Dak Szumski, MD - Primary   ANESTHESIA:   general  EBL:  No intake/output data recorded.  BLOOD ADMINISTERED:none  DRAINS: none   LOCAL MEDICATIONS USED:  NONE  SPECIMEN:  Source of Specimen:  bronch washing   DISPOSITION OF SPECIMEN:  micro and cytology  COUNTS:  YES  DICTATION: .Dragon Dictation  PLAN OF CARE: transfer back to icu on vent  PATIENT DISPOSITION:  transfer back to ICU on vent    Delay start of Pharmacological VTE agent (>24hrs) due to surgical blood loss or risk of bleeding: on coumadin

## 2016-07-07 NOTE — Anesthesia Procedure Notes (Signed)
Procedure Name: Intubation Date/Time: 07/03/2016 12:25 PM Performed by: Lanell MatarBAKER, Machell Wirthlin M Pre-anesthesia Checklist: Patient identified, Emergency Drugs available, Suction available and Patient being monitored Patient Re-evaluated:Patient Re-evaluated prior to inductionOxygen Delivery Method: Circle System Utilized Preoxygenation: Pre-oxygenation with 100% oxygen Intubation Type: IV induction, Rapid sequence and Cricoid Pressure applied Laryngoscope Size: Miller and 2 Grade View: Grade I Tube type: Oral Tube size: 8.5 mm Number of attempts: 1 Airway Equipment and Method: Stylet Placement Confirmation: ETT inserted through vocal cords under direct vision,  positive ETCO2 and breath sounds checked- equal and bilateral Secured at: 21 cm Tube secured with: Tape Dental Injury: Teeth and Oropharynx as per pre-operative assessment

## 2016-07-07 NOTE — Progress Notes (Signed)
[address]       Ann Potter [address]             [id number]        CARDIOTHORACIC SURGERY PROGRESS NOTE   R12 Days Post-Op Procedure(s) (LRB): MITRAL VALVE (MV) REPLACEMENT (N/A) TRICUSPID VALVE REPAIR (N/A) MAZE (N/A) TRANSESOPHAGEAL ECHOCARDIOGRAM (TEE) (N/A)  Subjective: No new complaints.  Dyspneic with any activity  Objective: Vital signs: BP Readings from Last 1 Encounters:  07/14/2016 (!) 117/57   Pulse Readings from Last 1 Encounters:  07/12/2016 74   Resp Readings from Last 1 Encounters:  07/02/2016 (!) 27   Temp Readings from Last 1 Encounters:  07/11/2016 97.6 F (36.4 C) (Oral)    Hemodynamics:    Physical Exam:  Rhythm:   Junctional w/ HR 65-75  Breath sounds: Diminished on left side, scattered rhonchi  Heart sounds:  RRR  Incisions:  Clean and dry  Abdomen:  Soft, non-distended, non-tender  Extremities:  Warm, well-perfused    Intake/Output from previous day: 10/09 0701 - 10/10 0700 In: 1071.5 [P.O.:290; I.V.:269; Blood:112.5; IV Piggyback:400] Out: 1250 [Urine:1250] Intake/Output this shift: No intake/output data recorded.  Lab Results:  CBC: Recent Labs  07/06/16 0553 07/22/2016 0410  WBC 6.8 9.4  HGB 8.0* 9.3*  HCT 25.8* 29.9*  PLT 271 257    BMET:  Recent Labs  07/06/16 0553 07/23/2016 0410  NA 138 139  K 3.3* 3.0*  CL 90* 90*  CO2 37* 36*  GLUCOSE 119* 143*  BUN 48* 43*  CREATININE 1.29* 1.17*  CALCIUM 8.3* 8.6*     PT/INR:   Recent Labs  07/08/2016 0410  LABPROT 24.5*  INR 2.17    CBG (last 3)   Recent Labs  07/06/16 1610 07/06/16 2123 07/15/2016 0730  GLUCAP 116* 160* 137*    ABG    Component Value Date/Time   PHART 7.337 (L) 06/26/2016 0859   PCO2ART 43.2 06/26/2016 0859   PO2ART 90.0 06/26/2016 0859   HCO3 23.2 06/26/2016 0859   TCO2 34 07/02/2016 1749   ACIDBASEDEF 3.0 (H) 06/26/2016 0859   O2SAT 72.3 07/01/2016 0325    CXR: No improvement  Assessment/Plan: S/P  Procedure(s) (LRB): MITRAL VALVE (MV) REPLACEMENT (N/A) TRICUSPID VALVE REPAIR (N/A) MAZE (N/A) TRANSESOPHAGEAL ECHOCARDIOGRAM (TEE) (N/A)  Still relatively stable POD12 but worsening acute on chronic respiratory failure 100% collapse of left lung persists c/w atelectasis due to secretions and poor pulmonary toilet, now s/p bedside FOB x2 without improvement HCAP - no culture results yet available from FOB performed 10/7 - day #3 Fortaz day #1 Vanc Maintaining junctional rhythm w/ stable HR and BP Permanent atrial fibrillation now s/p maze procedure - HR stable and well controlled Acute on chronic diastolic CHF with expected post-op volume excess, weight reportedly down 6 lbs yesterday still above preop baseline - I/O negative but inaccurate since Foley removed Hypokalemia - diuretic induced Expected post op acute blood loss anemia, Hgb up to 9.3 this morning Longstanding rheumatic heart disease  Pulmonary hypertension Chronic kidney disease stage III, creatinine stable/improved - diuresing well Expected post op acute blood loss anemia, Hgb stable Expected post op atelectasis, mild L>R Morbid obesity Physical deconditioning - severe Protein-depleted malnutrition Chronic venous insufficiency   At this point I think Ann Potter needs repeat bronch under general anesthesia to afford more compete evacuation of secretions.  Given her condition she may need to remain on vent on positive pressure ventilation at least overnight following bronch.  Discussed  with patient who is agreeable and discussed over the telephone with her son, Ann Potter.  All questions answered.  Purcell Nailslarence H Owen, MD 07/21/2016 8:02 AM

## 2016-07-07 NOTE — Plan of Care (Signed)
ABG shown to Dr. Isaiah SergeMannam

## 2016-07-07 NOTE — Anesthesia Procedure Notes (Signed)
Procedure Name: Intubation Date/Time: 07/15/2016 1:04 PM Performed by: Lanell MatarBAKER, Joselin Crandell M Pre-anesthesia Checklist: Patient identified, Emergency Drugs available, Suction available and Patient being monitored Patient Re-evaluated:Patient Re-evaluated prior to inductionOxygen Delivery Method: Circle System Utilized Preoxygenation: Pre-oxygenation with 100% oxygen Intubation Type: Inhalational induction Laryngoscope Size: Miller and 2 Grade View: Grade I Tube type: Subglottic suction tube Tube size: 8.0 mm Number of attempts: 1 Airway Equipment and Method: Stylet Placement Confirmation: ETT inserted through vocal cords under direct vision,  positive ETCO2 and breath sounds checked- equal and bilateral Secured at: 21 cm Tube secured with: Tape Dental Injury: Teeth and Oropharynx as per pre-operative assessment  Comments: Intubation by Rise PatienceSarah Bell, CRNA

## 2016-07-07 NOTE — Progress Notes (Signed)
Name: Ann Potter MRN: [id number] DOB: 1941/01/04    ADMISSION DATE:  06/05/2016 CONSULTATION DATE:  10/8  REFERRING MD :  Tyrone Sage   CHIEF COMPLAINT:  White-out L hemithorax   BRIEF PATIENT DESCRIPTION: 75yo female with multiple medical problems including AFib, asthma, CAD, CHF, severe mitral stenosis, severe TR admitted 9/28 for MV replacement, tricuspid repair and MAZE.  Post op recovery has been slow and now c/b complete white-out of L hemithorax likely thought r/t mucous plugging.  Initially thought to be pleural effusion and pt was sent to IR for pigtail placement, however CT chest revealed only small amt of effusion and dense atx/collapse.  She was tried on chest PT, mucomyst and FOB by CVTS on 10/7 without significant improvement.  PCCM consulted 10/8 to assist.   SIGNIFICANT EVENTS  9/28 MV replacement, Tricuspid repair, MAZE  10/7 FOB  10/9 Repeat FOB  STUDIES:  CT chest 10/6>>> Dense atelectasis and collapse of the left upper lobe and lingula. There also is atelectasis of the left lower lobe with mucous plugging in the lower lobe bronchi. There is only a small partially loculated left pleural effusion and also a small right pleural effusion. Decision was made not to place a percutaneous chest tube CXR 10/9 > persistent collapse of lt lung   HISTORY OF PRESENT ILLNESS:  75yo female with multiple medical problems including AFib, asthma, CAD, CHF, severe mitral stenosis, severe TR admitted 9/28 for MV replacement, tricuspid repair and MAZE.  Post op recovery has been slow and now c/b complete white-out of L hemithorax likely thought r/t mucous plugging.  Initially thought to be pleural effusion and pt was sent to IR for pigtail placement, however CT chest revealed only small amt of effusion and dense atx/collapse.  She was tried on chest PT, mucomyst and FOB by CVTS on 10/7 without significant improvement.  PCCM consulted 10/8 to assist.   PAST MEDICAL HISTORY :   has a  past medical history of Anxiety; Arthritis; Asthma; Atrial fibrillation (HCC); Atrial fibrillation, chronic (HCC) (01/25/2012); CAD (coronary artery disease); CHF (congestive heart failure) (HCC); Chronic diastolic congestive heart failure (HCC) (01/25/2012); Chronic lower back pain; Chronic renal insufficiency, stage III (moderate) (01/25/2012); Chronic venous insufficiency; COPD (chronic obstructive pulmonary disease) (HCC); Heart murmur; HLD (hyperlipidemia); HTN (hypertension); Hypothyroidism; Lower extremity edema; Macular degeneration; Mitral stenosis; Morbid obesity (HCC); Myocardial infarction (2007); Pulmonary hypertension; Rheumatic fever during childhood; Rheumatic heart disease; S/P Maze operation for atrial fibrillation (06/03/2016); 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  has a past surgical history that includes Multiple tooth extractions (1965); Tubal ligation (1984); TEE with cardioversion; TEE without cardioversion (N/A, 06/25/2015); Cardiac catheterization (12/2011; 04/16/2016); Cataract extraction w/ intraocular lens  implant, bilateral (Bilateral); Cardiac catheterization (N/A, 04/16/2016); Cardioversion; Mitral valve replacement (N/A, 06/07/2016); Tricuspid valve replacement (N/A, 06/03/2016); MAZE (N/A, 06/19/2016); and TEE without cardioversion (N/A, 06/02/2016). Prior to Admission medications   Medication Sig Start Date End Date Taking? Authorizing Provider  acetaminophen (TYLENOL) 650 MG CR tablet Take 650 mg by mouth every 8 (eight) hours as needed for pain.   Yes Historical Provider, MD  albuterol (PROVENTIL HFA;VENTOLIN HFA) 108 (90 BASE) MCG/ACT inhaler Inhale 2 puffs into the lungs every 6 (six) hours as needed. For shortness of breath   Yes Historical Provider, MD  diltiazem (CARDIZEM) 30 MG tablet Take 1 tablet (30 mg total) by mouth 2 (two) times daily. 07/03/15  Yes Antoine Poche,  MD  Fluticasone-Salmeterol (  ADVAIR) 250-50 MCG/DOSE AEPB Inhale 1 puff into the lungs 2 (two) times daily.   Yes Historical Provider, MD  levothyroxine (SYNTHROID, LEVOTHROID) 50 MCG tablet Take 50 mcg by mouth daily.   Yes Historical Provider, MD  loratadine (CLARITIN) 10 MG tablet Take 10 mg by mouth as needed for allergies.    Yes Historical Provider, MD  metolazone (ZAROXOLYN) 2.5 MG tablet Take 1 tab every other week;may take weekly for additional swelling as needed. Patient taking differently: Take 2.5 mg by mouth See admin instructions. Take 2.5 mg by mouth every other week;may take weekly for additional swelling as needed. 05/21/16  Yes Antoine Poche, MD  montelukast (SINGULAIR) 10 MG tablet Take 10 mg by mouth daily.    Yes Historical Provider, MD  potassium chloride SA (K-DUR,KLOR-CON) 20 MEQ tablet Take 2 tablets (40 mEq total) by mouth 2 (two) times daily. 04/17/16  Yes Arty Baumgartner, NP  rivaroxaban (XARELTO) 20 MG TABS tablet Take 1 tablet (20 mg total) by mouth daily with supper. 10/01/15  Yes Antoine Poche, MD  rosuvastatin (CRESTOR) 10 MG tablet Take 10 mg by mouth daily.   Yes Historical Provider, MD  torsemide (DEMADEX) 20 MG tablet Take 4 tablets (80 mg total) by mouth 2 (two) times daily. 04/08/16  Yes Antoine Poche, MD   Allergies  Allergen Reactions  . Relafen [Nabumetone] Other (See Comments)    Bladder pain    FAMILY HISTORY:  family history includes Cancer in her mother and sister; Deep vein thrombosis in her father; Heart disease (age of onset: 66) in her father; Stomach cancer in her brother. SOCIAL HISTORY:  reports that she has quit smoking. Her smoking use included Cigarettes. She started smoking about 56 years ago. She has a 11.00 pack-year smoking history. She has never used smokeless tobacco. She reports that she does not drink alcohol or use drugs.  REVIEW OF SYSTEMS:   As per HPI - All other systems reviewed and were neg.    SUBJECTIVE:    VITAL SIGNS: Temp:  [97.2 F (36.2 C)-98.6 F (37 C)] 97.6 F (36.4 C) (10/10 0733) Pulse Rate:  [50-74] 74 (10/10 0700) Resp:  [15-27] 27 (10/10 0700) BP: (99-136)/(40-93) 122/47 (10/10 0904) SpO2:  [93 %-100 %] 96 % (10/10 0911) Weight:  [240 lb 11.9 oz (109.2 kg)] 240 lb 11.9 oz (109.2 kg) (10/10 0500)  PHYSICAL EXAMINATION: General:  Obese female, No distress Neuro:  Awake, alert, appropriate, MAE, gen weakness  HEENT:  Mm moist, no JVD  Cardiovascular:  S1s2, RRR, No MRG Lungs:  Scattered crackles, Diminished breath sounds on left.  Abdomen:  Round, soft, non tender  Musculoskeletal:  Warm and dry, 2-3+ BLE edema    Recent Labs Lab 07/05/16 0410 07/06/16 0553 August 04, 2016 0410  NA 135 138 139  K 3.4* 3.3* 3.0*  CL 90* 90* 90*  CO2 36* 37* 36*  BUN 48* 48* 43*  CREATININE 1.22* 1.29* 1.17*  GLUCOSE 137* 119* 143*    Recent Labs Lab 07/05/16 0410 07/06/16 0553 Aug 04, 2016 0410  HGB 7.7* 8.0* 9.3*  HCT 24.3* 25.8* 29.9*  WBC 6.7 6.8 9.4  PLT 231 271 257   Dg Chest Port 1 View  Result Date: 2016-08-04 CLINICAL DATA:  75 year old female with shortness of Breath. Respiratory failure. Recent bronchoscopy for left lung collapse. Initial encounter. EXAM: PORTABLE CHEST 1 VIEW COMPARISON:  07/06/2016 and earlier. FINDINGS: Portable AP semi upright view at at 0534 hours. Unchanged opacification of the left hemi  thorax with obscuration of the left mediastinal contours. Sequelae of cardiac surgery. Right PICC line is stable. Stable ventilation in the right lung with veiling right lung base opacity. No pneumothorax. IMPRESSION: Stable chest with opacified left hemi thorax and veiling opacity at the right lung base compatible with right pleural effusion. Electronically Signed   By: Odessa FlemingH  Hall M.D.   On: 07/12/2016 08:09   Dg Chest Port 1 View  Result Date: 07/06/2016 CLINICAL DATA:  Bronchoscopy for left lung collapse. EXAM: PORTABLE CHEST 1 VIEW COMPARISON:  Earlier today  FINDINGS: Persistent white out of the left chest. Unchanged patchy airspace opacity at the right base with small pleural effusion. Cardiomegaly with mitral and tricuspid valve repair. Left atrial clip. No change from prior to suggest postoperative complication. No pneumothorax. IMPRESSION: 1. Stable exam after bronchoscopy. 2. Persistent white out of the left chest. 3. Right lower lobe airspace disease and small pleural effusion. Electronically Signed   By: Marnee SpringJonathon  Watts M.D.   On: 07/06/2016 12:42   Dg Chest Port 1 View  Result Date: 07/06/2016 CLINICAL DATA:  Chest pain EXAM: PORTABLE CHEST 1 VIEW COMPARISON:  07/05/2016 FINDINGS: Persistent opacification of left hemi thorax is noted. Postsurgical changes are again seen. A right-sided PICC line is noted again in satisfactory position. Right pleural effusion remains. Mild central vascular congestion is noted on the right. No acute bony abnormality is seen. IMPRESSION: Persistent opacification of the left hemi thorax. Small right effusion is noted. Electronically Signed   By: Alcide CleverMark  Lukens M.D.   On: 07/06/2016 07:43    ASSESSMENT / PLAN:  Atelectasis/collapse with complete opacification L hemithorax. No significant improvement with chest PT, mucomyst, FOB x 2 Noted to have slow mucosal ooze on left upper with friable mucosa, collapsible airways which is probably the reason for persistent collapse. Small L pleural effusion   REC -  Continue aggressive pulmonary hygiene - chest vest, mucomyst, flutter valve Plan for repeat bronch in OR by CVTS.  Coumadin held yesterday.   PCCM will continue to follow  Chilton GreathousePraveen Vandy Tsuchiya MD [facility] Pager [phone] If no answer or after 3pm call: [phone] 07/22/2016, 9:53 AM

## 2016-07-07 NOTE — Progress Notes (Signed)
Physical Therapy Treatment Patient Details Name: Ann Potter MRN: [id number] DOB: 1941-05-11 Today's Date: 07/16/2016    History of Present Illness Patient is a 75 yo female admitted 07/16/2016 due to 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   PMH:  morbid obesity, HOH, poor eyesight, mitral stenosis, tricuspid regurg, rheumatic heart disease, anxiety, Afib, CAD, MI, CHF, venous insufficiency, chronic LE edema, HTN, CKD, asthma, chronic back pain    PT Comments    Pt pleasant and in chair on arrival. RN assisted with chair today. Pt able to state 3/5 precautions and educated for all . Pt with noted left hemithorax with plans for intubation and bronch later today. Pt also with noted K of 3.0 and Dr.Owen present for session with stated approval and request for mobility at this time. Pt educated for transfers, gait and HEP. Will continue to see as medical stability allows.   HR 74-83 sats 90-92% on 6L with gait, 94% on 4L at rest BP 101/69 before and 122/47 after  Follow Up Recommendations  SNF;Supervision/Assistance - 24 hour     Equipment Recommendations       Recommendations for Other Services       Precautions / Restrictions Precautions Precautions: Sternal;Fall Precaution Comments:  Reviewed sternal precautions pt able to state 3/5    Mobility  Bed Mobility               General bed mobility comments: in chair before and after session  Transfers Overall transfer level: Needs assistance   Transfers: Sit to/from Stand Sit to Stand: Min assist;+2 safety/equipment;+2 physical assistance         General transfer comment: cues for hand placement with assist for anterior translation and elevation x 2 trials   Ambulation/Gait Ambulation/Gait assistance: Min assist;+2 safety/equipment Ambulation Distance (Feet): 20 Feet Assistive device: Rolling walker (2 wheeled) Gait Pattern/deviations: Step-through  pattern;Decreased stride length;Trunk flexed   Gait velocity interpretation: Below normal speed for age/gender General Gait Details: cues for posture and safety with pt maintaining flexed posture, chair to follow as pt with poor activity tolerance 2 trials of 20' with seated rest between trials   Stairs            Wheelchair Mobility    Modified Rankin (Stroke Patients Only)       Balance     Sitting balance-Leahy Scale: Fair       Standing balance-Leahy Scale: Poor                      Cognition Arousal/Alertness: Awake/alert Behavior During Therapy: WFL for tasks assessed/performed Overall Cognitive Status: Within Functional Limits for tasks assessed       Memory: Decreased recall of precautions              Exercises General Exercises - Lower Extremity Long Arc Quad: Both;15 reps;Seated;AAROM Hip ABduction/ADduction: AROM;Both;Seated;15 reps Hip Flexion/Marching: AAROM;Both;15 reps;Seated    General Comments        Pertinent Vitals/Pain Pain Assessment: No/denies pain    Home Living                      Prior Function            PT Goals (current goals can now be found in the care plan section) Progress towards PT goals: Progressing toward goals    Frequency           PT Plan Current  plan remains appropriate    Co-evaluation             End of Session Equipment Utilized During Treatment: Gait belt;Oxygen Activity Tolerance: Patient tolerated treatment well Patient left: with call bell/phone within reach;with nursing/sitter in room;in chair     Time: [id number] PT Time Calculation (min) (ACUTE ONLY): 26 min  Charges:  $Gait Training: 8-22 mins $Therapeutic Exercise: 8-22 mins                    G Codes:      Delorse Lekabor, Montrelle Eddings Beth 2016-04-08, 9:06 AM Delaney MeigsMaija Tabor Jahiem Franzoni, PT [phone]

## 2016-07-07 NOTE — Progress Notes (Signed)
[facility] BRIEF SICU PROGRESS NOTE  Day of Surgery  S/P Procedure(s) (LRB): VIDEO BRONCHOSCOPY/ INTUBATION/ REMOVAL OF SECRETIONS (N/A)   Clinically very stable since bronch although f/u CXR without improvement in left lung aeration. Discussed findings at bronch and patient's condition at length with her son and daughter at the bedside  Plan: Keep intubated overnight and reassess CXR in am.  Consider repeat bronch +/- repeat CT to r/o worsening left pleural effusion  Purcell Nailslarence H Lorette Peterkin, MD 07/22/2016

## 2016-07-08 ENCOUNTER — Encounter (HOSPITAL_COMMUNITY): Payer: Self-pay | Admitting: Cardiothoracic Surgery

## 2016-07-08 ENCOUNTER — Inpatient Hospital Stay (HOSPITAL_COMMUNITY): Payer: Medicare Other

## 2016-07-08 LAB — BLOOD GAS, ARTERIAL
ACID-BASE EXCESS: 10.5 mmol/L — AB (ref 0.0–2.0)
BICARBONATE: 35.1 mmol/L — AB (ref 20.0–28.0)
Drawn by: 441371
FIO2: 50
LHR: 14 {breaths}/min
MECHVT: 390 mL
O2 SAT: 94.2 %
PEEP/CPAP: 5 cmH2O
PH ART: 7.447 (ref 7.350–7.450)
Patient temperature: 98.3
pCO2 arterial: 51.6 mmHg — ABNORMAL HIGH (ref 32.0–48.0)
pO2, Arterial: 70.8 mmHg — ABNORMAL LOW (ref 83.0–108.0)

## 2016-07-08 LAB — ACID FAST SMEAR (AFB, MYCOBACTERIA)
Acid Fast Smear: NEGATIVE
Acid Fast Smear: NEGATIVE

## 2016-07-08 LAB — GLUCOSE, CAPILLARY
GLUCOSE-CAPILLARY: 129 mg/dL — AB (ref 65–99)
Glucose-Capillary: 146 mg/dL — ABNORMAL HIGH (ref 65–99)
Glucose-Capillary: 115 mg/dL — ABNORMAL HIGH (ref 65–99)
Glucose-Capillary: 113 mg/dL — ABNORMAL HIGH (ref 65–99)

## 2016-07-08 LAB — BASIC METABOLIC PANEL
Anion gap: 12 (ref 5–15)
BUN: 37 mg/dL — ABNORMAL HIGH (ref 6–20)
CO2: 36 mmol/L — ABNORMAL HIGH (ref 22–32)
Calcium: 8.5 mg/dL — ABNORMAL LOW (ref 8.9–10.3)
Chloride: 91 mmol/L — ABNORMAL LOW (ref 101–111)
Creatinine, Ser: 1.3 mg/dL — ABNORMAL HIGH (ref 0.44–1.00)
GFR calc Af Amer: 45 mL/min — ABNORMAL LOW (ref >60)
GFR calc non Af Amer: 39 mL/min — ABNORMAL LOW (ref >60)
Glucose, Bld: 107 mg/dL — ABNORMAL HIGH (ref 65–99)
Potassium: 4.5 mmol/L (ref 3.5–5.1)
Sodium: 139 mmol/L (ref 135–145)

## 2016-07-08 LAB — PROTIME-INR
INR: 2
Prothrombin Time: 23 seconds — ABNORMAL HIGH (ref 11.4–15.2)

## 2016-07-08 LAB — CULTURE, BAL-QUANTITATIVE: SPECIAL REQUESTS: NORMAL

## 2016-07-08 LAB — CULTURE, BAL-QUANTITATIVE W GRAM STAIN: Culture: 10000 — AB

## 2016-07-08 LAB — CBC
HCT: 29.9 % — ABNORMAL LOW (ref 36.0–46.0)
Hemoglobin: 9.4 g/dL — ABNORMAL LOW (ref 12.0–15.0)
MCH: 30.5 pg (ref 26.0–34.0)
MCHC: 31.4 g/dL (ref 30.0–36.0)
MCV: 97.1 fL (ref 78.0–100.0)
Platelets: 322 10*3/uL (ref 150–400)
RBC: 3.08 MIL/uL — ABNORMAL LOW (ref 3.87–5.11)
RDW: 17.8 % — ABNORMAL HIGH (ref 11.5–15.5)
WBC: 14.1 10*3/uL — ABNORMAL HIGH (ref 4.0–10.5)

## 2016-07-08 MED ORDER — DOPAMINE-DEXTROSE 3.2-5 MG/ML-% IV SOLN
0.0000 ug/kg/min | INTRAVENOUS | Status: DC
  Administered 2016-07-08 – 2016-07-12 (×4): 3 ug/kg/min via INTRAVENOUS
  Administered 2016-07-15: 4 ug/kg/min via INTRAVENOUS
  Administered 2016-07-16: 5 ug/kg/min via INTRAVENOUS
  Filled 2016-07-08 (×7): qty 250

## 2016-07-08 MED ORDER — CHLORHEXIDINE GLUCONATE 0.12% ORAL RINSE (MEDLINE KIT)
15.0000 mL | Freq: Two times a day (BID) | OROMUCOSAL | Status: DC
  Administered 2016-07-08 – 2016-07-13 (×9): 15 mL via OROMUCOSAL

## 2016-07-08 MED ORDER — ORAL CARE MOUTH RINSE
15.0000 mL | OROMUCOSAL | Status: DC
Start: 2016-07-08 — End: 2016-07-13
  Administered 2016-07-08 – 2016-07-13 (×46): 15 mL via OROMUCOSAL

## 2016-07-08 MED ORDER — WARFARIN SODIUM 2 MG PO TABS
2.0000 mg | ORAL_TABLET | Freq: Every day | ORAL | Status: DC
  Administered 2016-07-08: 2 mg
  Filled 2016-07-08: qty 1

## 2016-07-08 MED ORDER — ALBUMIN HUMAN 5 % IV SOLN
12.5000 g | Freq: Once | INTRAVENOUS | Status: AC
  Administered 2016-07-08: 12.5 g via INTRAVENOUS
  Filled 2016-07-08: qty 250

## 2016-07-08 MED ORDER — CALCIUM CHLORIDE 10 % IV SOLN
1.0000 g | Freq: Once | INTRAVENOUS | Status: AC
  Administered 2016-07-08: 1 g via INTRAVENOUS
  Filled 2016-07-08: qty 10

## 2016-07-08 MED ORDER — IOPAMIDOL (ISOVUE-370) INJECTION 76%
INTRAVENOUS | Status: AC
  Administered 2016-07-08: 100 mL
  Filled 2016-07-08: qty 100

## 2016-07-08 MED ORDER — FUROSEMIDE 10 MG/ML IJ SOLN
8.0000 mg/h | INTRAVENOUS | Status: DC
  Administered 2016-07-08 – 2016-07-13 (×5): 8 mg/h via INTRAVENOUS
  Filled 2016-07-08 (×9): qty 25

## 2016-07-08 MED ORDER — LEVOTHYROXINE SODIUM 100 MCG IV SOLR
25.0000 ug | Freq: Every day | INTRAVENOUS | Status: DC
  Administered 2016-07-08 – 2016-07-13 (×6): 25 ug via INTRAVENOUS
  Filled 2016-07-08 (×6): qty 5

## 2016-07-08 MED ORDER — PHENYLEPHRINE HCL 10 MG/ML IJ SOLN
0.0000 ug/min | INTRAVENOUS | Status: DC
  Administered 2016-07-08 – 2016-07-13 (×14): 80 ug/min via INTRAVENOUS
  Administered 2016-07-14: 50 ug/min via INTRAVENOUS
  Filled 2016-07-08 (×16): qty 4

## 2016-07-08 NOTE — Progress Notes (Signed)
[address]       [name] [id number]             [phone]        CARDIOTHORACIC SURGERY PROGRESS NOTE   R13 Days Post-Op Procedure(s) (LRB): MITRAL VALVE (MV) REPLACEMENT (N/A) TRICUSPID VALVE REPAIR (N/A) MAZE (N/A) TRANSESOPHAGEAL ECHOCARDIOGRAM (TEE) (N/A)   R1 Day Post-Op Procedure(s) (LRB): VIDEO BRONCHOSCOPY/ INTUBATION/ REMOVAL OF SECRETIONS (N/A)  Subjective: Sedated on vent but follows some commands.  Reportedly airway secretions have decreased overnight when suctioned  Objective: Vital signs: BP Readings from Last 1 Encounters:  07/08/16 (!) 88/36   Pulse Readings from Last 1 Encounters:  07/08/16 67   Resp Readings from Last 1 Encounters:  07/08/16 17   Temp Readings from Last 1 Encounters:  07/08/16 98.3 F (36.8 C) (Oral)    Hemodynamics:    Physical Exam:  Rhythm:   Junctional  Breath sounds: Scattered rhonchi  Heart sounds:  RRR  Incisions:  Clean and dry  Abdomen:  Soft, non-distended, non-tender  Extremities:  Warm, well-perfused, swollen   Intake/Output from previous day: 10/10 0701 - 10/11 0700 In: 3301.6 [I.V.:1781.6; NG/GT:220; IV Piggyback:1300] Out: 1835 [Urine:1830; Blood:5] Intake/Output this shift: Total I/O In: 445.2 [I.V.:445.2] Out: -   Lab Results:  CBC: Recent Labs  07/16/2016 1409 07/08/16 0310  WBC 10.1 14.1*  HGB 9.7* 9.4*  HCT 30.6* 29.9*  PLT 262 322    BMET:  Recent Labs  07/06/2016 1409 07/08/16 0310  NA 139 139  K 2.9* 4.5  CL 89* 91*  CO2 37* 36*  GLUCOSE 122* 107*  BUN 41* 37*  CREATININE 1.20* 1.30*  CALCIUM 8.7* 8.5*     PT/INR:   Recent Labs  07/08/16 0310  LABPROT 23.0*  INR 2.00    CBG (last 3)   Recent Labs  07/06/2016 1648 07/02/2016 2130 07/08/16 0723  GLUCAP 134* 99 146*    ABG    Component Value Date/Time   PHART 7.384 07/17/2016 1430   PCO2ART 62.7 (H) 07/13/2016 1430   PO2ART 82.3 (L) 07/18/2016 1430   HCO3 36.7 (H) 07/16/2016 1430   TCO2 34 07/02/2016 1749   ACIDBASEDEF 3.0 (H) 06/26/2016 0859   O2SAT 95.3 07/22/2016 1430    CXR: PORTABLE CHEST 1 VIEW  COMPARISON:  July 07, 2016  FINDINGS: Endotracheal tube tip is 4.3 cm above the carina. Nasogastric tube tip and side port are below the diaphragm. Central catheter tip is in the superior vena cava. No pneumothorax. There has been partial clearing on the left with left pleural effusions smaller compared to 1 day prior. There is now partial aeration of the left upper lobe. There remains sizable effusions bilaterally with atelectatic change and consolidation in both lower lung zone regions, more on the left than on the right. There is stable cardiomegaly with pulmonary venous hypertension. There is evidence of a previous mitral valve replacement with left atrial appendage clamp present. No adenopathy evident.  IMPRESSION: Tube and catheter positions as described without pneumothorax. Left effusion somewhat smaller but still sizable. Evidence of congestive heart failure remains with atelectasis/ consolidation in the lower lobes bilaterally, more on the left than on the right, stable. No new opacity evident.   Electronically Signed   By: Bretta BangWilliam  Woodruff III M.D.   On: 07/08/2016 09:12  Assessment/Plan: S/P Procedure(s) (LRB): VIDEO BRONCHOSCOPY/ INTUBATION/ REMOVAL OF SECRETIONS (N/A)  Now POD13 from original surgery Markedly improved aeration of left lung on this morning's  x-ray, although there remains significant bibasilar atelectasis L>R and possible effusions Acute on chronic respiratory failure, improved on vent HCAP -  culture results from FOB performed 10/7 reported "normal flora" repeat BAL from 10/9 still pending - day #4 Fortaz day #2 Vanc Maintaining junctional rhythm w/ stable HR and BP although now on neo and dopamine for BP support due to hypotension associated with intravenous sedation Permanent atrial fibrillation now s/p maze  procedure - HR stable and well controlled Acute on chronic diastolic CHF with expected post-op volume excess, weight reportedly up 2 lbs and I/O's positive 1.5 liters yesterday - lasix currently on hold due to hypotension Hypokalemia - diuretic induced - improved Expected post op acute blood loss anemia, Hgb stable 9.4 this morning Longstanding rheumatic heart disease  Pulmonary hypertension Chronic kidney disease stage III, creatinine stable Morbid obesity Physical deconditioning - severe Protein-depleted malnutrition Chronic venous insufficiency   Will check CTA chest to r/o PE and f/u size of pleural effusions  Consider acute vent wean and extubation tomorrow if airway secretions decreased  Continue Vanc + Fortaz and f/u cultures  Restart lasix drip at reduced dose and try to keep I/O's negative  Restart Coumadin at reduced dose  Will need enteral tube feeds started if unable to extubate and resume oral diet   Purcell Nails, MD 07/08/2016 10:00 AM

## 2016-07-08 NOTE — Progress Notes (Signed)
RT assisted in pt transport on vent to CT. Pt placed on 100$ FIO2 during the trip. Vitals remained stable throughout. Upon return to 2S01, pt FIO2 returned to 50%.

## 2016-07-08 NOTE — Progress Notes (Signed)
Nutrition Follow Up  DOCUMENTATION CODES:   Morbid obesity  INTERVENTION:    If TF started, recommend initiating Vital High Protein at goal rate of 30 ml/h (720 ml per day) and Prostat 60 ml BID  Above TF regimen + current Propofol infusion to provide 1553 kcals, 123 gm protein, 602 ml of free water  NEW NUTRITION DIAGNOSIS:   Inadequate oral intake related to inability to eat as evidenced by NPO status, ongoing  GOAL:   Provide needs based on ASPEN/SCCM guidelines, currently unmet  MONITOR:   Vent status, Labs, Weight trends, Skin, I & O's, TF initiation   ASSESSMENT:   75 yo Female admitted 06/26/2016 due to 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  Patient s/p procedures 10/10: VIDEO BRONCHOSCOPY INTUBATION REMOVAL OF SECRETIONS   Patient is currently intubated on ventilator support >> NGT in place Temp (24hrs), Avg:98.4 F (36.9 C), Min:97.6 F (36.4 C), Max:98.8 F (37.1 C)  Propofol: 16.4 ml/hr >>> 433 fat kcals   Pt with atelectasis/collapse with complete opacification L hemithorax. Post-op volume excess >> Lasix on hold due to hypotension. Plan is for weaning trials in AM per CCM. Labs and medications reviewed.   Diet Order:  Diet NPO time specified  Skin:  Reviewed, no issues   CBG (last 3)   Recent Labs  Mar 02, 2016 2130 07/08/16 0723 07/08/16 1225  GLUCAP 99 146* 115*   Last BM:  10/10  Height:   Ht Readings from Last 1 Encounters:  07/03/16 5\' 1"  (1.549 m)    Weight:   Wt Readings from Last 1 Encounters:  07/08/16 242 lb 15.2 oz (110.2 kg)    Ideal Body Weight:  45.4 kg  BMI:  Body mass index is 45.9 kg/m.  Re-estimated Nutritional Needs:   Kcal:  1210-1540  Protein:  >/= 120 gm  Fluid:  per MD  EDUCATION NEEDS:   No education needs identified at this time  Maureen ChattersKatie Kenyette Gundy, RD, LDN Pager #: [phone] After-Hours Pager #: [phone]

## 2016-07-08 NOTE — Progress Notes (Signed)
      [address]       [place] [id number]             [phone]      Intubated, sedated  BP 125/60   Pulse 67   Temp 98.3 F (36.8 C) (Oral)   Resp 16   Ht 5\' 1"  (1.549 m)   Wt 242 lb 15.2 oz (110.2 kg)   SpO2 97%   BMI 45.90 kg/m    Intake/Output Summary (Last 24 hours) at 07/08/16 1800 Last data filed at 07/08/16 1600  Gross per 24 hour  Intake          4674.87 ml  Output             2470 ml  Net          2204.87 ml   On neo dopamine and lasix drips  Viviann SpareSteven C. Dorris FetchHendrickson, MD [facility] [phone]

## 2016-07-08 NOTE — Clinical Social Work Note (Signed)
CSW continuing to follow for support and discharge needs. Shonny Charlot Gouin, LCSW [phone] 

## 2016-07-08 NOTE — Progress Notes (Signed)
Paged MD Donata ClayVan Trigt on call and made him aware of patient's systolic BP <90, MAP < 60 and patient maxed out on his order of neo per his request during evening rounds. New orders received and administered. Will continue to monitor patient closely.  Horton ChinMacKayla A Noha Karasik, RN

## 2016-07-08 NOTE — Progress Notes (Signed)
Name: Ann Potter MRN: [id number] DOB: 1940-12-09    ADMISSION DATE:  06/11/2016 CONSULTATION DATE:  10/8  REFERRING MD :  Tyrone Sage   CHIEF COMPLAINT:  White-out L hemithorax   BRIEF PATIENT DESCRIPTION: 75yo female with multiple medical problems including AFib, asthma, CAD, CHF, severe mitral stenosis, severe TR admitted 9/28 for MV replacement, tricuspid repair and MAZE.  Post op recovery has been slow and now c/b complete white-out of L hemithorax likely thought r/t mucous plugging.  Initially thought to be pleural effusion and pt was sent to IR for pigtail placement, however CT chest revealed only small amt of effusion and dense atx/collapse.  She was tried on chest PT, mucomyst and FOB by CVTS on 10/7 without significant improvement.  PCCM consulted 10/8 to assist.   SIGNIFICANT EVENTS  9/28 MV replacement, Tricuspid repair, MAZE  10/7 FOB  10/9 Repeat FOB 10/10 Repeat FOB in OR  STUDIES:  CT chest 10/6>>> Dense atelectasis and collapse of the left upper lobe and lingula. There also is atelectasis of the left lower lobe with mucous plugging in the lower lobe bronchi. There is only a small partially loculated left pleural effusion and also a small right pleural effusion. Decision was made not to place a percutaneous chest tube CXR 10/9 > persistent collapse of lt lung   HISTORY OF PRESENT ILLNESS:  75yo female with multiple medical problems including AFib, asthma, CAD, CHF, severe mitral stenosis, severe TR admitted 9/28 for MV replacement, tricuspid repair and MAZE.  Post op recovery has been slow and now c/b complete white-out of L hemithorax likely thought r/t mucous plugging.  Initially thought to be pleural effusion and pt was sent to IR for pigtail placement, however CT chest revealed only small amt of effusion and dense atx/collapse.  She was tried on chest PT, mucomyst and FOB by CVTS on 10/7 without significant improvement.  PCCM consulted 10/8 to assist.   PAST  MEDICAL HISTORY :   has a past medical history of Anxiety; Arthritis; Asthma; Atrial fibrillation (HCC); Atrial fibrillation, chronic (HCC) (01/25/2012); CAD (coronary artery disease); CHF (congestive heart failure) (HCC); Chronic diastolic congestive heart failure (HCC) (01/25/2012); Chronic lower back pain; Chronic renal insufficiency, stage III (moderate) (01/25/2012); Chronic venous insufficiency; COPD (chronic obstructive pulmonary disease) (HCC); Heart murmur; HLD (hyperlipidemia); HTN (hypertension); Hypothyroidism; Lower extremity edema; Macular degeneration; Mitral stenosis; Morbid obesity (HCC); Myocardial infarction (2007); Pulmonary hypertension; Rheumatic fever during childhood; Rheumatic heart disease; S/P Maze operation for atrial fibrillation (06/17/2016); 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  has a past surgical history that includes Multiple tooth extractions (1965); Tubal ligation (1984); TEE with cardioversion; TEE without cardioversion (N/A, 06/25/2015); Cardiac catheterization (12/2011; 04/16/2016); Cataract extraction w/ intraocular lens  implant, bilateral (Bilateral); Cardiac catheterization (N/A, 04/16/2016); Cardioversion; Mitral valve replacement (N/A, 06/21/2016); Tricuspid valve replacement (N/A, 06/23/2016); MAZE (N/A, 06/24/2016); and TEE without cardioversion (N/A, 06/05/2016). Prior to Admission medications   Medication Sig Start Date End Date Taking? Authorizing Provider  acetaminophen (TYLENOL) 650 MG CR tablet Take 650 mg by mouth every 8 (eight) hours as needed for pain.   Yes Historical Provider, MD  albuterol (PROVENTIL HFA;VENTOLIN HFA) 108 (90 BASE) MCG/ACT inhaler Inhale 2 puffs into the lungs every 6 (six) hours as needed. For shortness of breath   Yes Historical Provider, MD  diltiazem (CARDIZEM) 30 MG tablet Take 1 tablet (30 mg total) by mouth 2 (two) times daily.  07/03/15  Yes Christiane Ha  Margy Clarks, MD  Fluticasone-Salmeterol (ADVAIR) 250-50 MCG/DOSE AEPB Inhale 1 puff into the lungs 2 (two) times daily.   Yes Historical Provider, MD  levothyroxine (SYNTHROID, LEVOTHROID) 50 MCG tablet Take 50 mcg by mouth daily.   Yes Historical Provider, MD  loratadine (CLARITIN) 10 MG tablet Take 10 mg by mouth as needed for allergies.    Yes Historical Provider, MD  metolazone (ZAROXOLYN) 2.5 MG tablet Take 1 tab every other week;may take weekly for additional swelling as needed. Patient taking differently: Take 2.5 mg by mouth See admin instructions. Take 2.5 mg by mouth every other week;may take weekly for additional swelling as needed. 05/21/16  Yes Antoine Poche, MD  montelukast (SINGULAIR) 10 MG tablet Take 10 mg by mouth daily.    Yes Historical Provider, MD  potassium chloride SA (K-DUR,KLOR-CON) 20 MEQ tablet Take 2 tablets (40 mEq total) by mouth 2 (two) times daily. 04/17/16  Yes Arty Baumgartner, NP  rivaroxaban (XARELTO) 20 MG TABS tablet Take 1 tablet (20 mg total) by mouth daily with supper. 10/01/15  Yes Antoine Poche, MD  rosuvastatin (CRESTOR) 10 MG tablet Take 10 mg by mouth daily.   Yes Historical Provider, MD  torsemide (DEMADEX) 20 MG tablet Take 4 tablets (80 mg total) by mouth 2 (two) times daily. 04/08/16  Yes Antoine Poche, MD   Allergies  Allergen Reactions  . Relafen [Nabumetone] Other (See Comments)    Bladder pain    FAMILY HISTORY:  family history includes Cancer in her mother and sister; Deep vein thrombosis in her father; Heart disease (age of onset: 56) in her father; Stomach cancer in her brother. SOCIAL HISTORY:  reports that she has quit smoking. Her smoking use included Cigarettes. She started smoking about 56 years ago. She has a 11.00 pack-year smoking history. She has never used smokeless tobacco. She reports that she does not drink alcohol or use drugs.  REVIEW OF SYSTEMS:   As per HPI - All other systems reviewed and  were neg.    SUBJECTIVE:   VITAL SIGNS: Temp:  [97.6 F (36.4 C)-98.8 F (37.1 C)] 98.3 F (36.8 C) (10/11 0727) Pulse Rate:  [53-77] 67 (10/11 0900) Resp:  [13-26] 17 (10/11 0900) BP: (81-142)/(34-76) 88/36 (10/11 0900) SpO2:  [91 %-100 %] 93 % (10/11 0900) FiO2 (%):  [50 %-60 %] 50 % (10/11 0724) Weight:  [242 lb 15.2 oz (110.2 kg)] 242 lb 15.2 oz (110.2 kg) (10/11 0313)  PHYSICAL EXAMINATION: General:  Obese female, No distress Neuro:  Sedated, intubated no focal deficits HEENT:  Mm moist, no JVD  Cardiovascular:  S1S2, RRR, No MRG Lungs:  Scattered crackles, Diminished breath sounds on left.  Abdomen:  Round, soft, non tender  Musculoskeletal:  Warm and dry, 2-3+ BLE edema    Recent Labs Lab 07-20-2016 0410 Jul 20, 2016 1409 07/08/16 0310  NA 139 139 139  K 3.0* 2.9* 4.5  CL 90* 89* 91*  CO2 36* 37* 36*  BUN 43* 41* 37*  CREATININE 1.17* 1.20* 1.30*  GLUCOSE 143* 122* 107*    Recent Labs Lab 07/20/2016 0410 July 20, 2016 1409 07/08/16 0310  HGB 9.3* 9.7* 9.4*  HCT 29.9* 30.6* 29.9*  WBC 9.4 10.1 14.1*  PLT 257 262 322   Dg Chest Port 1 View  Result Date: 07/08/2016 CLINICAL DATA:  Hypoxia EXAM: PORTABLE CHEST 1 VIEW COMPARISON:  07/20/2016 FINDINGS: Endotracheal tube tip is 4.3 cm above the carina. Nasogastric tube tip and side port are below  the diaphragm. Central catheter tip is in the superior vena cava. No pneumothorax. There has been partial clearing on the left with left pleural effusions smaller compared to 1 day prior. There is now partial aeration of the left upper lobe. There remains sizable effusions bilaterally with atelectatic change and consolidation in both lower lung zone regions, more on the left than on the right. There is stable cardiomegaly with pulmonary venous hypertension. There is evidence of a previous mitral valve replacement with left atrial appendage clamp present. No adenopathy evident. IMPRESSION: Tube and catheter positions as  described without pneumothorax. Left effusion somewhat smaller but still sizable. Evidence of congestive heart failure remains with atelectasis/ consolidation in the lower lobes bilaterally, more on the left than on the right, stable. No new opacity evident. Electronically Signed   By: Bretta BangWilliam  Woodruff III M.D.   On: 07/08/2016 09:12   Dg Chest Port 1 View  Result Date: 2015-11-10 CLINICAL DATA:  Endotracheal tube placed after bronchoscopy today. EXAM: PORTABLE CHEST 1 VIEW COMPARISON:  Portable chest x-ray of today's date at 5:34 a.m. FINDINGS: Near-total opacification of the left hemi thorax persists. The right lung is reasonably well inflated. The interstitial markings are less conspicuous since intubation. There is no significant mediastinal shift. The endotracheal tube tip lies 3.8 cm above the carina. The esophagogastric tube tip projects below the inferior margin of the image. The cardiac silhouette is largely obscured. A prosthetic mitral as well as aortic valve ring are observed. There is a left atrial appendage clip. There is a PICC line in place whose tip projects over the junction of the proximal and midportions of the SVC. IMPRESSION: Interval intubation of the trachea with appropriate positioning of the endotracheal tube tip. Improved aeration of the right lung. Persistent coarse right basilar lung markings with small right pleural effusion. Stable near-total opacification of the left hemi thorax. Electronically Signed   By: David  SwazilandJordan M.D.   On: 2015-11-10 15:09   Dg Chest Port 1 View  Result Date: 2015-11-10 CLINICAL DATA:  75 year old female with shortness of Breath. Respiratory failure. Recent bronchoscopy for left lung collapse. Initial encounter. EXAM: PORTABLE CHEST 1 VIEW COMPARISON:  07/06/2016 and earlier. FINDINGS: Portable AP semi upright view at at 0534 hours. Unchanged opacification of the left hemi thorax with obscuration of the left mediastinal contours. Sequelae of  cardiac surgery. Right PICC line is stable. Stable ventilation in the right lung with veiling right lung base opacity. No pneumothorax. IMPRESSION: Stable chest with opacified left hemi thorax and veiling opacity at the right lung base compatible with right pleural effusion. Electronically Signed   By: Odessa FlemingH  Hall M.D.   On: 2015-11-10 08:09   Dg Chest Port 1 View  Result Date: 07/06/2016 CLINICAL DATA:  Bronchoscopy for left lung collapse. EXAM: PORTABLE CHEST 1 VIEW COMPARISON:  Earlier today FINDINGS: Persistent white out of the left chest. Unchanged patchy airspace opacity at the right base with small pleural effusion. Cardiomegaly with mitral and tricuspid valve repair. Left atrial clip. No change from prior to suggest postoperative complication. No pneumothorax. IMPRESSION: 1. Stable exam after bronchoscopy. 2. Persistent white out of the left chest. 3. Right lower lobe airspace disease and small pleural effusion. Electronically Signed   By: Marnee SpringJonathon  Watts M.D.   On: 07/06/2016 12:42    ASSESSMENT / PLAN:  Atelectasis/collapse with complete opacification L hemithorax. No significant improvement with chest PT, mucomyst, FOB x 3 B/L pleural effusion   Reccs: Lt lung better aerated on today  CXR PT due for CT today. We will review images after it is done Likely keep intubated today. Plan for weaning trials in AM  PCCM will continue to follow  Critical care time- 35 mins.  Chilton Greathouse MD [facility] Pager [phone] If no answer or after 3pm call: [phone] 07/08/2016, 9:38 AM

## 2016-07-09 ENCOUNTER — Encounter (HOSPITAL_COMMUNITY)
Admission: RE | Disposition: E | Payer: Self-pay | Source: Ambulatory Visit | Attending: Thoracic Surgery (Cardiothoracic Vascular Surgery)

## 2016-07-09 ENCOUNTER — Inpatient Hospital Stay (HOSPITAL_COMMUNITY): Payer: Medicare Other | Admitting: Certified Registered Nurse Anesthetist

## 2016-07-09 ENCOUNTER — Inpatient Hospital Stay (HOSPITAL_COMMUNITY): Payer: Medicare Other

## 2016-07-09 DIAGNOSIS — J90 Pleural effusion, not elsewhere classified: Secondary | ICD-10-CM

## 2016-07-09 HISTORY — PX: VIDEO BRONCHOSCOPY: SHX5072

## 2016-07-09 HISTORY — PX: CHEST TUBE INSERTION: SHX231

## 2016-07-09 LAB — GLUCOSE, CAPILLARY
GLUCOSE-CAPILLARY: 98 mg/dL (ref 65–99)
Glucose-Capillary: 128 mg/dL — ABNORMAL HIGH (ref 65–99)
Glucose-Capillary: 120 mg/dL — ABNORMAL HIGH (ref 65–99)

## 2016-07-09 LAB — CULTURE, RESPIRATORY W GRAM STAIN: Culture: NORMAL

## 2016-07-09 LAB — BASIC METABOLIC PANEL
ANION GAP: 10 (ref 5–15)
BUN: 32 mg/dL — ABNORMAL HIGH (ref 6–20)
CO2: 36 mmol/L — AB (ref 22–32)
Calcium: 8.6 mg/dL — ABNORMAL LOW (ref 8.9–10.3)
Chloride: 91 mmol/L — ABNORMAL LOW (ref 101–111)
Creatinine, Ser: 1.14 mg/dL — ABNORMAL HIGH (ref 0.44–1.00)
GFR calc Af Amer: 53 mL/min — ABNORMAL LOW (ref >60)
GFR calc non Af Amer: 46 mL/min — ABNORMAL LOW (ref >60)
GLUCOSE: 117 mg/dL — AB (ref 65–99)
POTASSIUM: 3 mmol/L — AB (ref 3.5–5.1)
Sodium: 137 mmol/L (ref 135–145)

## 2016-07-09 LAB — CBC
HEMATOCRIT: 26.7 % — AB (ref 36.0–46.0)
HEMOGLOBIN: 8.5 g/dL — AB (ref 12.0–15.0)
MCH: 30.5 pg (ref 26.0–34.0)
MCHC: 31.8 g/dL (ref 30.0–36.0)
MCV: 95.7 fL (ref 78.0–100.0)
Platelets: 332 10*3/uL (ref 150–400)
RBC: 2.79 MIL/uL — ABNORMAL LOW (ref 3.87–5.11)
RDW: 17 % — ABNORMAL HIGH (ref 11.5–15.5)
WBC: 15.3 10*3/uL — ABNORMAL HIGH (ref 4.0–10.5)

## 2016-07-09 LAB — PREPARE RBC (CROSSMATCH)

## 2016-07-09 LAB — PROTIME-INR
INR: 1.85
Prothrombin Time: 21.6 seconds — ABNORMAL HIGH (ref 11.4–15.2)

## 2016-07-09 SURGERY — INSERTION, PLEURAL DRAINAGE CATHETER
Anesthesia: General | Site: Chest

## 2016-07-09 MED ORDER — POTASSIUM CHLORIDE 10 MEQ/50ML IV SOLN
10.0000 meq | INTRAVENOUS | Status: AC
  Administered 2016-07-09 (×5): 10 meq via INTRAVENOUS
  Filled 2016-07-09 (×5): qty 50

## 2016-07-09 MED ORDER — MIDAZOLAM HCL 2 MG/2ML IJ SOLN
INTRAMUSCULAR | Status: AC
  Filled 2016-07-09: qty 2

## 2016-07-09 MED ORDER — LIDOCAINE HCL 1 % IJ SOLN
INTRAMUSCULAR | Status: DC | PRN
  Administered 2016-07-09: 30 mL

## 2016-07-09 MED ORDER — FENTANYL CITRATE (PF) 100 MCG/2ML IJ SOLN
INTRAMUSCULAR | Status: DC | PRN
Start: 2016-07-09 — End: 2016-07-09
  Administered 2016-07-09: 100 ug via INTRAVENOUS
  Administered 2016-07-09 (×3): 50 ug via INTRAVENOUS

## 2016-07-09 MED ORDER — ROCURONIUM BROMIDE 100 MG/10ML IV SOLN
INTRAVENOUS | Status: DC | PRN
  Administered 2016-07-09: 60 mg via INTRAVENOUS

## 2016-07-09 MED ORDER — LACTATED RINGERS IV SOLN
INTRAVENOUS | Status: DC | PRN
  Administered 2016-07-09: 14:00:00 via INTRAVENOUS

## 2016-07-09 MED ORDER — SUCCINYLCHOLINE CHLORIDE 200 MG/10ML IV SOSY
PREFILLED_SYRINGE | INTRAVENOUS | Status: AC
  Filled 2016-07-09: qty 10

## 2016-07-09 MED ORDER — POTASSIUM CHLORIDE 10 MEQ/50ML IV SOLN
10.0000 meq | INTRAVENOUS | Status: AC
  Administered 2016-07-09 (×3): 10 meq via INTRAVENOUS
  Filled 2016-07-09 (×3): qty 50

## 2016-07-09 MED ORDER — FENTANYL CITRATE (PF) 250 MCG/5ML IJ SOLN
INTRAMUSCULAR | Status: AC
  Filled 2016-07-09: qty 5

## 2016-07-09 MED ORDER — PROPOFOL 10 MG/ML IV BOLUS
INTRAVENOUS | Status: DC | PRN
Start: 2016-07-09 — End: 2016-07-09
  Administered 2016-07-09: 100 mg via INTRAVENOUS

## 2016-07-09 MED ORDER — POTASSIUM CHLORIDE 10 MEQ/50ML IV SOLN: 10.0000 meq | INTRAVENOUS | Status: DC

## 2016-07-09 MED ORDER — PHENYLEPHRINE 40 MCG/ML (10ML) SYRINGE FOR IV PUSH (FOR BLOOD PRESSURE SUPPORT)
PREFILLED_SYRINGE | INTRAVENOUS | Status: AC
  Filled 2016-07-09: qty 10

## 2016-07-09 MED ORDER — EPHEDRINE 5 MG/ML INJ
INTRAVENOUS | Status: AC
  Filled 2016-07-09: qty 10

## 2016-07-09 MED ORDER — WARFARIN SODIUM 2.5 MG PO TABS
2.5000 mg | ORAL_TABLET | Freq: Every day | ORAL | Status: DC
  Administered 2016-07-09 – 2016-07-10 (×2): 2.5 mg
  Filled 2016-07-09 (×2): qty 1

## 2016-07-09 MED ORDER — MIDAZOLAM HCL 5 MG/5ML IJ SOLN
INTRAMUSCULAR | Status: DC | PRN
  Administered 2016-07-09: 2 mg via INTRAVENOUS

## 2016-07-09 MED ORDER — SODIUM CHLORIDE 0.9 % IV SOLN
Freq: Once | INTRAVENOUS | Status: AC
  Administered 2016-07-09: 11:00:00 via INTRAVENOUS

## 2016-07-09 MED ORDER — 0.9 % SODIUM CHLORIDE (POUR BTL) OPTIME
TOPICAL | Status: DC | PRN
  Administered 2016-07-09: 1000 mL

## 2016-07-09 MED ORDER — EPHEDRINE SULFATE 50 MG/ML IJ SOLN
INTRAMUSCULAR | Status: DC | PRN
  Administered 2016-07-09: 20 mg via INTRAVENOUS

## 2016-07-09 SURGICAL SUPPLY — 36 items
BRUSH SCRUB EZ PLAIN DRY (MISCELLANEOUS) IMPLANT
CANISTER SUCTION 2500CC (MISCELLANEOUS) ×4 IMPLANT
CONT SPEC 4OZ CLIKSEAL STRL BL (MISCELLANEOUS) ×4 IMPLANT
COVER SURGICAL LIGHT HANDLE (MISCELLANEOUS) ×4 IMPLANT
DERMABOND ADVANCED (GAUZE/BANDAGES/DRESSINGS) ×4
DERMABOND ADVANCED .7 DNX12 (GAUZE/BANDAGES/DRESSINGS) ×4 IMPLANT
DRAPE C-ARM 42X72 X-RAY (DRAPES) ×4 IMPLANT
DRAPE HALF SHEET 40X57 (DRAPES) ×4 IMPLANT
DRAPE LAPAROSCOPIC ABDOMINAL (DRAPES) ×8 IMPLANT
DRSG COVADERM 4X6 (GAUZE/BANDAGES/DRESSINGS) ×4 IMPLANT
GLOVE BIO SURGEON STRL SZ 6.5 (GLOVE) ×12 IMPLANT
GLOVE BIO SURGEONS STRL SZ 6.5 (GLOVE) ×4
GLOVE ORTHO TXT STRL SZ7.5 (GLOVE) ×12 IMPLANT
GOWN STRL REUS W/ TWL LRG LVL3 (GOWN DISPOSABLE) ×6 IMPLANT
GOWN STRL REUS W/TWL LRG LVL3 (GOWN DISPOSABLE) ×6
KIT BASIN OR (CUSTOM PROCEDURE TRAY) ×4 IMPLANT
KIT PLEURX DRAIN CATH 1000ML (MISCELLANEOUS) ×8 IMPLANT
KIT PLEURX DRAIN CATH 15.5FR (DRAIN) ×8 IMPLANT
KIT ROOM TURNOVER OR (KITS) ×4 IMPLANT
NS IRRIG 1000ML POUR BTL (IV SOLUTION) ×8 IMPLANT
OIL SILICONE PENTAX (PARTS (SERVICE/REPAIRS)) ×4 IMPLANT
PACK GENERAL/GYN (CUSTOM PROCEDURE TRAY) ×4 IMPLANT
PAD ARMBOARD 7.5X6 YLW CONV (MISCELLANEOUS) ×8 IMPLANT
SET DRAINAGE LINE (MISCELLANEOUS) IMPLANT
SPONGE GAUZE 4X4 12PLY STER LF (GAUZE/BANDAGES/DRESSINGS) ×4 IMPLANT
SUT ETHILON 3 0 FSL (SUTURE) ×8 IMPLANT
SUT VIC AB 3-0 SH 8-18 (SUTURE) ×4 IMPLANT
SUT VIC AB 3-0 X1 27 (SUTURE) ×8 IMPLANT
SYR 20ML ECCENTRIC (SYRINGE) ×4 IMPLANT
TOWEL OR 17X24 6PK STRL BLUE (TOWEL DISPOSABLE) ×4 IMPLANT
TOWEL OR 17X26 10 PK STRL BLUE (TOWEL DISPOSABLE) ×4 IMPLANT
TRAP SPECIMEN MUCOUS 40CC (MISCELLANEOUS) ×4 IMPLANT
TUBE CONNECTING 20'X1/4 (TUBING) ×1
TUBE CONNECTING 20X1/4 (TUBING) ×3 IMPLANT
VALVE REPLACEMENT CAP (MISCELLANEOUS) IMPLANT
WATER STERILE IRR 1000ML POUR (IV SOLUTION) ×4 IMPLANT

## 2016-07-09 NOTE — Progress Notes (Signed)
Patient ID: Ann PoeGloria Woolston, female   DOB: [birth year], 75 y.o.   MRN: [id number]  SICU Evening Rounds:  Hemodynamically stable in sinus rhythm.  Remains on ventilator and not awake yet postop.  Had bilateral pleurX catheters today and bronch. CXR pending  Transfusing for Hgb 8.5 this am.

## 2016-07-09 NOTE — Transfer of Care (Signed)
Immediate Anesthesia Transfer of Care Note  Patient: Ann Potter  Procedure(s) Performed: Procedure(s): INSERTION PLEURAL DRAINAGE CATHETER (Bilateral) VIDEO BRONCHOSCOPY (N/A)  Patient Location: SICU  Anesthesia Type:General  Level of Consciousness: Patient remains intubated per anesthesia plan  Airway & Oxygen Therapy: Patient remains intubated per anesthesia plan and Patient placed on Ventilator (see vital sign flow sheet for setting)  Post-op Assessment: Report given to RN and Post -op Vital signs reviewed and stable  Post vital signs: Reviewed and stable  Last Vitals:  Vitals:   07/07/2016 1315 07/08/2016 1330  BP:  (!) 101/47  Pulse: 61 61  Resp: 18 19  Temp:      Last Pain:  Vitals:   07/21/2016 1239  TempSrc: Oral  PainSc:       Patients Stated Pain Goal: 0 (07/06/16 1800)  Complications: No apparent anesthesia complications

## 2016-07-09 NOTE — Brief Op Note (Signed)
06/04/2016 - 07/19/2016  3:38 PM  PATIENT:  Ann Potter  75 y.o. female  PRE-OPERATIVE DIAGNOSIS:  atelectasis and bilateral pleural effusions  POST-OPERATIVE DIAGNOSIS:  atelectasis and bilateral pleural effusions  PROCEDURE:  Procedure(s): INSERTION PLEURAL DRAINAGE CATHETER (Bilateral) VIDEO BRONCHOSCOPY (N/A)  SURGEON:  Surgeon(s) and Role:    * Purcell Nailslarence H Owen, MD - Primary  EBL:  Total I/O In: 1213 [I.V.:1133; Blood:30; IV Piggyback:50] Out: 1150 [Urine:1150]  BLOOD ADMINISTERED:none  DRAINS: Pleur-X Chest Tube(s) in the left and right pleural spaces   LOCAL MEDICATIONS USED:  LIDOCAINE   SPECIMEN:  Source of Specimen:  BAL  DISPOSITION OF SPECIMEN:  Lab for culture  COUNTS:  YES  TOURNIQUET:  * No tourniquets in log *  DICTATION: .Note written in EPIC  PLAN OF CARE: Admit to inpatient   PATIENT DISPOSITION:  ICU - intubated and hemodynamically stable.   Delay start of Pharmacological VTE agent (>24hrs) due to surgical blood loss or risk of bleeding: not applicable  Purcell Nailslarence H Owen, MD 07/06/2016 3:39 PM

## 2016-07-09 NOTE — Op Note (Signed)
NAMEvon Slack:  Arnold, Pietra            ACCOUNT NO.:  [id number]  MEDICAL RECORD NO.:  [id number]  LOCATION:  XRAY                         FACILITY:  [facility]  PHYSICIAN:  Sheliah PlaneEdward Tashia Leiterman, MD    DATE OF BIRTH:  1941-03-20  DATE OF PROCEDURE:  07/23/2016 DATE OF DISCHARGE:                              OPERATIVE REPORT   PREOPERATIVE DIAGNOSIS:  Collapsed left lung.  POSTOPERATIVE DIAGNOSIS:  Collapsed left lung.  SURGICAL PROCEDURE:  Intubation and bronchoscopy under general anesthesia.  SURGEON:  Sheliah PlaneEdward Melani Brisbane, MD.  BRIEF HISTORY:  The patient is a 75 year old female who on September 28th underwent mitral valve and tricuspid valve surgery.  In the last several days, her postop chest x-ray showed increasing opacity of the left chest.  A CT scan was performed, which showed this was primarily lung collapse rather than a large effusion.  In spite of aggressive pulmonary toilet, ET suctioning and bedside bronchoscopy x2, the patient persisted with collapse of the left lung and in consultation with the patient and her son and Dr. Burundiman who had primarily been caring for the patient, we decided to proceed with intubation of the patient electively in the operating room and bronchoscopy under general anesthesia.  The patient and her son agreed and signed informed consent.  DESCRIPTION OF PROCEDURE:  The patient was brought to the operating room in a fasting state and underwent general endotracheal anesthesia with an 8.5 French endotracheal tube.  Appropriate time-out was performed and then we proceeded with bronchoscopy.  Both the left and right tracheobronchial trees were carefully examined.  There were no definite obstructing endobronchial lesions appreciated.  The patient did have watery secretions evident in both right and left tracheobronchial tree. There was no definite mucous plugging evident in the left tracheobronchial tree.  In the distal left main bronchus, the bronchial lining was  somewhat excoriated likely from endotracheal suctioning.  The left lung was of lavaged and suctioned well.  BAL of the upper lobe was performed.  Further cultures and cytologies were sent.  It was decided preoperatively the patient would be left intubated.  At the completion of the procedure, the standard endotracheal tube was transitioned to a subglottic tube and the patient returned back to the Surgical Intensive Care Unit in stable condition on the ventilator.     Sheliah PlaneEdward Chelcea Zahn, MD     EG/MEDQ  D:  07/08/2016  T:  02/27/2016  Job:  [id number]

## 2016-07-09 NOTE — Progress Notes (Signed)
Name: Ann Potter MRN: [id number] DOB: August 30, 1941    ADMISSION DATE:  06/11/2016 CONSULTATION DATE:  10/8  REFERRING MD :  Tyrone Sage   CHIEF COMPLAINT:  White-out L hemithorax   BRIEF PATIENT DESCRIPTION: 75yo female with multiple medical problems including AFib, asthma, CAD, CHF, severe mitral stenosis, severe TR admitted 9/28 for MV replacement, tricuspid repair and MAZE.  Post op recovery has been slow and now c/b complete white-out of L hemithorax likely thought r/t mucous plugging.  Initially thought to be pleural effusion and pt was sent to IR for pigtail placement, however CT chest revealed only small amt of effusion and dense atx/collapse.  She was tried on chest PT, mucomyst and FOB by CVTS on 10/7 without significant improvement.  PCCM consulted 10/8 to assist.   SIGNIFICANT EVENTS  9/28 MV replacement, Tricuspid repair, MAZE  10/7 FOB  10/9 Repeat FOB 10/10 Repeat FOB in OR  STUDIES:  CT chest 10/6>>> Dense atelectasis and collapse of the left upper lobe and lingula. There also is atelectasis of the left lower lobe with mucous plugging in the lower lobe bronchi. There is only a small partially loculated left pleural effusion and also a small right pleural effusion. Decision was made not to place a percutaneous chest tube CXR 10/9 > persistent collapse of lt lung   HISTORY OF PRESENT ILLNESS:  75yo female with multiple medical problems including AFib, asthma, CAD, CHF, severe mitral stenosis, severe TR admitted 9/28 for MV replacement, tricuspid repair and MAZE.  Post op recovery has been slow and now c/b complete white-out of L hemithorax likely thought r/t mucous plugging.  Initially thought to be pleural effusion and pt was sent to IR for pigtail placement, however CT chest revealed only small amt of effusion and dense atx/collapse.  She was tried on chest PT, mucomyst and FOB by CVTS on 10/7 without significant improvement.  PCCM consulted 10/8 to assist.   PAST  MEDICAL HISTORY :   has a past medical history of Anxiety; Arthritis; Asthma; Atrial fibrillation (HCC); Atrial fibrillation, chronic (HCC) (01/25/2012); CAD (coronary artery disease); CHF (congestive heart failure) (HCC); Chronic diastolic congestive heart failure (HCC) (01/25/2012); Chronic lower back pain; Chronic renal insufficiency, stage III (moderate) (01/25/2012); Chronic venous insufficiency; COPD (chronic obstructive pulmonary disease) (HCC); Heart murmur; HLD (hyperlipidemia); HTN (hypertension); Hypothyroidism; Lower extremity edema; Macular degeneration; Mitral stenosis; Morbid obesity (HCC); Myocardial infarction (2007); Pulmonary hypertension; Rheumatic fever during childhood; Rheumatic heart disease; S/P Maze operation for atrial fibrillation (06/06/2016); 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  has a past surgical history that includes Multiple tooth extractions (1965); Tubal ligation (1984); TEE with cardioversion; TEE without cardioversion (N/A, 06/25/2015); Cardiac catheterization (12/2011; 04/16/2016); Cataract extraction w/ intraocular lens  implant, bilateral (Bilateral); Cardiac catheterization (N/A, 04/16/2016); Cardioversion; Mitral valve replacement (N/A, 06/02/2016); Tricuspid valve replacement (N/A, 06/20/2016); MAZE (N/A, 06/03/2016); TEE without cardioversion (N/A, 05/29/2016); and Video bronchoscopy (N/A, 08/04/16). Prior to Admission medications   Medication Sig Start Date End Date Taking? Authorizing Provider  acetaminophen (TYLENOL) 650 MG CR tablet Take 650 mg by mouth every 8 (eight) hours as needed for pain.   Yes Historical Provider, MD  albuterol (PROVENTIL HFA;VENTOLIN HFA) 108 (90 BASE) MCG/ACT inhaler Inhale 2 puffs into the lungs every 6 (six) hours as needed. For shortness of breath   Yes Historical Provider, MD  diltiazem (CARDIZEM) 30 MG tablet Take 1 tablet (30 mg  total) by mouth 2 (two) times daily.  07/03/15  Yes Antoine Poche, MD  Fluticasone-Salmeterol (ADVAIR) 250-50 MCG/DOSE AEPB Inhale 1 puff into the lungs 2 (two) times daily.   Yes Historical Provider, MD  levothyroxine (SYNTHROID, LEVOTHROID) 50 MCG tablet Take 50 mcg by mouth daily.   Yes Historical Provider, MD  loratadine (CLARITIN) 10 MG tablet Take 10 mg by mouth as needed for allergies.    Yes Historical Provider, MD  metolazone (ZAROXOLYN) 2.5 MG tablet Take 1 tab every other week;may take weekly for additional swelling as needed. Patient taking differently: Take 2.5 mg by mouth See admin instructions. Take 2.5 mg by mouth every other week;may take weekly for additional swelling as needed. 05/21/16  Yes Antoine Poche, MD  montelukast (SINGULAIR) 10 MG tablet Take 10 mg by mouth daily.    Yes Historical Provider, MD  potassium chloride SA (K-DUR,KLOR-CON) 20 MEQ tablet Take 2 tablets (40 mEq total) by mouth 2 (two) times daily. 04/17/16  Yes Arty Baumgartner, NP  rivaroxaban (XARELTO) 20 MG TABS tablet Take 1 tablet (20 mg total) by mouth daily with supper. 10/01/15  Yes Antoine Poche, MD  rosuvastatin (CRESTOR) 10 MG tablet Take 10 mg by mouth daily.   Yes Historical Provider, MD  torsemide (DEMADEX) 20 MG tablet Take 4 tablets (80 mg total) by mouth 2 (two) times daily. 04/08/16  Yes Antoine Poche, MD   Allergies  Allergen Reactions  . Relafen [Nabumetone] Other (See Comments)    Bladder pain    FAMILY HISTORY:  family history includes Cancer in her mother and sister; Deep vein thrombosis in her father; Heart disease (age of onset: 48) in her father; Stomach cancer in her brother. SOCIAL HISTORY:  reports that she has quit smoking. Her smoking use included Cigarettes. She started smoking about 56 years ago. She has a 11.00 pack-year smoking history. She has never used smokeless tobacco. She reports that she does not drink alcohol or use drugs.  REVIEW OF SYSTEMS:   As per  HPI - All other systems reviewed and were neg.    SUBJECTIVE:   VITAL SIGNS: Temp:  [98.1 F (36.7 C)-99.2 F (37.3 C)] 98.1 F (36.7 C) (10/12 0714) Pulse Rate:  [52-72] 64 (10/12 0830) Resp:  [11-23] 22 (10/12 0830) BP: (53-145)/(12-119) 107/92 (10/12 0830) SpO2:  [88 %-100 %] 95 % (10/12 0830) FiO2 (%):  [50 %] 50 % (10/12 0733) Weight:  [241 lb 13.5 oz (109.7 kg)] 241 lb 13.5 oz (109.7 kg) (10/12 0500)  PHYSICAL EXAMINATION: General:  Obese female, No distress Neuro:  Sedated, intubated no focal deficits HEENT:  Mm moist, no JVD  Cardiovascular:  S1S2, RRR, No MRG Lungs:  Scattered crackles, Diminished breath sounds on left.  Abdomen:  Round, soft, non tender  Musculoskeletal:  Warm and dry, 2-3+ BLE edema    Recent Labs Lab 07/19/16 1409 07/08/16 0310 07/26/2016 0350  NA 139 139 137  K 2.9* 4.5 3.0*  CL 89* 91* 91*  CO2 37* 36* 36*  BUN 41* 37* 32*  CREATININE 1.20* 1.30* 1.14*  GLUCOSE 122* 107* 117*    Recent Labs Lab 07-19-2016 1409 07/08/16 0310 07/14/2016 0350  HGB 9.7* 9.4* 8.5*  HCT 30.6* 29.9* 26.7*  WBC 10.1 14.1* 15.3*  PLT 262 322 332   Ct Angio Chest Pe W Or Wo Contrast  Result Date: 07/08/2016 CLINICAL DATA:  Pneumonia, congestive heart failure. EXAM: CT ANGIOGRAPHY CHEST WITH CONTRAST TECHNIQUE: Multidetector CT imaging of the chest was performed using the standard protocol  during bolus administration of intravenous contrast. Multiplanar CT image reconstructions and MIPs were obtained to evaluate the vascular anatomy. CONTRAST:  74 mL of Isovue 370 intravenously. COMPARISON:  Radiograph of same day.  CT scan of July 03, 2016. FINDINGS: Cardiovascular: Atherosclerosis of thoracic aorta is noted without aneurysm or dissection. There is no definite evidence of pulmonary embolus. Coronary artery calcifications are noted. Status post mitral valve repair. Mediastinum/Nodes: Mildly enlarged adenopathy is noted which is not significantly changed compared  to prior exam and most likely reactive in etiology. Lungs/Pleura: No pneumothorax is noted. Large bilateral pleural effusions are noted with adjacent atelectasis of the upper lobes and complete atelectasis of both lower lobes. Endotracheal and nasogastric tubes are in grossly good position. Upper Abdomen: No significant abnormality seen in the visualized portion of upper abdomen. Musculoskeletal: Multilevel degenerative disc disease is noted in the thoracic spine. Review of the MIP images confirms the above findings. IMPRESSION: Aortic atherosclerosis. No definite evidence of pulmonary embolus. Large bilateral pleural effusions are noted with adjacent atelectasis of the upper lobes and complete atelectasis of both lower lobes. Electronically Signed   By: Lupita Raider, M.D.   On: 07/08/2016 12:04   Dg Chest Port 1 View  Result Date: 01-Aug-2016 CLINICAL DATA:  Hypoxia EXAM: PORTABLE CHEST 1 VIEW COMPARISON:  Chest radiograph and chest CT July 08, 2016 FINDINGS: Endotracheal tube tip is 2.8 cm above the carina. Nasogastric tube tip and side port are below the diaphragm. Central catheter tip is in superior vena cava. No pneumothorax. There are pleural effusions bilaterally, larger on the left than on the right. There is cardiomegaly with pulmonary venous hypertension. Patient is status post mitral valve replacement with left atrial appendage clamp also noted. No adenopathy evident. IMPRESSION: Tube and catheter positions as described without pneumothorax. Sizable pleural effusions bilaterally, left larger than right. Left effusion may be slightly larger compared to 1 day prior. Stable cardiac silhouette. Electronically Signed   By: Bretta Bang III M.D.   On: 2016/08/01 08:19   Dg Chest Port 1 View  Result Date: 07/08/2016 CLINICAL DATA:  Hypoxia EXAM: PORTABLE CHEST 1 VIEW COMPARISON:  July 07, 2016 FINDINGS: Endotracheal tube tip is 4.3 cm above the carina. Nasogastric tube tip and side port  are below the diaphragm. Central catheter tip is in the superior vena cava. No pneumothorax. There has been partial clearing on the left with left pleural effusions smaller compared to 1 day prior. There is now partial aeration of the left upper lobe. There remains sizable effusions bilaterally with atelectatic change and consolidation in both lower lung zone regions, more on the left than on the right. There is stable cardiomegaly with pulmonary venous hypertension. There is evidence of a previous mitral valve replacement with left atrial appendage clamp present. No adenopathy evident. IMPRESSION: Tube and catheter positions as described without pneumothorax. Left effusion somewhat smaller but still sizable. Evidence of congestive heart failure remains with atelectasis/ consolidation in the lower lobes bilaterally, more on the left than on the right, stable. No new opacity evident. Electronically Signed   By: Bretta Bang III M.D.   On: 07/08/2016 09:12   Dg Chest Port 1 View  Result Date: 07/24/2016 CLINICAL DATA:  Endotracheal tube placed after bronchoscopy today. EXAM: PORTABLE CHEST 1 VIEW COMPARISON:  Portable chest x-ray of today's date at 5:34 a.m. FINDINGS: Near-total opacification of the left hemi thorax persists. The right lung is reasonably well inflated. The interstitial markings are less conspicuous since intubation. There  is no significant mediastinal shift. The endotracheal tube tip lies 3.8 cm above the carina. The esophagogastric tube tip projects below the inferior margin of the image. The cardiac silhouette is largely obscured. A prosthetic mitral as well as aortic valve ring are observed. There is a left atrial appendage clip. There is a PICC line in place whose tip projects over the junction of the proximal and midportions of the SVC. IMPRESSION: Interval intubation of the trachea with appropriate positioning of the endotracheal tube tip. Improved aeration of the right lung.  Persistent coarse right basilar lung markings with small right pleural effusion. Stable near-total opacification of the left hemi thorax. Electronically Signed   By: David  SwazilandJordan M.D.   On: 07/18/2016 15:09    ASSESSMENT / PLAN: S/p MV replacement tricuspid repair and MAZE procedure on 9/29 Atelectasis/collapse with complete opacification L hemithorax. No significant improvement with chest PT, mucomyst, FOB x 3 B/L pleural effusion. Worse on CXR today CTA reviewed. There is no PE. B/L effusions with associated lung atelectasis  Reccs: Keep on full vent support.  Plan for b/l pleurex cath, bronch in or today by CVTS Continue vanco, ceftaz for empiric treatment of HCAP Follow bronch cultures.   PCCM will continue to follow  Chilton GreathousePraveen Puja Caffey MD [facility] Pager [phone] If no answer or after 3pm call: [phone] 07/02/2016, 10:09 AM

## 2016-07-09 NOTE — Progress Notes (Signed)
Pharmacy Antibiotic Note  Ann PoeGloria Potter is a 75 y.o. female admitted on 06/05/2016.  Pt is now s/p MVR, tricuspid valve repair, MAZE.  Hospitalization has been complicated by pulmonary process that has limited vent weaning.  OR today for repeat cx and chest tube placement.  Pt continues on Vancomycin and Fortaz.  Plan: Continue Vancomycin 1250 mg IV q24hrs  Will order Vancomycin trough for 10/13.  VT goal 15-20 Fortaz 1gm IV q8hrs per MD - dose ok Follow renal fxn, any new cx data, clinical progress  Height: 5\' 1"  (154.9 cm) Weight: 241 lb 13.5 oz (109.7 kg) IBW/kg (Calculated) : 47.8  Temp (24hrs), Avg:98.6 F (37 C), Min:98.1 F (36.7 C), Max:99.2 F (37.3 C)   Recent Labs Lab 07/06/16 0553 07/28/2016 0410 07/10/2016 1409 07/08/16 0310 07/06/2016 0350  WBC 6.8 9.4 10.1 14.1* 15.3*  CREATININE 1.29* 1.17* 1.20* 1.30* 1.14*    Estimated Creatinine Clearance: 48.1 mL/min (by C-G formula based on SCr of 1.14 mg/dL (H)).    Allergies  Allergen Reactions  . Relafen [Nabumetone] Other (See Comments)    Bladder pain    Antimicrobials this admission: Cefuroxime 9/28>>9/30 Ceftaz 10/6 >> Vanc 9/28 x 2 (OR day). Resume 10/9>>  Dose adjustments this admission: none  Microbiology results: 10/10 acid fast smear (BAL) - negative 10/10 BAL specimen B  - no organisms seen 10/10 BAL specimen C - no organisms seen 10/9 bronchial washings - 10K normal flora 10/9 TA - normal flora 10/6 TA - no organisms seen 9/25 MRSA PCR neg  Thank you for allowing pharmacy to be a part of this patient's care.  Toys 'R' UsKimberly Jaylenne Hamelin, Pharm.D., BCPS Clinical Pharmacist Pager [phone] 07/10/2016 11:32 AM

## 2016-07-09 NOTE — Progress Notes (Signed)
PT Cancellation Note  Patient Details Name: Ann PoeGloria Krabbenhoft MRN: [id number] DOB: [birth year]   Cancelled Treatment:    Reason Eval/Treat Not Completed: Medical issues which prohibited therapy (pt remains on vent with FiO2 of 50% will await medical stability to resume)   Delorse Lekabor, [employer] Demicco Beth 07/03/2016, 7:12 AM Delaney MeigsMaija Tabor Kynsley Whitehouse, PT [phone]

## 2016-07-09 NOTE — Progress Notes (Signed)
RT took pt off the vent for the CRNA to transport the pt to the OR using an Ambu bag.

## 2016-07-09 NOTE — Progress Notes (Signed)
[address]       [name] [id number]             [phone]        CARDIOTHORACIC SURGERY PROGRESS NOTE  R14 Days Post-Op Procedure(s) (LRB): MITRAL VALVE (MV) REPLACEMENT (N/A) TRICUSPID VALVE REPAIR (N/A) MAZE (N/A) TRANSESOPHAGEAL ECHOCARDIOGRAM (TEE) (N/A)   R2 Days Post-Op Procedure(s) (LRB): VIDEO BRONCHOSCOPY/ INTUBATION/ REMOVAL OF SECRETIONS (N/A)  Subjective: Sedated on vent.   By report small to moderate amount white-tan thick airway secretions  Objective: Vital signs: BP Readings from Last 1 Encounters:  07/27/2016 (!) 107/43   Pulse Readings from Last 1 Encounters:  07/24/2016 64   Resp Readings from Last 1 Encounters:  07/16/2016 17   Temp Readings from Last 1 Encounters:  07/20/2016 98.1 F (36.7 C) (Axillary)    Hemodynamics:    Physical Exam:  Rhythm:   AFib w/ controlled rate  Breath sounds: Fairly clear  Heart sounds:  RRR  Incisions:  Clean and dry  Abdomen:  Soft, non-distended  Extremities:  Warm, well-perfused, swollen   Intake/Output from previous day: 10/11 0701 - 10/12 0700 In: 4050.2 [I.V.:3400.2; NG/GT:150; IV Piggyback:500] Out: 3520 [Urine:3420; Emesis/NG output:100] Intake/Output this shift: No intake/output data recorded.  Lab Results:  CBC: Recent Labs  07/08/16 0310 06/29/2016 0350  WBC 14.1* 15.3*  HGB 9.4* 8.5*  HCT 29.9* 26.7*  PLT 322 332    BMET:  Recent Labs  07/08/16 0310 07/28/2016 0350  NA 139 137  K 4.5 3.0*  CL 91* 91*  CO2 36* 36*  GLUCOSE 107* 117*  BUN 37* 32*  CREATININE 1.30* 1.14*  CALCIUM 8.5* 8.6*     PT/INR:   Recent Labs  07/24/2016 0350  LABPROT 21.6*  INR 1.85    CBG (last 3)   Recent Labs  07/08/16 1548 07/08/16 2138 07/20/2016 0709  GLUCAP 129* 113* 128*    ABG    Component Value Date/Time   PHART 7.447 07/08/2016 1012   PCO2ART 51.6 (H) 07/08/2016 1012   PO2ART 70.8 (L) 07/08/2016 1012   HCO3 35.1 (H) 07/08/2016 1012   TCO2 34 07/02/2016  1749   ACIDBASEDEF 3.0 (H) 06/26/2016 0859   O2SAT 94.2 07/08/2016 1012    CXR: Increased bilateral opacity L>R consistent with bilateral pleural effusions, atelectasis and possible pneumonia  CT ANGIOGRAPHY CHEST WITH CONTRAST  TECHNIQUE: Multidetector CT imaging of the chest was performed using the standard protocol during bolus administration of intravenous contrast. Multiplanar CT image reconstructions and MIPs were obtained to evaluate the vascular anatomy.  CONTRAST:  74 mL of Isovue 370 intravenously.  COMPARISON:  Radiograph of same day.  CT scan of July 03, 2016.  FINDINGS: Cardiovascular: Atherosclerosis of thoracic aorta is noted without aneurysm or dissection. There is no definite evidence of pulmonary embolus. Coronary artery calcifications are noted. Status post mitral valve repair.  Mediastinum/Nodes: Mildly enlarged adenopathy is noted which is not significantly changed compared to prior exam and most likely reactive in etiology.  Lungs/Pleura: No pneumothorax is noted. Large bilateral pleural effusions are noted with adjacent atelectasis of the upper lobes and complete atelectasis of both lower lobes. Endotracheal and nasogastric tubes are in grossly good position.  Upper Abdomen: No significant abnormality seen in the visualized portion of upper abdomen.  Musculoskeletal: Multilevel degenerative disc disease is noted in the thoracic spine.  Review of the MIP images confirms the above findings.  IMPRESSION: Aortic atherosclerosis.  No definite evidence of pulmonary embolus.  Large bilateral pleural effusions are noted with adjacent atelectasis of the upper lobes and complete atelectasis of both lower lobes.   Electronically Signed   By: Lupita Raider, M.D.   On: 07/08/2016 12:04   Assessment/Plan:  Now POD14 from original surgery Acute on chronic respiratory failure, reintubated and now vent-dependent due to refractory  complete collapse of left lung secondary to airway secretions and poor pulmonary toilet CXR looks worse this morning and CTA chest yesterday revealed moderate-large bilateral pleural effusions L>R HCAP -  culture results from FOB performed 10/7 reported "normal flora" repeat BAL from 10/9 also reported "normal respiratory flora" - day #5Fortaz day #3 Vanc Maintaining stable rhythm and HR and BP although now on neo and dopamine for BP support due to hypotension associated with intravenous sedation Permanent atrial fibrillation now s/p maze procedure - HR stable and well controlled Acute on chronic diastolic CHF with expected post-op volume excess, weight reportedly down 1 lb but I/O's positive 0.5 liter yesterday - diuresing well on low dose lasix drip Hypokalemia - diuretic induced  Expected post op acute blood loss anemia, Hgb down 8.5this morning Leukocytosis - WBC slowly climbing - presumably due to HCAP Longstanding rheumatic heart disease  Pulmonary hypertension Chronic kidney disease stage III, creatinine stable Morbid obesity Physical deconditioning - severe Protein-depleted malnutrition Chronic venous insufficiency   Will plan to place bilateral Pleur-X catheters to drain pleural effusions and repeat bronchoscopy for pulmonary toilet prior to any attempts at acute vent wean - discussed with patient's son at bedside who understands and provides consent  Transfuse 2 units PRBC's for improved O2 carrying capacity  Continue Vanc + Fortaz  Increase coumadin  Place small bore feeding tube if unable to wean from vent following repeat bronch and drainage of pleural effusions  Purcell Nails, MD July 22, 2016 7:41 AM

## 2016-07-09 NOTE — Anesthesia Postprocedure Evaluation (Signed)
Anesthesia Post Note  Patient: Ann PoeGloria Potter  Procedure(s) Performed: Procedure(s) (LRB): INSERTION PLEURAL DRAINAGE CATHETER (Bilateral) VIDEO BRONCHOSCOPY (N/A)  Patient location during evaluation: SICU Anesthesia Type: General Level of consciousness: sedated and patient remains intubated per anesthesia plan Pain management: pain level controlled Vital Signs Assessment: post-procedure vital signs reviewed and stable Respiratory status: patient remains intubated per anesthesia plan Cardiovascular status: stable Anesthetic complications: no    Last Vitals:  Vitals:   07/27/2016 1330 06/30/2016 1555  BP: (!) 101/47 130/60  Pulse: 61 68  Resp: 19 14  Temp:      Last Pain:  Vitals:   07/19/2016 1239  TempSrc: Oral  PainSc:                  Cecile HearingStephen Edward Turk

## 2016-07-09 NOTE — Op Note (Signed)
CARDIOTHORACIC SURGERY OPERATIVE NOTE  Date of Procedure:   07/10/2016  Preoperative Diagnosis:    Bilateral Pleural Effusions  Atelectasis  Hospital-Acquired Pneumonia  Postoperative Diagnosis:  same  Procedure:      Placement of Bilateral Pleur-X Catheters  Video Bronchoscopy for Endobronchial Lavage and Removal of Secretions  Surgeon:    Salvatore Decentlarence H. Cornelius Moraswen, MD  Assistant:    Ronn MelenaMechelle Gengler, CRNFA  Anesthesia:    General  Operative Findings:   Moderate sized thin serosanguinous pleural effusions  Copious thick endobronchial secretions, particularly in left lung   DETAILS OF THE OPERATIVE PROCEDURE  Patient is brought to the operating room on the above mentioned date and placed in the supine position on the operative table. Gen. endotracheal anesthesia is maintained under the care and direction of Dr. Desmond Lopeurk.  The patient's left chest is prepared and draped in a sterile manner. A small incision is made in the left anterior axillary line and an 18-gauge needle is utilized to puncture the left pleural space with the Seldinger technique. A guidewire is advanced into the pleural space under fluoroscopic guidance. One percent lidocaine solution is utilized to anesthetize the skin and subcutaneous tissues to create a subcutaneous tunnel directed inferiorly and anteriorly from the catheter insertion site. A Pleur-X catheter is tunneled through the subcutaneous tissues. Serial dilators are passed over the guidewire and the Pleur-X catheter is introduced into the left pleural space through the introducing sheath. Catheter position is verified using fluoroscopy. The catheter is immediately hooked to closed suction drainage device and approximately 650 mL of thin serosanguineous fluid is evacuated. The small incision at catheter entry site is closed with interrupted absorbable suture. A nylon suture is utilized to secure the catheter to the skin. A dry sterile dressing was applied.  The  patient's right chest is prepared and draped in a sterile manner. A small incision is made in the right anterior axillary line and an 18-gauge needle is utilized to puncture the left pleural space with the Seldinger technique. A guidewire is advanced into the pleural space under fluoroscopic guidance. One percent lidocaine solution is utilized to anesthetize the skin and subcutaneous tissues to create a subcutaneous tunnel directed inferiorly and anteriorly from the catheter insertion site. A Pleur-X catheter is tunneled through the subcutaneous tissues. Serial dilators are passed over the guidewire and the Pleur-X catheter is introduced into the left pleural space through the introducing sheath. Catheter position is verified using fluoroscopy. The catheter is immediately hooked to closed suction drainage device and approximately 5000 mL of thin serosanguineous fluid is evacuated. The small incision at catheter entry site is closed with interrupted absorbable suture. A nylon suture is utilized to secure the catheter to the skin. A dry sterile dressing was applied.  Video bronchoscopy is performed through the patient's existing endotracheal tube. A 10 mm flexible fiberoptic scope is passed uneventfully through the endotracheal tube. The distal trachea and both left and right endobronchial trees are explored. There are copious thick airway secretions, particularly emanating from the left lung. Secretions are suctioned extensively. This requires removal of the bronchoscope to be irrigated periodically because of the thickness of the secretions tending to plug the bronchoscope. The left mainstem bronchus and both left upper lobe and left lower lobe segmental bronchi are irrigated with copious saline solution. Endobronchial lavage fluid is trapped to be sent for routine culture. There are no endobronchial abnormalities noted. There is diffuse inflammation. The bronchoscope was removed uneventfully.  The patient is  transported back to  the surgical intensive care unit intubated, sedated, on mechanical ventilation in stable hemodynamic condition. There are no intraoperative complications.    Salvatore Decent. Cornelius Moras MD 07/18/2016 3:38 PM

## 2016-07-09 NOTE — Progress Notes (Signed)
Pt returned to 2S01 from OR and placed back on full vent support.

## 2016-07-09 NOTE — Anesthesia Preprocedure Evaluation (Signed)
Anesthesia Evaluation  Patient identified by MRN, date of birth, ID band  Reviewed: Allergy & Precautions, NPO status , Patient's Chart, lab work & pertinent test results, Unable to perform ROS - Chart review only  Airway Mallampati: Intubated  TM Distance: >3 FB Neck ROM: Full    Dental  (+) Dental Advisory Given, Edentulous Upper, Edentulous Lower   Pulmonary asthma , COPD,  COPD inhaler, former smoker,    Pulmonary exam normal breath sounds clear to auscultation       Cardiovascular hypertension, Pt. on medications + CAD (non obstructive), + Past MI, + Peripheral Vascular Disease and +CHF  + dysrhythmias Atrial Fibrillation + Valvular Problems/Murmurs (s/p MVR, TVR) AI and MR  Rhythm:Irregular Rate:Normal  Echo 03/19/16: Study Conclusions  - Left ventricle: The cavity size was normal. Wall thickness wasnormal. Systolic function was normal. The estimated ejectionfraction was in the range of 60% to 65%. Wall motion was normal;there were no regional wall motion abnormalities. The study isnot technically sufficient to allow evaluation of LV diastolicfunction. - Aortic valve: Mildly to moderately calcified annulus. Trileaflet;mildly calcified leaflets. There was trivial regurgitation. - Mitral valve: Calcified annulus. Mildly thickened, mildly calcified leaflets . Mobility was restricted with morphology suggesting rheumatic mitral disease. The findings are consistentwith moderate to severe stenosis. There was trivialregurgitation. Mean gradient (D): 9 mm Hg. Valve area bycontinuity equation (using LVOT flow): 1.02 cm^2. - Left atrium: The atrium was moderately dilated. - Right ventricle: The cavity size was moderately dilated. Systolicfunction was mildly reduced. - Right atrium: Central venous pressure (est): 8 mm Hg. - Tricuspid valve: There was mild-moderate regurgitation. - Pulmonary arteries: Systolic pressure was severely  increased. PApeak pressure: 75 mm Hg (S). - Pericardium, extracardiac: There was no pericardial effusion.  Impressions:  - Normal LV wall thickness with LVEF 60-65%. Indeterminatediastolic function in setting of atrial fibrillation. Moderateleft atrial enlargement. Rheumatic mitral deformity with evidenceof moderate to severe mitral stenosis as outlined above andtrivial mitral regurgitation. Sclerotic aortic valve with trivialaortic regurgitation. Moderate RV enlargement with mildly reducedcontraction. Mild to moderate tricuspid regurgitation withevidence of severe pulmonary hypertension and PASP 75 mmHg.   Neuro/Psych negative neurological ROS     GI/Hepatic negative GI ROS, Neg liver ROS,   Endo/Other  Hypothyroidism Morbid obesity  Renal/GU Renal InsufficiencyRenal disease     Musculoskeletal  (+) Arthritis , Osteoarthritis,    Abdominal   Peds  Hematology  (+) Blood dyscrasia (Xarelto), anemia ,   Anesthesia Other Findings Day of surgery medications reviewed with the patient.  Reproductive/Obstetrics                             Anesthesia Physical  Anesthesia Plan  ASA: IV  Anesthesia Plan: General   Post-op Pain Management:    Induction: Inhalational  Airway Management Planned: Oral ETT  Additional Equipment:   Intra-op Plan:   Post-operative Plan: Post-operative intubation/ventilation  Informed Consent: I have reviewed the patients History and Physical, chart, labs and discussed the procedure including the risks, benefits and alternatives for the proposed anesthesia with the patient or authorized representative who has indicated his/her understanding and acceptance.   Dental advisory given  Plan Discussed with: CRNA  Anesthesia Plan Comments: (Risks/benefits of general anesthesia discussed with patient including risk of damage to teeth, lips, gum, and tongue, nausea/vomiting, allergic reactions to medications, and the  possibility of heart attack, stroke and death.  All patient/patient representative questions answered.  Patient/patient representative wishes to proceed.  Bring back directly from ICU. Patient is intubated, sedated.)        Anesthesia Quick Evaluation

## 2016-07-10 ENCOUNTER — Inpatient Hospital Stay (HOSPITAL_COMMUNITY): Payer: Medicare Other

## 2016-07-10 ENCOUNTER — Encounter (HOSPITAL_COMMUNITY): Payer: Self-pay | Admitting: Thoracic Surgery (Cardiothoracic Vascular Surgery)

## 2016-07-10 LAB — GLUCOSE, CAPILLARY
GLUCOSE-CAPILLARY: 132 mg/dL — AB (ref 65–99)
GLUCOSE-CAPILLARY: 103 mg/dL — AB (ref 65–99)
Glucose-Capillary: 138 mg/dL — ABNORMAL HIGH (ref 65–99)
Glucose-Capillary: 113 mg/dL — ABNORMAL HIGH (ref 65–99)

## 2016-07-10 LAB — CULTURE, RESPIRATORY W GRAM STAIN: Culture: NO GROWTH

## 2016-07-10 LAB — TYPE AND SCREEN
ABO/RH(D): A POS
Antibody Screen: NEGATIVE
UNIT DIVISION: 0
Unit division: 0

## 2016-07-10 LAB — COMPREHENSIVE METABOLIC PANEL
ALT: 17 U/L (ref 14–54)
ANION GAP: 11 (ref 5–15)
AST: 28 U/L (ref 15–41)
Albumin: 2.3 g/dL — ABNORMAL LOW (ref 3.5–5.0)
Alkaline Phosphatase: 108 U/L (ref 38–126)
BILIRUBIN TOTAL: 1.7 mg/dL — AB (ref 0.3–1.2)
BUN: 27 mg/dL — AB (ref 6–20)
CO2: 33 mmol/L — ABNORMAL HIGH (ref 22–32)
Calcium: 8.3 mg/dL — ABNORMAL LOW (ref 8.9–10.3)
Chloride: 93 mmol/L — ABNORMAL LOW (ref 101–111)
Creatinine, Ser: 1.14 mg/dL — ABNORMAL HIGH (ref 0.44–1.00)
GFR calc Af Amer: 53 mL/min — ABNORMAL LOW (ref >60)
GFR, EST NON AFRICAN AMERICAN: 46 mL/min — AB (ref >60)
Glucose, Bld: 142 mg/dL — ABNORMAL HIGH (ref 65–99)
POTASSIUM: 3.3 mmol/L — AB (ref 3.5–5.1)
Sodium: 137 mmol/L (ref 135–145)
TOTAL PROTEIN: 5.6 g/dL — AB (ref 6.5–8.1)

## 2016-07-10 LAB — CBC
HEMATOCRIT: 36.6 % (ref 36.0–46.0)
Hemoglobin: 11.9 g/dL — ABNORMAL LOW (ref 12.0–15.0)
MCH: 29.8 pg (ref 26.0–34.0)
MCHC: 32.2 g/dL (ref 30.0–36.0)
MCV: 92.4 fL (ref 78.0–100.0)
Platelets: 241 10*3/uL (ref 150–400)
RBC: 3.96 MIL/uL (ref 3.87–5.11)
RDW: 17.9 % — AB (ref 11.5–15.5)
WBC: 13.6 10*3/uL — ABNORMAL HIGH (ref 4.0–10.5)

## 2016-07-10 LAB — PROTIME-INR
INR: 1.53
Prothrombin Time: 18.5 seconds — ABNORMAL HIGH (ref 11.4–15.2)

## 2016-07-10 LAB — VANCOMYCIN, TROUGH: Vancomycin Tr: 32 ug/mL (ref 15–20)

## 2016-07-10 LAB — PROCALCITONIN: Procalcitonin: 0.68 ng/mL

## 2016-07-10 LAB — TRIGLYCERIDES: TRIGLYCERIDES: 89 mg/dL (ref <150)

## 2016-07-10 MED ORDER — POTASSIUM CHLORIDE 10 MEQ/50ML IV SOLN
10.0000 meq | INTRAVENOUS | Status: AC
  Administered 2016-07-10 (×3): 10 meq via INTRAVENOUS

## 2016-07-10 MED ORDER — POTASSIUM CHLORIDE 10 MEQ/50ML IV SOLN
10.0000 meq | INTRAVENOUS | Status: AC
  Administered 2016-07-10 (×3): 10 meq via INTRAVENOUS
  Filled 2016-07-10: qty 50

## 2016-07-10 MED ORDER — IPRATROPIUM BROMIDE 0.02 % IN SOLN
0.5000 mg | Freq: Four times a day (QID) | RESPIRATORY_TRACT | Status: DC
  Administered 2016-07-10 – 2016-07-20 (×42): 0.5 mg via RESPIRATORY_TRACT
  Filled 2016-07-10 (×42): qty 2.5

## 2016-07-10 MED ORDER — ACETYLCYSTEINE 20 % IN SOLN
3.0000 mL | Freq: Four times a day (QID) | RESPIRATORY_TRACT | Status: DC
  Administered 2016-07-10 – 2016-07-11 (×5): 3 mL via RESPIRATORY_TRACT
  Filled 2016-07-10 (×5): qty 4

## 2016-07-10 NOTE — Progress Notes (Signed)
Pt placed back on full vent support due to RR over 35.  RN aware.

## 2016-07-10 NOTE — Progress Notes (Signed)
PT Cancellation Note  Patient Details Name: Ann Potter MRN: [id number] DOB: 03-22-1941   Cancelled Treatment:    Reason Eval/Treat Not Completed: Medical issues which prohibited therapy (pt remains on vent post op. Will hold and check on pt 10/16 for medical readiness)   Delorse Lekabor, Dieter Hane Beth 07/10/2016, 7:05 AM Delaney MeigsMaija Tabor Trigger Frasier, PT [phone]

## 2016-07-10 NOTE — Progress Notes (Signed)
Name: Ann Potter MRN: [id number] DOB: 12-Oct-1940    ADMISSION DATE:  06/02/2016 CONSULTATION DATE:  10/8  REFERRING MD :  Tyrone Sage   CHIEF COMPLAINT:  White-out L hemithorax   BRIEF PATIENT DESCRIPTION: 75yo female with multiple medical problems including AFib, asthma, CAD, CHF, severe mitral stenosis, severe TR admitted 9/28 for MV replacement, tricuspid repair and MAZE.  Post op recovery has been slow and now c/b complete white-out of L hemithorax likely thought r/t mucous plugging.  Initially thought to be pleural effusion and pt was sent to IR for pigtail placement, however CT chest revealed only small amt of effusion and dense atx/collapse.  She was tried on chest PT, mucomyst and FOB by CVTS on 10/7 without significant improvement.  PCCM consulted 10/8 to assist.   SIGNIFICANT EVENTS  9/28 MV replacement, Tricuspid repair, MAZE  10/7 FOB  10/9 Repeat FOB 10/10 Repeat FOB in OR 10/12 Repeat FOB, B/L pleurex in OR  STUDIES:  CT chest 10/6>>> Dense atelectasis and collapse of the left upper lobe and lingula. There also is atelectasis of the left lower lobe with mucous plugging in the lower lobe bronchi. There is only a small partially loculated left pleural effusion and also a small right pleural effusion. Decision was made not to place a percutaneous chest tube CXR 10/9 > persistent collapse of lt lung   HISTORY OF PRESENT ILLNESS:  75yo female with multiple medical problems including AFib, asthma, CAD, CHF, severe mitral stenosis, severe TR admitted 9/28 for MV replacement, tricuspid repair and MAZE.  Post op recovery has been slow and now c/b complete white-out of L hemithorax likely thought r/t mucous plugging.  Initially thought to be pleural effusion and pt was sent to IR for pigtail placement, however CT chest revealed only small amt of effusion and dense atx/collapse.  She was tried on chest PT, mucomyst and FOB by CVTS on 10/7 without significant improvement.  PCCM  consulted 10/8 to assist.   PAST MEDICAL HISTORY :   has a past medical history of Anxiety; Arthritis; Asthma; Atrial fibrillation (HCC); Atrial fibrillation, chronic (HCC) (01/25/2012); CAD (coronary artery disease); CHF (congestive heart failure) (HCC); Chronic diastolic congestive heart failure (HCC) (01/25/2012); Chronic lower back pain; Chronic renal insufficiency, stage III (moderate) (01/25/2012); Chronic venous insufficiency; COPD (chronic obstructive pulmonary disease) (HCC); Heart murmur; HLD (hyperlipidemia); HTN (hypertension); Hypothyroidism; Lower extremity edema; Macular degeneration; Mitral stenosis; Morbid obesity (HCC); Myocardial infarction (2007); Pulmonary hypertension; Rheumatic fever during childhood; Rheumatic heart disease; S/P Maze operation for atrial fibrillation (06/02/2016); 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  has a past surgical history that includes Multiple tooth extractions (1965); Tubal ligation (1984); TEE with cardioversion; TEE without cardioversion (N/A, 06/25/2015); Cardiac catheterization (12/2011; 04/16/2016); Cataract extraction w/ intraocular lens  implant, bilateral (Bilateral); Cardiac catheterization (N/A, 04/16/2016); Cardioversion; Mitral valve replacement (N/A, 05/30/2016); Tricuspid valve replacement (N/A, 06/10/2016); MAZE (N/A, 06/10/2016); TEE without cardioversion (N/A, 06/03/2016); and Video bronchoscopy (N/A, 07/01/2016). Prior to Admission medications   Medication Sig Start Date End Date Taking? Authorizing Provider  acetaminophen (TYLENOL) 650 MG CR tablet Take 650 mg by mouth every 8 (eight) hours as needed for pain.   Yes Historical Provider, MD  albuterol (PROVENTIL HFA;VENTOLIN HFA) 108 (90 BASE) MCG/ACT inhaler Inhale 2 puffs into the lungs every 6 (six) hours as needed. For shortness of breath   Yes Historical Provider, MD  diltiazem (CARDIZEM) 30  MG tablet Take 1 tablet (30 mg  total) by mouth 2 (two) times daily. 07/03/15  Yes Antoine Poche, MD  Fluticasone-Salmeterol (ADVAIR) 250-50 MCG/DOSE AEPB Inhale 1 puff into the lungs 2 (two) times daily.   Yes Historical Provider, MD  levothyroxine (SYNTHROID, LEVOTHROID) 50 MCG tablet Take 50 mcg by mouth daily.   Yes Historical Provider, MD  loratadine (CLARITIN) 10 MG tablet Take 10 mg by mouth as needed for allergies.    Yes Historical Provider, MD  metolazone (ZAROXOLYN) 2.5 MG tablet Take 1 tab every other week;may take weekly for additional swelling as needed. Patient taking differently: Take 2.5 mg by mouth See admin instructions. Take 2.5 mg by mouth every other week;may take weekly for additional swelling as needed. 05/21/16  Yes Antoine Poche, MD  montelukast (SINGULAIR) 10 MG tablet Take 10 mg by mouth daily.    Yes Historical Provider, MD  potassium chloride SA (K-DUR,KLOR-CON) 20 MEQ tablet Take 2 tablets (40 mEq total) by mouth 2 (two) times daily. 04/17/16  Yes Arty Baumgartner, NP  rivaroxaban (XARELTO) 20 MG TABS tablet Take 1 tablet (20 mg total) by mouth daily with supper. 10/01/15  Yes Antoine Poche, MD  rosuvastatin (CRESTOR) 10 MG tablet Take 10 mg by mouth daily.   Yes Historical Provider, MD  torsemide (DEMADEX) 20 MG tablet Take 4 tablets (80 mg total) by mouth 2 (two) times daily. 04/08/16  Yes Antoine Poche, MD   Allergies  Allergen Reactions  . Relafen [Nabumetone] Other (See Comments)    Bladder pain    FAMILY HISTORY:  family history includes Cancer in her mother and sister; Deep vein thrombosis in her father; Heart disease (age of onset: 1) in her father; Stomach cancer in her brother. SOCIAL HISTORY:  reports that she has quit smoking. Her smoking use included Cigarettes. She started smoking about 56 years ago. She has a 11.00 pack-year smoking history. She has never used smokeless tobacco. She reports that she does not drink alcohol or use  drugs.  REVIEW OF SYSTEMS:   As per HPI - All other systems reviewed and were neg.    SUBJECTIVE:   VITAL SIGNS: Temp:  [97 F (36.1 C)-98.6 F (37 C)] 97.1 F (36.2 C) (10/13 0736) Pulse Rate:  [55-71] 71 (10/13 0730) Resp:  [14-28] 26 (10/13 0730) BP: (85-184)/(33-172) 99/65 (10/13 0717) SpO2:  [95 %-100 %] 95 % (10/13 0730) FiO2 (%):  [50 %] 50 % (10/13 0717) Weight:  [241 lb 2.9 oz (109.4 kg)] 241 lb 2.9 oz (109.4 kg) (10/13 0500)  PHYSICAL EXAMINATION: General:  Obese female, No distress Neuro:  Sedated, intubated no focal deficits HEENT:  Mm moist, no JVD  Cardiovascular:  S1S2, RRR, No MRG Lungs:  Scattered crackles, No wheeze  Abdomen:  Round, soft, non tender  Musculoskeletal:  Warm and dry, 1+ BLE edema    Recent Labs Lab 07/08/16 0310 07/05/2016 0350 07/10/16 0325  NA 139 137 137  K 4.5 3.0* 3.3*  CL 91* 91* 93*  CO2 36* 36* 33*  BUN 37* 32* 27*  CREATININE 1.30* 1.14* 1.14*  GLUCOSE 107* 117* 142*    Recent Labs Lab 07/08/16 0310 07/01/2016 0350 07/10/16 0325  HGB 9.4* 8.5* 11.9*  HCT 29.9* 26.7* 36.6  WBC 14.1* 15.3* 13.6*  PLT 322 332 241   Ct Angio Chest Pe W Or Wo Contrast  Result Date: 07/08/2016 CLINICAL DATA:  Pneumonia, congestive heart failure. EXAM: CT ANGIOGRAPHY CHEST WITH CONTRAST TECHNIQUE: Multidetector CT imaging of the chest was performed  using the standard protocol during bolus administration of intravenous contrast. Multiplanar CT image reconstructions and MIPs were obtained to evaluate the vascular anatomy. CONTRAST:  74 mL of Isovue 370 intravenously. COMPARISON:  Radiograph of same day.  CT scan of July 03, 2016. FINDINGS: Cardiovascular: Atherosclerosis of thoracic aorta is noted without aneurysm or dissection. There is no definite evidence of pulmonary embolus. Coronary artery calcifications are noted. Status post mitral valve repair. Mediastinum/Nodes: Mildly enlarged adenopathy is noted which is not significantly changed  compared to prior exam and most likely reactive in etiology. Lungs/Pleura: No pneumothorax is noted. Large bilateral pleural effusions are noted with adjacent atelectasis of the upper lobes and complete atelectasis of both lower lobes. Endotracheal and nasogastric tubes are in grossly good position. Upper Abdomen: No significant abnormality seen in the visualized portion of upper abdomen. Musculoskeletal: Multilevel degenerative disc disease is noted in the thoracic spine. Review of the MIP images confirms the above findings. IMPRESSION: Aortic atherosclerosis. No definite evidence of pulmonary embolus. Large bilateral pleural effusions are noted with adjacent atelectasis of the upper lobes and complete atelectasis of both lower lobes. Electronically Signed   By: Lupita RaiderJames  Green Jr, M.D.   On: 07/08/2016 12:04   Dg Chest Port 1 View  Result Date: 07/10/2016 CLINICAL DATA:  Atelectasis.  Intubated. EXAM: PORTABLE CHEST 1 VIEW COMPARISON:  Chest radiograph from one day prior. FINDINGS: Endotracheal tube tip is 3.7 cm above the carina. Enteric tube enters stomach with the tip not seen on this image. Right PICC terminates in the low superior vena cava with the tip not well seen on this view. Biapical chest tubes are stable in position. Cardiac valvular prostheses are stable in position. Median sternotomy wires are aligned and intact. Stable cardiomediastinal silhouette with mild cardiomegaly. No pneumothorax. Stable small bilateral pleural effusions. Stable mild pulmonary edema and bibasilar atelectasis. IMPRESSION: 1. Support structures as described.  No pneumothorax. 2. Stable mild congestive heart failure. 3. Stable small bilateral pleural effusions and bibasilar atelectasis. Electronically Signed   By: Delbert PhenixJason A Poff M.D.   On: 07/10/2016 08:12   Dg Chest Port 1 View  Result Date: 08-07-16 CLINICAL DATA:  Status post bilateral pleural drainage catheter placement EXAM: PORTABLE CHEST 1 VIEW COMPARISON:   08-07-16 FINDINGS: AP semi portable view of the chest. Endotracheal tube tip is approximately 4.5 cm superior to the carina. Median sternotomy wires and valvular prosthesis are again noted. Esophageal tube extends below diaphragm, tip is not included. A right upper extremity catheter tip overlies the SVC. Interim placement of bilateral chest drainage catheters, on the right the distal end of the catheter is curled over the right upper lobe. On the left, the distal portion of catheter overlies the medial chest. Interval decrease in size of bilateral pleural effusions with small residuals noted. No definitive pneumothorax identified. Persistent dense consolidation within both lung bases. Cardiomediastinal silhouette is enlarged. IMPRESSION: 1. Interim placement of bilateral chest drainage catheters with decreased bilateral pleural effusions. Improved aeration on the left side. There are residual pleural effusions noted. 2. Persistent dense consolidation within both lung bases. 3. Stable enlarged cardiomediastinal silhouette. 4. Support lines and tubes as described above. Electronically Signed   By: Jasmine PangKim  Fujinaga M.D.   On: 08-07-16 21:16   Dg Chest Port 1 View  Result Date: 08-07-16 CLINICAL DATA:  Hypoxia EXAM: PORTABLE CHEST 1 VIEW COMPARISON:  Chest radiograph and chest CT July 08, 2016 FINDINGS: Endotracheal tube tip is 2.8 cm above the carina. Nasogastric tube tip and side  port are below the diaphragm. Central catheter tip is in superior vena cava. No pneumothorax. There are pleural effusions bilaterally, larger on the left than on the right. There is cardiomegaly with pulmonary venous hypertension. Patient is status post mitral valve replacement with left atrial appendage clamp also noted. No adenopathy evident. IMPRESSION: Tube and catheter positions as described without pneumothorax. Sizable pleural effusions bilaterally, left larger than right. Left effusion may be slightly larger compared to 1  day prior. Stable cardiac silhouette. Electronically Signed   By: Bretta Bang III M.D.   On: 07/04/2016 08:19    ASSESSMENT / PLAN: S/p MV replacement tricuspid repair and MAZE procedure on 9/29 Atelectasis/collapse with complete opacification L hemithorax. No significant improvement with chest PT, mucomyst, FOB x 3. Underwent 4th bronch procedure yesterday with B/L pleurex cath placement. CXR looks better today AM.  Reccs: Tolerating PSV trials today but has low tidal volumes on 5/5. Not ready for extubation yet. Will reassess in afternoon.  Continue mucomyst, xopenex and atrovent nebs. On vanco, ceftaz for empiric treatment of HCAP  Chilton Greathouse MD [facility] Pager [phone] If no answer or after 3pm call: [phone] 07/10/2016, 8:49 AM

## 2016-07-10 NOTE — Progress Notes (Signed)
Cortrak tube placed in patients right nare to 68cm. Pt tolerated fairly well. Xray ordered and tube added to assessment. Please save tube if it become dislodged. Tube can be reinserted. Please call cortrak team with any questions or concerns.

## 2016-07-10 NOTE — Progress Notes (Signed)
[facility] BRIEF SICU PROGRESS NOTE  1 Day Post-Op  S/P Procedure(s) (LRB): INSERTION PLEURAL DRAINAGE CATHETER (Bilateral) VIDEO BRONCHOSCOPY (N/A)   Patient reportedly did not do well w/ acute vent wean earlier today.  Plan: Will place feeding tube to begin enteral nutritional support.  She is high risk for aspiration so tube should be placed post-pyloric prior to initiating feeds.  Discussed her current status and prognosis at length with her son at the bedside.  Purcell Nailslarence H Owen, MD 07/10/2016 4:57 PM

## 2016-07-10 NOTE — Care Management Important Message (Signed)
Important Message  Patient Details  Name: Ann Potter MRN: [id number] Date of Birth: 1940/12/08   Medicare Important Message Given:  Yes    Kyla BalzarineShealy, Rico Massar [name] 07/10/2016, 9:57 AM

## 2016-07-10 NOTE — Progress Notes (Signed)
Pt placed back on full vent support per Dr. Shirlee MoreMannam's request.

## 2016-07-10 NOTE — Progress Notes (Signed)
ANTIBIOTIC CONSULT NOTE   Pharmacy Consult for Vancomycin Indication: HCAP  Allergies  Allergen Reactions  . Relafen [Nabumetone] Other (See Comments)    Bladder pain    Patient Measurements: Height: 5\' 1"  (154.9 cm) Weight: 241 lb 2.9 oz (109.4 kg) IBW/kg (Calculated) : 47.8  Vital Signs: Temp: 97.2 F (36.2 C) (10/13 1535) Temp Source: Axillary (10/13 1535) BP: 96/38 (10/13 1315) Pulse Rate: 70 (10/13 1545) Intake/Output from previous day: 10/12 0701 - 10/13 0700 In: 3509.4 [I.V.:2491.9; Blood:737.5; NG/GT:30; IV Piggyback:250] Out: 4090 [Urine:2840; Blood:50] Intake/Output from this shift: Total I/O In: 586 [I.V.:286; IV Piggyback:300] Out: 1025 [Urine:775; Chest Tube:250]  Labs:  Recent Labs  07/08/16 0310 07/05/2016 0350 07/10/16 0325  WBC 14.1* 15.3* 13.6*  HGB 9.4* 8.5* 11.9*  PLT 322 332 241  CREATININE 1.30* 1.14* 1.14*   Estimated Creatinine Clearance: 48 mL/min (by C-G formula based on SCr of 1.14 mg/dL (H)).  Recent Labs  07/10/16 1430  VANCOTROUGH 32*     Microbiology:   Medical History: Past Medical History:  Diagnosis Date  . Anxiety   . Arthritis    "feet & legs" (04/16/2016)  . Asthma   . Atrial fibrillation (HCC)   . Atrial fibrillation, chronic (HCC) 01/25/2012   Status post DCCV, June 2013  . CAD (coronary artery disease)    Nonobstructive, by catheterization 4/13  . CHF (congestive heart failure) (HCC)   . Chronic diastolic congestive heart failure (HCC) 01/25/2012  . Chronic lower back pain   . Chronic renal insufficiency, stage III (moderate) 01/25/2012  . Chronic venous insufficiency   . COPD (chronic obstructive pulmonary disease) (HCC)   . Heart murmur   . HLD (hyperlipidemia)   . HTN (hypertension)   . Hypothyroidism   . Lower extremity edema   . Macular degeneration   . Mitral stenosis    Mild, by catheterization 4/13  . Morbid obesity (HCC)   . Myocardial infarction 2007  . Pulmonary hypertension    Moderate, by  catheterization 4/13  . Rheumatic fever during childhood    Age 75  . Rheumatic heart disease   . S/P Maze operation for atrial fibrillation 06/03/2016   Complete bilateral atrial lesion set using cryothermy and bipolar radiofrequency ablation with clipping of LA appendage  . S/P mitral valve replacement with bioprosthetic valve 06/20/2016   29 mm [facility] Mitral bovine pericardial tissue valve  . 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 Tricuspid regurgitation   . Worries     Assessment: ID: day # 7 Ceftaz and # 5 Vanc for new pulm process/HCAP coverage. Small right pleural effusion; 100% collapse of left lung d/t secretions and poor pulm toilet. Repeat bronch done 10/9, then to OR on 10/10 for repeat bronch. For CTA 10/11 to eval pleural effusions. - Afeb, WBC 15.3>13.6.Scr 1.14 improved, PCT 0.68, continue abx.  Antimicrobials this admission:  Cefuroxime 9/28>>9/30 Ceftaz 10/6 >> Vanc 9/28 x 2 (OR day). Resume 10/9>>  Dose adjustments this admission:  10/13: VT=32. Hold Vanco  Microbiology results:  10/10 acid fast smear (BAL) - negative 10/10 BAL specimen B  - no organisms seen 10/10 BAL specimen C - no organisms seen 10/9 bronchial washings - 10K normal flora 10/9 TA - normal flora 10/6 TA - no organisms seen 9/25 MRSA PCR neg   Goal of Therapy:  Vancomycin trough level 15-20 mcg/ml  Plan:  Hold Vancomycin Random level in AM.  Redose when Vanco level <20.  Kam Rahimi S. Merilynn Finlandobertson, PharmD, BCPS Clinical Staff Pharmacist Pager [phone]  Misty Stanleyobertson, Macrina Lehnert Stillinger 07/10/2016,4:07 PM

## 2016-07-10 NOTE — Progress Notes (Addendum)
[address]       [name] [id number]             [phone]        CARDIOTHORACIC SURGERY PROGRESS NOTE  R15Days Post-Op Procedure(s) (LRB): MITRAL VALVE (MV) REPLACEMENT (N/A) TRICUSPID VALVE REPAIR (N/A) MAZE (N/A) TRANSESOPHAGEAL ECHOCARDIOGRAM (TEE) (N/A)   R1 Day Post-Op Procedure(s) (LRB): INSERTION PLEURAL DRAINAGE CATHETER (Bilateral) VIDEO BRONCHOSCOPY (N/A)  Subjective: Awake and alert on vent.  Looks comfortable.  Follows simple commands.  Objective: Vital signs: BP Readings from Last 1 Encounters:  07/10/16 99/65   Pulse Readings from Last 1 Encounters:  07/10/16 71   Resp Readings from Last 1 Encounters:  07/10/16 (!) 26   Temp Readings from Last 1 Encounters:  07/10/16 97.1 F (36.2 C) (Axillary)    Hemodynamics:    Physical Exam:  Rhythm:   junctional  Breath sounds: Fairly clear  Heart sounds:  RRR  Incisions:  Clean and dry  Abdomen:  Soft, non-distended, non-tender  Extremities:  Warm, well-perfused, swollen   Intake/Output from previous day: 10/12 0701 - 10/13 0700 In: 3509.4 [I.V.:2491.9; Blood:737.5; NG/GT:30; IV Piggyback:250] Out: 4090 [Urine:2840; Blood:50] Intake/Output this shift: No intake/output data recorded.  Lab Results:  CBC: Recent Labs  2016-06-01 0350 07/10/16 0325  WBC 15.3* 13.6*  HGB 8.5* 11.9*  HCT 26.7* 36.6  PLT 332 241    BMET:  Recent Labs  2016-06-01 0350 07/10/16 0325  NA 137 137  K 3.0* 3.3*  CL 91* 93*  CO2 36* 33*  GLUCOSE 117* 142*  BUN 32* 27*  CREATININE 1.14* 1.14*  CALCIUM 8.6* 8.3*     PT/INR:   Recent Labs  07/10/16 0325  LABPROT 18.5*  INR 1.53    CBG (last 3)   Recent Labs  2016-06-01 1142 2016-06-01 1732 07/10/16 0732  GLUCAP 120* 98 132*    ABG    Component Value Date/Time   PHART 7.447 07/08/2016 1012   PCO2ART 51.6 (H) 07/08/2016 1012   PO2ART 70.8 (L) 07/08/2016 1012   HCO3 35.1 (H) 07/08/2016 1012   TCO2 34 07/02/2016 1749   ACIDBASEDEF 3.0 (H) 06/26/2016 0859   O2SAT 94.2 07/08/2016 1012    CXR: PORTABLE CHEST 1 VIEW  COMPARISON:  Chest radiograph from one day prior.  FINDINGS: Endotracheal tube tip is 3.7 cm above the carina. Enteric tube enters stomach with the tip not seen on this image. Right PICC terminates in the low superior vena cava with the tip not well seen on this view. Biapical chest tubes are stable in position. Cardiac valvular prostheses are stable in position. Median sternotomy wires are aligned and intact. Stable cardiomediastinal silhouette with mild cardiomegaly. No pneumothorax. Stable small bilateral pleural effusions. Stable mild pulmonary edema and bibasilar atelectasis.  IMPRESSION: 1. Support structures as described.  No pneumothorax. 2. Stable mild congestive heart failure. 3. Stable small bilateral pleural effusions and bibasilar atelectasis.   Electronically Signed   By: Delbert PhenixJason A Poff M.D.   On: 07/10/2016 08:12   Assessment/Plan:  NowPOD15 from original surgery Acute on chronic respiratory failure, reintubated and vent-dependent due to refractory complete collapse of left lung secondary to airway secretions and poor pulmonary toilet - much improved over last 48 hours - CXR looks remarkably good and oxygenation stable HCAP - culture results from FOB performed 10/7 reported "normal flora" repeat BAL specimens from 10/9 and 10/10 also reported "normal respiratory flora"- day #6Fortaz day #4 Vanc Maintaining stable rhythm and HR  and BP although now on neo and dopamine for BP support due to hypotension associated with intravenous sedation Permanent atrial fibrillation now s/p maze procedure - HR stable and well controlled Acute on chronic diastolic CHF with expected post-op volume excess, weight reportedly stable and I/O's negative 0.5 liter yesterday - diuresing well on low dose lasix drip Hypokalemia - diuretic induced  Expected post op acute blood loss anemia,  Hgb up to11.9this morning after transfusion Leukocytosis - WBC trending down - presumably due to HCAP Longstanding rheumatic heart disease  Pulmonary hypertension Chronic kidney disease stage III, creatinine stable Morbid obesity Physical deconditioning - severe Protein-depleted malnutrition Chronic venous insufficiency   Drain Pleur-X catheters daily  Attempt acute vent wean, possible extubation  Continue Vanc + Fortaz  Continue coumadin  Place small bore feeding tube if unable to wean from vent   Ann Nails, MD 07/10/2016 8:32 AM

## 2016-07-11 ENCOUNTER — Inpatient Hospital Stay (HOSPITAL_COMMUNITY): Payer: Medicare Other

## 2016-07-11 DIAGNOSIS — J9809 Other diseases of bronchus, not elsewhere classified: Secondary | ICD-10-CM

## 2016-07-11 DIAGNOSIS — J9601 Acute respiratory failure with hypoxia: Secondary | ICD-10-CM

## 2016-07-11 DIAGNOSIS — J189 Pneumonia, unspecified organism: Secondary | ICD-10-CM

## 2016-07-11 LAB — CBC
HCT: 33.4 % — ABNORMAL LOW (ref 36.0–46.0)
Hemoglobin: 10.9 g/dL — ABNORMAL LOW (ref 12.0–15.0)
MCH: 30.4 pg (ref 26.0–34.0)
MCHC: 32.6 g/dL (ref 30.0–36.0)
MCV: 93 fL (ref 78.0–100.0)
PLATELETS: 223 10*3/uL (ref 150–400)
RBC: 3.59 MIL/uL — AB (ref 3.87–5.11)
RDW: 17.5 % — AB (ref 11.5–15.5)
WBC: 14 10*3/uL — AB (ref 4.0–10.5)

## 2016-07-11 LAB — BASIC METABOLIC PANEL
Anion gap: 12 (ref 5–15)
BUN: 27 mg/dL — AB (ref 6–20)
CALCIUM: 7.6 mg/dL — AB (ref 8.9–10.3)
CHLORIDE: 91 mmol/L — AB (ref 101–111)
CO2: 30 mmol/L (ref 22–32)
CREATININE: 1.11 mg/dL — AB (ref 0.44–1.00)
GFR, EST AFRICAN AMERICAN: 54 mL/min — AB (ref >60)
GFR, EST NON AFRICAN AMERICAN: 47 mL/min — AB (ref >60)
Glucose, Bld: 279 mg/dL — ABNORMAL HIGH (ref 65–99)
Potassium: 3.5 mmol/L (ref 3.5–5.1)
SODIUM: 133 mmol/L — AB (ref 135–145)

## 2016-07-11 LAB — BLOOD GAS, ARTERIAL
Acid-Base Excess: 9.5 mmol/L — ABNORMAL HIGH (ref 0.0–2.0)
Bicarbonate: 33.9 mmol/L — ABNORMAL HIGH (ref 20.0–28.0)
Drawn by: 331761
FIO2: 0.5
Mode: POSITIVE
O2 Saturation: 95.7 %
PEEP: 5 cmH2O
Patient temperature: 97.9
Pressure support: 8 cmH2O
pCO2 arterial: 49.1 mmHg — ABNORMAL HIGH (ref 32.0–48.0)
pH, Arterial: 7.451 — ABNORMAL HIGH (ref 7.350–7.450)
pO2, Arterial: 78.2 mmHg — ABNORMAL LOW (ref 83.0–108.0)

## 2016-07-11 LAB — GLUCOSE, CAPILLARY
GLUCOSE-CAPILLARY: 109 mg/dL — AB (ref 65–99)
GLUCOSE-CAPILLARY: 120 mg/dL — AB (ref 65–99)
GLUCOSE-CAPILLARY: 135 mg/dL — AB (ref 65–99)
GLUCOSE-CAPILLARY: 122 mg/dL — AB (ref 65–99)

## 2016-07-11 LAB — PROTIME-INR
INR: 1.6
PROTHROMBIN TIME: 19.2 s — AB (ref 11.4–15.2)

## 2016-07-11 LAB — VANCOMYCIN, RANDOM: VANCOMYCIN RM: 25

## 2016-07-11 LAB — PROCALCITONIN: PROCALCITONIN: 0.68 ng/mL

## 2016-07-11 MED ORDER — JEVITY 1.2 CAL PO LIQD
1000.0000 mL | ORAL | Status: DC
  Administered 2016-07-11: 30 mL/h
  Administered 2016-07-12: 70 mL/h
  Administered 2016-07-12 (×2): 1000 mL
  Filled 2016-07-11 (×7): qty 1000

## 2016-07-11 MED ORDER — SODIUM CHLORIDE 3 % IN NEBU
4.0000 mL | INHALATION_SOLUTION | Freq: Two times a day (BID) | RESPIRATORY_TRACT | Status: AC
  Administered 2016-07-11 – 2016-07-14 (×6): 4 mL via RESPIRATORY_TRACT
  Filled 2016-07-11 (×6): qty 4

## 2016-07-11 MED ORDER — POTASSIUM CHLORIDE 10 MEQ/50ML IV SOLN
10.0000 meq | INTRAVENOUS | Status: AC
  Administered 2016-07-11 (×3): 10 meq via INTRAVENOUS
  Filled 2016-07-11 (×2): qty 50

## 2016-07-11 MED ORDER — JEVITY 1.2 CAL PO LIQD
237.0000 mL | ORAL | Status: DC
  Filled 2016-07-11 (×4): qty 237

## 2016-07-11 MED ORDER — WARFARIN SODIUM 3 MG PO TABS
3.0000 mg | ORAL_TABLET | Freq: Every day | ORAL | Status: DC
  Administered 2016-07-11 – 2016-07-12 (×2): 3 mg
  Filled 2016-07-11 (×2): qty 1

## 2016-07-11 NOTE — Progress Notes (Signed)
ANTIBIOTIC CONSULT NOTE   Pharmacy Consult for Vancomycin Indication: pneumonia  Allergies  Allergen Reactions  . Relafen [Nabumetone] Other (See Comments)    Bladder pain    Patient Measurements: Height: 5\' 1"  (154.9 cm) Weight: 251 lb 5.2 oz (114 kg) IBW/kg (Calculated) : 47.8   Vital Signs: Temp: 97.9 F (36.6 C) (10/14 0714) Temp Source: Axillary (10/14 0714) BP: 96/44 (10/14 1000) Pulse Rate: 71 (10/14 1000) Intake/Output from previous day: 10/13 0701 - 10/14 0700 In: 1921.2 [I.V.:1421.2; IV Piggyback:500] Out: 2355 [Urine:2055; Emesis/NG output:50; Chest Tube:250] Intake/Output from this shift: Total I/O In: 302.6 [I.V.:192.6; NG/GT:60; IV Piggyback:50] Out: 340 [Urine:340]  Labs:  Recent Labs  07/16/2016 0350 07/10/16 0325 07/11/16 0430  WBC 15.3* 13.6* 14.0*  HGB 8.5* 11.9* 10.9*  PLT 332 241 223  CREATININE 1.14* 1.14* 1.11*   Estimated Creatinine Clearance: 50.6 mL/min (by C-G formula based on SCr of 1.11 mg/dL (H)).  Recent Labs  07/10/16 1430 07/11/16 0432  VANCOTROUGH 32*  --   VANCORANDOM  --  25     Microbiology:   Medical History: Past Medical History:  Diagnosis Date  . Anxiety   . Arthritis    "feet & legs" (04/16/2016)  . Asthma   . Atrial fibrillation (HCC)   . Atrial fibrillation, chronic (HCC) 01/25/2012   Status post DCCV, June 2013  . CAD (coronary artery disease)    Nonobstructive, by catheterization 4/13  . CHF (congestive heart failure) (HCC)   . Chronic diastolic congestive heart failure (HCC) 01/25/2012  . Chronic lower back pain   . Chronic renal insufficiency, stage III (moderate) 01/25/2012  . Chronic venous insufficiency   . COPD (chronic obstructive pulmonary disease) (HCC)   . Heart murmur   . HLD (hyperlipidemia)   . HTN (hypertension)   . Hypothyroidism   . Lower extremity edema   . Macular degeneration   . Mitral stenosis    Mild, by catheterization 4/13  . Morbid obesity (HCC)   . Myocardial infarction  2007  . Pulmonary hypertension    Moderate, by catheterization 4/13  . Rheumatic fever during childhood    Age [age]  . Rheumatic heart disease   . S/P Maze operation for atrial fibrillation 06/27/2016   Complete bilateral atrial lesion set using cryothermy and bipolar radiofrequency ablation with clipping of LA appendage  . S/P mitral valve replacement with bioprosthetic valve 06/23/2016   [facility] bovine pericardial tissue valve  . S/P tricuspid valve repair 06/01/2016   26 mm Edwards mc3 ring annuloplasty  . Tricuspid regurgitation   . Worries     Assessment:  ID: day # 8 Ceftaz and # 6 Vanc for new pulm process/HCAP coverage. Small right pleural effusion; 100% collapse of left lung d/t secretions and poor pulm toilet. Repeat bronch done 10/9, then to OR on 10/10 for repeat bronch. For CTA 10/11 to eval pleural effusions. - Afeb, WBC 14 remaining about the same..Scr 1.11 down, PCT 0.68   Antimicrobials this admission:  Cefuroxime 9/28>>9/30 Ceftaz 10/6 >> Vanc 9/28 x 2 (OR day). Resume 10/9>>  Dose adjustments this admission:  10/13: VT=32. Hold Vanco 10/14: VR=25. Hold Vanco  Microbiology results:  10/10 acid fast smear (BAL) - negative 10/10 BAL specimen B  - no organisms seen 10/10 BAL specimen C - no organisms seen 10/9 bronchial washings - 10K normal flora 10/9 TA - normal flora 10/6 TA - no organisms seen 9/25 MRSA PCR neg  Goal of Therapy:  Vancomycin  trough level 15-20 mcg/ml  Plan:  Vanco random level in AM. Likely will be able to resume lower dose every 24hrs with improved renal function.   Natayah Warmack S. Merilynn Finland, PharmD, BCPS Clinical Staff Pharmacist Pager [phone]  Misty Stanley Stillinger 07/11/2016,10:53 AM

## 2016-07-11 NOTE — Progress Notes (Signed)
Name: Ann Potter MRN: [id number] DOB: 1941-09-11    ADMISSION DATE:  11/10/15 CONSULTATION DATE:  10/8  REFERRING MD :  Tyrone SageGerhardt   CHIEF COMPLAINT:  White-out L hemithorax   BRIEF PATIENT DESCRIPTION: 75yo female with multiple medical problems including AFib, asthma, CAD, CHF, severe mitral stenosis, severe TR admitted 9/28 for MV replacement, tricuspid repair and MAZE.  Post op recovery has been slow and now c/b complete white-out of L hemithorax likely thought r/t mucous plugging.  Initially thought to be pleural effusion and pt was sent to IR for pigtail placement, however CT chest revealed only small amt of effusion and dense atx/collapse.  She was tried on chest PT, mucomyst and FOB by CVTS on 10/7 without significant improvement.  PCCM consulted 10/8 to assist.   SIGNIFICANT EVENTS  9/28 MV replacement, Tricuspid repair, MAZE  10/7 FOB  10/9 Repeat FOB 10/10 Repeat FOB in OR 10/12 Repeat FOB, B/L pleurex in OR  STUDIES:  CT chest 10/6>>> Dense atelectasis and collapse of the left upper lobe and lingula. There also is atelectasis of the left lower lobe with mucous plugging in the lower lobe bronchi. There is only a small partially loculated left pleural effusion and also a small right pleural effusion. Decision was made not to place a percutaneous chest tube CXR 10/14>decreased aeration, increased edema. bilat ct's in place   HISTORY OF PRESENT ILLNESS:  75yo female with multiple medical problems including AFib, asthma, CAD, CHF, severe mitral stenosis, severe TR admitted 9/28 for MV replacement, tricuspid repair and MAZE.  Post op recovery has been slow and now c/b complete white-out of L hemithorax likely thought r/t mucous plugging.  Initially thought to be pleural effusion and pt was sent to IR for pigtail placement, however CT chest revealed only small amt of effusion and dense atx/collapse.  She was tried on chest PT, mucomyst and FOB by CVTS on 10/7 without  significant improvement.  PCCM consulted 10/8 to assist.    CBG (last 3)   Recent Labs  07/10/16 1534 07/10/16 2158 07/11/16 0745  GLUCAP 113* 103* 109*      SUBJECTIVE: Follows commands. Remains on 50 % fio2  VITAL SIGNS: Temp:  [97.1 F (36.2 C)-98.4 F (36.9 C)] 98.1 F (36.7 C) (10/14 0400) Pulse Rate:  [63-80] 69 (10/14 0714) Resp:  [15-27] 24 (10/14 0714) BP: (80-188)/(10-147) 95/46 (10/14 0714) SpO2:  [92 %-100 %] 96 % (10/14 0714) FiO2 (%):  [50 %-60 %] 50 % (10/14 0714) Weight:  [251 lb 5.2 oz (114 kg)] 251 lb 5.2 oz (114 kg) (10/14 0500)  PHYSICAL EXAMINATION: General:  Obese female, No distress Neuro:  Off sedation,, follows commands HEENT:  Mm moist, no JVD  Cardiovascular:  S1S2, RRR, No MRG Lungs:  Scattered crackles, No wheeze  Abdomen:  Round, soft, non tender , faint bs Musculoskeletal:  Warm and dry, 1+ BLE edema    Recent Labs Lab 07/13/2016 0350 07/10/16 0325 07/11/16 0430  NA 137 137 133*  K 3.0* 3.3* 3.5  CL 91* 93* 91*  CO2 36* 33* 30  BUN 32* 27* 27*  CREATININE 1.14* 1.14* 1.11*  GLUCOSE 117* 142* 279*    Recent Labs Lab 07/24/2016 0350 07/10/16 0325 07/11/16 0430  HGB 8.5* 11.9* 10.9*  HCT 26.7* 36.6 33.4*  WBC 15.3* 13.6* 14.0*  PLT 332 241 223   Dg Chest Port 1 View  Result Date: 07/10/2016 CLINICAL DATA:  Atelectasis.  Intubated. EXAM: PORTABLE CHEST 1 VIEW COMPARISON:  Chest  radiograph from one day prior. FINDINGS: Endotracheal tube tip is 3.7 cm above the carina. Enteric tube enters stomach with the tip not seen on this image. Right PICC terminates in the low superior vena cava with the tip not well seen on this view. Biapical chest tubes are stable in position. Cardiac valvular prostheses are stable in position. Median sternotomy wires are aligned and intact. Stable cardiomediastinal silhouette with mild cardiomegaly. No pneumothorax. Stable small bilateral pleural effusions. Stable mild pulmonary edema and bibasilar  atelectasis. IMPRESSION: 1. Support structures as described.  No pneumothorax. 2. Stable mild congestive heart failure. 3. Stable small bilateral pleural effusions and bibasilar atelectasis. Electronically Signed   By: Delbert Phenix M.D.   On: 07/10/2016 08:12   Dg Chest Port 1 View  Result Date: 07/11/2016 CLINICAL DATA:  Status post bilateral pleural drainage catheter placement EXAM: PORTABLE CHEST 1 VIEW COMPARISON:  07/06/2016 FINDINGS: AP semi portable view of the chest. Endotracheal tube tip is approximately 4.5 cm superior to the carina. Median sternotomy wires and valvular prosthesis are again noted. Esophageal tube extends below diaphragm, tip is not included. A right upper extremity catheter tip overlies the SVC. Interim placement of bilateral chest drainage catheters, on the right the distal end of the catheter is curled over the right upper lobe. On the left, the distal portion of catheter overlies the medial chest. Interval decrease in size of bilateral pleural effusions with small residuals noted. No definitive pneumothorax identified. Persistent dense consolidation within both lung bases. Cardiomediastinal silhouette is enlarged. IMPRESSION: 1. Interim placement of bilateral chest drainage catheters with decreased bilateral pleural effusions. Improved aeration on the left side. There are residual pleural effusions noted. 2. Persistent dense consolidation within both lung bases. 3. Stable enlarged cardiomediastinal silhouette. 4. Support lines and tubes as described above. Electronically Signed   By: Jasmine Pang M.D.   On: 07/02/2016 21:16   Dg Abd Portable 1v  Result Date: 07/10/2016 CLINICAL DATA:  Evaluate feeding tube EXAM: PORTABLE ABDOMEN - 1 VIEW COMPARISON:  None. FINDINGS: The distal tip of the feeding tube is in the distal stomach. An NG tube terminates in the stomach as well. IMPRESSION: The distal tip of the feeding tube is in the gastric antrum an the distal tip of the NG tube  is in the gastric body. Electronically Signed   By: Gerome Sam III M.D   On: 07/10/2016 19:00    ASSESSMENT / PLAN: S/p MV replacement tricuspid repair and MAZE procedure on 9/29 Atelectasis/collapse with complete opacification L hemithorax. No significant improvement with chest PT, mucomyst, FOB x 3. Underwent 4th bronch procedure 10/12 with B/L pleurex cath placement. CXR with increased edema and decreased aeration  Reccs: Tolerating PSV trials as tolerated. Sedation has been decreased and she is more awake Continue mucomyst, xopenex and atrovent nebs. On vanco, ceftaz for empiric treatment of HCAP Diuresis and pressors per CVTS  Brett Canales Minor ACNP Adolph Pollack PCCM Pager [phone] till 3 pm If no answer page [phone] 07/11/2016, 7:48 AM      `

## 2016-07-11 NOTE — Progress Notes (Signed)
2 Days Post-Op Procedure(s) (LRB): INSERTION PLEURAL DRAINAGE CATHETER (Bilateral) VIDEO BRONCHOSCOPY (N/A) Subjective:  on PS- CPAP for SBT  cortrack in  Place - jevity to start  Objective: Vital signs in last 24 hours: Temp:  [97.1 F (36.2 C)-98.4 F (36.9 C)] 97.9 F (36.6 C) (10/14 0714) Pulse Rate:  [63-296] 69 (10/14 0945) Cardiac Rhythm: Atrial fibrillation (10/14 0800) Resp:  [15-27] 23 (10/14 0945) BP: (80-188)/(10-147) 95/40 (10/14 0945) SpO2:  [91 %-100 %] 93 % (10/14 0945) FiO2 (%):  [50 %-60 %] 50 % (10/14 0800) Weight:  [251 lb 5.2 oz (114 kg)] 251 lb 5.2 oz (114 kg) (10/14 0500)  Hemodynamic parameters for last 24 hours:    Intake/Output from previous day: 10/13 0701 - 10/14 0700 In: 1921.2 [I.V.:1421.2; IV Piggyback:500] Out: 2355 [Urine:2055; Emesis/NG output:50; Chest Tube:250] Intake/Output this shift: Total I/O In: 238.4 [I.V.:128.4; NG/GT:60; IV Piggyback:50] Out: 260 [Urine:260]  Slight decreased BS L base Cxr with mild L base atelectasis  Lab Results:  Recent Labs  07/10/16 0325 07/11/16 0430  WBC 13.6* 14.0*  HGB 11.9* 10.9*  HCT 36.6 33.4*  PLT 241 223   BMET:  Recent Labs  07/10/16 0325 07/11/16 0430  NA 137 133*  K 3.3* 3.5  CL 93* 91*  CO2 33* 30  GLUCOSE 142* 279*  BUN 27* 27*  CREATININE 1.14* 1.11*  CALCIUM 8.3* 7.6*    PT/INR:  Recent Labs  07/11/16 0430  LABPROT 19.2*  INR 1.60   ABG    Component Value Date/Time   PHART 7.447 07/08/2016 1012   HCO3 35.1 (H) 07/08/2016 1012   TCO2 34 07/02/2016 1749   ACIDBASEDEF 3.0 (H) 06/26/2016 0859   O2SAT 94.2 07/08/2016 1012   CBG (last 3)   Recent Labs  07/10/16 1534 07/10/16 2158 07/11/16 0745  GLUCAP 113* 103* 109*    Assessment/Plan: S/P Procedure(s) (LRB): INSERTION PLEURAL DRAINAGE CATHETER (Bilateral) VIDEO BRONCHOSCOPY (N/A) daily pleurx + drain Try to extubate   LOS: 16 days    Kathlee Nationseter Van Trigt III 07/11/2016

## 2016-07-12 ENCOUNTER — Inpatient Hospital Stay (HOSPITAL_COMMUNITY): Payer: Medicare Other

## 2016-07-12 DIAGNOSIS — J9621 Acute and chronic respiratory failure with hypoxia: Secondary | ICD-10-CM

## 2016-07-12 LAB — BASIC METABOLIC PANEL
Anion gap: 10 (ref 5–15)
BUN: 32 mg/dL — AB (ref 6–20)
CALCIUM: 8 mg/dL — AB (ref 8.9–10.3)
CO2: 33 mmol/L — AB (ref 22–32)
CREATININE: 1.16 mg/dL — AB (ref 0.44–1.00)
Chloride: 95 mmol/L — ABNORMAL LOW (ref 101–111)
GFR calc Af Amer: 52 mL/min — ABNORMAL LOW (ref >60)
GFR calc non Af Amer: 45 mL/min — ABNORMAL LOW (ref >60)
GLUCOSE: 145 mg/dL — AB (ref 65–99)
Potassium: 4.1 mmol/L (ref 3.5–5.1)
Sodium: 138 mmol/L (ref 135–145)

## 2016-07-12 LAB — CBC
HCT: 35.4 % — ABNORMAL LOW (ref 36.0–46.0)
Hemoglobin: 11.1 g/dL — ABNORMAL LOW (ref 12.0–15.0)
MCH: 29.6 pg (ref 26.0–34.0)
MCHC: 31.4 g/dL (ref 30.0–36.0)
MCV: 94.4 fL (ref 78.0–100.0)
PLATELETS: 220 10*3/uL (ref 150–400)
RBC: 3.75 MIL/uL — ABNORMAL LOW (ref 3.87–5.11)
RDW: 17.2 % — ABNORMAL HIGH (ref 11.5–15.5)
WBC: 13.8 10*3/uL — ABNORMAL HIGH (ref 4.0–10.5)

## 2016-07-12 LAB — GLUCOSE, CAPILLARY
GLUCOSE-CAPILLARY: 136 mg/dL — AB (ref 65–99)
GLUCOSE-CAPILLARY: 121 mg/dL — AB (ref 65–99)
GLUCOSE-CAPILLARY: 148 mg/dL — AB (ref 65–99)
GLUCOSE-CAPILLARY: 134 mg/dL — AB (ref 65–99)
Glucose-Capillary: 113 mg/dL — ABNORMAL HIGH (ref 65–99)

## 2016-07-12 LAB — ANAEROBIC CULTURE

## 2016-07-12 LAB — CULTURE, RESPIRATORY W GRAM STAIN

## 2016-07-12 LAB — PROCALCITONIN: PROCALCITONIN: 0.78 ng/mL

## 2016-07-12 LAB — PROTIME-INR
INR: 1.63
PROTHROMBIN TIME: 19.5 s — AB (ref 11.4–15.2)

## 2016-07-12 LAB — CULTURE, RESPIRATORY

## 2016-07-12 LAB — VANCOMYCIN, RANDOM: Vancomycin Rm: 18

## 2016-07-12 LAB — PHOSPHORUS: PHOSPHORUS: 2.4 mg/dL — AB (ref 2.5–4.6)

## 2016-07-12 LAB — MAGNESIUM: Magnesium: 1.1 mg/dL — ABNORMAL LOW (ref 1.7–2.4)

## 2016-07-12 MED ORDER — SODIUM CHLORIDE 0.9 % IV SOLN
1250.0000 mg | INTRAVENOUS | Status: DC
  Administered 2016-07-12: 1250 mg via INTRAVENOUS
  Filled 2016-07-12 (×2): qty 1250

## 2016-07-12 MED ORDER — INSULIN ASPART 100 UNIT/ML ~~LOC~~ SOLN
0.0000 [IU] | SUBCUTANEOUS | Status: DC
  Administered 2016-07-12 – 2016-07-13 (×2): 2 [IU] via SUBCUTANEOUS
  Administered 2016-07-13: 3 [IU] via SUBCUTANEOUS
  Administered 2016-07-14: 5 [IU] via SUBCUTANEOUS
  Administered 2016-07-15 (×2): 2 [IU] via SUBCUTANEOUS
  Administered 2016-07-15: 3 [IU] via SUBCUTANEOUS
  Administered 2016-07-16 – 2016-07-22 (×16): 2 [IU] via SUBCUTANEOUS
  Administered 2016-07-22: 3 [IU] via SUBCUTANEOUS
  Administered 2016-07-23: 131 [IU] via SUBCUTANEOUS
  Administered 2016-07-23 – 2016-07-24 (×2): 2 [IU] via SUBCUTANEOUS

## 2016-07-12 MED ORDER — MAGNESIUM SULFATE 4 GM/100ML IV SOLN
4.0000 g | Freq: Once | INTRAVENOUS | Status: AC
  Administered 2016-07-12: 4 g via INTRAVENOUS
  Filled 2016-07-12: qty 100

## 2016-07-12 NOTE — Progress Notes (Signed)
ANTIBIOTIC CONSULT NOTE   Pharmacy Consult for Vancomycin Indication: pneumonia  Allergies  Allergen Reactions  . Relafen [Nabumetone] Other (See Comments)    Bladder pain    Patient Measurements: Height: 5\' 1"  (154.9 cm) Weight: 251 lb 15.8 oz (114.3 kg) IBW/kg (Calculated) : 47.8   Vital Signs: Temp: 98.3 F (36.8 C) (10/15 0310) Temp Source: Oral (10/15 0310) BP: 94/39 (10/15 0915) Pulse Rate: 68 (10/15 0915) Intake/Output from previous day: 10/14 0701 - 10/15 0700 In: 2640.8 [I.V.:1480.8; NG/GT:960; IV Piggyback:200] Out: 1470 [Urine:1270; Chest Tube:200] Intake/Output from this shift: Total I/O In: 318.4 [I.V.:128.4; NG/GT:190] Out: 470 [Urine:195; Chest Tube:275]  Labs:  Recent Labs  07/10/16 0325 07/11/16 0430 07/12/16 0400  WBC 13.6* 14.0* 13.8*  HGB 11.9* 10.9* 11.1*  PLT 241 223 220  CREATININE 1.14* 1.11* 1.16*   Estimated Creatinine Clearance: 48.5 mL/min (by C-G formula based on SCr of 1.16 mg/dL (H)).  Recent Labs  07/10/16 1430 07/11/16 0432 07/12/16 0500  VANCOTROUGH 32*  --   --   VANCORANDOM  --  25 18     Microbiology:   Medical History: Past Medical History:  Diagnosis Date  . Anxiety   . Arthritis    "feet & legs" (04/16/2016)  . Asthma   . Atrial fibrillation (HCC)   . Atrial fibrillation, chronic (HCC) 01/25/2012   Status post DCCV, June 2013  . CAD (coronary artery disease)    Nonobstructive, by catheterization 4/13  . CHF (congestive heart failure) (HCC)   . Chronic diastolic congestive heart failure (HCC) 01/25/2012  . Chronic lower back pain   . Chronic renal insufficiency, stage III (moderate) 01/25/2012  . Chronic venous insufficiency   . COPD (chronic obstructive pulmonary disease) (HCC)   . Heart murmur   . HLD (hyperlipidemia)   . HTN (hypertension)   . Hypothyroidism   . Lower extremity edema   . Macular degeneration   . Mitral stenosis    Mild, by catheterization 4/13  . Morbid obesity (HCC)   .  Myocardial infarction 2007  . Pulmonary hypertension    Moderate, by catheterization 4/13  . Rheumatic fever during childhood    Age [age]  . Rheumatic heart disease   . S/P Maze operation for atrial fibrillation 04/10/16   Complete bilateral atrial lesion set using cryothermy and bipolar radiofrequency ablation with clipping of LA appendage  . S/P mitral valve replacement with bioprosthetic valve 04/10/16   [facility] Mit[facility] bovine pericardial tissue valve  . S/P tricuspid valve repair 04/10/16   26 mm Edwards mc3 ring annuloplasty  . Tricuspid regurgitation   . Worries     Assessment:  ID: day # 9 Ceftaz and # 7 Vanc for new pulm process/HCAP coverage. Small right pleural effusion; 100% collapse of left lung d/t secretions and poor pulm toilet. Repeat bronch done 10/9, then to OR on 10/10 for repeat bronch. For CTA 10/11 to eval pleural effusions. - Afeb, WBC 14 remaining about the same..Scr 1.11 down, PCT 0.68>0.78 today  Antimicrobials this admission:  Cefuroxime 9/28>>9/30 Ceftaz 10/6 >> Vanc 9/28 x 2 (OR day). Resume 10/9>>  Dose adjustments this admission:  10/13: VT=32 on 1250mg /24h. Hold Vanco 10/14: VR=25 HOLD 10/15: VR=18 resume at 1250mg /48h (UOP dropping)  Microbiology results:  10/10 acid fast smear (BAL) - negative 10/10 BAL specimen B  - no organisms seen 10/10 BAL specimen C - no organisms seen 10/9 bronchial washings - 10K normal flora 10/9 TA - normal flora 10/6 TA -  no organisms seen 9/25 MRSA PCR neg   Goal of Therapy:  Vancomycin trough level 15-20 mcg/ml  Plan:  Resume IV Vancomycin 1250mg  IV q48h (Scr improved but UOP dropping over the last few days) Trough after 3-5 more doses   Ann Potter S. Merilynn Finland, PharmD, BCPS Clinical Staff Pharmacist Pager [phone]  Misty Stanley Stillinger 07/12/2016,10:16 AM

## 2016-07-12 NOTE — Progress Notes (Signed)
Name: Ann Potter MRN: [id number] DOB: 1941/01/14    ADMISSION DATE:  06/02/2016 CONSULTATION DATE:  10/8  REFERRING MD :  Tyrone Sage   CHIEF COMPLAINT:  White-out L hemithorax   BRIEF PATIENT DESCRIPTION: 75yo female with multiple medical problems including AFib, asthma, CAD, CHF, severe mitral stenosis, severe TR admitted 9/28 for MV replacement, tricuspid repair and MAZE.  Post op recovery has been slow and now c/b complete white-out of L hemithorax likely thought r/t mucous plugging.  Initially thought to be pleural effusion and pt was sent to IR for pigtail placement, however CT chest revealed only small amt of effusion and dense atx/collapse.  She was tried on chest PT, mucomyst and FOB by CVTS on 10/7 without significant improvement.  PCCM consulted 10/8 to assist.   SIGNIFICANT EVENTS  9/28 MV replacement, Tricuspid repair, MAZE  10/7 FOB  10/9 Repeat FOB 10/10 Repeat FOB in OR 10/12 Repeat FOB, B/L pleurex in OR  STUDIES:  CT chest 10/6>>> Dense atelectasis and collapse of the left upper lobe and lingula. There also is atelectasis of the left lower lobe with mucous plugging in the lower lobe bronchi. There is only a small partially loculated left pleural effusion and also a small right pleural effusion. Decision was made not to place a percutaneous chest tube CXR 10/15>decreased aeration, increased edema. bilat ct's in place   HISTORY OF PRESENT ILLNESS:  75yo female with multiple medical problems including AFib, asthma, CAD, CHF, severe mitral stenosis, severe TR admitted 9/28 for MV replacement, tricuspid repair and MAZE.  Post op recovery has been slow and now c/b complete white-out of L hemithorax likely thought r/t mucous plugging.  Initially thought to be pleural effusion and pt was sent to IR for pigtail placement, however CT chest revealed only small amt of effusion and dense atx/collapse.  She was tried on chest PT, mucomyst and FOB by CVTS on 10/7 without  significant improvement.  PCCM consulted 10/8 to assist.    CBG (last 3)   Recent Labs  07/11/16 1657 07/11/16 2153 07/12/16 0917  GLUCAP 135* 122* 113*      SUBJECTIVE: Follows commands. Remains on 50 % fio2 and 2 pressors  VITAL SIGNS: Temp:  [98.1 F (36.7 C)-98.9 F (37.2 C)] 98.3 F (36.8 C) (10/15 0310) Pulse Rate:  [62-75] 68 (10/15 0915) Resp:  [12-30] 15 (10/15 0915) BP: (66-116)/(22-81) 94/39 (10/15 0915) SpO2:  [92 %-100 %] 100 % (10/15 0915) FiO2 (%):  [50 %] 50 % (10/15 0800) Weight:  [251 lb 15.8 oz (114.3 kg)] 251 lb 15.8 oz (114.3 kg) (10/15 0412)  PHYSICAL EXAMINATION: General:  Obese female, No distress Neuro: follows commands. 10/15 sight sedation for chest pain during draining. Left ct 75 rt 200 HEENT:  Mm moist, no JVD  Cardiovascular:  S1S2, RRR, No MRG Lungs:  Scattered crackles, No wheeze  Abdomen:  Round, soft, non tender , faint bs Musculoskeletal:  Warm and dry, 1+ BLE edema    Recent Labs Lab 07/10/16 0325 07/11/16 0430 07/12/16 0400  NA 137 133* 138  K 3.3* 3.5 4.1  CL 93* 91* 95*  CO2 33* 30 33*  BUN 27* 27* 32*  CREATININE 1.14* 1.11* 1.16*  GLUCOSE 142* 279* 145*    Recent Labs Lab 07/10/16 0325 07/11/16 0430 07/12/16 0400  HGB 11.9* 10.9* 11.1*  HCT 36.6 33.4* 35.4*  WBC 13.6* 14.0* 13.8*  PLT 241 223 220   Dg Chest Port 1 View  Result Date: 07/12/2016 CLINICAL DATA:  Evaluate ETT EXAM: PORTABLE CHEST 1 VIEW COMPARISON:  July 11, 2016 FINDINGS: It is difficult to see the distal tip of the ET tube is due to overlying support apparatus. However, it appears to be in good position. The remainder of the support apparatus is stable. No pneumothorax. A left chest tube remains in place. The small effusion on the right is unchanged. Left retrocardiac opacity may be slightly improved. Worsening aeration of the left upper lobe. IMPRESSION: 1. Stable support apparatus. 2. Worsening aeration of the left upper lobe. 3. Stable  small right effusion and left retrocardiac opacity. Electronically Signed   By: Gerome Samavid  Williams III M.D   On: 07/12/2016 07:24   Dg Chest Port 1 View  Result Date: 07/11/2016 CLINICAL DATA:  Atelectasis. EXAM: PORTABLE CHEST 1 VIEW COMPARISON:  07/10/2016. FINDINGS: 0620 hours. Endotracheal tube tip is 5.3 cm above the base of the carina. Right PICC line tip overlies the mid SVC level. The NG tube passes into the stomach although the distal tip position is not included on the film. A feeding tube passes into the stomach although the distal tip position is not included on the film. Bilateral chest tubes again noted. The cardio pericardial silhouette is enlarged. Vascular congestion with bibasilar collapse/consolidation, left greater than right. IMPRESSION: Cardiomegaly with vascular congestion and left greater than right basilar atelectasis/ infiltrate. Small bilateral pleural effusions. Electronically Signed   By: Kennith CenterEric  Mansell M.D.   On: 07/11/2016 09:34   Dg Abd Portable 1v  Result Date: 07/10/2016 CLINICAL DATA:  Evaluate feeding tube EXAM: PORTABLE ABDOMEN - 1 VIEW COMPARISON:  None. FINDINGS: The distal tip of the feeding tube is in the distal stomach. An NG tube terminates in the stomach as well. IMPRESSION: The distal tip of the feeding tube is in the gastric antrum an the distal tip of the NG tube is in the gastric body. Electronically Signed   By: Gerome Samavid  Williams III M.D   On: 07/10/2016 19:00    ASSESSMENT / PLAN: S/p MV replacement tricuspid repair and MAZE procedure on 9/29 Atelectasis/collapse with complete opacification L hemithorax. No significant improvement with chest PT, mucomyst, FOB x 3. Underwent 4th bronch procedure 10/12 with B/L pleurex cath placement. CXR with increased edema and decreased aeration  Reccs: Tolerating PSV trials as tolerated. Weaned all day 10/14, not extubated yet. #$ ETT Sedation has been decreased and she is more awake Continue , xopenex and atrovent  nebs. On vanco, ceftaz for empiric treatment of HCAP Diuresis and pressors per CVTS Drain pleurex caths as needed per CVTS  Brett CanalesSteve Renji Berwick ACNP Adolph PollackLe Bauer PCCM Pager [phone] till 3 pm If no answer page [phone] 07/12/2016, 10:22 AM      `

## 2016-07-12 NOTE — Progress Notes (Addendum)
3 Days Post-Op Procedure(s) (LRB): INSERTION PLEURAL DRAINAGE CATHETER (Bilateral) VIDEO BRONCHOSCOPY (N/A) Subjective: Remains on pressure support weaning trials Chest x-ray today with some volume loss at the left base Pleurx catheter drainage each day approximately 250-300 cc  Objective: Vital signs in last 24 hours: Temp:  [98.1 F (36.7 C)-98.9 F (37.2 C)] 98.9 F (37.2 C) (10/15 1502) Pulse Rate:  [62-74] 63 (10/15 1900) Cardiac Rhythm: Atrial fibrillation (10/15 1600) Resp:  [12-28] 20 (10/15 1900) BP: (66-116)/(22-63) 97/41 (10/15 1900) SpO2:  [95 %-100 %] 98 % (10/15 1900) FiO2 (%):  [40 %-50 %] 40 % (10/15 1600) Weight:  [251 lb 15.8 oz (114.3 kg)] 251 lb 15.8 oz (114.3 kg) (10/15 0412)  Hemodynamic parameters for last 24 hours:    Intake/Output from previous day: 10/14 0701 - 10/15 0700 In: 2640.8 [I.V.:1480.8; NG/GT:960; IV Piggyback:200] Out: 1470 [Urine:1270; Chest Tube:200] Intake/Output this shift: No intake/output data recorded.  Diminished breath sounds on left Resting comfortably on ventilator  Lab Results:  Recent Labs  07/11/16 0430 07/12/16 0400  WBC 14.0* 13.8*  HGB 10.9* 11.1*  HCT 33.4* 35.4*  PLT 223 220   BMET:  Recent Labs  07/11/16 0430 07/12/16 0400  NA 133* 138  K 3.5 4.1  CL 91* 95*  CO2 30 33*  GLUCOSE 279* 145*  BUN 27* 32*  CREATININE 1.11* 1.16*  CALCIUM 7.6* 8.0*    PT/INR:  Recent Labs  07/12/16 0400  LABPROT 19.5*  INR 1.63   ABG    Component Value Date/Time   PHART 7.451 (H) 07/11/2016 1205   HCO3 33.9 (H) 07/11/2016 1205   TCO2 34 07/02/2016 1749   ACIDBASEDEF 3.0 (H) 06/26/2016 0859   O2SAT 95.7 07/11/2016 1205   CBG (last 3)   Recent Labs  07/12/16 0917 07/12/16 1246 07/12/16 1608  GLUCAP 113* 136* 121*    Assessment/Plan: S/P Procedure(s) (LRB): INSERTION PLEURAL DRAINAGE CATHETER (Bilateral) VIDEO BRONCHOSCOPY (N/A) Continue current care Probably need trach Last sputum culture  negative except for a few candida  LOS: 17 days    Kathlee Nationseter Van Trigt III 07/12/2016

## 2016-07-13 ENCOUNTER — Inpatient Hospital Stay (HOSPITAL_COMMUNITY): Payer: Medicare Other

## 2016-07-13 LAB — BASIC METABOLIC PANEL
Anion gap: 9 (ref 5–15)
Anion gap: 10 (ref 5–15)
BUN: 37 mg/dL — AB (ref 6–20)
BUN: 38 mg/dL — AB (ref 6–20)
CALCIUM: 7.8 mg/dL — AB (ref 8.9–10.3)
CHLORIDE: 96 mmol/L — AB (ref 101–111)
CO2: 33 mmol/L — AB (ref 22–32)
CO2: 34 mmol/L — ABNORMAL HIGH (ref 22–32)
CREATININE: 1.12 mg/dL — AB (ref 0.44–1.00)
Calcium: 7.9 mg/dL — ABNORMAL LOW (ref 8.9–10.3)
Chloride: 94 mmol/L — ABNORMAL LOW (ref 101–111)
Creatinine, Ser: 1.16 mg/dL — ABNORMAL HIGH (ref 0.44–1.00)
GFR calc Af Amer: 54 mL/min — ABNORMAL LOW (ref >60)
GFR calc Af Amer: 52 mL/min — ABNORMAL LOW (ref >60)
GFR calc non Af Amer: 46 mL/min — ABNORMAL LOW (ref >60)
GFR, EST NON AFRICAN AMERICAN: 45 mL/min — AB (ref >60)
GLUCOSE: 95 mg/dL (ref 65–99)
Glucose, Bld: 155 mg/dL — ABNORMAL HIGH (ref 65–99)
Potassium: 4.2 mmol/L (ref 3.5–5.1)
Potassium: 4.1 mmol/L (ref 3.5–5.1)
SODIUM: 138 mmol/L (ref 135–145)
Sodium: 138 mmol/L (ref 135–145)

## 2016-07-13 LAB — GLUCOSE, CAPILLARY
Glucose-Capillary: 167 mg/dL — ABNORMAL HIGH (ref 65–99)
Glucose-Capillary: 120 mg/dL — ABNORMAL HIGH (ref 65–99)
Glucose-Capillary: 121 mg/dL — ABNORMAL HIGH (ref 65–99)
Glucose-Capillary: 101 mg/dL — ABNORMAL HIGH (ref 65–99)
Glucose-Capillary: 106 mg/dL — ABNORMAL HIGH (ref 65–99)

## 2016-07-13 LAB — CBC
HCT: 34.1 % — ABNORMAL LOW (ref 36.0–46.0)
Hemoglobin: 10.7 g/dL — ABNORMAL LOW (ref 12.0–15.0)
MCH: 30 pg (ref 26.0–34.0)
MCHC: 31.4 g/dL (ref 30.0–36.0)
MCV: 95.5 fL (ref 78.0–100.0)
PLATELETS: 197 10*3/uL (ref 150–400)
RBC: 3.57 MIL/uL — ABNORMAL LOW (ref 3.87–5.11)
RDW: 16.9 % — AB (ref 11.5–15.5)
WBC: 11.9 10*3/uL — ABNORMAL HIGH (ref 4.0–10.5)

## 2016-07-13 LAB — MAGNESIUM: Magnesium: 2.2 mg/dL (ref 1.7–2.4)

## 2016-07-13 LAB — PROTIME-INR
INR: 1.59
Prothrombin Time: 19.1 seconds — ABNORMAL HIGH (ref 11.4–15.2)

## 2016-07-13 MED ORDER — POTASSIUM CHLORIDE CRYS ER 20 MEQ PO TBCR
40.0000 meq | EXTENDED_RELEASE_TABLET | Freq: Three times a day (TID) | ORAL | Status: DC
  Administered 2016-07-13 – 2016-07-14 (×5): 40 meq via ORAL
  Filled 2016-07-13 (×6): qty 2

## 2016-07-13 MED ORDER — WARFARIN SODIUM 5 MG PO TABS
5.0000 mg | ORAL_TABLET | Freq: Every day | ORAL | Status: DC
  Administered 2016-07-13: 5 mg
  Filled 2016-07-13: qty 1

## 2016-07-13 MED ORDER — GERHARDT'S BUTT CREAM
TOPICAL_CREAM | CUTANEOUS | Status: DC | PRN
  Administered 2016-07-13 – 2016-07-22 (×2): via TOPICAL
  Filled 2016-07-13: qty 1

## 2016-07-13 MED ORDER — ORAL CARE MOUTH RINSE
15.0000 mL | Freq: Two times a day (BID) | OROMUCOSAL | Status: DC
  Administered 2016-07-13 – 2016-07-24 (×15): 15 mL via OROMUCOSAL

## 2016-07-13 MED ORDER — ENOXAPARIN SODIUM 100 MG/ML ~~LOC~~ SOLN
100.0000 mg | Freq: Two times a day (BID) | SUBCUTANEOUS | Status: DC
  Administered 2016-07-13 – 2016-07-14 (×2): 100 mg via SUBCUTANEOUS
  Filled 2016-07-13 (×2): qty 1

## 2016-07-13 MED ORDER — SODIUM CHLORIDE 0.9 % IV SOLN: INTRAVENOUS | Status: DC

## 2016-07-13 NOTE — Progress Notes (Addendum)
[address]       Ann Potter [address]             [phone]        CARDIOTHORACIC SURGERY PROGRESS NOTE  R18Days Post-Op Procedure(s) (LRB): MITRAL VALVE (MV) REPLACEMENT (N/A) TRICUSPID VALVE REPAIR (N/A) MAZE (N/A) TRANSESOPHAGEAL ECHOCARDIOGRAM (TEE) (N/A)   R4 Days Post-Op Procedure(s) (LRB): INSERTION PLEURAL DRAINAGE CATHETER (Bilateral) VIDEO BRONCHOSCOPY (N/A)  Subjective: Awake and alert on vent.  By report only small amount tan airway secretions  Objective: Vital signs: BP Readings from Last 1 Encounters:  07/13/16 (!) 92/38   Pulse Readings from Last 1 Encounters:  07/13/16 69   Resp Readings from Last 1 Encounters:  07/13/16 (!) 22   Temp Readings from Last 1 Encounters:  07/13/16 97.5 F (36.4 C) (Oral)    Hemodynamics:    Physical Exam:  Rhythm:   junctional  Breath sounds: clear  Heart sounds:  RRR  Incisions:  Clean and dry  Abdomen:  Soft, non-distended, non-tender  Extremities:  Warm, well-perfused   Intake/Output from previous day: 10/15 0701 - 10/16 0700 In: 3910.8 [I.V.:1380.8; NG/GT:2030; IV Piggyback:500] Out: 2000 [Urine:1525; Emesis/NG output:200; Chest Tube:275] Intake/Output this shift: Total I/O In: 124.2 [I.V.:54.2; NG/GT:70] Out: 125 [Urine:125]  Lab Results:  CBC: Recent Labs  07/12/16 0400 07/13/16 0409  WBC 13.8* 11.9*  HGB 11.1* 10.7*  HCT 35.4* 34.1*  PLT 220 197    BMET:  Recent Labs  07/12/16 0400 07/13/16 0409  NA 138 138  K 4.1 4.2  CL 95* 96*  CO2 33* 33*  GLUCOSE 145* 155*  BUN 32* 37*  CREATININE 1.16* 1.12*  CALCIUM 8.0* 7.9*     PT/INR:   Recent Labs  07/13/16 0409  LABPROT 19.1*  INR 1.59    CBG (last 3)   Recent Labs  07/12/16 2313 07/13/16 0414 07/13/16 0728  GLUCAP 134* 167* 120*    ABG    Component Value Date/Time   PHART 7.451 (H) 07/11/2016 1205   PCO2ART 49.1 (H) 07/11/2016 1205   PO2ART 78.2 (L) 07/11/2016 1205   HCO3 33.9 (H)  07/11/2016 1205   TCO2 34 07/02/2016 1749   ACIDBASEDEF 3.0 (H) 06/26/2016 0859   O2SAT 95.7 07/11/2016 1205    CXR: PORTABLE CHEST 1 VIEW  COMPARISON:  07/12/2016  FINDINGS: Endotracheal tube tip is only about 6 mm above the carina. This is getting close to malpositioning, and I would recommend retracting 2.5 cm.  Nasogastric tube enters the stomach. Feeding tube enters the stomach.  Bilateral chest tubes in place. Left atrial appendage clamp and prosthetic valve, likely a mitral valve, observed. Atherosclerotic aortic arch.  No appreciable pneumothorax. Increasing airspace opacity the at the lung bases. Indistinct costophrenic angles, small pleural effusions not excluded. Indistinct pulmonary vasculature.  Atherosclerotic aortic arch.  IMPRESSION: 1. Endotracheal tube tip is low in position and borderline malpositioned, about 6 mm above the carina. I recommend retraction of the endotracheal tube by 2.5 cm. 2. Increasing airspace opacity at both lung bases, possibly from atelectasis, pneumonia, and/or layering pleural effusions. Mild to moderate cardiomegaly. 3. Atherosclerotic aortic arch. These results will be called to the ordering clinician or representative by the Radiologist Assistant, and communication documented in the PACS or zVision Dashboard.   Electronically Signed   By: Gaylyn Rong M.D.   On: 07/13/2016 07:50  Assessment/Plan:  [id number] original surgery Acute on chronic respiratory failure, reintubated and vent-dependent due to refractory complete collapse of  left lung secondary to airway secretions and poor pulmonary toilet - CXR looks remarkably good and oxygenation stable HCAP - culture results from FOB performed 10/7 reported "normal flora" repeat BAL specimens from 10/9 and 10/10 also reported "normal respiratory flora"- day #9Fortaz day #7Vanc Maintaining stablerhythm andHR and BP although now on neo and dopamine for BP  support due to hypotension associated with intravenous sedation Permanent atrial fibrillation now s/p maze procedure - HR stable and well controlled Acute on chronic diastolic CHF with expected post-op volume excess,I/O's positive last 2 days despite diuresing well on low dose lasix drip, weight still reportedly 9 lbs above preop baseline Expected post op acute blood loss anemia, Hgb up to10.7this morning Leukocytosis - WBC trending down - presumably due to HCAP Longstanding rheumatic heart disease  Pulmonary hypertension Chronic kidney disease stage III, creatinine stable Morbid obesity Physical deconditioning - severe Protein-depleted malnutrition - tolerating tube feeds Chronic venous insufficiency   Continue SBT's and consider trial extubation  Will need trach if unable to extubate soon  Plan per Pulm/CCM team  Increase Coumadin  Purcell Nailslarence H Owen, MD 07/13/2016 9:01 AM

## 2016-07-13 NOTE — Progress Notes (Signed)
PT Cancellation Note  Patient Details Name: Ann PoeGloria Bojanowski MRN: [id number] DOB: Mar 29, 1941   Cancelled Treatment:    Reason Eval/Treat Not Completed: Medical issues which prohibited therapy (pt remains intubated with plans for extubation today. Will attempt next date)   Delorse Lekabor, Terrie Haring Beth 07/13/2016, 9:17 AM Delaney MeigsMaija Tabor Michall Noffke, PT [phone]

## 2016-07-13 NOTE — Progress Notes (Signed)
eLink Physician-Brief Progress Note Patient Name: Sherle PoeGloria Winkles DOB: [birth year] MRN: [id number]   Date of Service  07/13/2016  HPI/Events of Note  Camera check postextubation at 10:48 AM. Patient currently sleeping with eyes closed on nonrebreather mask. Saturating 100%. Respiratory rate 22. Heart rate 70. BP 82/33.  Patient's oxygen requirement quickly escalated from 4 L/m to nonrebreather mask postextubation. Require BiPAP until approximately 5:30 PM when patient was transitioned to nonrebreather mask. Currently has mildly increased work of breathing.   eICU Interventions  Spoke with bedside nurse and recommended transitioning back to BiPAP mask for the rest of the evening.      Intervention Category Major Interventions: Respiratory failure - evaluation and management  Lawanda CousinsJennings Landy Mace 07/13/2016, 9:55 PM

## 2016-07-13 NOTE — Care Management Important Message (Signed)
Important Message  Patient Details  Name: Sherle PoeGloria Thrun MRN: [id number] Date of Birth: 1941-06-11   Medicare Important Message Given:  Yes    Pharaoh Pio [name] 07/13/2016, 1:28 PM

## 2016-07-13 NOTE — Progress Notes (Signed)
ANTICOAGULATION CONSULT NOTE - Initial Consult  Pharmacy Consult for Lovenox Indication: atrial fibrillation  Allergies  Allergen Reactions  . Relafen [Nabumetone] Other (See Comments)    Bladder pain    Patient Measurements: Height: 5\' 1"  (154.9 cm) Weight: 248 lb 10.9 oz (112.8 kg) IBW/kg (Calculated) : 47.8  Vital Signs: Temp: 98.5 F (36.9 C) (10/16 1123) Temp Source: Oral (10/16 1123) BP: 90/57 (10/16 1300) Pulse Rate: 66 (10/16 1300)  Labs:  Recent Labs  07/11/16 0430 07/12/16 0400 07/13/16 0409  HGB 10.9* 11.1* 10.7*  HCT 33.4* 35.4* 34.1*  PLT 223 220 197  LABPROT 19.2* 19.5* 19.1*  INR 1.60 1.63 1.59  CREATININE 1.11* 1.16* 1.12*    Estimated Creatinine Clearance: 49.8 mL/min (by C-G formula based on SCr of 1.12 mg/dL (H)).   Medical History: Past Medical History:  Diagnosis Date  . Anxiety   . Arthritis    "feet & legs" (04/16/2016)  . Asthma   . Atrial fibrillation (HCC)   . Atrial fibrillation, chronic (HCC) 01/25/2012   Status post DCCV, June 2013  . CAD (coronary artery disease)    Nonobstructive, by catheterization 4/13  . CHF (congestive heart failure) (HCC)   . Chronic diastolic congestive heart failure (HCC) 01/25/2012  . Chronic lower back pain   . Chronic renal insufficiency, stage III (moderate) 01/25/2012  . Chronic venous insufficiency   . COPD (chronic obstructive pulmonary disease) (HCC)   . Heart murmur   . HLD (hyperlipidemia)   . HTN (hypertension)   . Hypothyroidism   . Lower extremity edema   . Macular degeneration   . Mitral stenosis    Mild, by catheterization 4/13  . Morbid obesity (HCC)   . Myocardial infarction 2007  . Pulmonary hypertension    Moderate, by catheterization 4/13  . Rheumatic fever during childhood    Age [age]  . Rheumatic heart disease   . S/P Maze operation for atrial fibrillation 06/16/2016   Complete bilateral atrial lesion set using cryothermy and bipolar radiofrequency ablation with clipping  of LA appendage  . S/P mitral valve replacement with bioprosthetic valve 06/24/2016   [facility] Mitral bovine pericardial tissue valve  . 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 Tricuspid regurgitation   . Worries     Assessment: 75 year old female s/p MVR and tricuspid valve repair 9/28 to begin Lovenox for ongoing Afib, also continues on Coumadin  Goal of Therapy:  Anti-Xa level 0.6-1 units/ml 4hrs after LMWH dose given Monitor platelets by anticoagulation protocol: Yes   Plan:  Lovenox 100 mg sq Q 12 hours  (6 AM / 6 PM schedule) Watch CBC  Thank you Okey RegalLisa Olene Godfrey, PharmD [phone] 07/13/2016,1:23 PM

## 2016-07-13 NOTE — Progress Notes (Signed)
Nutrition Follow Up  DOCUMENTATION CODES:   Morbid obesity  INTERVENTION:    PO diet advancement vs TF re-initiation   NUTRITION DIAGNOSIS:   Inadequate oral intake related to inability to eat as evidenced by NPO status, ongoing  GOAL:   Patient will meet greater than or equal to 90% of their needs, currently unmet  MONITOR:   Diet advancement, PO intake, Labs, Weight trends, Skin, I & O's,   ASSESSMENT:   75 yo Female admitted 05/29/2016 due to 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  Patient s/p procedures 10/10: VIDEO BRONCHOSCOPY INTUBATION REMOVAL OF SECRETIONS   Pt with atelectasis/collapse with complete opacification L hemithorax. CORTRAK small bore feeding tube placed 10/13. Jevity 1.2 formula initiated 10/14 >> currently off for extubation. Propofol discontinued. Labs and medications reviewed.   Diet Order:  Diet NPO time specified  Skin:  Reviewed, no issues   CBG (last 3)   Recent Labs  07/12/16 2313 07/13/16 0414 07/13/16 0728  GLUCAP 134* 167* 120*   Last BM:  10/15  Height:   Ht Readings from Last 1 Encounters:  07/03/16 5\' 1"  (1.549 m)    Weight:   Wt Readings from Last 1 Encounters:  07/13/16 248 lb 10.9 oz (112.8 kg)    Ideal Body Weight:  45.4 kg  BMI:  Body mass index is 46.99 kg/m.  Re-estimated Nutritional Needs:   Kcal:  1600-1800  Protein:  80-90 gm  Fluid:  1.6-1.8 L  EDUCATION NEEDS:   No education needs identified at this time  Ann ChattersKatie Wilberta Potter, RD, LDN Pager #: [phone] After-Hours Pager #: [phone]

## 2016-07-13 NOTE — Procedures (Signed)
Extubation Procedure Note  Patient Details:   Name: Sherle PoeGloria Balducci DOB: 02-Aug-1941 MRN: [id number]   Airway Documentation:     Evaluation  O2 sats: stable throughout Complications: No apparent complications Patient did tolerate procedure well. Bilateral Breath Sounds: Rhonchi   Yes   Pt. Was extubated to a 4L [place] without any complications, dyspnea or stridor noted. Pt. Was instructed on IS x 10, highest goal reached was 500 mL. Pt. Was also instructed on flutter valve x 10 with good effort.   Sieanna Vanstone, Margaretmary Dysshley L 07/13/2016, 10:48 AM

## 2016-07-13 NOTE — Progress Notes (Signed)
PULMONARY / CRITICAL CARE MEDICINE   Name: Ann PoeGloria Potter MRN: [id number] DOB: November 12, 1940    ADMISSION DATE:  05/31/2016 CONSULTATION DATE:  07/05/2016  REFERRING MD:  Dr. Barry Dieneswens, TCTS  CHIEF COMPLAINT:  Short of breath  SUBJECTIVE:  Denies chest pain.  VITAL SIGNS: BP (!) 101/44   Pulse 69   Temp 97.5 F (36.4 C) (Oral)   Resp 20   Ht 5\' 1"  (1.549 m)   Wt 248 lb 10.9 oz (112.8 kg)   SpO2 99%   BMI 46.99 kg/m   VENTILATOR SETTINGS: Vent Mode: PRVC FiO2 (%):  [40 %-50 %] 40 % Set Rate:  [15 bmp] 15 bmp Vt Set:  [390 mL] 390 mL PEEP:  [5 cmH20] 5 cmH20 Pressure Support:  [8 cmH20] 8 cmH20 Plateau Pressure:  [14 cmH20-17 cmH20] 14 cmH20  INTAKE / OUTPUT: I/O last 3 completed shifts: In: 5291.2 [I.V.:2091.2; NG/GT:2600; IV Piggyback:600] Out: 2475 [Urine:2000; Emesis/NG output:200; Chest Tube:275]  PHYSICAL EXAMINATION: General:  alert Neuro:  RASS 0 HEENT:  ETT in place Cardiovascular:  Regular, 2/6 SM, wound site clean Lungs:  Basilar crackles, no wheeze Abdomen:  Soft, non tender Musculoskeletal:  1+ edema Skin:  No rashes  LABS:  BMET  Recent Labs Lab 07/11/16 0430 07/12/16 0400 07/13/16 0409  NA 133* 138 138  K 3.5 4.1 4.2  CL 91* 95* 96*  CO2 30 33* 33*  BUN 27* 32* 37*  CREATININE 1.11* 1.16* 1.12*  GLUCOSE 279* 145* 155*    Electrolytes  Recent Labs Lab 07/11/16 0430 07/12/16 0400 07/13/16 0409  CALCIUM 7.6* 8.0* 7.9*  MG  --  1.1* 2.2  PHOS  --  2.4*  --     CBC  Recent Labs Lab 07/11/16 0430 07/12/16 0400 07/13/16 0409  WBC 14.0* 13.8* 11.9*  HGB 10.9* 11.1* 10.7*  HCT 33.4* 35.4* 34.1*  PLT 223 220 197    Coag's  Recent Labs Lab 07/11/16 0430 07/12/16 0400 07/13/16 0409  INR 1.60 1.63 1.59    Sepsis Markers  Recent Labs Lab 07/10/16 1200 07/11/16 0430 07/12/16 0400  PROCALCITON 0.68 0.68 0.78    ABG  Recent Labs Lab 07/15/2016 1430 07/08/16 1012 07/11/16 1205  PHART 7.384 7.447 7.451*   PCO2ART 62.7* 51.6* 49.1*  PO2ART 82.3* 70.8* 78.2*    Liver Enzymes  Recent Labs Lab 07/10/16 0325  AST 28  ALT 17  ALKPHOS 108  BILITOT 1.7*  ALBUMIN 2.3*    Cardiac Enzymes No results for input(s): TROPONINI, PROBNP in the last 168 hours.  Glucose  Recent Labs Lab 07/12/16 1246 07/12/16 1608 07/12/16 1927 07/12/16 2313 07/13/16 0414 07/13/16 0728  GLUCAP 136* 121* 148* 134* 167* 120*    Imaging Dg Chest Port 1 View  Result Date: 07/13/2016 CLINICAL DATA:  Respiratory failure EXAM: PORTABLE CHEST 1 VIEW COMPARISON:  07/12/2016 FINDINGS: Endotracheal tube tip is only about 6 mm above the carina. This is getting close to malpositioning, and I would recommend retracting 2.5 cm. Nasogastric tube enters the stomach. Feeding tube enters the stomach. Bilateral chest tubes in place. Left atrial appendage clamp and prosthetic valve, likely a mitral valve, observed. Atherosclerotic aortic arch. No appreciable pneumothorax. Increasing airspace opacity the at the lung bases. Indistinct costophrenic angles, small pleural effusions not excluded. Indistinct pulmonary vasculature. Atherosclerotic aortic arch. IMPRESSION: 1. Endotracheal tube tip is low in position and borderline malpositioned, about 6 mm above the carina. I recommend retraction of the endotracheal tube by 2.5 cm. 2. Increasing airspace opacity at  both lung bases, possibly from atelectasis, pneumonia, and/or layering pleural effusions. Mild to moderate cardiomegaly. 3. Atherosclerotic aortic arch. These results will be called to the ordering clinician or representative by the Radiologist Assistant, and communication documented in the PACS or zVision Dashboard. Electronically Signed   By: Gaylyn Rong M.D.   On: 07/13/2016 07:50     STUDIES:  CT chest 10/06 >> Lt sided ATX/collapse with mucus plug CT angio chest 10/11 >> b/l pleural effusions  CULTURES: Sputum 10/06 >> oral flora BAL 10/09 >> oral flora BAL  10/10 >> oral flora Sputum 10/12 >> few Candida albicans  ANTIBIOTICS: Elita Quick 10/06 >> Vancomycin 10/09 >> 10/12 Vancomycin 10/15 >>   SIGNIFICANT EVENTS: 09/28 Admit; MV replacement, TV repair, MAZE 10/06 Atelectasis  10/07 Bronchoscopy at bedside 10/09 Bronchoscopy at bedside 10/10 To OR for ETT and bronchoscopy 10/12 b/l pleurx catheter placement, bronchoscopy in OR  LINES/TUBES: ETT 9/28 >> 9/29 Rt PICC 10/02 >> ETT 10/10 >>   DISCUSSION: 75 yo female former smoker with rheumatic heart disease, mitral stenosis, chronic a fib.  She had MV replacement with bioprosthetic valve, TV annuloplasty, MAZE on 9/28.  Developed mucus plugging, requiring ETT for repeat bronchoscopies.  Also developed b/l pleural effusions requiring b/l pleurx catheter placement.  She also has PMHx of CAD, diastolic CHF, HTN, HLD, Stage III CKD, Low back pain, Hypothyroidism,   ASSESSMENT / PLAN:  PULMONARY A: Acute respiratory failure 2nd to mucus plugging and b/l pleural effusions. P:   Proceed with extubation attempt 10/16 F/u CXR Scheduled BDs for now Bronchial hygiene Bipap qhs and prn for now Pleurx catheters per TCTS  CARDIOVASCULAR A:  Rheumatic heart disease s/p MVR and TV repair. A fib s/p MAZE. Hx of CAD, HTN, chronic diastolic dysfx. P:  Pressors, diuresis, anticoagulation per TCTS  RENAL A:   CKD 3. P:   Monitor renal fx, urine outpt  GASTROINTESTINAL A:   Nutrition. Morbid obesity. P:   Will likely need speech to assess swallowing after extubation  HEMATOLOGIC A:   Anemia of critical illness. P:  F/u CBC  INFECTIOUS A:   HCAP. P:   Day 11 of ABx >> might be able to d/c ABx soon  ENDOCRINE A:   Hx of hypothyroidism. P:   Continue synthroid  NEUROLOGIC A:   Deconditioning. P:   PT/OT assessment after extubation  D/w Dr. Barry Dienes.  CC time 34 minutes.  [name], MD [facility] Pulmonary/Critical Care 07/13/2016, 9:16 AM Pager:   [phone] After 3pm call: [phone]

## 2016-07-13 NOTE — Progress Notes (Addendum)
Extubated earlier today.  Has progressively increasing O2 needs.  Placed on Bipap >> appears comfortable with FiO2 50%.  Will ask pharmacy to dose lovenox for A fib since she can't take pills.    Will f/u BMET and determine if she needs IV potassium replacement.  Updated pt's son at bedside.  Will try to bridge with Bipap >> if fails, then reintubate and consider early tracheostomy.  CC time 21 minutes.  Coralyn HellingVineet Ritesh Opara, MD [facility] Pulmonary/Critical Care 07/13/2016, 12:50 PM Pager:  [phone] After 3pm call: [phone]

## 2016-07-13 NOTE — Progress Notes (Signed)
      [address]       [name] [id number]             [phone]    Extubated earlier today. Was on BIPAP earlier now on NRB  BP (!) 87/55   Pulse 67   Temp 98.5 F (36.9 C) (Axillary)   Resp (!) 21   Ht 5\' 1"  (1.549 m)   Wt 248 lb 10.9 oz (112.8 kg)   SpO2 99%   BMI 46.99 kg/m    Intake/Output Summary (Last 24 hours) at 07/13/16 1832 Last data filed at 07/13/16 1800  Gross per 24 hour  Intake           2720.8 ml  Output             2105 ml  Net            615.8 ml   Still on neo and lasix drips  Continue current care, high risk to need reintubation  Viviann SpareSteven C. Dorris FetchHendrickson, MD [facility] [phone]

## 2016-07-13 NOTE — Plan of Care (Signed)
Dr. Craige CottaSood aware of increasing oxygen requirements (100%) NRB) to place pt on bipap

## 2016-07-14 ENCOUNTER — Inpatient Hospital Stay (HOSPITAL_COMMUNITY): Payer: Medicare Other

## 2016-07-14 DIAGNOSIS — J9622 Acute and chronic respiratory failure with hypercapnia: Secondary | ICD-10-CM

## 2016-07-14 LAB — PROTIME-INR
INR: 1.76
INR: 1.7
PROTHROMBIN TIME: 20.7 s — AB (ref 11.4–15.2)
Prothrombin Time: 20.2 seconds — ABNORMAL HIGH (ref 11.4–15.2)

## 2016-07-14 LAB — GLUCOSE, CAPILLARY
GLUCOSE-CAPILLARY: 105 mg/dL — AB (ref 65–99)
GLUCOSE-CAPILLARY: 107 mg/dL — AB (ref 65–99)
GLUCOSE-CAPILLARY: 109 mg/dL — AB (ref 65–99)
GLUCOSE-CAPILLARY: 201 mg/dL — AB (ref 65–99)
GLUCOSE-CAPILLARY: 96 mg/dL (ref 65–99)
Glucose-Capillary: 103 mg/dL — ABNORMAL HIGH (ref 65–99)
Glucose-Capillary: 107 mg/dL — ABNORMAL HIGH (ref 65–99)
Glucose-Capillary: 108 mg/dL — ABNORMAL HIGH (ref 65–99)
Glucose-Capillary: 47 mg/dL — ABNORMAL LOW (ref 65–99)
Glucose-Capillary: 48 mg/dL — ABNORMAL LOW (ref 65–99)

## 2016-07-14 LAB — CBC
HCT: 20.8 % — ABNORMAL LOW (ref 36.0–46.0)
HEMATOCRIT: 32.6 % — AB (ref 36.0–46.0)
HEMOGLOBIN: 10.2 g/dL — AB (ref 12.0–15.0)
Hemoglobin: 6.6 g/dL — CL (ref 12.0–15.0)
MCH: 30.3 pg (ref 26.0–34.0)
MCH: 30.2 pg (ref 26.0–34.0)
MCHC: 31.7 g/dL (ref 30.0–36.0)
MCHC: 31.3 g/dL (ref 30.0–36.0)
MCV: 95.4 fL (ref 78.0–100.0)
MCV: 96.4 fL (ref 78.0–100.0)
PLATELETS: 182 10*3/uL (ref 150–400)
Platelets: 153 10*3/uL (ref 150–400)
RBC: 2.18 MIL/uL — AB (ref 3.87–5.11)
RBC: 3.38 MIL/uL — AB (ref 3.87–5.11)
RDW: 16.7 % — AB (ref 11.5–15.5)
RDW: 16.9 % — ABNORMAL HIGH (ref 11.5–15.5)
WBC: 10.1 10*3/uL (ref 4.0–10.5)
WBC: 8.6 10*3/uL (ref 4.0–10.5)

## 2016-07-14 LAB — BASIC METABOLIC PANEL
ANION GAP: 8 (ref 5–15)
ANION GAP: 9 (ref 5–15)
BUN: 36 mg/dL — ABNORMAL HIGH (ref 6–20)
BUN: 40 mg/dL — ABNORMAL HIGH (ref 6–20)
CHLORIDE: 95 mmol/L — AB (ref 101–111)
CO2: 31 mmol/L (ref 22–32)
CO2: 34 mmol/L — AB (ref 22–32)
CREATININE: 1.17 mg/dL — AB (ref 0.44–1.00)
Calcium: 7 mg/dL — ABNORMAL LOW (ref 8.9–10.3)
Calcium: 7.9 mg/dL — ABNORMAL LOW (ref 8.9–10.3)
Chloride: 96 mmol/L — ABNORMAL LOW (ref 101–111)
Creatinine, Ser: 1.12 mg/dL — ABNORMAL HIGH (ref 0.44–1.00)
GFR calc non Af Amer: 44 mL/min — ABNORMAL LOW (ref >60)
GFR, EST AFRICAN AMERICAN: 54 mL/min — AB (ref >60)
GFR, EST AFRICAN AMERICAN: 51 mL/min — AB (ref >60)
GFR, EST NON AFRICAN AMERICAN: 46 mL/min — AB (ref >60)
GLUCOSE: 254 mg/dL — AB (ref 65–99)
Glucose, Bld: 108 mg/dL — ABNORMAL HIGH (ref 65–99)
POTASSIUM: 4 mmol/L (ref 3.5–5.1)
POTASSIUM: 4.6 mmol/L (ref 3.5–5.1)
SODIUM: 138 mmol/L (ref 135–145)
Sodium: 135 mmol/L (ref 135–145)

## 2016-07-14 LAB — COOXEMETRY PANEL
CARBOXYHEMOGLOBIN: 2 % — AB (ref 0.5–1.5)
METHEMOGLOBIN: 0.6 % (ref 0.0–1.5)
O2 SAT: 76.8 %
TOTAL HEMOGLOBIN: 14.1 g/dL (ref 12.0–16.0)

## 2016-07-14 MED ORDER — PRO-STAT SUGAR FREE PO LIQD
30.0000 mL | Freq: Two times a day (BID) | ORAL | Status: DC
  Administered 2016-07-15 – 2016-07-22 (×12): 30 mL via ORAL
  Filled 2016-07-14 (×12): qty 30

## 2016-07-14 MED ORDER — ACETAMINOPHEN 325 MG PO TABS
650.0000 mg | ORAL_TABLET | Freq: Four times a day (QID) | ORAL | Status: DC | PRN
  Administered 2016-07-17 – 2016-07-18 (×3): 650 mg via ORAL
  Filled 2016-07-14 (×3): qty 2

## 2016-07-14 MED ORDER — BOOST / RESOURCE BREEZE PO LIQD
1.0000 | Freq: Three times a day (TID) | ORAL | Status: DC
  Administered 2016-07-16 – 2016-07-20 (×8): 1 via ORAL
  Administered 2016-07-20: 17:00:00 via ORAL
  Administered 2016-07-20: 1 via ORAL
  Administered 2016-07-23: 237 mL via ORAL
  Administered 2016-07-23: 1 via ORAL

## 2016-07-14 MED ORDER — DEXTROSE 50 % IV SOLN
INTRAVENOUS | Status: AC
  Administered 2016-07-14: 25 mL
  Filled 2016-07-14: qty 50

## 2016-07-14 MED ORDER — LEVOTHYROXINE SODIUM 50 MCG PO TABS
50.0000 ug | ORAL_TABLET | Freq: Every day | ORAL | Status: DC
  Administered 2016-07-14 – 2016-07-22 (×8): 50 ug via ORAL
  Filled 2016-07-14 (×8): qty 1

## 2016-07-14 MED ORDER — WARFARIN SODIUM 5 MG PO TABS
5.0000 mg | ORAL_TABLET | Freq: Every day | ORAL | Status: DC
  Administered 2016-07-14 – 2016-07-16 (×3): 5 mg via ORAL
  Filled 2016-07-14 (×3): qty 1

## 2016-07-14 NOTE — Procedures (Signed)
Pt is resting well on 3L [place], BiPAP is not needed at this time.  RT will continue to monitor pt and will place if needed.

## 2016-07-14 NOTE — Progress Notes (Signed)
Hypoglycemic Event  CBG: 47  Treatment: D50 1/2 amp  Symptoms: low blood sugar, shaking of hands, dizziness  Follow-up CBG: Time:1205 CBG Result:96   Possible Reasons for Event: Unknown  Comments/MD notified:    Darrel HooverWilson,Sharrell Krawiec S

## 2016-07-14 NOTE — Progress Notes (Addendum)
      [address]       [name] [id number]             [phone]        CARDIOTHORACIC SURGERY PROGRESS NOTE  R19Days Post-Op Procedure(s) (LRB): MITRAL VALVE (MV) REPLACEMENT (N/A) TRICUSPID VALVE REPAIR (N/A) MAZE (N/A) TRANSESOPHAGEAL ECHOCARDIOGRAM (TEE) (N/A)   R5 Days Post-Op Procedure(s) (LRB): INSERTION PLEURAL DRAINAGE CATHETER (Bilateral) VIDEO BRONCHOSCOPY (N/A)  Subjective: Awake and alert.  Denies SOB.  Slept well.  Wants breakfast  Objective: Vital signs: BP Readings from Last 1 Encounters:  07/14/16 105/72   Pulse Readings from Last 1 Encounters:  07/14/16 60   Resp Readings from Last 1 Encounters:  07/14/16 16   Temp Readings from Last 1 Encounters:  07/14/16 97.6 F (36.4 C)    Hemodynamics: Mixed venous co-ox 76.8%  Physical Exam:  Rhythm:   junctional  Breath sounds: clear  Heart sounds:  RRR  Incisions:  Clean and dry  Abdomen:  Soft, non-distended, non-tender  Extremities:  Warm, well-perfused   Intake/Output from previous day: 10/16 0701 - 10/17 0700 In: 1478.9 [P.O.:60; I.V.:1238.9; NG/GT:130; IV Piggyback:50] Out: 1750 [Urine:1645; Chest Tube:105] Intake/Output this shift: No intake/output data recorded.  Lab Results:  CBC: Recent Labs  07/14/16 0410 07/14/16 0456  WBC 10.1 8.6  HGB 6.6* 10.2*  HCT 20.8* 32.6*  PLT 182 153    BMET:  Recent Labs  07/14/16 0410 07/14/16 0456  NA 135 138  K 4.0 4.6  CL 96* 95*  CO2 31 34*  GLUCOSE 254* 108*  BUN 36* 40*  CREATININE 1.12* 1.17*  CALCIUM 7.0* 7.9*     PT/INR:   Recent Labs  07/14/16 0456  LABPROT 20.2*  INR 1.70    CBG (last 3)   Recent Labs  07/14/16 0035 07/14/16 0341 07/14/16 0743  GLUCAP 109* 107* 201*    ABG    Component Value Date/Time   PHART 7.451 (H) 07/11/2016 1205   PCO2ART 49.1 (H) 07/11/2016 1205   PO2ART 78.2 (L) 07/11/2016 1205   HCO3 33.9 (H) 07/11/2016 1205   TCO2 34 07/02/2016 1749   ACIDBASEDEF 3.0  (H) 06/26/2016 0859   O2SAT 95.7 07/11/2016 1205    CXR: Mild bibasilar atelectasis +/- effusions L>R - overall looks stable  Assessment/Plan:  [id number] original surgery Acute on chronic respiratory failure, stable since extubation yesterday Breathing comfortably w/ O2 sats 96% on 6 L/min via [place] and [place] Maintaining stablerhythm andHR and BP although still on low dose dopamine for BP support, Neo weaned off overnight Permanent atrial fibrillation now s/p maze procedure - HR stable and well controlled Acute on chronic diastolic CHF with expected post-op volume excess,I/O's negative yesterday, weight still reportedly 13 lbs above preop baseline and up 4 lbs yesterday ? accurate Expected post op acute blood loss anemia, Hgb stable10.2this morning Leukocytosis - resolved - now has completed > 7 day course empiric ABx  Longstanding rheumatic heart disease  Pulmonary hypertension Chronic kidney disease stage III, creatinine stable Morbid obesity Physical deconditioning - severe Protein-depleted malnutrition  Chronic venous insufficiency   Mobilize out of bed  Pulmonary toilet  Stop antibiotics  Resume oral diet if swallowing okay  Consult dietician - will need protein supplementation  Wean dopamine as tolerated  Hold lasix drip for now   Continue coumadin - stop lovenox now that INR 1.7  Purcell Nailslarence H Owen, MD 07/14/2016 8:12 AM

## 2016-07-14 NOTE — Progress Notes (Signed)
Physical Therapy Treatment/ Re-evaluation Patient Details Name: Ann Potter MRN: [id number] DOB: Oct 24, 1940 Today's Date: 07/14/2016    History of Present Illness Patient is a 75 yo female admitted 06/03/2016 due to severe mitral valve stenosis and tricuspid valve regurgitation, now s/p MVR, TVR, and Maze procedure same date, bronchoscopy 10/7 and 10/9, intubation 10/10-10/16 with pleurex placed.   PMH:  morbid obesity, HOH, poor eyesight, mitral stenosis, tricuspid regurg, rheumatic heart disease, anxiety, Afib, CAD, MI, CHF, venous insufficiency, chronic LE edema, HTN, CKD, asthma, chronic back pain    PT Comments    Pt up in chair on arrival after reintubation for 6 days. Pt significantly weaker than prior to re-intubation with increased difficulty moving legs, unable to achieve standing with 2 person assist and sats fluctuating on 6L from 85-97% with limited mobility attempts. Pt with generalized weakness, impaired transfers and gait who will continue to benefit from acute therapy to maximize function and strength. Currently recommend continued use of lift equipment for OOB transfers as well as pt encouraged to perform and continue HEP throughout the day. SNF remains appropriate. Pt educated for all sternal precautions.   HR 79-87  Follow Up Recommendations  SNF;Supervision/Assistance - 24 hour     Equipment Recommendations       Recommendations for Other Services OT consult     Precautions / Restrictions Precautions Precautions: Sternal;Fall Precaution Comments:  Reviewed sternal precautions pt able to state 3/5    Mobility  Bed Mobility               General bed mobility comments: pt lifted to chair by nursing prior to session  Transfers Overall transfer level: Needs assistance   Transfers: Sit to/from Stand Sit to Stand: Max assist;+2 physical assistance         General transfer comment: attempted to stand x 3 from chair with 2 person max assist, pt able to  scoot to edge of chair with assist and initiate anterior translation and rise but could not fully clear sacrum on any trial. Total assist +2 to scoot back in chair (attempted with reciprocal scooting in sittin with lifting leg but limited success, fully reclined chair and use of pad to scoot back)  Ambulation/Gait             General Gait Details: unable at this time   Stairs            Wheelchair Mobility    Modified Rankin (Stroke Patients Only)       Balance Overall balance assessment: Needs assistance   Sitting balance-Leahy Scale: Fair                              Cognition Arousal/Alertness: Awake/alert Behavior During Therapy: WFL for tasks assessed/performed Overall Cognitive Status: Within Functional Limits for tasks assessed       Memory: Decreased recall of precautions              Exercises General Exercises - Lower Extremity Ankle Circles/Pumps: AROM;Both;10 reps;Seated Short Arc Quad: AROM;Both;10 reps;Seated Heel Slides: AAROM;Both;10 reps;Seated Hip ABduction/ADduction: AAROM;Both;10 reps;Seated    General Comments        Pertinent Vitals/Pain Pain Score: 3  Pain Location: left leg Pain Descriptors / Indicators: Sore Pain Intervention(s): Limited activity within patient's tolerance;Repositioned    Home Living                      Prior Function  PT Goals (current goals can now be found in the care plan section) Acute Rehab PT Goals Patient Stated Goal: To get stronger PT Goal Formulation: With patient Time For Goal Achievement: 07/28/16 Potential to Achieve Goals: Fair Progress towards PT goals: Goals downgraded-see care plan    Frequency    Min 3X/week      PT Plan Current plan remains appropriate    Co-evaluation             End of Session Equipment Utilized During Treatment: Gait belt;Oxygen Activity Tolerance: Patient tolerated treatment well Patient left: in chair;with  call bell/phone within reach     Time: 1100-1124 PT Time Calculation (min) (ACUTE ONLY): 24 min  Charges:  $Therapeutic Exercise: 8-22 mins                    G Codes:      Delorse Lekabor, Shalom Mcguiness Beth 07/14/2016, 12:36 PM Delaney MeigsMaija Tabor Synda Bagent, PT [phone]

## 2016-07-14 NOTE — Progress Notes (Signed)
PULMONARY / CRITICAL CARE MEDICINE   Name: Ann Potter MRN: [id number] DOB: 28-Jul-1941    ADMISSION DATE:  06/10/2016 CONSULTATION DATE:  07/05/2016  REFERRING MD:  Dr. Barry Dienes, TCTS  CHIEF COMPLAINT:  Short of breath  SUBJECTIVE:  Slept well.  Feels that Bipap helps.  VITAL SIGNS: BP 105/72   Pulse 60   Temp 97.6 F (36.4 C)   Resp 16   Ht 5\' 1"  (1.549 m)   Wt 253 lb 8.5 oz (115 kg)   SpO2 100%   BMI 47.90 kg/m   VENTILATOR SETTINGS: Vent Mode: BIPAP FiO2 (%):  [50 %-100 %] 50 % Set Rate:  [10 bmp] 10 bmp PEEP:  [5 cmH20] 5 cmH20 Pressure Support:  [5 cmH20] 5 cmH20  INTAKE / OUTPUT: I/O last 3 completed shifts: In: 3239.3 [P.O.:60; I.V.:1879.3; NG/GT:1150; IV Piggyback:150] Out: 2715 [Urine:2410; Emesis/NG output:200; Chest Tube:105]  PHYSICAL EXAMINATION: General:  alert Neuro:  Normal strength HEENT:  No stridor Cardiovascular:  Regular, 2/6 SM, wound site clean Lungs:  Diminished breath sounds, no wheeze Abdomen:  Soft, non tender Musculoskeletal:  1+ edema Skin:  No rashes  LABS:  BMET  Recent Labs Lab 07/13/16 1535 07/14/16 0410 07/14/16 0456  NA 138 135 138  K 4.1 4.0 4.6  CL 94* 96* 95*  CO2 34* 31 34*  BUN 38* 36* 40*  CREATININE 1.16* 1.12* 1.17*  GLUCOSE 95 254* 108*    Electrolytes  Recent Labs Lab 07/12/16 0400 07/13/16 0409 07/13/16 1535 07/14/16 0410 07/14/16 0456  CALCIUM 8.0* 7.9* 7.8* 7.0* 7.9*  MG 1.1* 2.2  --   --   --   PHOS 2.4*  --   --   --   --     CBC  Recent Labs Lab 07/13/16 0409 07/14/16 0410 07/14/16 0456  WBC 11.9* 10.1 8.6  HGB 10.7* 6.6* 10.2*  HCT 34.1* 20.8* 32.6*  PLT 197 182 153    Coag's  Recent Labs Lab 07/13/16 0409 07/14/16 0410 07/14/16 0456  INR 1.59 1.76 1.70    Sepsis Markers  Recent Labs Lab 07/10/16 1200 07/11/16 0430 07/12/16 0400  PROCALCITON 0.68 0.68 0.78    ABG  Recent Labs Lab 07/21/2016 1430 07/08/16 1012 07/11/16 1205  PHART 7.384 7.447  7.451*  PCO2ART 62.7* 51.6* 49.1*  PO2ART 82.3* 70.8* 78.2*    Liver Enzymes  Recent Labs Lab 07/10/16 0325  AST 28  ALT 17  ALKPHOS 108  BILITOT 1.7*  ALBUMIN 2.3*    Cardiac Enzymes No results for input(s): TROPONINI, PROBNP in the last 168 hours.  Glucose  Recent Labs Lab 07/13/16 1122 07/13/16 1527 07/13/16 2005 07/14/16 0015 07/14/16 0035 07/14/16 0341  GLUCAP 121* 101* 106* 103* 109* 107*    Imaging No results found.   STUDIES:  CT chest 10/06 >> Lt sided ATX/collapse with mucus plug CT angio chest 10/11 >> b/l pleural effusions  CULTURES: Sputum 10/06 >> oral flora BAL 10/09 >> oral flora BAL 10/10 >> oral flora Sputum 10/12 >> few Candida albicans  ANTIBIOTICS: Elita Quick 10/06 >> Vancomycin 10/09 >> 10/12 Vancomycin 10/15 >>   SIGNIFICANT EVENTS: 09/28 Admit; MV replacement, TV repair, MAZE 10/06 Atelectasis  10/07 Bronchoscopy at bedside 10/09 Bronchoscopy at bedside 10/10 To OR for ETT and bronchoscopy 10/12 b/l pleurx catheter placement, bronchoscopy in OR 10/16 Extubated >> intermittently needing Bipap  LINES/TUBES: ETT 9/28 >> 9/29 Rt PICC 10/02 >> ETT 10/10 >> 10/16  DISCUSSION: 75 yo female former smoker with rheumatic heart disease, mitral  stenosis, chronic a fib.  She had MV replacement with bioprosthetic valve, TV annuloplasty, MAZE on 9/28.  Developed mucus plugging, requiring ETT for repeat bronchoscopies.  Also developed b/l pleural effusions requiring b/l pleurx catheter placement.  She also has PMHx of CAD, diastolic CHF, HTN, HLD, Stage III CKD, Low back pain, Hypothyroidism,   ASSESSMENT / PLAN:  PULMONARY A: Acute respiratory failure 2nd to mucus plugging and b/l pleural effusions. P:   Bipap qhs and prn during day Oxygen to keep SpO2 90 to 95% F/u CXR intermittently Scheduled BDs for now Bronchial hygiene Pleurx catheters per TCTS  CARDIOVASCULAR A:  Rheumatic heart disease s/p MVR and TV repair. A fib s/p  MAZE. Hx of CAD, HTN, chronic diastolic dysfx. P:  Pressors, diuresis, anticoagulation per TCTS  RENAL A:   CKD 3. P:   Monitor renal fx, urine outpt  GASTROINTESTINAL A:   Nutrition. Morbid obesity. P:   Will get speech to assess swallowing  HEMATOLOGIC A:   Anemia of critical illness. P:  F/u CBC  INFECTIOUS A:   HCAP. P:   Day 12 of ABx >> cultures negative >> will d/c Abx 10/17 and monitor  ENDOCRINE A:   Hx of hypothyroidism. P:   Continue synthroid  NEUROLOGIC A:   Deconditioning. P:   PT/OT assessment  Coralyn HellingVineet Kadasia Kassing, MD [facility] Pulmonary/Critical Care 07/14/2016, 7:53 AM Pager:  [phone] After 3pm call: [phone]

## 2016-07-14 NOTE — Progress Notes (Addendum)
Nutrition Consult/Follow Up  DOCUMENTATION CODES:   Morbid obesity  INTERVENTION:    Boost Breeze po TID, each supplement provides 250 kcal and 9 grams of protein   Prostat liquid protein po 30 ml BID with meals, each supplement provides 100 kcal, 15 grams protein  NUTRITION DIAGNOSIS:   Increased nutrient needs related to acute illness (post-op healing) as evidenced by estimated needs, ongoing  GOAL:   Patient will meet greater than or equal to 90% of their needs, currently unmet  MONITOR:   PO intake, Supplement acceptance, Labs, Weight trends, Skin, I & O's,  ASSESSMENT:   75 yo Female admitted 06/17/2016 due to 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  Patient s/p procedures 10/10: VIDEO BRONCHOSCOPY INTUBATION REMOVAL OF SECRETIONS   Extubated 10/16. Advanced to Clear Liquid diet this AM. Speech Path consulted for swallow evaluation. Will add oral nutrition supplements and liquid protein. Labs and medications reviewed.   Diet Order:  Diet clear liquid Room service appropriate? Yes; Fluid consistency: Thin  Skin:  Reviewed, no issues   CBG (last 3)   Recent Labs  07/14/16 0035 07/14/16 0341 07/14/16 0743  GLUCAP 109* 107* 201*   Last BM:  10/15  Height:   Ht Readings from Last 1 Encounters:  07/03/16 5\' 1"  (1.549 m)    Weight:   Wt Readings from Last 1 Encounters:  07/14/16 253 lb 8.5 oz (115 kg)    Ideal Body Weight:  45.4 kg  BMI:  Body mass index is 47.9 kg/m.  Re-estimated Nutritional Needs:   Kcal:  1600-1800  Protein:  80-90 gm  Fluid:  1.6-1.8 L  EDUCATION NEEDS:   No education needs identified at this time  Maureen ChattersKatie Rontae Inglett, RD, LDN Pager #: [phone] After-Hours Pager #: [phone]

## 2016-07-15 ENCOUNTER — Inpatient Hospital Stay (HOSPITAL_COMMUNITY): Payer: Medicare Other

## 2016-07-15 LAB — GLUCOSE, CAPILLARY
GLUCOSE-CAPILLARY: 103 mg/dL — AB (ref 65–99)
GLUCOSE-CAPILLARY: 110 mg/dL — AB (ref 65–99)
GLUCOSE-CAPILLARY: 121 mg/dL — AB (ref 65–99)
GLUCOSE-CAPILLARY: 145 mg/dL — AB (ref 65–99)
GLUCOSE-CAPILLARY: 49 mg/dL — AB (ref 65–99)
Glucose-Capillary: 158 mg/dL — ABNORMAL HIGH (ref 65–99)
Glucose-Capillary: 46 mg/dL — ABNORMAL LOW (ref 65–99)

## 2016-07-15 LAB — BASIC METABOLIC PANEL
ANION GAP: 6 (ref 5–15)
Anion gap: 11 (ref 5–15)
BUN: 48 mg/dL — AB (ref 6–20)
BUN: 47 mg/dL — ABNORMAL HIGH (ref 6–20)
CALCIUM: 8.2 mg/dL — AB (ref 8.9–10.3)
CALCIUM: 7.4 mg/dL — AB (ref 8.9–10.3)
CHLORIDE: 95 mmol/L — AB (ref 101–111)
CO2: 29 mmol/L (ref 22–32)
CO2: 28 mmol/L (ref 22–32)
CREATININE: 1.58 mg/dL — AB (ref 0.44–1.00)
CREATININE: 1.53 mg/dL — AB (ref 0.44–1.00)
Chloride: 103 mmol/L (ref 101–111)
GFR calc non Af Amer: 32 mL/min — ABNORMAL LOW (ref >60)
GFR, EST AFRICAN AMERICAN: 36 mL/min — AB (ref >60)
GFR, EST AFRICAN AMERICAN: 37 mL/min — AB (ref >60)
GFR, EST NON AFRICAN AMERICAN: 31 mL/min — AB (ref >60)
Glucose, Bld: 101 mg/dL — ABNORMAL HIGH (ref 65–99)
Glucose, Bld: 165 mg/dL — ABNORMAL HIGH (ref 65–99)
Potassium: 6.2 mmol/L — ABNORMAL HIGH (ref 3.5–5.1)
Potassium: 5.4 mmol/L — ABNORMAL HIGH (ref 3.5–5.1)
SODIUM: 135 mmol/L (ref 135–145)
SODIUM: 137 mmol/L (ref 135–145)

## 2016-07-15 LAB — CBC
HCT: 33.1 % — ABNORMAL LOW (ref 36.0–46.0)
Hemoglobin: 10.4 g/dL — ABNORMAL LOW (ref 12.0–15.0)
MCH: 30.2 pg (ref 26.0–34.0)
MCHC: 31.4 g/dL (ref 30.0–36.0)
MCV: 96.2 fL (ref 78.0–100.0)
PLATELETS: 131 10*3/uL — AB (ref 150–400)
RBC: 3.44 MIL/uL — AB (ref 3.87–5.11)
RDW: 16.6 % — AB (ref 11.5–15.5)
WBC: 9.5 10*3/uL (ref 4.0–10.5)

## 2016-07-15 LAB — PROTIME-INR
INR: 1.73
PROTHROMBIN TIME: 20.5 s — AB (ref 11.4–15.2)

## 2016-07-15 MED ORDER — RESOURCE THICKENUP CLEAR PO POWD
ORAL | Status: DC | PRN
  Filled 2016-07-15: qty 125

## 2016-07-15 MED ORDER — ALBUMIN HUMAN 25 % IV SOLN
12.5000 g | Freq: Four times a day (QID) | INTRAVENOUS | Status: AC
  Administered 2016-07-15 – 2016-07-17 (×8): 12.5 g via INTRAVENOUS
  Filled 2016-07-15: qty 50
  Filled 2016-07-15: qty 100
  Filled 2016-07-15 (×3): qty 50
  Filled 2016-07-15: qty 100

## 2016-07-15 NOTE — Progress Notes (Signed)
Patient ID: Sherle PoeGloria Magley, female   DOB: [birth year], 75 y.o.   MRN: [id number] EVENING ROUNDS NOTE :     [address]       [place] [id number]             [phone]                 6 Days Post-Op Procedure(s) (LRB): INSERTION PLEURAL DRAINAGE CATHETER (Bilateral) VIDEO BRONCHOSCOPY (N/A)  Total Length of Stay:  LOS: 20 days  BP (!) 82/42   Pulse 66   Temp 97.7 F (36.5 C) (Oral)   Resp 20   Ht 5\' 1"  (1.549 m)   Wt 249 lb 9 oz (113.2 kg)   SpO2 92%   BMI 47.15 kg/m   .Intake/Output      10/18 0701 - 10/19 0700   P.O. 240   I.V. (mL/kg) 233.8 (2.1)   IV Piggyback 50   Total Intake(mL/kg) 523.8 (4.6)   Urine (mL/kg/hr)    Chest Tube    Total Output     Net +523.8       Urine Occurrence 1 x     . sodium chloride 30 mL/hr at 07/15/16 1300  . DOPamine 4 mcg/kg/min (07/15/16 1813)     Lab Results  Component Value Date   WBC 9.5 07/15/2016   HGB 10.4 (L) 07/15/2016   HCT 33.1 (L) 07/15/2016   PLT 131 (L) 07/15/2016   GLUCOSE 165 (H) 07/15/2016   TRIG 89 07/10/2016   ALT 17 07/10/2016   AST 28 07/10/2016   NA 137 07/15/2016   K 5.4 (H) 07/15/2016   CL 103 07/15/2016   CREATININE 1.53 (H) 07/15/2016   BUN 47 (H) 07/15/2016   CO2 28 07/15/2016   INR 1.73 07/15/2016   HGBA1C 5.3 06/22/2016   Stable day, remains off vent   Delight OvensEdward B Opie Fanton MD  Beeper [phone] Office [phone] 07/15/2016 7:17 PM

## 2016-07-15 NOTE — Evaluation (Signed)
Clinical/Bedside Swallow Evaluation Patient Details  Name: Ann Potter MRN: [id number] Date of Birth: 1940/12/27  Today's Date: 07/15/2016 Time: SLP Start Time (ACUTE ONLY): 0913 SLP Stop Time (ACUTE ONLY): 0932 SLP Time Calculation (min) (ACUTE ONLY): 19 min  Past Medical History:  Past Medical History:  Diagnosis Date  . Anxiety   . Arthritis    "feet & legs" (04/16/2016)  . Asthma   . Atrial fibrillation (HCC)   . Atrial fibrillation, chronic (HCC) 01/25/2012   Status post DCCV, June 2013  . CAD (coronary artery disease)    Nonobstructive, by catheterization 4/13  . CHF (congestive heart failure) (HCC)   . Chronic diastolic congestive heart failure (HCC) 01/25/2012  . Chronic lower back pain   . Chronic renal insufficiency, stage III (moderate) 01/25/2012  . Chronic venous insufficiency   . COPD (chronic obstructive pulmonary disease) (HCC)   . Heart murmur   . HLD (hyperlipidemia)   . HTN (hypertension)   . Hypothyroidism   . Lower extremity edema   . Macular degeneration   . Mitral stenosis    Mild, by catheterization 4/13  . Morbid obesity (HCC)   . Myocardial infarction 2007  . Pulmonary hypertension    Moderate, by catheterization 4/13  . Rheumatic fever during childhood    Age [age]  . Rheumatic heart disease   . S/P Maze operation for atrial fibrillation 06/17/2016   Complete bilateral atrial lesion set using cryothermy and bipolar radiofrequency ablation with clipping of LA appendage  . S/P mitral valve replacement with bioprosthetic valve 06/22/2016   29 mm [facility] Mitral bovine pericardial tissue valve  . S/P tricuspid valve repair 06/11/2016   26 mm Edwards mc3 ring annuloplasty  . Tricuspid regurgitation   . Worries    Past Surgical History:  Past Surgical History:  Procedure Laterality Date  . CARDIAC CATHETERIZATION  12/2011; 04/16/2016  . CARDIAC CATHETERIZATION N/A 04/16/2016   Procedure: Right/Left Heart Cath and Coronary Angiography;   Surgeon: Tonny Bollman, MD;  Location: [facility] INVASIVE CV LAB;  Service: Cardiovascular;  Laterality: N/A;  . CARDIOVERSION    . CATARACT EXTRACTION W/ INTRAOCULAR LENS  IMPLANT, BILATERAL Bilateral   . CHEST TUBE INSERTION Bilateral 07/06/2016   Procedure: INSERTION PLEURAL DRAINAGE CATHETER;  Surgeon: Purcell Nails, MD;  Location: [facility] OR;  Service: Thoracic;  Laterality: Bilateral;  . MAZE N/A 05/29/2016   Procedure: MAZE;  Surgeon: Purcell Nails, MD;  Location: [place] OR;  Service: Open Heart Surgery;  Laterality: N/A;  . MITRAL VALVE REPLACEMENT N/A 06/06/2016   Procedure: MITRAL VALVE (MV) REPLACEMENT;  Surgeon: Purcell Nails, MD;  Location: [facility] OR;  Service: Open Heart Surgery;  Laterality: N/A;  . MULTIPLE TOOTH EXTRACTIONS  1965  . TEE WITH CARDIOVERSION     3 year ago  . TEE WITHOUT CARDIOVERSION N/A 06/25/2015   Procedure: TRANSESOPHAGEAL ECHOCARDIOGRAM (TEE);  Surgeon: Antoine Poche, MD;  Location: [facility] ENDO SUITE;  Service: Cardiology;  Laterality: N/A;  moved to 8:30 - Terri calling pt & Bernie  . TEE WITHOUT CARDIOVERSION N/A 06/20/2016   Procedure: TRANSESOPHAGEAL ECHOCARDIOGRAM (TEE);  Surgeon: Purcell Nails, MD;  Location: [facility] OR;  Service: Open Heart Surgery;  Laterality: N/A;  . TRICUSPID VALVE REPLACEMENT N/A 06/14/2016   Procedure: TRICUSPID VALVE REPAIR;  Surgeon: Purcell Nails, MD;  Location: [facility] OR;  Service: Open Heart Surgery;  Laterality: N/A;  . TUBAL LIGATION  1984  . VIDEO BRONCHOSCOPY N/A 08/05/16   Procedure: VIDEO BRONCHOSCOPY/ INTUBATION/  REMOVAL OF SECRETIONS;  Surgeon: Delight OvensEdward B Gerhardt, MD;  [facility];  Service: Thoracic;  Laterality: N/A;  . VIDEO BRONCHOSCOPY N/A 07/08/2016   Procedure: VIDEO BRONCHOSCOPY;  Surgeon: Purcell Nailslarence H Owen, MD;  Location: [facility] OR;  Service: Thoracic;  Laterality: N/A;   HPI:  Patient is a 75 yo female admitted 06/18/2016 due to severe mitral valve stenosis and tricuspid valve regurgitation, now s/p MVR, TVR, and Maze procedure  same date, bronchoscopy 10/7 and 10/9, intubation 10/10-10/16 with pleurex placed.   PMH:  morbid obesity, HOH, poor eyesight, mitral stenosis, tricuspid regurg, rheumatic heart disease, anxiety, Afib, CAD, MI, CHF, venous insufficiency, chronic LE edema, HTN, CKD, asthma, chronic back pain   Assessment / Plan / Recommendation Clinical Impression  Pt was upright in chair presenting with mildly hoarse vocal quality upon arrival. She had a strong cough. During initial trials of ice chips and thin liquids via tsp pt showed no s/s of aspiration. Given thin liquids via cup pt had immediate coughing. No difficulty was observed with pureed solids. Of note, dentures were not available in room, but pt remarked having them. Nectar thick liquids were administered without immediate complications; however delayed coughing after SLP exited room was observed. Recommend conservative Dys 1 and nectar thick liquids. Please provide full supervision during PO intake and if coughing occurs hold PO's. Will f/u closely for diet tolerance.    Aspiration Risk  Moderate aspiration risk    Diet Recommendation Dysphagia 1 (Puree);Nectar-thick liquid   Liquid Administration via: Cup;No straw Medication Administration: Crushed with puree Supervision: Staff to assist with self feeding;Full supervision/cueing for compensatory strategies Compensations: Minimize environmental distractions;Slow rate;Small sips/bites Postural Changes: Seated upright at 90 degrees;Remain upright for at least 30 minutes after po intake    Other  Recommendations Oral Care Recommendations: Oral care BID Other Recommendations: Order thickener from pharmacy;Prohibited food (jello, ice cream, thin soups);Remove water pitcher   Follow up Recommendations Skilled Nursing facility      Frequency and Duration min 2x/week  2 weeks       Prognosis Prognosis for Safe Diet Advancement: Good      Swallow Study   General HPI: Patient is a 75 yo female  admitted 06/09/2016 due to severe mitral valve stenosis and tricuspid valve regurgitation, now s/p MVR, TVR, and Maze procedure same date, bronchoscopy 10/7 and 10/9, intubation 10/10-10/16 with pleurex placed.   PMH:  morbid obesity, HOH, poor eyesight, mitral stenosis, tricuspid regurg, rheumatic heart disease, anxiety, Afib, CAD, MI, CHF, venous insufficiency, chronic LE edema, HTN, CKD, asthma, chronic back pain Type of Study: Bedside Swallow Evaluation Previous Swallow Assessment: none in chart Diet Prior to this Study: NPO Temperature Spikes Noted: No Respiratory Status: Nasal cannula History of Recent Intubation: Yes Length of Intubations (days): 6 days Date extubated: 07/13/16 Behavior/Cognition: Alert;Cooperative;Pleasant mood;Requires cueing Oral Cavity Assessment: Within Functional Limits Oral Care Completed by SLP: No Oral Cavity - Dentition: Dentures, not available;Edentulous Vision: Functional for self-feeding Self-Feeding Abilities: Able to feed self;Needs assist Patient Positioning: Upright in chair Baseline Vocal Quality: Hoarse Volitional Cough: Strong Volitional Swallow: Able to elicit    Oral/Motor/Sensory Function Overall Oral Motor/Sensory Function: Within functional limits   Ice Chips Ice chips: Within functional limits   Thin Liquid Thin Liquid: Impaired Presentation: Cup;Spoon;Self Fed Pharyngeal  Phase Impairments: Cough - Immediate;Other (comments) (voice break x1)    Nectar Thick Nectar Thick Liquid: Impaired Presentation: Cup;Self Fed Pharyngeal Phase Impairments: Cough - Delayed   Honey Thick Honey Thick Liquid: Not tested  Puree Puree: Within functional limits Presentation: Spoon   Solid   GO   Solid: Not tested       Tollie Eth, Student SLP  [name] 07/15/2016,9:56 AM

## 2016-07-15 NOTE — Progress Notes (Signed)
PULMONARY / CRITICAL CARE MEDICINE   Name: Ann Potter MRN: [id number] DOB: September 16, 1941    ADMISSION DATE:  06/04/2016 CONSULTATION DATE:  07/05/2016  REFERRING MD:  Dr. Barry Dienes, TCTS  CHIEF COMPLAINT:  Short of breath  SUBJECTIVE:  Didn't need Bipap last night.  Down to 2 liters oxygen.  Denies chest pain.  Breathing better.  VITAL SIGNS: BP 101/70   Pulse 70   Temp 97.8 F (36.6 C) (Oral)   Resp 18   Ht 5\' 1"  (1.549 m)   Wt 249 lb 9 oz (113.2 kg)   SpO2 100%   BMI 47.15 kg/m   INTAKE / OUTPUT: I/O last 3 completed shifts: In: 1290.1 [P.O.:120; I.V.:1170.1] Out: 1093 [Urine:1018; Chest Tube:75]  PHYSICAL EXAMINATION: General:  Alert, sitting in chair Neuro:  Normal strength HEENT:  No stridor Cardiovascular:  Regular, 2/6 SM, wound site clean Lungs:  Diminished breath sounds, no wheeze Abdomen:  Soft, non tender Musculoskeletal:  No edema Skin:  No rashes  LABS:  BMET  Recent Labs Lab 07/14/16 0410 07/14/16 0456 07/15/16 0521  NA 135 138 135  K 4.0 4.6 6.2*  CL 96* 95* 95*  CO2 31 34* 29  BUN 36* 40* 48*  CREATININE 1.12* 1.17* 1.58*  GLUCOSE 254* 108* 101*    Electrolytes  Recent Labs Lab 07/12/16 0400 07/13/16 0409  07/14/16 0410 07/14/16 0456 07/15/16 0521  CALCIUM 8.0* 7.9*  < > 7.0* 7.9* 8.2*  MG 1.1* 2.2  --   --   --   --   PHOS 2.4*  --   --   --   --   --   < > = values in this interval not displayed.  CBC  Recent Labs Lab 07/14/16 0410 07/14/16 0456 07/15/16 0521  WBC 10.1 8.6 9.5  HGB 6.6* 10.2* 10.4*  HCT 20.8* 32.6* 33.1*  PLT 182 153 131*    Coag's  Recent Labs Lab 07/14/16 0410 07/14/16 0456 07/15/16 0521  INR 1.76 1.70 1.73    Sepsis Markers  Recent Labs Lab 07/10/16 1200 07/11/16 0430 07/12/16 0400  PROCALCITON 0.68 0.68 0.78    ABG  Recent Labs Lab 07/08/16 1012 07/11/16 1205  PHART 7.447 7.451*  PCO2ART 51.6* 49.1*  PO2ART 70.8* 78.2*    Liver Enzymes  Recent Labs Lab  07/10/16 0325  AST 28  ALT 17  ALKPHOS 108  BILITOT 1.7*  ALBUMIN 2.3*    Cardiac Enzymes No results for input(s): TROPONINI, PROBNP in the last 168 hours.  Glucose  Recent Labs Lab 07/14/16 1206 07/14/16 1637 07/14/16 1951 07/14/16 2323 07/15/16 0412 07/15/16 0916  GLUCAP 96 107* 105* 108* 103* 110*    Imaging Dg Chest Port 1 View  Result Date: 07/15/2016 CLINICAL DATA:  Atelectasis.  Short of breath. EXAM: PORTABLE CHEST 1 VIEW COMPARISON:  07/14/2016 FINDINGS: Bilateral pleural catheters unchanged in position. Small bilateral effusions unchanged. No pneumothorax. Pulmonary vascular congestion and mild edema has progressed. Bibasilar atelectasis unchanged. Right subclavian central venous catheter tip in the SVC unchanged. IMPRESSION: Increased bilateral airspace disease most consistent with pulmonary edema however infection cannot be excluded. Bibasilar atelectasis and effusion unchanged. Electronically Signed   By: Marlan Palau M.D.   On: 07/15/2016 09:18     STUDIES:  CT chest 10/06 >> Lt sided ATX/collapse with mucus plug CT angio chest 10/11 >> b/l pleural effusions  CULTURES: Sputum 10/06 >> oral flora BAL 10/09 >> oral flora BAL 10/10 >> oral flora Sputum 10/12 >> few Candida albicans  ANTIBIOTICS: Elita QuickFortaz 10/06 >> Vancomycin 10/09 >> 10/12 Vancomycin 10/15 >>   SIGNIFICANT EVENTS: 09/28 Admit; MV replacement, TV repair, MAZE 10/06 Atelectasis  10/07 Bronchoscopy at bedside 10/09 Bronchoscopy at bedside 10/10 To OR for ETT and bronchoscopy 10/12 b/l pleurx catheter placement, bronchoscopy in OR 10/16 Extubated >> intermittently needing Bipap  LINES/TUBES: ETT 9/28 >> 9/29 Rt PICC 10/02 >> ETT 10/10 >> 10/16  DISCUSSION: 75 yo female former smoker with rheumatic heart disease, mitral stenosis, chronic a fib.  She had MV replacement with bioprosthetic valve, TV annuloplasty, MAZE on 9/28.  Developed mucus plugging, requiring ETT for repeat  bronchoscopies.  Also developed b/l pleural effusions requiring b/l pleurx catheter placement.  She also has PMHx of CAD, diastolic CHF, HTN, HLD, Stage III CKD, Low back pain, Hypothyroidism,   ASSESSMENT / PLAN:  PULMONARY A: Acute respiratory failure 2nd to mucus plugging and b/l pleural effusions. P:   Oxygen to keep SpO2 90 to 95% F/u CXR intermittently Scheduled BDs for now >> likely can change to prn soon Bronchial hygiene Pleurx catheters per TCTS  CARDIOVASCULAR A:  Rheumatic heart disease s/p MVR and TV repair. A fib s/p MAZE. Hx of CAD, HTN, chronic diastolic dysfx. P:  Pressors, diuresis, anticoagulation per TCTS  RENAL A:   CKD 3. Hyperkalemia. P:   Monitor renal fx, urine outpt F/u BMET  GASTROINTESTINAL A:   Nutrition. Morbid obesity. P:   Will get speech to assess swallowing and advance diet  HEMATOLOGIC A:   Anemia of critical illness. P:  F/u CBC  INFECTIOUS A:   HCAP >> completed Abx 10/17. P:   Monitor off Abx  ENDOCRINE A:   Hx of hypothyroidism. P:   Continue synthroid  NEUROLOGIC A:   Deconditioning. P:   PT/OT assessment   PCCM will sign off.  Please call if additional help needed.   Coralyn HellingVineet Willamae Demby, MD [facility] 07/15/2016, 9:30 AM Pager:  [phone] After 3pm call: [phone]

## 2016-07-15 NOTE — Progress Notes (Signed)
      [address]       Ann Potter,[place] [id number]             [phone]        CARDIOTHORACIC SURGERY PROGRESS NOTE  R20Days Post-Op Procedure(s) (LRB): MITRAL VALVE (MV) REPLACEMENT (N/A) TRICUSPID VALVE REPAIR (N/A) MAZE (N/A) TRANSESOPHAGEAL ECHOCARDIOGRAM (TEE) (N/A)   R6 Days Post-Op Procedure(s) (LRB): INSERTION PLEURAL DRAINAGE CATHETER (Bilateral) VIDEO BRONCHOSCOPY (N/A)  Subjective: Looks good and reports feeling good.  Stood up w/ assistance this morning.  Wants breakfast.  Denies SOB  Objective: Vital signs: BP Readings from Last 1 Encounters:  07/15/16 (!) 127/107   Pulse Readings from Last 1 Encounters:  07/15/16 63   Resp Readings from Last 1 Encounters:  07/15/16 20   Temp Readings from Last 1 Encounters:  07/15/16 98.8 F (37.1 C) (Oral)    Hemodynamics:    Physical Exam:  Rhythm:   Afib w/ controlled rate  Breath sounds: clear  Heart sounds:  RRR  Incisions:  Clean and dry  Abdomen:  Soft, non-distended, non-tender  Extremities:  Warm, well-perfused, swollen   Intake/Output from previous day: 10/17 0701 - 10/18 0700 In: 701.6 [P.O.:120; I.V.:581.6] Out: 283 [Urine:208; Chest Tube:75] Intake/Output this shift: Total I/O In: 40.3 [I.V.:40.3] Out: -   Lab Results:  CBC: Recent Labs  07/14/16 0456 07/15/16 0521  WBC 8.6 9.5  HGB 10.2* 10.4*  HCT 32.6* 33.1*  PLT 153 131*    BMET:  Recent Labs  07/14/16 0456 07/15/16 0521  NA 138 135  K 4.6 6.2*  CL 95* 95*  CO2 34* 29  GLUCOSE 108* 101*  BUN 40* 48*  CREATININE 1.17* 1.58*  CALCIUM 7.9* 8.2*     PT/INR:   Recent Labs  07/15/16 0521  LABPROT 20.5*  INR 1.73    CBG (last 3)   Recent Labs  07/14/16 1951 07/14/16 2323 07/15/16 0412  GLUCAP 105* 108* 103*    ABG    Component Value Date/Time   PHART 7.451 (H) 07/11/2016 1205   PCO2ART 49.1 (H) 07/11/2016 1205   PO2ART 78.2 (L) 07/11/2016 1205   HCO3 33.9 (H) 07/11/2016 1205   TCO2 34  07/02/2016 1749   ACIDBASEDEF 3.0 (H) 06/26/2016 0859   O2SAT 76.8 07/14/2016 0740    CXR: stable  Assessment/Plan:  [id number] original surgery Acute on chronic respiratory failure, stable since extubation 2 days ago Breathing comfortably w/ O2 sats 100% on 2 L/min via [place] Maintaining stablerhythm andHR and BP although still on low dose dopamine for BP support Permanent atrial fibrillation now s/p maze procedure - HR stable and well controlled Acute on chronic diastolic CHF with expected post-op volume excess,I/O's positive 420 mL yesterday, weight still reportedly 9 lbs above preop baseline and down 4 lbs yesterday accurate - clinically intravascularly dry Acute kidney injury with rise in BUN/creatinine today c/w pre-renal azotemia due to intravascular volume depletion Hyperkalemia - likely due to supplementation yesterday while lasix off Expected post op acute blood loss anemia, Hgb stable10.4this morning Longstanding rheumatic heart disease  Pulmonary hypertension Morbid obesity Physical deconditioning - severe Protein-depleted malnutrition  Chronic venous insufficiency   Mobilize out of bed  Pulmonary toilet  Add 25% albumin to mobilize fluid  Recheck potassium later today  Continue to hold all diuretics for now   Continue coumadin   Ann Nailslarence H Shakiera Edelson, MD 07/15/2016 8:55 AM

## 2016-07-16 ENCOUNTER — Inpatient Hospital Stay (HOSPITAL_COMMUNITY): Payer: Medicare Other

## 2016-07-16 LAB — COMPREHENSIVE METABOLIC PANEL
ALT: 18 U/L (ref 14–54)
AST: 26 U/L (ref 15–41)
Albumin: 2.6 g/dL — ABNORMAL LOW (ref 3.5–5.0)
Alkaline Phosphatase: 113 U/L (ref 38–126)
Anion gap: 8 (ref 5–15)
BILIRUBIN TOTAL: 1.5 mg/dL — AB (ref 0.3–1.2)
BUN: 59 mg/dL — AB (ref 6–20)
CO2: 30 mmol/L (ref 22–32)
CREATININE: 1.93 mg/dL — AB (ref 0.44–1.00)
Calcium: 8.7 mg/dL — ABNORMAL LOW (ref 8.9–10.3)
Chloride: 100 mmol/L — ABNORMAL LOW (ref 101–111)
GFR, EST AFRICAN AMERICAN: 28 mL/min — AB (ref >60)
GFR, EST NON AFRICAN AMERICAN: 24 mL/min — AB (ref >60)
Glucose, Bld: 110 mg/dL — ABNORMAL HIGH (ref 65–99)
POTASSIUM: 5.6 mmol/L — AB (ref 3.5–5.1)
Sodium: 138 mmol/L (ref 135–145)
TOTAL PROTEIN: 5.7 g/dL — AB (ref 6.5–8.1)

## 2016-07-16 LAB — CBC
HEMATOCRIT: 30.2 % — AB (ref 36.0–46.0)
Hemoglobin: 9.4 g/dL — ABNORMAL LOW (ref 12.0–15.0)
MCH: 29.9 pg (ref 26.0–34.0)
MCHC: 31.1 g/dL (ref 30.0–36.0)
MCV: 96.2 fL (ref 78.0–100.0)
PLATELETS: 132 10*3/uL — AB (ref 150–400)
RBC: 3.14 MIL/uL — AB (ref 3.87–5.11)
RDW: 16.8 % — AB (ref 11.5–15.5)
WBC: 8.6 10*3/uL (ref 4.0–10.5)

## 2016-07-16 LAB — GLUCOSE, CAPILLARY
GLUCOSE-CAPILLARY: 99 mg/dL (ref 65–99)
Glucose-Capillary: 100 mg/dL — ABNORMAL HIGH (ref 65–99)
Glucose-Capillary: 119 mg/dL — ABNORMAL HIGH (ref 65–99)
Glucose-Capillary: 121 mg/dL — ABNORMAL HIGH (ref 65–99)
Glucose-Capillary: 127 mg/dL — ABNORMAL HIGH (ref 65–99)
Glucose-Capillary: 46 mg/dL — ABNORMAL LOW (ref 65–99)
Glucose-Capillary: 80 mg/dL (ref 65–99)

## 2016-07-16 LAB — PROTIME-INR
INR: 2.06
PROTHROMBIN TIME: 23.6 s — AB (ref 11.4–15.2)

## 2016-07-16 LAB — ACETYLCHOLINE RECEPTOR AB, ALL
Acety choline binding ab: <0.03 nmol/L (ref 0.00–0.24)
Acetylchol Block Ab: 20 % (ref 0–25)
Acetylcholine Modulat Ab: <12 % (ref 0–20)

## 2016-07-16 MED ORDER — PHENYLEPHRINE HCL 10 MG/ML IJ SOLN
0.0000 ug/min | INTRAVENOUS | Status: DC
  Administered 2016-07-16: 10 ug/min via INTRAVENOUS
  Filled 2016-07-16: qty 4

## 2016-07-16 MED ORDER — ALBUMIN HUMAN 5 % IV SOLN
INTRAVENOUS | Status: AC
  Administered 2016-07-17: 12.5 g via INTRAVENOUS
  Filled 2016-07-16: qty 250

## 2016-07-16 MED ORDER — DEXTROSE 50 % IV SOLN
INTRAVENOUS | Status: AC
  Administered 2016-07-16: 25 mL
  Filled 2016-07-16: qty 50

## 2016-07-16 MED ORDER — ALBUMIN HUMAN 5 % IV SOLN
12.5000 g | Freq: Once | INTRAVENOUS | Status: AC
  Administered 2016-07-17: 12.5 g via INTRAVENOUS

## 2016-07-16 NOTE — Progress Notes (Signed)
Physical Therapy Treatment Patient Details Name: Sharlotte Baka MRN: [id number] DOB: [birth year] Today's Date: 07/16/2016    History of Present Illness Patient is a 75 yo female admitted 06/05/2016 due to severe mitral valve stenosis and tricuspid valve regurgitation, now s/p MVR, TVR, and Maze procedure same date, bronchoscopy 10/7 and 10/9, intubation 10/10-10/16 with pleurex placed.   PMH:  morbid obesity, HOH, poor eyesight, mitral stenosis, tricuspid regurg, rheumatic heart disease, anxiety, Afib, CAD, MI, CHF, venous insufficiency, chronic LE edema, HTN, CKD, asthma, chronic back pain    PT Comments    Pt very pleasant and reports feeling better today since being able to eat. Pt was able to stand today with 2 person assist over repeated trials but not yet strong enough to attempt ambulation. Pt with sats dropping to 87% with activity on 2L and with 4L able to maintain 93% with return to 95% on 2L end of session. HR 70-73. Will continue to follow and recommend HEP throughout the day.   Follow Up Recommendations  SNF;Supervision/Assistance - 24 hour     Equipment Recommendations       Recommendations for Other Services       Precautions / Restrictions Precautions Precautions: Sternal;Fall Precaution Comments:  Reviewed sternal precautions pt able to state 3/5    Mobility  Bed Mobility               General bed mobility comments: pt in chair on arrival  Transfers Overall transfer level: Needs assistance   Transfers: Sit to/from Stand Sit to Stand: Mod assist;+2 physical assistance;+2 safety/equipment         General transfer comment: pt stood x 4 from recliner with cues for sequence, assist for rise from surface and increased time to fully clear sacrum. Pt with grossly 10, 45, 20, 5 sec in standing each trial. Pt denied attempting to pivot or step this session. To scoot back in chair required chair fully reclined and assist of 4 to lift and move pt  back  Ambulation/Gait                 Stairs            Wheelchair Mobility    Modified Rankin (Stroke Patients Only)       Balance                                    Cognition Arousal/Alertness: Awake/alert Behavior During Therapy: WFL for tasks assessed/performed Overall Cognitive Status: Within Functional Limits for tasks assessed       Memory: Decreased recall of precautions              Exercises      General Comments        Pertinent Vitals/Pain Pain Assessment: No/denies pain    Home Living                      Prior Function            PT Goals (current goals can now be found in the care plan section) Progress towards PT goals: Progressing toward goals    Frequency           PT Plan Current plan remains appropriate    Co-evaluation             End of Session Equipment Utilized During Treatment: Gait belt;Oxygen Activity Tolerance: Patient tolerated treatment well  Patient left: in chair;with call bell/phone within reach;with nursing/sitter in room     Time: 1024-1050 PT Time Calculation (min) (ACUTE ONLY): 26 min  Charges:  $Therapeutic Activity: 23-37 mins                    G Codes:      Delorse Lekabor, Shelene Krage Beth 07/16/2016, 12:07 PM  Delaney MeigsMaija Tabor Aaryanna Hyden, PT [phone]

## 2016-07-16 NOTE — Progress Notes (Signed)
[address]       [name] [address]             [phone]        CARDIOTHORACIC SURGERY PROGRESS NOTE  R21Days Post-Op Procedure(s) (LRB): MITRAL VALVE (MV) REPLACEMENT (N/A) TRICUSPID VALVE REPAIR (N/A) MAZE (N/A) TRANSESOPHAGEAL ECHOCARDIOGRAM (TEE) (N/A)   R7 Days Post-Op Procedure(s) (LRB): INSERTION PLEURAL DRAINAGE CATHETER (Bilateral) VIDEO BRONCHOSCOPY (N/A)  Subjective: No complaints.  Wants something to drink.  Reports feeling stronger.  Objective: Vital signs: BP Readings from Last 1 Encounters:  07/16/16 (!) 94/50   Pulse Readings from Last 1 Encounters:  07/16/16 66   Resp Readings from Last 1 Encounters:  07/16/16 (!) 22   Temp Readings from Last 1 Encounters:  07/16/16 98.6 F (37 C) (Oral)    Hemodynamics:    Physical Exam:  Rhythm:   Afib vs junctional  Breath sounds: Diminished at bases, otherwise clear  Heart sounds:  RRR  Incisions:  Clean and dry  Abdomen:  Soft, non-distended, non-tender  Extremities:  Warm, well-perfused    Intake/Output from previous day: 10/18 0701 - 10/19 0700 In: 1501.2 [P.O.:360; I.V.:941.2; IV Piggyback:200] Out: 150 [Urine:150] Intake/Output this shift: No intake/output data recorded.  Lab Results:  CBC: Recent Labs  07/15/16 0521 07/16/16 0452  WBC 9.5 8.6  HGB 10.4* 9.4*  HCT 33.1* 30.2*  PLT 131* 132*    BMET:  Recent Labs  07/15/16 1545 07/16/16 0452  NA 137 138  K 5.4* 5.6*  CL 103 100*  CO2 28 30  GLUCOSE 165* 110*  BUN 47* 59*  CREATININE 1.53* 1.93*  CALCIUM 7.4* 8.7*     PT/INR:   Recent Labs  07/16/16 0452  LABPROT 23.6*  INR 2.06    CBG (last 3)   Recent Labs  07/16/16 0047 07/16/16 0411 07/16/16 0719  GLUCAP 80 100* 99    ABG    Component Value Date/Time   PHART 7.451 (H) 07/11/2016 1205   PCO2ART 49.1 (H) 07/11/2016 1205   PO2ART 78.2 (L) 07/11/2016 1205   HCO3 33.9 (H) 07/11/2016 1205   TCO2 34 07/02/2016 1749   ACIDBASEDEF 3.0 (H) 06/26/2016 0859   O2SAT 76.8 07/14/2016 0740    CXR: PORTABLE CHEST 1 VIEW  COMPARISON:  Radiograph of July 15, 2016.  FINDINGS: Stable cardiomegaly. Sternotomy wires are noted. Status post cardiac valve repair. Right-sided PICC line is stable in position. Bilateral pleural drainage catheters are unchanged. Stable bilateral edema is noted. Stable left basilar opacity is noted most consistent with pleural effusion and associated atelectasis. Minimal right pleural effusion is noted and stable. Bony thorax is unremarkable.  IMPRESSION: Stable position of bilateral pleural drainage catheters with stable pleural effusions, left greater than right. Stable bilateral interstitial densities are noted concerning for edema.   Electronically Signed   By: Lupita Raider, M.D.   On: 07/16/2016 08:01  Assessment/Plan:  [id number] original surgery Acute on chronic respiratory failure, stable since extubation 3 days ago Breathing comfortably w/ O2 sats 94-100% on 2 L/min via [place] Maintaining stablerhythm andHR and BP although still on low dosedopamine for BP support Permanent atrial fibrillation now s/p maze procedure - HR stable and well controlled Acute on chronic diastolic CHF with expected post-op volume excess,I/O's positive 1500 mL yesterday, weight still reportedly 15lbs above preop baseline and up 6 lbs yesterday accurate - clinically still looks intravascularly dry Acute kidney injury with rise in BUN/creatinine today c/w pre-renal azotemia due to  intravascular volume depletion Hyperkalemia - stable Expected post op acute blood loss anemia, Hgb 9.4this morning Longstanding rheumatic heart disease  Pulmonary hypertension Morbid obesity Physical deconditioning - severe Protein-depleted malnutrition  Chronic venous insufficiency   Mobilize out of bed  Pulmonary toilet  Continue 25% albumin to mobilize fluid and consider adding gentle IV  hydration if oral intake inadequate  Continue low dose dopamine for BP support   Continue to hold all diuretics for now   Replace Foley to monitor UOP  Consider nephrology consult if renal function gets any worse  Check f/u ECHO  Continue coumadin   Purcell Nailslarence H Owen, MD 07/16/2016 8:02 AM

## 2016-07-16 NOTE — Progress Notes (Signed)
Right and Left PleurX catheters drained as per orders.  Rt. PleurX catheter drained 300cc.  Left PleuX catheter drained 5cc,  Dressing replaced.  Edilia Bohristine Karma Ansley, RN

## 2016-07-16 NOTE — Care Management Important Message (Signed)
Important Message  Patient Details  Name: Ann PoeGloria Potter MRN: [id number] Date of Birth: [birth year]   Medicare Important Message Given:  Yes    Ann Potter [name] 07/16/2016, 10:39 AM

## 2016-07-16 NOTE — Progress Notes (Signed)
SLP Cancellation Note  Patient Details Name: Ann Potter MRN: [id number] DOB: December 02, 1940   Cancelled treatment:       Reason Eval/Treat Not Completed: Other (comment) Attempted to see pt x2, but she was with other providers. Will continue efforts.   Maxcine Hamaiewonsky, Arietta Eisenstein 07/16/2016, 4:14 PM  Maxcine HamLaura Paiewonsky, M.A. CCC-SLP [phone]

## 2016-07-16 NOTE — Progress Notes (Signed)
Patient ID: Ann Potter, female   DOB: [birth year], 75 y.o.   MRN: [id number]  SICU Evening Rounds:  She has had a borderline BP today around 80-90 and borderline urine output since foley inserted with rise in creat. She is getting 25% albumin to try to draw fluid back into her vasculature. Creat up this am. I think her urine output and creat are going to suffer if her BP remains this low. She is on dopamine 5. Will add some neo to try to keep SBP over 100.   sats 96% on 2L.  Repeat labs in the am.

## 2016-07-16 NOTE — Progress Notes (Signed)
Every other suture removed from Chest incision as per orders.  Tolerated well.  Sutures removed from old chest tube sites as per orders.  Tolerated well.

## 2016-07-17 ENCOUNTER — Inpatient Hospital Stay (HOSPITAL_COMMUNITY): Payer: Medicare Other

## 2016-07-17 DIAGNOSIS — I5033 Acute on chronic diastolic (congestive) heart failure: Secondary | ICD-10-CM

## 2016-07-17 DIAGNOSIS — I482 Chronic atrial fibrillation: Secondary | ICD-10-CM

## 2016-07-17 DIAGNOSIS — J9620 Acute and chronic respiratory failure, unspecified whether with hypoxia or hypercapnia: Secondary | ICD-10-CM

## 2016-07-17 LAB — GLUCOSE, CAPILLARY
GLUCOSE-CAPILLARY: 116 mg/dL — AB (ref 65–99)
GLUCOSE-CAPILLARY: 120 mg/dL — AB (ref 65–99)
Glucose-Capillary: 117 mg/dL — ABNORMAL HIGH (ref 65–99)
Glucose-Capillary: 122 mg/dL — ABNORMAL HIGH (ref 65–99)
Glucose-Capillary: 126 mg/dL — ABNORMAL HIGH (ref 65–99)
Glucose-Capillary: 133 mg/dL — ABNORMAL HIGH (ref 65–99)
Glucose-Capillary: 147 mg/dL — ABNORMAL HIGH (ref 65–99)

## 2016-07-17 LAB — ECHOCARDIOGRAM COMPLETE
AO mean calculated velocity dopler: 171 cm/s
AV Area VTI index: 0.81 cm2/m2
AV Area mean vel: 1.76 cm2
AV Mean grad: 14 mmHg
AV Peak grad: 27 mmHg
AV VEL mean LVOT/AV: 0.62
AV area mean vel ind: 0.84 cm2/m2
AV peak Index: 0.77
AV pk vel: 258 cm/s
AV vel: 1.71
AVAREAVTI: 1.61 cm2
AVLVOTPG: 9 mmHg
AVPHT: 357 ms
Ao pk vel: 0.57 m/s
Area-P 1/2: 2.37 cm2
CHL CUP DOP CALC LVOT VTI: 31.1 cm
CHL CUP LVOT MV VTI INDEX: 0.96 cm2/m2
CHL CUP TV REG PEAK VELOCITY: 278 cm/s
E decel time: 254 msec
FS: 44 % (ref 28–44)
HEIGHTINCHES: 61 in
IVS/LV PW RATIO, ED: 0.96
LA ID, A-P, ES: 43 mm
LADIAMINDEX: 2.05 cm/m2
LAVOL: 69.4 mL
LAVOLA4C: 75.9 mL
LAVOLIN: 33 mL/m2
LDCA: 2.84 cm2
LEFT ATRIUM END SYS DIAM: 43 mm
LV PW d: 16.6 mm — AB (ref 0.6–1.1)
LVOT MV VTI: 2.01
LVOT SV: 88 mL
LVOTD: 19 mm
LVOTPV: 146 cm/s
LVOTVTI: 0.6 cm
MV Dec: 254
MV M vel: 72.4
MV Peak grad: 16 mmHg
MVANNULUSVTI: 44 cm
MVPKEVEL: 197 m/s
MVSPHT: 93 ms
Mean grad: 3 mmHg
RV TAPSE: 14.1 mm
TRMAXVEL: 278 cm/s
VTI: 51.8 cm
Valve area index: 0.81
Valve area: 1.71 cm2
WEIGHTICAEL: 4112.9 [oz_av]

## 2016-07-17 LAB — PROTIME-INR
INR: 2.33
Prothrombin Time: 26 seconds — ABNORMAL HIGH (ref 11.4–15.2)

## 2016-07-17 LAB — CBC
HEMATOCRIT: 28.7 % — AB (ref 36.0–46.0)
HEMOGLOBIN: 8.9 g/dL — AB (ref 12.0–15.0)
MCH: 30.2 pg (ref 26.0–34.0)
MCHC: 31 g/dL (ref 30.0–36.0)
MCV: 97.3 fL (ref 78.0–100.0)
Platelets: 129 10*3/uL — ABNORMAL LOW (ref 150–400)
RBC: 2.95 MIL/uL — ABNORMAL LOW (ref 3.87–5.11)
RDW: 17 % — AB (ref 11.5–15.5)
WBC: 8.5 10*3/uL (ref 4.0–10.5)

## 2016-07-17 LAB — COOXEMETRY PANEL
Carboxyhemoglobin: 2.4 % — ABNORMAL HIGH (ref 0.5–1.5)
Carboxyhemoglobin: 2.2 % — ABNORMAL HIGH (ref 0.5–1.5)
METHEMOGLOBIN: 0.5 % (ref 0.0–1.5)
Methemoglobin: 1 % (ref 0.0–1.5)
O2 SAT: 64.8 %
O2 Saturation: 76.7 %
TOTAL HEMOGLOBIN: 8.3 g/dL — AB (ref 12.0–16.0)
Total hemoglobin: 14 g/dL (ref 12.0–16.0)

## 2016-07-17 LAB — BASIC METABOLIC PANEL
ANION GAP: 10 (ref 5–15)
BUN: 62 mg/dL — AB (ref 6–20)
CO2: 28 mmol/L (ref 22–32)
Calcium: 8.8 mg/dL — ABNORMAL LOW (ref 8.9–10.3)
Chloride: 99 mmol/L — ABNORMAL LOW (ref 101–111)
Creatinine, Ser: 2.01 mg/dL — ABNORMAL HIGH (ref 0.44–1.00)
GFR calc Af Amer: 27 mL/min — ABNORMAL LOW (ref >60)
GFR, EST NON AFRICAN AMERICAN: 23 mL/min — AB (ref >60)
Glucose, Bld: 117 mg/dL — ABNORMAL HIGH (ref 65–99)
POTASSIUM: 5.3 mmol/L — AB (ref 3.5–5.1)
SODIUM: 137 mmol/L (ref 135–145)

## 2016-07-17 MED ORDER — FUROSEMIDE 10 MG/ML IJ SOLN
15.0000 mg/h | INTRAVENOUS | Status: DC
  Administered 2016-07-17: 8 mg/h via INTRAVENOUS
  Administered 2016-07-18 – 2016-07-21 (×5): 15 mg/h via INTRAVENOUS
  Filled 2016-07-17 (×12): qty 25

## 2016-07-17 MED ORDER — SODIUM CHLORIDE 0.9 % IV SOLN: INTRAVENOUS | Status: DC | PRN

## 2016-07-17 MED ORDER — NOREPINEPHRINE BITARTRATE 1 MG/ML IV SOLN
10.0000 ug/min | INTRAVENOUS | Status: DC
  Administered 2016-07-17 – 2016-07-24 (×3): 10 ug/min via INTRAVENOUS
  Filled 2016-07-17 (×3): qty 16

## 2016-07-17 MED ORDER — WARFARIN SODIUM 3 MG PO TABS
4.0000 mg | ORAL_TABLET | Freq: Every day | ORAL | Status: DC
  Filled 2016-07-17: qty 0.5

## 2016-07-17 MED ORDER — ALBUMIN HUMAN 25 % IV SOLN
12.5000 g | Freq: Four times a day (QID) | INTRAVENOUS | Status: DC
  Administered 2016-07-17 – 2016-07-18 (×5): 12.5 g via INTRAVENOUS
  Filled 2016-07-17 (×5): qty 50

## 2016-07-17 MED ORDER — WARFARIN SODIUM 4 MG PO TABS
4.0000 mg | ORAL_TABLET | Freq: Every day | ORAL | Status: DC
  Administered 2016-07-17: 4 mg via ORAL
  Filled 2016-07-17: qty 1

## 2016-07-17 MED ORDER — DOBUTAMINE IN D5W 4-5 MG/ML-% IV SOLN
5.0000 ug/kg/min | INTRAVENOUS | Status: DC
  Administered 2016-07-17: 2 ug/kg/min via INTRAVENOUS
  Administered 2016-07-20 – 2016-07-22 (×2): 5 ug/kg/min via INTRAVENOUS
  Filled 2016-07-17 (×5): qty 250

## 2016-07-17 MED ORDER — NOREPINEPHRINE BITARTRATE 1 MG/ML IV SOLN
10.0000 ug/min | INTRAVENOUS | Status: DC
  Administered 2016-07-17: 12 ug/min via INTRAVENOUS
  Administered 2016-07-17: 10 ug/min via INTRAVENOUS
  Filled 2016-07-17 (×2): qty 4

## 2016-07-17 NOTE — Progress Notes (Signed)
PT Cancellation Note  Patient Details Name: Ann Potter MRN: [id number] DOB: 1940/10/03   Cancelled Treatment:    Reason Eval/Treat Not Completed: Medical issues which prohibited therapy (BP 87/36 with MAP of 50 on pressors)   [name], Keyri Salberg Beth 07/17/2016, 11:23 AM  Delaney MeigsMaija Tabor Quida Glasser, PT [phone]

## 2016-07-17 NOTE — Procedures (Signed)
Called to room by RN due to spo2 82%, on arrival to room pt on 6L [place] spo2 87%, pt placed on Bipap at this time, tolerating well, RT will monitior

## 2016-07-17 NOTE — Consult Note (Signed)
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 TEE on 9/28 EF 55-60%. Post-operative course complicated by respiratory failure, AKI, hypotension, and marked volume overload. Extubated but required reintubation 10/10. Later extubated 10/19.  She has had 3 bronchoscopies --> 10/7, 10/9, 10/12. Pleurex catheters placed  10/12 on R and L.   Hypotensive this morning phenylephrine stopped and dobutamine started at 10 mcg and continued on dopamine at 5 mcg. Today she was started on lasix drip 8 mg per hour. Sluggish urine output noted. Earlier this morning she was on 4 liters [place] but now requiring Bipap for O2 Sats 86%. Pleurex catheter drained just now with  R 500 cc and L 50 cc. Creatinine trending up over the last 3 days. 1.17> 2.0.   SOB at rest. Sluggish urine output.   Review of Systems: [y] = yes, [ ]  = no   General: Weight gain [Y ]; Weight loss [ ] ; Anorexia [ ] ; Fatigue [Y ]; Fever [ ] ; Chills [ ] ; Weakness [Y ]  Cardiac: Chest pain/pressure [ ] ; Resting SOB [Y ]; Exertional SOB [Y ]; Orthopnea [ ] ; Pedal Edema [Y ]; Palpitations [ ] ; Syncope [ ] ; Presyncope [ ] ; Paroxysmal nocturnal dyspnea[ ]   Pulmonary: Cough [ ] ; Wheezing[ ] ; Hemoptysis[ ] ; Sputum [ ] ; Snoring [ ]   GI: Vomiting[ ] ; Dysphagia[ ] ; Melena[ ] ; Hematochezia [ ] ; Heartburn[ ] ; Abdominal pain [ ] ; Constipation [ ] ; Diarrhea [ ] ; BRBPR [ ]   GU: Hematuria[ ] ; Dysuria [ ] ; Nocturia[ ]   Vascular: Pain in legs with walking [ ] ; Pain in feet with lying flat [ ] ; Non-healing sores [ ] ; Stroke [ ] ; TIA [ ] ; Slurred speech [ ] ;  Neuro: Headaches[ ] ; Vertigo[ ] ; Seizures[ ] ; Paresthesias[  ];Blurred vision [ ] ; Diplopia [ ] ; Vision changes [ ]   Ortho/Skin: Arthritis [ ] ; Joint pain [Y ]; Muscle pain [ ] ; Joint swelling [ ] ; Back Pain [ Y]; Rash [ ]   Psych: Depression[ ] ; Anxiety[Y ]  Heme: Bleeding problems [ ] ; Clotting disorders [ ] ; Anemia [Y ]  Endocrine: Diabetes [Y ]; Thyroid dysfunction[Y ]  Home Medications Prior to Admission medications   Medication Sig Start Date End Date Taking? Authorizing Provider  acetaminophen (TYLENOL) 650 MG CR tablet Take 650 mg by mouth every 8 (eight) hours as needed for pain.   Yes Historical Provider, MD  albuterol (PROVENTIL HFA;VENTOLIN HFA) 108 (90 BASE) MCG/ACT inhaler Inhale 2 puffs into the lungs every 6 (six) hours as needed. For shortness of breath   Yes Historical Provider, MD  diltiazem (CARDIZEM) 30 MG tablet Take 1 tablet (30 mg total) by mouth 2 (two) times daily. 07/03/15  Yes Antoine Poche, MD  Fluticasone-Salmeterol (ADVAIR) 250-50 MCG/DOSE AEPB Inhale 1 puff into the lungs 2 (two) times daily.   Yes Historical Provider, MD  levothyroxine (SYNTHROID, LEVOTHROID) 50 MCG tablet Take 50 mcg by mouth daily.   Yes Historical Provider, MD  loratadine (CLARITIN) 10 MG tablet Take 10 mg by mouth as needed for allergies.    Yes Historical Provider, MD  metolazone (ZAROXOLYN) 2.5 MG tablet Take 1 tab every  other week;may take weekly for additional swelling as needed. Patient taking differently: Take 2.5 mg by mouth See admin instructions. Take 2.5 mg by mouth every other week;may take weekly for additional swelling as needed. 05/21/16  Yes Antoine Poche, MD  montelukast (SINGULAIR) 10 MG tablet Take 10 mg by mouth daily.    Yes Historical Provider, MD  potassium chloride SA (K-DUR,KLOR-CON) 20 MEQ tablet Take 2 tablets (40 mEq total) by mouth 2 (two) times daily. 04/17/16  Yes Arty Baumgartner, NP  rivaroxaban (XARELTO) 20 MG TABS tablet Take 1 tablet (20 mg total) by mouth daily with supper. 10/01/15  Yes Antoine Poche, MD   rosuvastatin (CRESTOR) 10 MG tablet Take 10 mg by mouth daily.   Yes Historical Provider, MD  torsemide (DEMADEX) 20 MG tablet Take 4 tablets (80 mg total) by mouth 2 (two) times daily. 04/08/16  Yes Antoine Poche, MD    Past Medical History: Past Medical History:  Diagnosis Date  . Anxiety   . Arthritis    "feet & legs" (04/16/2016)  . Asthma   . Atrial fibrillation (HCC)   . Atrial fibrillation, chronic (HCC) 01/25/2012   Status post DCCV, June 2013  . CAD (coronary artery disease)    Nonobstructive, by catheterization 4/13  . CHF (congestive heart failure) (HCC)   . Chronic diastolic congestive heart failure (HCC) 01/25/2012  . Chronic lower back pain   . Chronic renal insufficiency, stage III (moderate) 01/25/2012  . Chronic venous insufficiency   . COPD (chronic obstructive pulmonary disease) (HCC)   . Heart murmur   . HLD (hyperlipidemia)   . HTN (hypertension)   . Hypothyroidism   . Lower extremity edema   . Macular degeneration   . Mitral stenosis    Mild, by catheterization 4/13  . Morbid obesity (HCC)   . Myocardial infarction 2007  . Pulmonary hypertension    Moderate, by catheterization 4/13  . Rheumatic fever during childhood    Age 80-8  . Rheumatic heart disease   . S/P Maze operation for atrial fibrillation June 26, 2016   Complete bilateral atrial lesion set using cryothermy and bipolar radiofrequency ablation with clipping of LA appendage  . S/P mitral valve replacement with bioprosthetic valve 2016-06-26   29 mm [facility] Mitral bovine pericardial tissue valve  . S/P tricuspid valve repair June 26, 2016   26 mm [place] mc3 ring annuloplasty  . Tricuspid regurgitation   . Worries     Past Surgical History: Past Surgical History:  Procedure Laterality Date  . CARDIAC CATHETERIZATION  12/2011; 04/16/2016  . CARDIAC CATHETERIZATION N/A 04/16/2016   Procedure: Right/Left Heart Cath and Coronary Angiography;  Surgeon: Tonny Bollman, MD;  Location: [facility] INVASIVE  CV LAB;  Service: Cardiovascular;  Laterality: N/A;  . CARDIOVERSION    . CATARACT EXTRACTION W/ INTRAOCULAR LENS  IMPLANT, BILATERAL Bilateral   . CHEST TUBE INSERTION Bilateral 07/27/2016   Procedure: INSERTION PLEURAL DRAINAGE CATHETER;  Surgeon: Purcell Nails, MD;  Location: [facility] OR;  Service: Thoracic;  Laterality: Bilateral;  . MAZE N/A 06-26-16   Procedure: MAZE;  Surgeon: Purcell Nails, MD;  Location: [facility] OR;  Service: Open Heart Surgery;  Laterality: N/A;  . MITRAL VALVE REPLACEMENT N/A 06/26/2016   Procedure: MITRAL VALVE (MV) REPLACEMENT;  Surgeon: Purcell Nails, MD;  Location: [facility] OR;  Service: Open Heart Surgery;  Laterality: N/A;  . MULTIPLE TOOTH EXTRACTIONS  1965  . TEE WITH CARDIOVERSION     3 year ago  .  TEE WITHOUT CARDIOVERSION N/A 06/25/2015   Procedure: TRANSESOPHAGEAL ECHOCARDIOGRAM (TEE);  Surgeon: Antoine Poche, MD;  Location: [facility] ENDO SUITE;  Service: Cardiology;  Laterality: N/A;  moved to 8:30 - Terri calling pt & Bernie  . TEE WITHOUT CARDIOVERSION N/A 06/13/2016   Procedure: TRANSESOPHAGEAL ECHOCARDIOGRAM (TEE);  Surgeon: Purcell Nails, MD;  Location[facility] OR;  Service: Open Heart Surgery;  Laterality: N/A;  . TRICUSPID VALVE REPLACEMENT N/A 06/15/2016   Procedure: TRICUSPID VALVE REPAIR;  Surgeon: Purcell Nails, MD;  Location: [facility] OR;  Service: Open Heart Surgery;  Laterality: N/A;  . TUBAL LIGATION  1984  . VIDEO BRONCHOSCOPY N/A 07/21/2016   Procedure: VIDEO BRONCHOSCOPY/ INTUBATION/ REMOVAL OF SECRETIONS;  Surgeon: Delight Ovens, MD;  Location: [facility] OR;  Service: Thoracic;  Laterality: N/A;  . VIDEO BRONCHOSCOPY N/A 07/02/2016   Procedure: VIDEO BRONCHOSCOPY;  Surgeon: Purcell Nails, MD;  Location: [facility] OR;  Service: Thoracic;  Laterality: N/A;    Family History: Family History  Problem Relation Age of Onset  . Heart disease Father 73  . Deep vein thrombosis Father   . Cancer Mother     right breast   . Cancer Sister     brain cancer  . Stomach  cancer Brother     Social History: Social History   Social History  . Marital status: Divorced    Spouse name: N/A  . Number of children: N/A  . Years of education: N/A   Social History Main Topics  . Smoking status: Former Smoker    Packs/day: 1.00    Years: 11.00    Types: Cigarettes    Start date: 03/08/1960  . Smokeless tobacco: Never Used     Comment: "quit smoking in ~  1990"  . Alcohol use No  . Drug use: No  . Sexual activity: Not Currently   Other Topics Concern  . None   Social History Narrative   Has 2 grown children   Lives alone    Allergies:  Allergies  Allergen Reactions  . Relafen [Nabumetone] Other (See Comments)    Bladder pain    Objective:    Vital Signs:   Temp:  [97.5 F (36.4 C)-98.7 F (37.1 C)] 97.5 F (36.4 C) (10/20 1145) Pulse Rate:  [63-142] 91 (10/20 1130) Resp:  [18-25] 24 (10/20 1130) BP: (61-114)/(26-80) 92/33 (10/20 1130) SpO2:  [65 %-100 %] 91 % (10/20 1130) FiO2 (%):  [40 %] 40 % (10/20 0109) Weight:  [257 lb 0.9 oz (116.6 kg)] 257 lb 0.9 oz (116.6 kg) (10/20 0600) Last BM Date: 07/12/16  Weight change: Filed Weights   07/15/16 0600 07/16/16 0630 07/17/16 0600  Weight: 249 lb 9 oz (113.2 kg) 255 lb 8.2 oz (115.9 kg) 257 lb 0.9 oz (116.6 kg)    Intake/Output:   Intake/Output Summary (Last 24 hours) at 07/17/16 1203 Last data filed at 07/17/16 0908  Gross per 24 hour  Intake          1584.85 ml  Output              670 ml  Net           914.85 ml     Physical Exam: CVP 20-22 General:  Chronically ill appearing. On Bipap  HEENT: normal Neck: supple. JVP difficult to assess due to Bipap. Carotids 2+ bilat; no bruits. No lymphadenopathy or thryomegaly appreciated. Cor: PMI nondisplaced. Irregular  rate & rhythm. No rubs, gallops or murmurs.Sternal incision approximated.  Lungs:  R and LLL decreased Abdomen: obese, soft, nontender, nondistended. No hepatosplenomegaly. No bruits or masses. Good bowel  sounds. Extremities: no cyanosis, clubbing, rash, R and LLE 3+ edema Neuro: alert & orientedx3, cranial nerves grossly intact. moves all 4 extremities w/o difficulty. Affect pleasant GU: Foley Skin: Erythema with satellite lesions   Telemetry: A fib 70s   Labs: Basic Metabolic Panel:  Recent Labs Lab 07/12/16 0400 07/13/16 0409  07/14/16 0456 07/15/16 0521 07/15/16 1545 07/16/16 0452 07/17/16 0500  NA 138 138  < > 138 135 137 138 137  K 4.1 4.2  < > 4.6 6.2* 5.4* 5.6* 5.3*  CL 95* 96*  < > 95* 95* 103 100* 99*  CO2 33* 33*  < > 34* 29 28 30 28   GLUCOSE 145* 155*  < > 108* 101* 165* 110* 117*  BUN 32* 37*  < > 40* 48* 47* 59* 62*  CREATININE 1.16* 1.12*  < > 1.17* 1.58* 1.53* 1.93* 2.01*  CALCIUM 8.0* 7.9*  < > 7.9* 8.2* 7.4* 8.7* 8.8*  MG 1.1* 2.2  --   --   --   --   --   --   PHOS 2.4*  --   --   --   --   --   --   --   < > = values in this interval not displayed.  Liver Function Tests:  Recent Labs Lab 07/16/16 0452  AST 26  ALT 18  ALKPHOS 113  BILITOT 1.5*  PROT 5.7*  ALBUMIN 2.6*   No results for input(s): LIPASE, AMYLASE in the last 168 hours. No results for input(s): AMMONIA in the last 168 hours.  CBC:  Recent Labs Lab 07/14/16 0410 07/14/16 0456 07/15/16 0521 07/16/16 0452 07/17/16 0500  WBC 10.1 8.6 9.5 8.6 8.5  HGB 6.6* 10.2* 10.4* 9.4* 8.9*  HCT 20.8* 32.6* 33.1* 30.2* 28.7*  MCV 95.4 96.4 96.2 96.2 97.3  PLT 182 153 131* 132* 129*    Cardiac Enzymes: No results for input(s): CKTOTAL, CKMB, CKMBINDEX, TROPONINI in the last 168 hours.  BNP: BNP (last 3 results) No results for input(s): BNP in the last 8760 hours.  ProBNP (last 3 results) No results for input(s): PROBNP in the last 8760 hours.   CBG:  Recent Labs Lab 07/16/16 1913 07/17/16 0007 07/17/16 0402 07/17/16 0730 07/17/16 1143  GLUCAP 119* 126* 117* 122* 116*    Coagulation Studies:  Recent Labs  07/15/16 0521 07/16/16 0452 07/17/16 0500  LABPROT 20.5*  23.6* 26.0*  INR 1.73 2.06 2.33    Other results: EKG: A fib   Imaging: Dg Chest Port 1 View  Result Date: 07/17/2016 CLINICAL DATA:  Shortness of Breath EXAM: PORTABLE CHEST 1 VIEW COMPARISON:  July 16, 2016. FINDINGS: There is a pleural drainage catheter on each side, unchanged in position. Central catheter tip is in the superior vena cava. No pneumothorax. Small pleural effusions remain. There is interstitial edema with airspace consolidation in the left lower lobe. There is cardiomegaly with pulmonary venous hypertension. No adenopathy evident. There are mitral and tricuspid valve replacements as well as a left atrial appendage clamp. No bone lesions are evident. IMPRESSION: Persistent changes of congestive heart failure. Persistent airspace consolidation left lower lobe. Suspect pneumonia superimposed on congestive heart failure. Stable cardiac silhouette. No appreciable change from 1 day prior. Electronically Signed   By: Bretta Bang III M.D.   On: 07/17/2016 08:41   Dg Chest Port 1 View  Result Date: 07/16/2016 CLINICAL DATA:  Shortness of breath, atelectasis. EXAM: PORTABLE CHEST 1 VIEW COMPARISON:  Radiograph of July 15, 2016. FINDINGS: Stable cardiomegaly. Sternotomy wires are noted. Status post cardiac valve repair. Right-sided PICC line is stable in position. Bilateral pleural drainage catheters are unchanged. Stable bilateral edema is noted. Stable left basilar opacity is noted most consistent with pleural effusion and associated atelectasis. Minimal right pleural effusion is noted and stable. Bony thorax is unremarkable. IMPRESSION: Stable position of bilateral pleural drainage catheters with stable pleural effusions, left greater than right. Stable bilateral interstitial densities are noted concerning for edema. Electronically Signed   By: Lupita RaiderJames  Green Jr, M.D.   On: 07/16/2016 08:01      Medications:     Current Medications: . albumin human  12.5 g Intravenous Q6H   . alteplase  2 mg Intracatheter Once  . bisacodyl  10 mg Oral Daily   Or  . bisacodyl  10 mg Rectal Daily  . feeding supplement  1 Container Oral TID BM  . feeding supplement (PRO-STAT SUGAR FREE 64)  30 mL Oral BID  . insulin aspart  0-15 Units Subcutaneous Q4H  . ipratropium  0.5 mg Nebulization Q6H  . levalbuterol  1.25 mg Nebulization Q6H  . levothyroxine  50 mcg Oral QAC breakfast  . mouth rinse  15 mL Mouth Rinse Q12H  . sodium chloride flush  10-40 mL Intracatheter Q12H  . sodium chloride flush  3 mL Intravenous Q12H  . warfarin  4 mg Oral q1800  . Warfarin - Physician Dosing Inpatient   Does not apply q1800     Infusions: . sodium chloride 30 mL/hr at 07/17/16 0800  . DOBUTamine 7 mcg/kg/min (07/17/16 1106)  . DOPamine 4.985 mcg/kg/min (07/17/16 0800)  . furosemide (LASIX) infusion 8 mg/hr (07/17/16 0923)  . phenylephrine (NEO-SYNEPHRINE) Adult infusion Stopped (07/17/16 0909)      Assessment:   1. S/P bioprosthetic MVR, TV repair 2016-06-10 - Complicated post operative course.  Dr Shirlee LatchMcLean reviewed ECHO--->LVEF 55-60%, moderate posterior pericardial effusion. MVR/TV repair ok. RV mildly dilated and mildly reduced systolic function.     2. A/C Diastolic Heart Failure  Repeat ECHO pending. Marked volume overload. CVP 22. Increase lasix drip to 15 mg per hour. CO-OX this morning 64%. Repeat CO-OX now.  Stop dopamine start norepi at 10 mcg and continue dobutamine 10 mcg. Worsening renal function.  3. A/C Respiratory Failure-  Extubated post operatively but re intubated on 10/10 and later extubated 10/19. Was on 4 liters earlier this morning but O2 sats dropped to 85-86%. Now on Bipap 4.  AKI-  Creatinine trending up 1.17>2.0. Likely worsening with hypotension  6.  Hypotension - Afebrile. WBC ok. Check UA. Adding norepi as above.  Will get blood cultures though no definite evidence for infection. 7. Pleural Effusions  R /L Pleurex Catheter- R 500 cc L 50cc  8. Chronic A fib-  Rate controlled. On coumadin. Todays INR 2.33 9. DMII- On sliding scale.  10. Hyperthyroidism- on levothyroxine 11. Skin - Rash under breast- ? Candidiasis. May need antifungal coverage. WOC consult  Length of Stay: 22  Amy Clegg NP-C  07/17/2016, 12:03 PM  Advanced Heart Failure Team Pager [phone] (M-F; 7a - 4p)  Please contact [facility] for night-coverage after hours (4p -7a ) and weekends on [email or web address]  Patient seen with NP, agree with the above note.  Echo from today reviewed: EF 60-65% with septal bounce noted, mildly dilated RV with mildly decreased systolic function, D-shaped interventricular septum suggesting RV pressure/volume overload,  bioprosthetic MV and repaired TV appear to function normally.   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  IVC was not seen.   1. Hypotension: Patient is currently hypotensive, she has been on dopamine + dobutamine.  Echo was reviewed as above, LV EF is normal.  RV function is reduced but not markedly so.  Some concern for effusive-constrictive pericarditis based on septal bounce but mitral inflow respirometry is not consistent with this.  Co-ox is 76%, adequate.  No evidence for infection so does not seem like septic shock.  - For now, would stop dopamine and use norepinephrine.  Can likely titrate down some on dobutamine, will decrease to 5.  - Think arterial line would be helpful.  - Send blood cultures.  - I suspect that she will need RHC.  2. Acute on chronic diastolic CHF with RV failure: CVP 22.  Increasing Lasix gtt to 15 mg/hr.  Will need to follow creatinine closely.  3. AKI: Creatinine up to 2, suspect hemodynamically-mediated.  Follow with diuresis.  4. 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  Will follow, suspect will need RHC.  5. S/p bioprosthetic MV: Valve appears to function normally on echo.  6. Atrial fibrillation: Persistent, s/p Maze but back in afib.  On warfarin.  7. Pleural effusions bilaterally: Pleurx catheters.  8. Pulmonary: CXR today with CHF, cannot rule out PNA.  Has Pleurx catheters in place.  Currently on Bipap, needs more diuresis.  As above, increased Lasix gtt.   Marca Ancona 07/17/2016 3:27 PM

## 2016-07-17 NOTE — Progress Notes (Signed)
OT Cancellation Note  Patient Details Name: Sherle PoeGloria Kelleher MRN: [id number] DOB: [birth year]   Cancelled Treatment:    Reason Eval/Treat Not Completed: Medical issues which prohibited therapy.  Pt hypotensive, now on BiPAP.  Will reattempt.   Edvardo Honse Monticelloonarpe, OTR/L [phone]   Jeani HawkingConarpe, Deverick Pruss M 07/17/2016, 1:03 PM

## 2016-07-17 NOTE — Progress Notes (Signed)
*  PRELIMINARY RESULTS* Echocardiogram 2D Echocardiogram has been performed.  Ann Potter, Ann Potter 07/17/2016, 10:21 AM

## 2016-07-17 NOTE — Progress Notes (Signed)
      [address]       [name] [id number]             [phone]        CARDIOTHORACIC SURGERY PROGRESS NOTE  R22Days Post-Op Procedure(s) (LRB): MITRAL VALVE (MV) REPLACEMENT (N/A) TRICUSPID VALVE REPAIR (N/A) MAZE (N/A) TRANSESOPHAGEAL ECHOCARDIOGRAM (TEE) (N/A)   R8 Days Post-Op Procedure(s) (LRB): INSERTION PLEURAL DRAINAGE CATHETER (Bilateral) VIDEO BRONCHOSCOPY (N/A)  Subjective: No complaints.  Denies SOB  Objective: Vital signs: BP Readings from Last 1 Encounters:  07/17/16 (!) 95/43   Pulse Readings from Last 1 Encounters:  07/17/16 63   Resp Readings from Last 1 Encounters:  07/17/16 (!) 23   Temp Readings from Last 1 Encounters:  07/17/16 97.5 F (36.4 C) (Oral)    Hemodynamics:    Physical Exam:  Rhythm:   Afib  Breath sounds: Diminished at bases  Heart sounds:  RRR  Incisions:  Healing well  Abdomen:  Soft, non-distended, non-tender  Extremities:  Warm, well-perfused   Intake/Output from previous day: 10/19 0701 - 10/20 0700 In: 2318.6 [P.O.:880; I.V.:1038.6; IV Piggyback:400] Out: 870 [Urine:365; Emesis/NG output:200] Intake/Output this shift: No intake/output data recorded.  Lab Results:  CBC: Recent Labs  07/16/16 0452 07/17/16 0500  WBC 8.6 8.5  HGB 9.4* 8.9*  HCT 30.2* 28.7*  PLT 132* 129*    BMET:  Recent Labs  07/16/16 0452 07/17/16 0500  NA 138 137  K 5.6* 5.3*  CL 100* 99*  CO2 30 28  GLUCOSE 110* 117*  BUN 59* 62*  CREATININE 1.93* 2.01*  CALCIUM 8.7* 8.8*     PT/INR:   Recent Labs  07/17/16 0500  LABPROT 26.0*  INR 2.33    CBG (last 3)   Recent Labs  07/17/16 0007 07/17/16 0402 07/17/16 0730  GLUCAP 126* 117* 122*    ABG    Component Value Date/Time   PHART 7.451 (H) 07/11/2016 1205   PCO2ART 49.1 (H) 07/11/2016 1205   PO2ART 78.2 (L) 07/11/2016 1205   HCO3 33.9 (H) 07/11/2016 1205   TCO2 34 07/02/2016 1749   ACIDBASEDEF 3.0 (H) 06/26/2016 0859   O2SAT 76.8  07/14/2016 0740    CXR: stable  Assessment/Plan:  [id number] original surgery Acute on chronic respiratory failure, stable since extubation  Breathing comfortably w/ O2 sats 94-100% on 3L/min via [place] Maintaining stablerhythm andHR and BP although still on low dosedopamine and now on Neo for BP support Permanent atrial fibrillation now s/p maze procedure - HR stable and well controlled Acute on chronic diastolic CHF with expected post-op volume excess,I/O's positive 1500 mLyesterday, weight >> preop Acute kidney injury with further slight rise in BUN/creatinine today c/w pre-renal azotemia due to intravascular volume depletion Hyperkalemia - stable Expected post op acute blood loss anemia, Hgb 8.9this morning Longstanding rheumatic heart disease  Pulmonary hypertension Morbid obesity Physical deconditioning - severe Protein-depleted malnutrition  Chronic venous insufficiency   Check f/u ECHO which didn't get done yesterday - worrisome for possible RV failure  Add dobutamine  Will ask advanced heart failure team to see in consult   Purcell Nailslarence H Lari Linson, MD 07/17/2016 8:41 AM

## 2016-07-17 NOTE — Progress Notes (Signed)
Speech Language Pathology Treatment: Dysphagia  Patient Details Name: Ann Potter MRN: [id number] DOB: [birth year] Today's Date: 07/17/2016 Time: [id number] SLP Time Calculation (min) (ACUTE ONLY): 27 min  Assessment / Plan / Recommendation Clinical Impression  Pt consumed pureed breakfast along with advanced trials of thin liquids and soft solids. She had no overt signs of aspiration across intake, although she did have moderately prolonged mastication despite placement of dentures. Mild oral residue noted, requiring second swallows and/or liquid washes to clear. Min cues provided for pacing and small bites. Recommend advancement to Dys 2 diet and thin liquids. SLP to f/u for tolerance and possible advancement.   HPI HPI: Patient is a 75 yo female admitted 06/04/2016 due to severe mitral valve stenosis and tricuspid valve regurgitation, now s/p MVR, TVR, and Maze procedure same date, bronchoscopy 10/7 and 10/9, intubation 10/10-10/16 with pleurex placed.   PMH:  morbid obesity, HOH, poor eyesight, mitral stenosis, tricuspid regurg, rheumatic heart disease, anxiety, Afib, CAD, MI, CHF, venous insufficiency, chronic LE edema, HTN, CKD, asthma, chronic back pain      SLP Plan  Continue with current plan of care     Recommendations  Diet recommendations: Dysphagia 2 (fine chop);Thin liquid Liquids provided via: Cup;Straw Medication Administration: Whole meds with puree Supervision: Patient able to self feed;Full supervision/cueing for compensatory strategies Compensations: Minimize environmental distractions;Slow rate;Small sips/bites Postural Changes and/or Swallow Maneuvers: Seated upright 90 degrees;Upright 30-60 min after meal                Oral Care Recommendations: Oral care BID Follow up Recommendations: Skilled Nursing facility Plan: Continue with current plan of care       GO                Maxcine Hamaiewonsky, Erez Mccallum 07/17/2016, 11:05 AM  Maxcine HamLaura Paiewonsky, M.A.  CCC-SLP [phone]

## 2016-07-17 NOTE — Procedures (Signed)
Arterial Catheter Insertion Procedure Note Ann Potter [id number] 02-Aug-1941  Procedure: Insertion of Arterial Catheter  Indications: Blood pressure monitoring  Procedure Details Consent: Risks of procedure as well as the alternatives and risks of each were explained to the (patient/caregiver).  Consent for procedure obtained. Time Out: Verified patient identification, verified procedure, site/side was marked, verified correct patient position, special equipment/implants available, medications/allergies/relevent history reviewed, required imaging and test results available.  Performed  Maximum sterile technique was used including antiseptics, cap, gloves, gown, hand hygiene, mask and sheet. Skin prep: Chlorhexidine; local anesthetic administered 20 gauge catheter was inserted into left radial artery using the Seldinger technique.  Evaluation Blood flow good; BP tracing good. Complications: No apparent complications.   [name], Ann Potter 07/17/2016

## 2016-07-18 ENCOUNTER — Inpatient Hospital Stay (HOSPITAL_COMMUNITY): Payer: Medicare Other

## 2016-07-18 DIAGNOSIS — N179 Acute kidney failure, unspecified: Secondary | ICD-10-CM

## 2016-07-18 LAB — URINE CULTURE: Culture: <10000 — AB

## 2016-07-18 LAB — CBC
HCT: 26.4 % — ABNORMAL LOW (ref 36.0–46.0)
Hemoglobin: 8.3 g/dL — ABNORMAL LOW (ref 12.0–15.0)
MCH: 30.3 pg (ref 26.0–34.0)
MCHC: 31.4 g/dL (ref 30.0–36.0)
MCV: 96.4 fL (ref 78.0–100.0)
Platelets: 126 K/uL — ABNORMAL LOW (ref 150–400)
RBC: 2.74 MIL/uL — ABNORMAL LOW (ref 3.87–5.11)
RDW: 17.2 % — ABNORMAL HIGH (ref 11.5–15.5)
WBC: 7.5 K/uL (ref 4.0–10.5)

## 2016-07-18 LAB — GLUCOSE, CAPILLARY
GLUCOSE-CAPILLARY: 123 mg/dL — AB (ref 65–99)
GLUCOSE-CAPILLARY: 120 mg/dL — AB (ref 65–99)
GLUCOSE-CAPILLARY: 129 mg/dL — AB (ref 65–99)
Glucose-Capillary: 110 mg/dL — ABNORMAL HIGH (ref 65–99)
Glucose-Capillary: 128 mg/dL — ABNORMAL HIGH (ref 65–99)
Glucose-Capillary: 109 mg/dL — ABNORMAL HIGH (ref 65–99)

## 2016-07-18 LAB — BASIC METABOLIC PANEL
Anion gap: 10 (ref 5–15)
BUN: 64 mg/dL — AB (ref 6–20)
CHLORIDE: 98 mmol/L — AB (ref 101–111)
CO2: 26 mmol/L (ref 22–32)
CREATININE: 2.15 mg/dL — AB (ref 0.44–1.00)
Calcium: 9 mg/dL (ref 8.9–10.3)
GFR calc Af Amer: 24 mL/min — ABNORMAL LOW (ref >60)
GFR calc non Af Amer: 21 mL/min — ABNORMAL LOW (ref >60)
GLUCOSE: 116 mg/dL — AB (ref 65–99)
Potassium: 4.8 mmol/L (ref 3.5–5.1)
SODIUM: 134 mmol/L — AB (ref 135–145)

## 2016-07-18 LAB — COOXEMETRY PANEL
Carboxyhemoglobin: 2.2 % — ABNORMAL HIGH (ref 0.5–1.5)
Methemoglobin: 1.1 % (ref 0.0–1.5)
O2 Saturation: 79.9 %
Total hemoglobin: 8.1 g/dL — ABNORMAL LOW (ref 12.0–16.0)

## 2016-07-18 LAB — PROTIME-INR
INR: 3
Prothrombin Time: 31.8 s — ABNORMAL HIGH (ref 11.4–15.2)

## 2016-07-18 MED ORDER — NYSTATIN 100000 UNIT/GM EX OINT
TOPICAL_OINTMENT | Freq: Two times a day (BID) | CUTANEOUS | Status: DC
  Administered 2016-07-18 – 2016-07-20 (×5): via TOPICAL
  Administered 2016-07-20 – 2016-07-22 (×4): 1 via TOPICAL
  Administered 2016-07-22 – 2016-07-24 (×4): via TOPICAL
  Filled 2016-07-18 (×2): qty 15

## 2016-07-18 MED ORDER — METOLAZONE 5 MG PO TABS
5.0000 mg | ORAL_TABLET | Freq: Once | ORAL | Status: AC
  Administered 2016-07-18: 5 mg via ORAL
  Filled 2016-07-18: qty 1

## 2016-07-18 NOTE — Progress Notes (Addendum)
[address]       Ann Potter [id number]             [phone]        CARDIOTHORACIC SURGERY PROGRESS NOTE  R23Days Post-Op Procedure(s) (LRB): MITRAL VALVE (MV) REPLACEMENT (N/A) TRICUSPID VALVE REPAIR (N/A) MAZE (N/A) TRANSESOPHAGEAL ECHOCARDIOGRAM (TEE) (N/A)   R9 Days Post-Op Procedure(s) (LRB): INSERTION PLEURAL DRAINAGE CATHETER (Bilateral) VIDEO BRONCHOSCOPY (N/A)  Subjective: Feels nauseated this morning.  Denies SOB.  Denies abdominal pain.  Objective: Vital signs: BP Readings from Last 1 Encounters:  07/17/16 (!) 105/40   Pulse Readings from Last 1 Encounters:  07/18/16 90   Resp Readings from Last 1 Encounters:  07/18/16 (!) 24   Temp Readings from Last 1 Encounters:  07/18/16 97.9 F (36.6 C) (Oral)    Hemodynamics: CVP:  [9 mmHg-22 mmHg] 18 mmHg  Mixed venous co-ox 79.9%  Physical Exam:  Rhythm:   Afib w/ HR 90  Breath sounds: Diminished at bases  Heart sounds:  RRR w/ more prominent systolic murmur  Incisions:  Clean and dry  Abdomen:  Soft, non-distended, non-tender  Extremities:  Warm, well-perfused, swollen   Intake/Output from previous day: 10/20 0701 - 10/21 0700 In: 1596.6 [I.V.:1396.6; IV Piggyback:200] Out: 975 [Urine:425] Intake/Output this shift: Total I/O In: 177.5 [P.O.:60; I.V.:67.5; IV Piggyback:50] Out: 40 [Urine:40]  Lab Results:  CBC: Recent Labs  07/17/16 0500 07/18/16 0343  WBC 8.5 7.5  HGB 8.9* 8.3*  HCT 28.7* 26.4*  PLT 129* 126*    BMET:  Recent Labs  07/17/16 0500 07/18/16 0343  NA 137 134*  K 5.3* 4.8  CL 99* 98*  CO2 28 26  GLUCOSE 117* 116*  BUN 62* 64*  CREATININE 2.01* 2.15*  CALCIUM 8.8* 9.0     PT/INR:   Recent Labs  07/18/16 0343  LABPROT 31.8*  INR 3.00    CBG (last 3)   Recent Labs  07/17/16 2323 07/18/16 0306 07/18/16 0722  GLUCAP 120* 110* 123*    ABG    Component Value Date/Time   PHART 7.451 (H) 07/11/2016 1205   PCO2ART 49.1 (H)  07/11/2016 1205   PO2ART 78.2 (L) 07/11/2016 1205   HCO3 33.9 (H) 07/11/2016 1205   TCO2 34 07/02/2016 1749   ACIDBASEDEF 3.0 (H) 06/26/2016 0859   O2SAT 79.9 07/18/2016 0335    CXR: PORTABLE CHEST 1 VIEW  COMPARISON:  07/17/2016  FINDINGS: Cardiac shadow is enlarged. Postsurgical changes are again seen. A right-sided PICC line is again noted in satisfactory position. Bilateral chest tubes are seen and stable. No pneumothorax is noted. Bilateral pleural effusions left greater than right are again seen and stable. Increasing density is noted in the lungs particularly on the right. This may represent some progressive pulmonary edema. Persisting consolidation in the left retrocardiac region is noted.  IMPRESSION: Likely increasing pulmonary edema particularly on the right.  The remainder of the exam is stable.   Electronically Signed   By: Alcide Clever M.D.   On: 07/18/2016 07:29   Transthoracic Echocardiography  Patient:    Ann, Potter MR #:       [id number] Study Date: 07/17/2016 Gender:     F Age:        75 Height:     154.9 cm Weight:     116.6 kg BSA:        2.32 m^2 Pt. Status: Room:       2S01C   ADMITTING  Tressie Stalker, M.D.  ATTENDING    Tressie Stalker, M.D.  ORDERING     Tressie Stalker, M.D.  REFERRING    Tressie Stalker, M.D.  SONOGRAPHER  [facility]  PERFORMING   [facility], Inpatient  cc:  ------------------------------------------------------------------- LV EF: 60% -   65%  ------------------------------------------------------------------- History:   PMH:  Rheumatic Fever. Mitral Valve Disorder.  Atrial fibrillation.  Coronary artery disease.  Risk factors: Hypertension.  ------------------------------------------------------------------- Study Conclusions  - Left ventricle: The cavity size was normal. Wall thickness was   increased in a pattern of mild LVH. Indeterminant diastolic   function. Systolic function was  normal. The estimated ejection   fraction was in the range of 60% to 65%. Septal bounce noted.   Wall motion was normal; there were no regional wall motion   abnormalities. - Ventricular septum: MIld D shaped to the interventricular septum,   suggesting RV pressure/volume overload. - Aortic valve: Trileaflet; moderately calcified leaflets. There   was mild stenosis. There was trivial regurgitation. Mean gradient   (S): 14 mm Hg. - Mitral valve: There is a bioprosthetic mitral valve. No   significant regurgitation or stenosis of biorprosthetic mitral   valve. Mean gradient (D): 3 mm Hg. - Left atrium: The atrium was mildly dilated. - Right ventricle: The cavity size was mildly dilated. Systolic   function was mildly reduced. - Tricuspid valve: Status post tricuspid valve repair. There was   trivial regurgitation. Peak RV-RA gradient (S): 31 mm Hg. - Systemic veins: IVC is poorly visualized. - Pericardium, extracardiac: Moderate pericardial effusion with   some organization. Effusion is primarily posteriorly.   Respirometry did not show significant mitral E inflow velocity   variation with respiration.  Impressions:  - Normal LV size with mild LV hypertrophy. EF 60-65% with septal   bounce noted. Mildly dilated RV with mildly decreased systolic   function. D-shaped interventricular septum suggests RV   pressure/volume overload. S/p bioprosthetic mitral valve, appears   to function normally. S/p tricuspid valve repair, no significant   TR. Mild pulmonary hypertension. There is a moderate, partially   organized effusion located primarily posteriorly. Concern for   effusive-constrictive pericarditis with the septal bounce but   mitral valve respirometry is not suggestive of constriction. IVC   not visualized.  ------------------------------------------------------------------- Study data:  Comparison was made to the study of 03/19/2016.  Study status:  Routine.  Procedure:   Transthoracic echocardiography. Image quality was adequate.          Transthoracic echocardiography.  M-mode, complete 2D, spectral Doppler, and color Doppler.  Birthdate:  Patient birthdate: 09/16/1941.  Age:  Patient is 75 yr old.  Sex:  Gender: female.    BMI: 48.6 kg/m^2.  Blood pressure:     85/39  Patient status:  Inpatient.  Study date: Study date: 07/17/2016. Study time: 09:12 AM.  Location:  Bedside.   -------------------------------------------------------------------  ------------------------------------------------------------------- Left ventricle:  The cavity size was normal. Wall thickness was increased in a pattern of mild LVH. Indeterminant diastolic function. Systolic function was normal. The estimated ejection fraction was in the range of 60% to 65%. Septal bounce noted. Wall motion was normal; there were no regional wall motion abnormalities.  ------------------------------------------------------------------- Aortic valve:   Trileaflet; moderately calcified leaflets. Doppler:   There was mild stenosis.   There was trivial regurgitation.    VTI ratio of LVOT to aortic valve: 0.6. Indexed valve area (VTI): 0.74 cm^2/m^2. Peak velocity ratio of LVOT to aortic valve: 0.57. Indexed valve area (Vmax): 0.7  cm^2/m^2. Mean velocity ratio of LVOT to aortic valve: 0.62. Indexed valve area (Vmean): 0.76 cm^2/m^2.    Mean gradient (S): 14 mm Hg. Peak gradient (S): 27 mm Hg.  ------------------------------------------------------------------- Aorta:  Aortic root: The aortic root was normal in size. Ascending aorta: The ascending aorta was normal in size.  ------------------------------------------------------------------- Mitral valve:  There is a bioprosthetic mitral valve. No significant regurgitation or stenosis of biorprosthetic mitral valve.  Doppler:     Indexed valve area by pressure half-time: 1.02 cm^2/m^2. Indexed valve area by continuity equation (using  LVOT flow): 0.87 cm^2/m^2.    Mean gradient (D): 3 mm Hg. Peak gradient (D): 16 mm Hg.  ------------------------------------------------------------------- Left atrium:  The atrium was mildly dilated.  ------------------------------------------------------------------- Right ventricle:  The cavity size was mildly dilated. Systolic function was mildly reduced.  ------------------------------------------------------------------- Ventricular septum:   MIld D shaped to the interventricular septum, suggesting RV pressure/volume overload.  ------------------------------------------------------------------- Pulmonic valve:    Structurally normal valve.   Cusp separation was normal.  Doppler:  Transvalvular velocity was within the normal range. There was trivial regurgitation.  ------------------------------------------------------------------- Tricuspid valve:  Status post tricuspid valve repair.  Doppler: There was trivial regurgitation.  ------------------------------------------------------------------- Pericardium:  Moderate pericardial effusion with some organization. Effusion is primarily posteriorly. Respirometry did not show significant mitral E inflow velocity variation with respiration.   ------------------------------------------------------------------- Systemic veins:  IVC is poorly visualized.  ------------------------------------------------------------------- Measurements   Left ventricle                            Value          Reference  LV ID, ED, PLAX chordal           (L)     33    mm       43 - 52  LV ID, ES, PLAX chordal           (L)     18.5  mm       23 - 38  LV fx shortening, PLAX chordal            44    %        >=29  LV PW thickness, ED                       16.6  mm       ---------  IVS/LV PW ratio, ED                       0.96           <=1.3  Stroke volume, 2D                         88    ml       ---------  Stroke volume/bsa, 2D                      38    ml/m^2   ---------    Ventricular septum                        Value          Reference  IVS thickness, ED                         15.9  mm       ---------    LVOT                                      Value          Reference  LVOT ID, S                                19    mm       ---------  LVOT area                                 2.84  cm^2     ---------  LVOT peak velocity, S                     146   cm/s     ---------  LVOT mean velocity, S                     106   cm/s     ---------  LVOT VTI, S                               31.1  cm       ---------  LVOT peak gradient, S                     9     mm Hg    ---------    Aortic valve                              Value          Reference  Aortic valve peak velocity, S             258   cm/s     ---------  Aortic valve mean velocity, S             171   cm/s     ---------  Aortic valve VTI, S                       51.8  cm       ---------  Aortic mean gradient, S                   14    mm Hg    ---------  Aortic peak gradient, S                   27    mm Hg    ---------  VTI ratio, LVOT/AV                        0.6            ---------  Aortic valve area/bsa, VTI                0.74  cm^2/m^2 ---------  Velocity ratio, peak, LVOT/AV             0.57           ---------  Aortic valve area/bsa, peak  0.7   cm^2/m^2 ---------  velocity  Velocity ratio, mean, LVOT/AV             0.62           ---------  Aortic valve area/bsa, mean               0.76  cm^2/m^2 ---------  velocity  Aortic regurg pressure half-time          357   ms       ---------    Aorta                                     Value          Reference  Aortic root ID, ED                        33    mm       ---------    Left atrium                               Value          Reference  LA ID, A-P, ES                            43    mm       ---------  LA ID/bsa, A-P                            1.86  cm/m^2   <=2.2  LA volume, S                               69.4  ml       ---------  LA volume/bsa, S                          30    ml/m^2   ---------  LA volume, ES, 1-p A4C                    75.9  ml       ---------  LA volume/bsa, ES, 1-p A4C                32.8  ml/m^2   ---------  LA volume, ES, 1-p A2C                    60.7  ml       ---------  LA volume/bsa, ES, 1-p A2C                26.2  ml/m^2   ---------    Mitral valve                              Value          Reference  Mitral E-wave peak velocity               197   cm/s     ---------  Mitral mean velocity, D  72.4  cm/s     ---------  Mitral deceleration time          (H)     254   ms       150 - 230  Mitral pressure half-time                 93    ms       ---------  Mitral mean gradient, D                   3     mm Hg    ---------  Mitral peak gradient, D                   16    mm Hg    ---------  Mitral valve area/bsa, PHT, DP            1.02  cm^2/m^2 ---------  Mitral valve area/bsa, LVOT               0.87  cm^2/m^2 ---------  continuity  Mitral annulus VTI, D                     44    cm       ---------    Tricuspid valve                           Value          Reference  Tricuspid regurg peak velocity            278   cm/s     ---------  Tricuspid peak RV-RA gradient             31    mm Hg    ---------    Systemic veins                            Value          Reference  Estimated CVP                             3     mm Hg    ---------    Right ventricle                           Value          Reference  TAPSE                                     14.1  mm       ---------  RV pressure, S, DP                (H)     34    mm Hg    <=30  Legend: (L)  and  (H)  mark values outside specified reference range.  ------------------------------------------------------------------- Prepared and Electronically Authenticated by  Marca Ancona, M.D. 2017-10-20T13:42:37   Assessment/Plan:  POD30from original  surgery Acute on chronic respiratory failure, stable since extubation  Breathing comfortably w/ O2 sats 94-100% on 4L/min via [place] but CXR with increased opacity c/w CHF Maintaining stablerhythm andHR and BP now on dobutamine and levophed, mixed venous co-ox 80% Pulmonary  hypertension w/ likely right sided heart failure Acute on chronic diastolic CHF with expected post-op volume excess,I/O's positive , weight >> preop Pericardial effusion - I have personally reviewed the patient's ECHO performed yesterday and am underwhelmed by the appearance of this finding - primarily located posteriorly and patient has normal LV function with no signs of compression of either LV or RV, high mixed venous O2 saturation Bilateral pleural effusions s/p bilateral Pleur-X catheter placement - left sided catheter not draining Acute kidney injury with further slight rise in BUN/creatinine today - minimal increase in UOP on lasix drip Permanent atrial fibrillation now s/p maze procedure - HR stable and well controlled Hyperkalemia - stable Expected post op acute blood loss anemia, Hgb down to 8.3this morning Longstanding rheumatic heart disease  Pulmonary hypertension Morbid obesity Physical deconditioning - severe Protein-depleted malnutrition  Chronic venous insufficiency   Will obtain CT scan to f/u left pleural effusion and pericardial effusion - no IV contrast due to acute renal failure  Continue plans per advanced heart failure team  Consider nephrology consult if ARF continues to get worse, but I would be reluctant to consider the patient a candidate for dialysis  Consider repeat limited ECHO if systolic murmur and nausea persists on increased inotropic agents - I am worried about the possibility of creating dynamic LVOT obstruction   Hold coumadin  Purcell Nails, MD 07/18/2016 9:41 AM

## 2016-07-18 NOTE — Progress Notes (Signed)
[facility] BRIEF SICU PROGRESS NOTE  9 Days Post-Op  S/P Procedure(s) (LRB): INSERTION PLEURAL DRAINAGE CATHETER (Bilateral) VIDEO BRONCHOSCOPY (N/A)   Essentially stable day.  Patient still remains nauseated CT scan personally reviewed and notable for very small pericardial effusion and no evidence of pericardial clot, trivial left pleural effusion and small right effusion.  Right PleurX catheter drained 450 mL after CT.  Plan: Continue current plan.  Patient's daughter updated at bedside.  Purcell Nailslarence H Daysia Vandenboom, MD 07/18/2016 6:37 PM

## 2016-07-18 NOTE — Progress Notes (Signed)
Remaining sutures on chest incision d/c at this time per Dr. Cornelius Moraswen request; will cont. To monitor.  Benjamine SpragueYates, Effa Yarrow A

## 2016-07-18 NOTE — Progress Notes (Signed)
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 TEE on 9/28 EF 55-60%. Post-operative course complicated by respiratory failure, AKI, hypotension, and marked volume overload. Extubated but required reintubation 10/10. Later extubated 10/19.  She has had 3 bronchoscopies --> 10/7, 10/9, 10/12. Pleurex catheters placed  10/12 on R and L.   Echo LVEF 55-60%, moderate posterior pericardial effusion. MVR/TV repair ok. RV mildly dilated and mildly reduced systolic function.   RVSP  Lasix drip increased to 15mg /hr yesterday. Inotropes adjusted now on dobutamine 5 norepi 12. SBP 100-115  Continues with sluggish diuresis. Creatinine up slightly. Co-ox 79%. Feels weak. Denies CP. Off BIPAP during the day. Needs at night.   Continues to get albumin q6   Objective:   Weight Range:  Vital Signs:   Temp:  [97.5 F (36.4 C)-98.4 F (36.9 C)] 97.5 F (36.4 C) (10/21 1232) Pulse Rate:  [48-106] 82 (10/21 1430) Resp:  [17-28] 19 (10/21 1430) BP: (89-105)/(27-42) 105/40 (10/20 1645) SpO2:  [89 %-100 %] 97 % (10/21 1439) Arterial Line BP: (93-130)/(33-54) 113/48 (10/21 1430) FiO2 (%):  [40 %] 40 % (10/21 1439) Weight:  [115.7 kg (255 lb 1.2 oz)] 115.7 kg (255 lb 1.2 oz) (10/21 0530) Last BM Date: 07/16/16  Weight change: Filed Weights   07/16/16 0630 07/17/16 0600 07/18/16 0530  Weight: 115.9 kg (255 lb 8.2 oz) 116.6 kg (257 lb 0.9 oz) 115.7 kg (255 lb 1.2 oz)    Intake/Output:   Intake/Output Summary (Last 24 hours) at 07/18/16 1506 Last data filed at 07/18/16 1400  Gross per 24 hour  Intake          1353.12 ml  Output              825 ml  Net           528.12 ml     Physical Exam: CVP 17 General:  Chronically ill appearing obese woman lying  in bed. On Bipap  HEENT: normal Neck: supple. JVP difficult to assess due to size. Carotids 2+ bilat; no bruits. No lymphadenopathy or thryomegaly appreciated. Cor: PMI nondisplaced. Irregular  rate & rhythm. No rubs, gallops or murmurs.Sternal incision approximated.  Lungs: No wheezing. Decreased BS anteriorly Abdomen: obese, soft, nontender, nondistended. No hepatosplenomegaly. No bruits or masses. Good bowel sounds. Extremities: no cyanosis, clubbing, rash, R and LLE 2-3+ edema Neuro: alert & orientedx3, cranial nerves grossly intact. moves all 4 extremities w/o difficulty. Affect pleasant GU: Foley  Telemetry: ?WAP with 1st degree AVB 80s  Labs: Basic Metabolic Panel:  Recent Labs Lab 07/12/16 0400 07/13/16 0409  07/15/16 0521 07/15/16 1545 07/16/16 0452 07/17/16 0500 07/18/16 0343  NA 138 138  < > 135 137 138 137 134*  K 4.1 4.2  < > 6.2* 5.4* 5.6* 5.3* 4.8  CL 95* 96*  < > 95* 103 100* 99* 98*  CO2 33* 33*  < > 29 28 30 28 26   GLUCOSE 145* 155*  < > 101* 165* 110* 117* 116*  BUN 32* 37*  < > 48* 47* 59* 62* 64*  CREATININE 1.16* 1.12*  < > 1.58* 1.53* 1.93* 2.01* 2.15*  CALCIUM 8.0* 7.9*  < > 8.2* 7.4* 8.7* 8.8* 9.0  MG 1.1*  2.2  --   --   --   --   --   --   PHOS 2.4*  --   --   --   --   --   --   --   < > = values in this interval not displayed.  Liver Function Tests:  Recent Labs Lab 07/16/16 0452  AST 26  ALT 18  ALKPHOS 113  BILITOT 1.5*  PROT 5.7*  ALBUMIN 2.6*   No results for input(s): LIPASE, AMYLASE in the last 168 hours. No results for input(s): AMMONIA in the last 168 hours.  CBC:  Recent Labs Lab 07/14/16 0456 07/15/16 0521 07/16/16 0452 07/17/16 0500 07/18/16 0343  WBC 8.6 9.5 8.6 8.5 7.5  HGB 10.2* 10.4* 9.4* 8.9* 8.3*  HCT 32.6* 33.1* 30.2* 28.7* 26.4*  MCV 96.4 96.2 96.2 97.3 96.4  PLT 153 131* 132* 129* 126*    Cardiac Enzymes: No results for input(s): CKTOTAL, CKMB, CKMBINDEX, TROPONINI in the last 168 hours.  BNP: BNP  (last 3 results) No results for input(s): BNP in the last 8760 hours.  ProBNP (last 3 results) No results for input(s): PROBNP in the last 8760 hours.    Other results:  Imaging: Ct Chest Wo Contrast  Result Date: 07/18/2016 CLINICAL DATA:  SOB /pleural effusion  Had CABG 4 weeks ago EXAM: CT CHEST WITHOUT CONTRAST TECHNIQUE: Multidetector CT imaging of the chest was performed following the standard protocol without IV contrast. COMPARISON:  07/18/2016.  CT, 07/08/2016. FINDINGS: Cardiovascular: There is a small pericardial effusion with average Hounsfield units of 45 consistent with hemorrhage. Heart is normal in size. Prosthetic mitral and tricuspid valves are stable. There are moderate coronary artery calcifications. Great vessels are normal in caliber. Mediastinum/Nodes: There are prominent to mildly enlarged mediastinal lymph nodes. Largest node is a right peritracheal, azygos level, node measuring 15 mm in short axis. No mediastinal or hilar masses. Lungs/Pleura: Small right and minimal left pleural effusions. A chest tube extends to the posterior costophrenic sulcus on the right. There is a left-sided chest tube that extends from the upper hemi thorax to have its tip in the posterior mid hemi thorax. There are peribronchovascular ill-defined reticulonodular airspace opacities in the right upper lobe, new since the prior CT. Dependent opacity is noted in both lower lobes consistent with atelectasis. Focal opacity is noted in the left upper lobe adjacent to the left sided chest tube, most likely atelectasis. Upper Abdomen: No acute abnormality. Musculoskeletal: Focal opacity is noted along the left axilla. This measures approximately 2.1 cm in greatest transverse dimension and is likely an area soft tissue hemorrhage. This is new from the prior CT. There are no osteoblastic or osteolytic lesions. IMPRESSION: 1. There are patchy reticular nodular airspace opacities in the right upper lobe new since  prior exam. This is suspicious for infection. 2. Moderate right and small left pleural effusions. Well-positioned chest tubes noted bilaterally. 3. Small amount of pericardial hemorrhage Electronically Signed   By: Amie Portland M.D.   On: 07/18/2016 11:04   Dg Chest Port 1 View  Result Date: 07/18/2016 CLINICAL DATA:  Atelectasis EXAM: PORTABLE CHEST 1 VIEW COMPARISON:  07/17/2016 FINDINGS: Cardiac shadow is enlarged. Postsurgical changes are again seen. A right-sided PICC line is again noted in satisfactory position. Bilateral chest tubes are seen and stable. No pneumothorax is noted. Bilateral pleural effusions left greater than right are again seen and stable. Increasing density is noted in the lungs particularly on the right. This  may represent some progressive pulmonary edema. Persisting consolidation in the left retrocardiac region is noted. IMPRESSION: Likely increasing pulmonary edema particularly on the right. The remainder of the exam is stable. Electronically Signed   By: Alcide CleverMark  Lukens M.D.   On: 07/18/2016 07:29   Dg Chest Port 1 View  Result Date: 07/17/2016 CLINICAL DATA:  Shortness of Breath EXAM: PORTABLE CHEST 1 VIEW COMPARISON:  July 16, 2016. FINDINGS: There is a pleural drainage catheter on each side, unchanged in position. Central catheter tip is in the superior vena cava. No pneumothorax. Small pleural effusions remain. There is interstitial edema with airspace consolidation in the left lower lobe. There is cardiomegaly with pulmonary venous hypertension. No adenopathy evident. There are mitral and tricuspid valve replacements as well as a left atrial appendage clamp. No bone lesions are evident. IMPRESSION: Persistent changes of congestive heart failure. Persistent airspace consolidation left lower lobe. Suspect pneumonia superimposed on congestive heart failure. Stable cardiac silhouette. No appreciable change from 1 day prior. Electronically Signed   By: Bretta BangWilliam  Woodruff III  M.D.   On: 07/17/2016 08:41      Medications:     Scheduled Medications: . albumin human  12.5 g Intravenous Q6H  . alteplase  2 mg Intracatheter Once  . bisacodyl  10 mg Oral Daily   Or  . bisacodyl  10 mg Rectal Daily  . feeding supplement  1 Container Oral TID BM  . feeding supplement (PRO-STAT SUGAR FREE 64)  30 mL Oral BID  . insulin aspart  0-15 Units Subcutaneous Q4H  . ipratropium  0.5 mg Nebulization Q6H  . levalbuterol  1.25 mg Nebulization Q6H  . levothyroxine  50 mcg Oral QAC breakfast  . mouth rinse  15 mL Mouth Rinse Q12H  . nystatin ointment   Topical BID  . sodium chloride flush  10-40 mL Intracatheter Q12H  . sodium chloride flush  3 mL Intravenous Q12H  . Warfarin - Physician Dosing Inpatient   Does not apply q1800     Infusions: . sodium chloride Stopped (07/17/16 2000)  . DOBUTamine 4.974 mcg/kg/min (07/18/16 1400)  . furosemide (LASIX) infusion 15 mg/hr (07/18/16 1400)  . norepinephrine (LEVOPHED) Adult infusion 12.053 mcg/min (07/18/16 1400)  . phenylephrine (NEO-SYNEPHRINE) Adult infusion Stopped (07/17/16 0909)     PRN Medications:  Place/Maintain arterial line **AND** sodium chloride, acetaminophen, Gerhardt's butt cream, ipratropium-albuterol, ondansetron (ZOFRAN) IV, RESOURCE THICKENUP CLEAR, sodium chloride flush, sodium chloride flush   Assessment/Plan:    1. Hypotension:  Echo was reviewed as above, LV EF is normal.  RV function is reduced but not markedly so.  Some concern for effusive-constrictive pericarditis based on septal bounce but mitral inflow respirometry is not consistent with this.   - BP much improved on dobutamine 5 and norepi 12. Can wean norepi as tolerated. Can consider midodrine as needed.  - Blood culture NGTD - May need RHC.  2. 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 CVP 22 -> 17 Creatinine up slightly 2.07-> 2.15. Will add metolazone. Need to follow  creatinine closely. Stop albumin --PA pressures not high on echo but with longstanding MV disease may benefit from switching dobutamine to milrinone if BP can tolerate. Will see how it goes. May need RHC --Pre-op weight 239. She is 255 today 3. AKI: Creatinine up to 2.1, suspect hemodynamically-mediated.  Follow with diuresis. Keep SBP > 100 4. Pericardial effusion: Moderate posterior organized pericardial effusion, concern for effusive-constrictive  pericarditis but MV inflow respirometry not suggestive (though not sure that MV inflow respirometry is as accurate with bioprosthetic MV).   --Effusion small by CT today 5. S/p bioprosthetic MV: Valve appears to function normally on echo.  6. Atrial fibrillation: Persistent, s/p Maze felt to be back in afib.  However tele looks like wandering atrial pacemaker with 1st AVB. Will get ECG. On warfarin. INR 3.0 7. Pleural effusions bilaterally: Pleurx catheters per TCTS 8. Pulmonary: CT today with possible PNA vs patchy edema. No fevers of leukocytosis. Using intermittent BIPAP. Continue diuresis   Length of Stay: 23  Licet Dunphy MD 07/18/2016, 3:06 PM  Advanced Heart Failure Team Pager [phone] (M-F; 7a - 4p)  Please contact [facility] for night-coverage after hours (4p -7a ) and weekends on [email or web address]

## 2016-07-19 ENCOUNTER — Inpatient Hospital Stay (HOSPITAL_COMMUNITY): Payer: Medicare Other

## 2016-07-19 LAB — COMPREHENSIVE METABOLIC PANEL
ALT: 15 U/L (ref 14–54)
ANION GAP: 13 (ref 5–15)
AST: 23 U/L (ref 15–41)
Albumin: 3.3 g/dL — ABNORMAL LOW (ref 3.5–5.0)
Alkaline Phosphatase: 99 U/L (ref 38–126)
BILIRUBIN TOTAL: 2 mg/dL — AB (ref 0.3–1.2)
BUN: 66 mg/dL — ABNORMAL HIGH (ref 6–20)
CHLORIDE: 96 mmol/L — AB (ref 101–111)
CO2: 26 mmol/L (ref 22–32)
Calcium: 9.3 mg/dL (ref 8.9–10.3)
Creatinine, Ser: 2.36 mg/dL — ABNORMAL HIGH (ref 0.44–1.00)
GFR calc Af Amer: 22 mL/min — ABNORMAL LOW (ref >60)
GFR, EST NON AFRICAN AMERICAN: 19 mL/min — AB (ref >60)
Glucose, Bld: 118 mg/dL — ABNORMAL HIGH (ref 65–99)
POTASSIUM: 4.1 mmol/L (ref 3.5–5.1)
Sodium: 135 mmol/L (ref 135–145)
TOTAL PROTEIN: 5.8 g/dL — AB (ref 6.5–8.1)

## 2016-07-19 LAB — PROTIME-INR
INR: 3.51
PROTHROMBIN TIME: 36 s — AB (ref 11.4–15.2)

## 2016-07-19 LAB — COOXEMETRY PANEL
CARBOXYHEMOGLOBIN: 2.2 % — AB (ref 0.5–1.5)
METHEMOGLOBIN: 0.8 % (ref 0.0–1.5)
O2 SAT: 66.4 %
Total hemoglobin: 11.9 g/dL — ABNORMAL LOW (ref 12.0–16.0)

## 2016-07-19 LAB — GLUCOSE, CAPILLARY
GLUCOSE-CAPILLARY: 112 mg/dL — AB (ref 65–99)
GLUCOSE-CAPILLARY: 124 mg/dL — AB (ref 65–99)
GLUCOSE-CAPILLARY: 138 mg/dL — AB (ref 65–99)
GLUCOSE-CAPILLARY: 122 mg/dL — AB (ref 65–99)
GLUCOSE-CAPILLARY: 100 mg/dL — AB (ref 65–99)
Glucose-Capillary: 127 mg/dL — ABNORMAL HIGH (ref 65–99)

## 2016-07-19 LAB — CBC
HEMATOCRIT: 26.8 % — AB (ref 36.0–46.0)
HEMOGLOBIN: 8.5 g/dL — AB (ref 12.0–15.0)
MCH: 30.5 pg (ref 26.0–34.0)
MCHC: 31.7 g/dL (ref 30.0–36.0)
MCV: 96.1 fL (ref 78.0–100.0)
Platelets: 175 10*3/uL (ref 150–400)
RBC: 2.79 MIL/uL — AB (ref 3.87–5.11)
RDW: 17.1 % — ABNORMAL HIGH (ref 11.5–15.5)
WBC: 8 10*3/uL (ref 4.0–10.5)

## 2016-07-19 MED ORDER — VANCOMYCIN HCL 10 G IV SOLR
1250.0000 mg | INTRAVENOUS | Status: DC
  Administered 2016-07-21 – 2016-07-23 (×2): 1250 mg via INTRAVENOUS
  Filled 2016-07-19 (×2): qty 1250

## 2016-07-19 MED ORDER — VANCOMYCIN HCL 10 G IV SOLR
2000.0000 mg | Freq: Once | INTRAVENOUS | Status: AC
  Administered 2016-07-19: 2000 mg via INTRAVENOUS
  Filled 2016-07-19: qty 2000

## 2016-07-19 MED ORDER — DEXTROSE 5 % IV SOLN
2.0000 g | INTRAVENOUS | Status: DC
  Administered 2016-07-19 – 2016-07-24 (×7): 2 g via INTRAVENOUS
  Filled 2016-07-19 (×7): qty 2

## 2016-07-19 NOTE — Progress Notes (Signed)
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 TEE on 9/28 EF 55-60%. Post-operative course complicated by respiratory failure, AKI, hypotension, and marked volume overload. Extubated but required reintubation 10/10. Later extubated 10/19.  She has had 3 bronchoscopies --> 10/7, 10/9, 10/12. Pleurex catheters placed  10/12 on R and L.   Echo LVEF 55-60%, moderate posterior pericardial effusion. MVR/TV repair ok. RV mildly dilated and mildly reduced systolic function.   RVSP  On dobutamine 5/norepi 12 -> 9. SBP 130-150s. Feels better. Sitting up in chair eating.   Remains on Lasix drip at 15mg /hr yesterday. Metolazone added. Urine output picking up. Weight down 1 pound. Creatinine 2.15-> 2.36. Co-ox 64%   Objective:   Weight Range:  Vital Signs:   Temp:  [97.5 F (36.4 C)-98.5 F (36.9 C)] 97.9 F (36.6 C) (10/22 1117) Pulse Rate:  [77-93] 77 (10/22 1200) Resp:  [14-35] 18 (10/22 1200) SpO2:  [80 %-99 %] 98 % (10/22 1200) Arterial Line BP: (100-139)/(38-66) 130/45 (10/22 1200) FiO2 (%):  [40 %] 40 % (10/22 0000) Weight:  [115.5 kg (254 lb 10.1 oz)] 115.5 kg (254 lb 10.1 oz) (10/22 0500) Last BM Date: 07/19/16  Weight change: Filed Weights   07/17/16 0600 07/18/16 0530 07/19/16 0500  Weight: 116.6 kg (257 lb 0.9 oz) 115.7 kg (255 lb 1.2 oz) 115.5 kg (254 lb 10.1 oz)    Intake/Output:   Intake/Output Summary (Last 24 hours) at 07/19/16 1233 Last data filed at 07/19/16 1200  Gross per 24 hour  Intake          1597.04 ml  Output              815 ml  Net           782.04 ml     Physical Exam: CVP 13-14 (down from 17) General:  Chronically ill appearing obese woman sitting in chair HEENT: normal Neck: supple. JVP difficult to  assess due to size. Carotids 2+ bilat; no bruits. No lymphadenopathy or thryomegaly appreciated. Cor: PMI nondisplaced. Irregular  rate & rhythm. No rubs, gallops or murmurs.Sternal incision approximated.  Lungs: No wheezing. Decreased BS anteriorly Abdomen: obese, soft, nontender, nondistended. No hepatosplenomegaly. No bruits or masses. Good bowel sounds. Extremities: no cyanosis, clubbing, rash, R and LLE 2+ edema Neuro: alert & orientedx3, cranial nerves grossly intact. moves all 4 extremities w/o difficulty. Affect pleasant GU: Foley  Telemetry: AF 80s  Labs: Basic Metabolic Panel:  Recent Labs Lab 07/13/16 0409  07/15/16 1545 07/16/16 0452 07/17/16 0500 07/18/16 0343 07/19/16 0430  NA 138  < > 137 138 137 134* 135  K 4.2  < > 5.4* 5.6* 5.3* 4.8 4.1  CL 96*  < > 103 100* 99* 98* 96*  CO2 33*  < > 28 30 28 26 26   GLUCOSE 155*  < > 165* 110* 117* 116* 118*  BUN 37*  < > 47* 59* 62* 64* 66*  CREATININE 1.12*  < > 1.53* 1.93* 2.01* 2.15* 2.36*  CALCIUM 7.9*  < > 7.4* 8.7* 8.8* 9.0 9.3  MG 2.2  --   --   --   --   --   --   < > = values in this  interval not displayed.  Liver Function Tests:  Recent Labs Lab 07/16/16 0452 07/19/16 0430  AST 26 23  ALT 18 15  ALKPHOS 113 99  BILITOT 1.5* 2.0*  PROT 5.7* 5.8*  ALBUMIN 2.6* 3.3*   No results for input(s): LIPASE, AMYLASE in the last 168 hours. No results for input(s): AMMONIA in the last 168 hours.  CBC:  Recent Labs Lab 07/15/16 0521 07/16/16 0452 07/17/16 0500 07/18/16 0343 07/19/16 0430  WBC 9.5 8.6 8.5 7.5 8.0  HGB 10.4* 9.4* 8.9* 8.3* 8.5*  HCT 33.1* 30.2* 28.7* 26.4* 26.8*  MCV 96.2 96.2 97.3 96.4 96.1  PLT 131* 132* 129* 126* 175    Cardiac Enzymes: No results for input(s): CKTOTAL, CKMB, CKMBINDEX, TROPONINI in the last 168 hours.  BNP: BNP (last 3 results) No results for input(s): BNP in the last 8760 hours.  ProBNP (last 3 results) No results for input(s): PROBNP in the last 8760  hours.    Other results:  Imaging: Ct Chest Wo Contrast  Result Date: 07/18/2016 CLINICAL DATA:  SOB /pleural effusion  Had CABG 4 weeks ago EXAM: CT CHEST WITHOUT CONTRAST TECHNIQUE: Multidetector CT imaging of the chest was performed following the standard protocol without IV contrast. COMPARISON:  07/18/2016.  CT, 07/08/2016. FINDINGS: Cardiovascular: There is a small pericardial effusion with average Hounsfield units of 45 consistent with hemorrhage. Heart is normal in size. Prosthetic mitral and tricuspid valves are stable. There are moderate coronary artery calcifications. Great vessels are normal in caliber. Mediastinum/Nodes: There are prominent to mildly enlarged mediastinal lymph nodes. Largest node is a right peritracheal, azygos level, node measuring 15 mm in short axis. No mediastinal or hilar masses. Lungs/Pleura: Small right and minimal left pleural effusions. A chest tube extends to the posterior costophrenic sulcus on the right. There is a left-sided chest tube that extends from the upper hemi thorax to have its tip in the posterior mid hemi thorax. There are peribronchovascular ill-defined reticulonodular airspace opacities in the right upper lobe, new since the prior CT. Dependent opacity is noted in both lower lobes consistent with atelectasis. Focal opacity is noted in the left upper lobe adjacent to the left sided chest tube, most likely atelectasis. Upper Abdomen: No acute abnormality. Musculoskeletal: Focal opacity is noted along the left axilla. This measures approximately 2.1 cm in greatest transverse dimension and is likely an area soft tissue hemorrhage. This is new from the prior CT. There are no osteoblastic or osteolytic lesions. IMPRESSION: 1. There are patchy reticular nodular airspace opacities in the right upper lobe new since prior exam. This is suspicious for infection. 2. Moderate right and small left pleural effusions. Well-positioned chest tubes noted bilaterally. 3.  Small amount of pericardial hemorrhage Electronically Signed   By: Amie Portland M.D.   On: 07/18/2016 11:04   Dg Chest Port 1 View  Result Date: 07/19/2016 CLINICAL DATA:  Atelectasis EXAM: PORTABLE CHEST 1 VIEW COMPARISON:  07/18/2016 FINDINGS: Cardiac shadow is enlarged. Postsurgical changes are again seen. Right-sided PICC line is again noted and stable. Bilateral pleural catheters are stable. Progressive infiltrative changes noted in the right upper lobe. Stable effusions are again noted. IMPRESSION: Increasing right upper lobe infiltrate. Electronically Signed   By: Alcide Clever M.D.   On: 07/19/2016 09:00   Dg Chest Port 1 View  Result Date: 07/18/2016 CLINICAL DATA:  Atelectasis EXAM: PORTABLE CHEST 1 VIEW COMPARISON:  07/17/2016 FINDINGS: Cardiac shadow is enlarged. Postsurgical changes are again seen. A right-sided PICC line is again noted  in satisfactory position. Bilateral chest tubes are seen and stable. No pneumothorax is noted. Bilateral pleural effusions left greater than right are again seen and stable. Increasing density is noted in the lungs particularly on the right. This may represent some progressive pulmonary edema. Persisting consolidation in the left retrocardiac region is noted. IMPRESSION: Likely increasing pulmonary edema particularly on the right. The remainder of the exam is stable. Electronically Signed   By: Mark  Lukens M.D.   On: 07/18/2016 07:29     Medications:     Scheduled Medications: . alteplase  2 mg Intracatheter Once  . bisacodyl  10 mg Oral Daily   Or  . bisacodyl  10 mg Rectal Daily  . cefTAZidime (FORTAZ)  IV  2 g Intravenous Q24H  . feeding supplement  1 Container Oral TID BM  . feeding supplement (PRO-STAT SUGAR FREE 64)  30 mL Oral BID  . insulin aspart  0-15 Units Subcutaneous Q4H  . ipratropium  0.5 mg Nebulization Q6H  . levalbuterol  1.25 mg Nebulization Q6H  . levothyroxine  50 mcg Oral QAC breakfast  . mouth rinse  15 mL Mouth RinsePrudencio MarNov 03, 20GPatent [facility] JacksonvCarolleeLuevenia Maxin8ta Perlan ointment   Topical BID  .Prudencio Mar03-Nov-[place]PatentFrank[place] CH[place] with RV failure:  -- Diuresis picking up on lasix gtt at 15/hr and metolazone. CVP 22 -> 17 -> 13 Creatinine up slightly 2.07-> 2.15 -> 2.3.  -- Continue to follow creatinine. Suspect she had ATN a few days ago in setting of hypotension. Hopefully creatinine will plateau then improve soon. --PA pressures not high on echo but with longstanding MV disease suspect they are higher tan what we are seeing in echo, May benefit from switching dobutamine to milrinone but doing  well on dobutamine now. Will continue. -May need RHC --Pre-op weight 239. She is 254 today 3. AKI: Creatinine up to 2.3 suspect due to ATN Follow with diuresis. Keep SBP > 100 4. 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   --Effusion small by CT yesterday 5. S/p bioprosthetic MV: Valve appears to function normally on echo.  6. Atrial fibrillation: Persistent. Back in AF s/p Maze.  On warfarin. INR 3.5 7. Pleural effusions bilaterally: Pleurx catheters per TCTS 8. Pulmonary: CT yesterday with possible PNA vs patchy edema. Started on abx today by Dr. Cornelius Moraswen. Using intermittent BIPAP. Continue diuresis   Length of Stay: 24  Arvilla MeresBensimhon, Daniel MD 07/19/2016, 12:33 PM  Advanced Heart Failure Team Pager [phone] (M-F; 7a - 4p)  Please contact [facility] for night-coverage after hours (4p -7a ) and weekends on [email or web address]

## 2016-07-19 NOTE — Progress Notes (Signed)
Pharmacy Antibiotic Note  Ann PoeGloria Keinath is a 75 y.o. female admitted on 05/31/2016 for scheduled valve repair. On 10/22, antibiotics are being resumed with concern for pneumonia.  Pharmacy has been consulted for vancomycin and cefepime dosing.  Patient is currently afebrile with a white blood cell count of 8.0k/uL. Chest X-Ray today shows increasing right upper lobe infiltrate. Patient's renal function has continued to worsen over the past several days, with a normCrCL ~27 mL/min. Urine output last reported at 0.311mL/kg/hr. Was previously therapeutic on vancomycin 1250mg  IV every 48 hours, so will reinitiate this dose (do not want to dose higher with worsening renal function).  Plan: Vancomycin 2000mg  IV bolus once, then vancomycin 1250mg  IV every 48 hours. Goal trough 15-20 mcg/mL. Ceftazidime 2g IV every 24 hours Monitor renal function, C/S, and clinical progress Vancomycin trough at steady state due to obesity and changing renal function  Height: 5\' 1"  (154.9 cm) Weight: 254 lb 10.1 oz (115.5 kg) IBW/kg (Calculated) : 47.8  Temp (24hrs), Avg:97.8 F (36.6 C), Min:97.5 F (36.4 C), Max:98.5 F (36.9 C)   Recent Labs Lab 07/15/16 0521 07/15/16 1545 07/16/16 0452 07/17/16 0500 07/18/16 0343 07/19/16 0430  WBC 9.5  --  8.6 8.5 7.5 8.0  CREATININE 1.58* 1.53* 1.93* 2.01* 2.15* 2.36*    Estimated Creatinine Clearance: 24 mL/min (by C-G formula based on SCr of 2.36 mg/dL (H)).    Allergies  Allergen Reactions  . Relafen [Nabumetone] Other (See Comments)    Bladder pain    Antimicrobials this admission: Cefuroxime 9/28>>9/30 Ceftaz 10/6 >>10/17 Resume 10/22 Vanc 9/28 x 2 (OR day). Resume 10/9>>10/17 Resume 10/22 >>  Dose adjustments this admission: 10/13: VT=32 on 1250mg /24h. Hold Vanco 10/14: VR=25 HOLD 10/15: VR=18 resume at 1250mg /48h (UOP dropping)  Microbiology results: 10/20 BCx x2: NG < 24h  10/20 UCx: < 10,000 colonies 10/10 acid fast smear (BAL) -  negative 10/10 BAL specimen B  - no organisms seen 10/10 BAL specimen C - no organisms seen 10/9 bronchial washings - 10K normal flora 10/9 TA - normal flora 10/6 TA - no organisms seen 9/25 MRSA PCR neg  Thank you for allowing pharmacy to be a part of this patient's care.  Carylon PerchesMaggie Cyla Haluska, PharmD Acute Care Pharmacy Resident  Pager: [phone] 07/19/2016

## 2016-07-19 NOTE — Progress Notes (Addendum)
[address]       [name] [address]             [phone]        CARDIOTHORACIC SURGERY PROGRESS NOTE  R24Days Post-Op Procedure(s) (LRB): MITRAL VALVE (MV) REPLACEMENT (N/A) TRICUSPID VALVE REPAIR (N/A) MAZE (N/A) TRANSESOPHAGEAL ECHOCARDIOGRAM (TEE) (N/A)   R10 Days Post-Op Procedure(s) (LRB): INSERTION PLEURAL DRAINAGE CATHETER (Bilateral) VIDEO BRONCHOSCOPY (N/A)  Subjective: Looks and feels a little better this morning.  Reportedly had a good night.  Still hasn't been eating much.  + BM this morning.  Denies SOB  Objective: Vital signs: BP Readings from Last 1 Encounters:  07/17/16 (!) 105/40   Pulse Readings from Last 1 Encounters:  07/19/16 80   Resp Readings from Last 1 Encounters:  07/19/16 17   Temp Readings from Last 1 Encounters:  07/19/16 97.8 F (36.6 C) (Oral)    Hemodynamics: CVP:  [15 mmHg-19 mmHg] 15 mmHg  Mixed venous co-ox 66%  Physical Exam:  Rhythm:   Afib  Breath sounds: Diminished at bases  Heart sounds:  RRR  Incisions:  Clean and dry  Abdomen:  Soft, non-distended, non-tender, + BM this morning  Extremities:  Warm, well-perfused, severely swollen   Intake/Output from previous day: 10/21 0701 - 10/22 0700 In: 1064.6 [P.O.:195; I.V.:819.6; IV Piggyback:50] Out: 1265 [Urine:815] Intake/Output this shift: Total I/O In: 33.1 [I.V.:33.1] Out: 50 [Urine:50]  Lab Results:  CBC: Recent Labs  07/18/16 0343 07/19/16 0430  WBC 7.5 8.0  HGB 8.3* 8.5*  HCT 26.4* 26.8*  PLT 126* 175    BMET:  Recent Labs  07/18/16 0343 07/19/16 0430  NA 134* 135  K 4.8 4.1  CL 98* 96*  CO2 26 26  GLUCOSE 116* 118*  BUN 64* 66*  CREATININE 2.15* 2.36*  CALCIUM 9.0 9.3     PT/INR:   Recent Labs  07/19/16 0430  LABPROT 36.0*  INR 3.51    CBG (last 3)   Recent Labs  07/18/16 2315 07/19/16 0313 07/19/16 0713  GLUCAP 109* 127* 112*    ABG    Component Value Date/Time   PHART 7.451 (H)  07/11/2016 1205   PCO2ART 49.1 (H) 07/11/2016 1205   PO2ART 78.2 (L) 07/11/2016 1205   HCO3 33.9 (H) 07/11/2016 1205   TCO2 34 07/02/2016 1749   ACIDBASEDEF 3.0 (H) 06/26/2016 0859   O2SAT 66.4 07/19/2016 0354    CXR: PORTABLE CHEST 1 VIEW  COMPARISON:  07/18/2016  FINDINGS: Cardiac shadow is enlarged. Postsurgical changes are again seen. Right-sided PICC line is again noted and stable. Bilateral pleural catheters are stable. Progressive infiltrative changes noted in the right upper lobe. Stable effusions are again noted.  IMPRESSION: Increasing right upper lobe infiltrate.   Electronically Signed   By: Alcide Clever M.D.   On: 07/19/2016 09:00   CT CHEST WITHOUT CONTRAST  TECHNIQUE: Multidetector CT imaging of the chest was performed following the standard protocol without IV contrast.  COMPARISON:  07/18/2016.  CT, 07/08/2016.  FINDINGS: Cardiovascular: There is a small pericardial effusion with average Hounsfield units of 45 consistent with hemorrhage. Heart is normal in size. Prosthetic mitral and tricuspid valves are stable. There are moderate coronary artery calcifications. Great vessels are normal in caliber.  Mediastinum/Nodes: There are prominent to mildly enlarged mediastinal lymph nodes. Largest node is a right peritracheal, azygos level, node measuring 15 mm in short axis. No mediastinal or hilar masses.  Lungs/Pleura: Small right and minimal left pleural  effusions. A chest tube extends to the posterior costophrenic sulcus on the right. There is a left-sided chest tube that extends from the upper hemi thorax to have its tip in the posterior mid hemi thorax. There are peribronchovascular ill-defined reticulonodular airspace opacities in the right upper lobe, new since the prior CT. Dependent opacity is noted in both lower lobes consistent with atelectasis. Focal opacity is noted in the left upper lobe adjacent to the left sided chest tube,  most likely atelectasis.  Upper Abdomen: No acute abnormality.  Musculoskeletal: Focal opacity is noted along the left axilla. This measures approximately 2.1 cm in greatest transverse dimension and is likely an area soft tissue hemorrhage. This is new from the prior CT. There are no osteoblastic or osteolytic lesions.  IMPRESSION: 1. There are patchy reticular nodular airspace opacities in the right upper lobe new since prior exam. This is suspicious for infection. 2. Moderate right and small left pleural effusions. Well-positioned chest tubes noted bilaterally. 3. Small amount of pericardial hemorrhage   Electronically Signed   By: Amie Portlandavid  Ormond M.D.   On: 07/18/2016 11:04   Assessment/Plan:  [id number] original surgery Acute on chronic respiratory failure, stable since extubation  Breathing comfortably w/ O2 sats 88-97% on 4L/min via [place] but CXR and CT with increased opacity c/w CHF and possible RUL pneumonia Maintaining stablerhythm andHR and BP currently on dobutamine@5  and levophed@10 , mixed venous co-ox 66% Pulmonary hypertension w/ right sided heart failure Acute on chronic diastolic CHF with expected post-op volume excess,I/O's essentially balancedyesterday, weight >> preop but stable last several days Acute kidney injury with further slightrise in BUN/creatinine today - minimal increase in UOP on lasix drip - may need CRRT  Pericardial effusion - small and insignificant Bilateral pleural effusions s/p bilateral Pleur-X catheter placement - low volume from left and 450 mL from right yesterday Permanent atrial fibrillation now s/p maze procedure - HR stable and well controlled Coumadin on hold due to elevated INR Expected post op acute blood loss anemia, Hgb stable 8.5this morning Longstanding rheumatic heart disease  Pulmonary hypertension Morbid obesity Physical deconditioning - severe Protein-depleted malnutrition  Chronic venous  insufficiency   Culture sputum and start empiric Vanc + Fortaz for HCAP  Continue plans per advanced heart failure team  Consider nephrology consult if ARF continues to get worse, may need CRRT - I would be reluctant to consider the patient a candidate for long term HD but it might be reasonable for short term to facilitate volume removal and acute illness  Encourage po's - discussed possibility of placing feeding tube with patient who claims she will try harder to eat but right heart failure and renal failure likely source of anorexia and nausea  Continue to hold coumadin  Purcell Nailslarence H Zane Pellecchia, MD 07/19/2016 9:41 AM

## 2016-07-19 NOTE — Progress Notes (Signed)
[facility] BRIEF SICU PROGRESS NOTE  10 Days Post-Op  S/P Procedure(s) (LRB): INSERTION PLEURAL DRAINAGE CATHETER (Bilateral) VIDEO BRONCHOSCOPY (N/A)   Stable day  Plan: Continue current plan  Purcell Nailslarence H Chauntel Windsor, MD 07/19/2016 7:03 PM

## 2016-07-20 LAB — BASIC METABOLIC PANEL
ANION GAP: 11 (ref 5–15)
BUN: 74 mg/dL — ABNORMAL HIGH (ref 6–20)
CALCIUM: 9.1 mg/dL (ref 8.9–10.3)
CO2: 27 mmol/L (ref 22–32)
Chloride: 95 mmol/L — ABNORMAL LOW (ref 101–111)
Creatinine, Ser: 2.3 mg/dL — ABNORMAL HIGH (ref 0.44–1.00)
GFR calc Af Amer: 23 mL/min — ABNORMAL LOW (ref >60)
GFR calc non Af Amer: 19 mL/min — ABNORMAL LOW (ref >60)
GLUCOSE: 134 mg/dL — AB (ref 65–99)
Potassium: 3.5 mmol/L (ref 3.5–5.1)
Sodium: 133 mmol/L — ABNORMAL LOW (ref 135–145)

## 2016-07-20 LAB — COMPREHENSIVE METABOLIC PANEL
ALK PHOS: 92 U/L (ref 38–126)
ALT: 16 U/L (ref 14–54)
AST: 23 U/L (ref 15–41)
Albumin: 3.1 g/dL — ABNORMAL LOW (ref 3.5–5.0)
Anion gap: 12 (ref 5–15)
BUN: 71 mg/dL — ABNORMAL HIGH (ref 6–20)
CALCIUM: 9 mg/dL (ref 8.9–10.3)
CO2: 27 mmol/L (ref 22–32)
CREATININE: 2.25 mg/dL — AB (ref 0.44–1.00)
Chloride: 96 mmol/L — ABNORMAL LOW (ref 101–111)
GFR, EST AFRICAN AMERICAN: 23 mL/min — AB (ref >60)
GFR, EST NON AFRICAN AMERICAN: 20 mL/min — AB (ref >60)
Glucose, Bld: 120 mg/dL — ABNORMAL HIGH (ref 65–99)
Potassium: 2.8 mmol/L — ABNORMAL LOW (ref 3.5–5.1)
Sodium: 135 mmol/L (ref 135–145)
Total Bilirubin: 1.8 mg/dL — ABNORMAL HIGH (ref 0.3–1.2)
Total Protein: 5.6 g/dL — ABNORMAL LOW (ref 6.5–8.1)

## 2016-07-20 LAB — GLUCOSE, CAPILLARY
GLUCOSE-CAPILLARY: 109 mg/dL — AB (ref 65–99)
GLUCOSE-CAPILLARY: 109 mg/dL — AB (ref 65–99)
GLUCOSE-CAPILLARY: 140 mg/dL — AB (ref 65–99)
GLUCOSE-CAPILLARY: 114 mg/dL — AB (ref 65–99)
Glucose-Capillary: 119 mg/dL — ABNORMAL HIGH (ref 65–99)
Glucose-Capillary: 114 mg/dL — ABNORMAL HIGH (ref 65–99)

## 2016-07-20 LAB — COOXEMETRY PANEL
CARBOXYHEMOGLOBIN: 2.1 % — AB (ref 0.5–1.5)
Methemoglobin: 1.1 % (ref 0.0–1.5)
O2 SAT: 76.3 %
TOTAL HEMOGLOBIN: 7.6 g/dL — AB (ref 12.0–16.0)

## 2016-07-20 LAB — PROTIME-INR
INR: 3.56
PROTHROMBIN TIME: 36.5 s — AB (ref 11.4–15.2)

## 2016-07-20 LAB — MAGNESIUM: Magnesium: 1.7 mg/dL (ref 1.7–2.4)

## 2016-07-20 MED ORDER — WARFARIN SODIUM 1 MG PO TABS: 1.0000 mg | ORAL_TABLET | Freq: Every day | ORAL | Status: DC

## 2016-07-20 MED ORDER — LEVALBUTEROL HCL 1.25 MG/0.5ML IN NEBU
1.2500 mg | INHALATION_SOLUTION | Freq: Three times a day (TID) | RESPIRATORY_TRACT | Status: DC
  Administered 2016-07-21 – 2016-07-24 (×11): 1.25 mg via RESPIRATORY_TRACT
  Filled 2016-07-20 (×13): qty 0.5

## 2016-07-20 MED ORDER — POTASSIUM CHLORIDE CRYS ER 20 MEQ PO TBCR
40.0000 meq | EXTENDED_RELEASE_TABLET | Freq: Two times a day (BID) | ORAL | Status: AC
  Administered 2016-07-20 (×2): 40 meq via ORAL
  Filled 2016-07-20 (×2): qty 2

## 2016-07-20 MED ORDER — POTASSIUM CHLORIDE 10 MEQ/50ML IV SOLN: 10.0000 meq | INTRAVENOUS | Status: DC

## 2016-07-20 MED ORDER — IPRATROPIUM BROMIDE 0.02 % IN SOLN
0.5000 mg | Freq: Three times a day (TID) | RESPIRATORY_TRACT | Status: DC
  Administered 2016-07-21 – 2016-07-24 (×11): 0.5 mg via RESPIRATORY_TRACT
  Filled 2016-07-20 (×13): qty 2.5

## 2016-07-20 MED ORDER — METOLAZONE 5 MG PO TABS
5.0000 mg | ORAL_TABLET | Freq: Once | ORAL | Status: AC
  Administered 2016-07-20: 5 mg via ORAL
  Filled 2016-07-20: qty 1

## 2016-07-20 MED ORDER — POTASSIUM CHLORIDE 10 MEQ/50ML IV SOLN
10.0000 meq | INTRAVENOUS | Status: AC
  Administered 2016-07-20 (×4): 10 meq via INTRAVENOUS
  Filled 2016-07-20 (×4): qty 50

## 2016-07-20 MED ORDER — MAGNESIUM SULFATE 4 GM/100ML IV SOLN
4.0000 g | Freq: Once | INTRAVENOUS | Status: AC
  Administered 2016-07-20: 4 g via INTRAVENOUS
  Filled 2016-07-20: qty 100

## 2016-07-20 NOTE — Progress Notes (Signed)
Nutrition Follow Up  DOCUMENTATION CODES:   Morbid obesity  INTERVENTION:    Continue Boost Breeze po TID, each supplement provides 250 kcal and 9 grams of protein   Continue Prostat liquid protein po 30 ml BID with meals, each supplement provides 100 kcal, 15 grams protein  NUTRITION DIAGNOSIS:   Increased nutrient needs related to acute illness (post-op healing) as evidenced by estimated needs, ongoing  GOAL:   Patient will meet greater than or equal to 90% of their needs, progressing   MONITOR:   PO intake, Supplement acceptance, Labs, Weight trends, Skin, I & O's,  ASSESSMENT:   75 yo Female admitted 06/17/2016 due to 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  Patient s/p procedures 10/10: VIDEO BRONCHOSCOPY INTUBATION REMOVAL OF SECRETIONS   S/p swallow evaluation 10/18 >> advanced to Dys 2-thin liquids. PO intake very poor at 10% per flowsheet records. Has breakfast meal on tray table >> barely touched. She is taking some of her oral nutrition supplements. Noted Dr. Cornelius Moraswen discussed possibility of feeding tube.   Diet Order:  DIET DYS 2 Room service appropriate? Yes; Fluid consistency: Thin  Skin:  Reviewed, no issues   CBG (last 3)   Recent Labs  07/19/16 2315 07/20/16 0305 07/20/16 0852  GLUCAP 100* 109* 109*   Last BM:  10/22  Height:   Ht Readings from Last 1 Encounters:  07/03/16 5\' 1"  (1.549 m)    Weight:   Wt Readings from Last 1 Encounters:  07/20/16 253 lb 4.9 oz (114.9 kg)    Ideal Body Weight:  45.4 kg  BMI:  Body mass index is 47.86 kg/m.  Re-estimated Nutritional Needs:   Kcal:  1600-1800  Protein:  80-90 gm  Fluid:  1.6-1.8 L  EDUCATION NEEDS:   No education needs identified at this time  Maureen ChattersKatie Aqil Goetting, RD, LDN Pager #: [phone] After-Hours Pager #: [phone]

## 2016-07-20 NOTE — Progress Notes (Signed)
Physical Therapy Treatment Patient Details Name: Ann Potter MRN: [id number] DOB: December 09, 1940 Today's Date: 07/20/2016    History of Present Illness Patient is a 75 yo female admitted 06/04/2016 due to severe mitral valve stenosis and tricuspid valve regurgitation, now s/p MVR, TVR, and Maze procedure same date, bronchoscopy 10/7 and 10/9, intubation 10/10-10/16 with pleurex placed.   PMH:  morbid obesity, HOH, poor eyesight, mitral stenosis, tricuspid regurg, rheumatic heart disease, anxiety, Afib, CAD, MI, CHF, venous insufficiency, chronic LE edema, HTN, CKD, asthma, chronic back pain    PT Comments    Pt progressing with ability to stand and maintain standing but remains unable to take steps since her period of reintubation. Pt educated for HEP and encouraged to perform throughout the day even when therapy not present. Pt with K of 2.8 and receiving runs with Dr.Owen present and verbally requesting and consenting to therapy at this time. Pt educated for progression and transfers and she remains positive and joking throughout.   BP 139/47 pre, post 132/51 HR 75-85 sats 95-100% on 5L   Follow Up Recommendations  SNF;Supervision/Assistance - 24 hour     Equipment Recommendations       Recommendations for Other Services       Precautions / Restrictions Precautions Precautions: Sternal;Fall Precaution Comments:  Reviewed sternal precautions pt able to state 4/5    Mobility  Bed Mobility               General bed mobility comments: pt in chair on arrival  Transfers Overall transfer level: Needs assistance   Transfers: Sit to/from Stand Sit to Stand: Mod assist;+2 safety/equipment;+2 physical assistance         General transfer comment: pt stood x 1 from recliner was able to maintain for 45 sec when attempted to step pt stated she felt too sick and sat quickly without warning. Pt denied attempting to stand additional time. +4 assist to lift and scoot pt back in  chair after sitting  Ambulation/Gait             General Gait Details: unable at this time   Stairs            Wheelchair Mobility    Modified Rankin (Stroke Patients Only)       Balance                                    Cognition Arousal/Alertness: Awake/alert Behavior During Therapy: WFL for tasks assessed/performed Overall Cognitive Status: Within Functional Limits for tasks assessed       Memory: Decreased recall of precautions              Exercises General Exercises - Lower Extremity Ankle Circles/Pumps: AROM;Both;10 reps;Seated Short Arc Quad: AROM;Both;10 reps;Seated Hip ABduction/ADduction: AAROM;Both;10 reps;Seated Hip Flexion/Marching: AAROM;Both;15 reps;Seated    General Comments        Pertinent Vitals/Pain Pain Score: 4  Pain Location: abdominal pain- "feel sick" Pain Descriptors / Indicators: Squeezing Pain Intervention(s): Limited activity within patient's tolerance    Home Living                      Prior Function            PT Goals (current goals can now be found in the care plan section) Progress towards PT goals: Progressing toward goals (slowly)    Frequency  PT Plan Current plan remains appropriate    Co-evaluation             End of Session Equipment Utilized During Treatment: Gait belt;Oxygen Activity Tolerance: Patient limited by fatigue Patient left: in chair;with call bell/phone within reach;with nursing/sitter in room     Time: 0901-0932 PT Time Calculation (min) (ACUTE ONLY): 31 min  Charges:  $Therapeutic Exercise: 8-22 mins $Therapeutic Activity: 8-22 mins                    G Codes:      Delorse Lekabor, Christain Mcraney Beth 07/20/2016, 10:19 AM Delaney MeigsMaija Tabor Cythia Bachtel, PT [phone]

## 2016-07-20 NOTE — Evaluation (Signed)
Occupational Therapy Evaluation Patient Details Name: Ann Potter MRN: [id number] DOB: Jun 19, 1941 Today's Date: 07/20/2016    History of Present Illness Patient is a 75 yo female admitted 05/29/2016 due to severe mitral valve stenosis and tricuspid valve regurgitation, now s/p MVR, TVR, and Maze procedure same date, bronchoscopy 10/7 and 10/9, intubation 10/10-10/16 with pleurex placed.   PMH:  morbid obesity, HOH, poor eyesight, mitral stenosis, tricuspid regurg, rheumatic heart disease, anxiety, Afib, CAD, MI, CHF, venous insufficiency, chronic LE edema, HTN, CKD, asthma, chronic back pain   Clinical Impression   Pt admitted with above. She demonstrates the below listed deficits and will benefit from continued OT to maximize safety and independence with BADLs.  Pt presents to OT with generalized weakness, decreased activity tolerance, impaired balance, decreased bil. UE strength, pain.  She requires max - total A for ADLs and fatigues quickly.  Will follow acutely.  Recommend SNF level rehab at discharge.       Follow Up Recommendations  SNF    Equipment Recommendations  None recommended by OT    Recommendations for Other Services       Precautions / Restrictions Precautions Precautions: Sternal;Fall Precaution Comments:  Reviewed sternal precautions pt able to state 4/5 Restrictions Weight Bearing Restrictions: Yes      Mobility Bed Mobility Overal bed mobility: Needs Assistance Bed Mobility: Rolling Rolling: Max assist            Transfers                 General transfer comment: Pt too fatigued to attempt     Balance                                            ADL Overall ADL's : Needs assistance/impaired Eating/Feeding: Set up;Bed level   Grooming: Wash/dry hands;Wash/dry face;Oral care;Brushing hair;Set up;Bed level   Upper Body Bathing: Maximal assistance;Bed level   Lower Body Bathing: Total assistance;Bed level    Upper Body Dressing : Total assistance;Bed level   Lower Body Dressing: Total assistance;Bed level   Toilet Transfer: Total assistance Toilet Transfer Details (indicate cue type and reason): did not attempt due to fatigue  Toileting- Clothing Manipulation and Hygiene: Total assistance;Bed level       Functional mobility during ADLs: Maximal assistance;+2 for safety/equipment General ADL Comments: Pt limited by fatigue      Vision     Perception     Praxis      Pertinent Vitals/Pain Pain Assessment: Faces Faces Pain Scale: Hurts a little bit Pain Location: chest  Pain Descriptors / Indicators: Grimacing Pain Intervention(s): Monitored during session     Hand Dominance Right   Extremity/Trunk Assessment Upper Extremity Assessment Upper Extremity Assessment: Generalized weakness   Lower Extremity Assessment Lower Extremity Assessment: Defer to PT evaluation       Communication Communication Communication: HOH   Cognition Arousal/Alertness: Awake/alert;Lethargic Behavior During Therapy:  (irritable ) Overall Cognitive Status: Within Functional Limits for tasks assessed                     General Comments       Exercises Exercises: General Upper Extremity;General Lower Extremity     Shoulder Instructions      Home Living Family/patient expects to be discharged to:: Skilled nursing facility  Prior Functioning/Environment Level of Independence: Independent with assistive device(s);Needs assistance  Gait / Transfers Assistance Needed: Ambulates with RW ADL's / Homemaking Assistance Needed: Assist for housekeeping   Comments: does not drive         OT Problem List: Decreased strength;Decreased activity tolerance;Impaired balance (sitting and/or standing);Decreased safety awareness;Decreased knowledge of use of DME or AE;Decreased knowledge of precautions;Cardiopulmonary status limiting  activity;Obesity;Pain   OT Treatment/Interventions: Self-care/ADL training;Therapeutic exercise;Energy conservation;DME and/or AE instruction;Therapeutic activities;Patient/family education;Balance training    OT Goals(Current goals can be found in the care plan section) Acute Rehab OT Goals Patient Stated Goal: to sleep  OT Goal Formulation: With patient Time For Goal Achievement: 08/03/16 Potential to Achieve Goals: Good ADL Goals Pt Will Perform Grooming: with set-up;sitting Pt Will Perform Upper Body Bathing: with supervision;sitting Pt Will Perform Lower Body Bathing: with mod assist;sit to/from stand Pt Will Transfer to Toilet: with mod assist;stand pivot transfer;bedside commode Pt Will Perform Toileting - Clothing Manipulation and hygiene: with mod assist;sit to/from stand Pt/caregiver will Perform Home Exercise Program: Increased strength;Right Upper extremity;Left upper extremity;With Supervision;With written HEP provided  OT Frequency: Min 2X/week   Barriers to D/C: Decreased caregiver support          Co-evaluation              End of Session Equipment Utilized During Treatment: Oxygen Nurse Communication: Mobility status  Activity Tolerance: Patient limited by fatigue;Patient limited by lethargy Patient left: in bed;with call bell/phone within reach   Time: [id number] OT Time Calculation (min): 14 min Charges:  OT General Charges $OT Visit: 1 Procedure OT Evaluation $OT Eval Moderate Complexity: 1 Procedure G-Codes:    Jeani HawkingConarpe, Akosua Constantine M 07/20/2016, 3:36 PM

## 2016-07-20 NOTE — Progress Notes (Signed)
      [address]       [name] [id number]             [phone]      Stable day, no new issues  Still slow to mobilize  BP (!) 102/53   Pulse 78   Temp 97.4 F (36.3 C) (Oral)   Resp 16   Ht 5\' 1"  (1.549 m)   Wt 253 lb 4.9 oz (114.9 kg)   SpO2 95%   BMI 47.86 kg/m    Intake/Output Summary (Last 24 hours) at 07/20/16 1858 Last data filed at 07/20/16 1700  Gross per 24 hour  Intake          1302.15 ml  Output              905 ml  Net           397.15 ml    Viviann SpareSteven C. Dorris FetchHendrickson, MD [facility] [phone]

## 2016-07-20 NOTE — Progress Notes (Addendum)
Patient ID: Ann Potter, female   DOB: 09-17-1941, 75 y.o.   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 TEE on 9/28 EF 55-60%. Post-operative course complicated by respiratory failure, AKI, hypotension, and marked volume overload. Extubated but required reintubation 10/10. Later extubated 10/19.  She has had 3 bronchoscopies --> 10/7, 10/9, 10/12. Pleurex catheters placed  10/12 on R and L.   Echo LVEF 55-60%, moderate posterior pericardial effusion. MVR/TV repair ok. RV mildly dilated and mildly reduced systolic function.   RVSP  CT chest: Possible RUL PNA, small hemorrhagic pericardial effusion.   On dobutamine 5, now off norepinephrine. SBP 130s-150s. Feels better. Sitting up in chair eating.   Remains on Lasix drip at 15mg /hr yesterday. Got metolazone Saturday but not Sunday.  UOP slower yesterday without metolazone.  Creatinine 2.36 => 2.25.   Objective:   Weight Range:  Vital Signs:   Temp:  [97 F (36.1 C)-98.4 F (36.9 C)] 97.9 F (36.6 C) (10/23 0430) Pulse Rate:  [68-94] 76 (10/23 0700) Resp:  [13-29] 14 (10/23 0700) SpO2:  [80 %-100 %] 100 % (10/23 0742) Arterial Line BP: (89-156)/(37-60) 143/39 (10/23 0700) FiO2 (%):  [40 %] 40 % (10/23 0400) Weight:  [253 lb 4.9 oz (114.9 kg)] 253 lb 4.9 oz (114.9 kg) (10/23 0451) Last BM Date: 07/19/16  Weight change: Filed Weights   07/18/16 0530 07/19/16 0500 07/20/16 0451  Weight: 255 lb 1.2 oz (115.7 kg) 254 lb 10.1 oz (115.5 kg) 253 lb 4.9 oz (114.9 kg)    Intake/Output:   Intake/Output Summary (Last 24 hours) at 07/20/16 0845 Last data filed at 07/20/16 0700  Gross per 24 hour  Intake          1624.25 ml  Output             1310 ml  Net           31 4.25  ml     Physical Exam: CVP 19 General:  Chronically ill appearing obese woman sitting in chair HEENT: normal Neck: Thick. JVP difficult to assess due to size. Carotids 2+ bilat; no bruits. No lymphadenopathy or thryomegaly appreciated. Cor: PMI nondisplaced. Irregular rate & rhythm. No rubs, gallops or murmurs. Sternal incision approximated.  Lungs: No wheezing. Decreased BS anteriorly Abdomen: obese, soft, nontender, nondistended. No hepatosplenomegaly. No bruits or masses. Good bowel sounds. Extremities: no cyanosis, clubbing, rash.  1+ edema to knees bilaterally.  Neuro: alert & orientedx3, cranial nerves grossly intact. moves all 4 extremities w/o difficulty. Affect pleasant GU: Foley  Telemetry: AF 70s  Labs: Basic Metabolic Panel:  Recent Labs Lab 07/16/16 0452 07/17/16 0500 07/18/16 0343 07/19/16 0430 07/20/16 0540  NA 138 137 134* 135 135  K 5.6* 5.3* 4.8 4.1 2.8*  CL 100* 99* 98* 96* 96*  CO2 30 28 26 26 27   GLUCOSE 110* 117* 116* 118* 120*  BUN 59* 62* 64* 66* 71*  CREATININE 1.93* 2.01* 2.15* 2.36* 2.25*  CALCIUM 8.7* 8.8* 9.0 9.3 9.0    Liver Function Tests:  Recent Labs Lab 07/16/16 0452 07/19/16 0430 07/20/16 0540  AST 26 23 23   ALT 18 15 16   ALKPHOS 113 99 92  BILITOT 1.5* 2.0* 1.8*  PROT 5.7* 5.8* 5.6*  ALBUMIN 2.6* 3.3* 3.1*   No results for input(s): LIPASE, AMYLASE in the last 168 hours. No results for input(s): AMMONIA in the last 168 hours.  CBC:  Recent Labs Lab 07/15/16 0521 07/16/16 0452 07/17/16 0500 07/18/16 0343 07/19/16 0430  WBC 9.5 8.6 8.5 7.5 8.0  HGB 10.4* 9.4* 8.9* 8.3* 8.5*  HCT 33.1* 30.2* 28.7* 26.4* 26.8*  MCV 96.2 96.2 97.3 96.4 96.1  PLT 131* 132* 129* 126* 175    Cardiac Enzymes: No results for input(s): CKTOTAL, CKMB, CKMBINDEX, TROPONINI in the last 168 hours.  BNP: BNP (last 3 results) No results for input(s): BNP in the last 8760 hours.  ProBNP (last 3 results) No results for input(s): PROBNP in  the last 8760 hours.    Other results:  Imaging: Ct Chest Wo Contrast  Result Date: 07/18/2016 CLINICAL DATA:  SOB /pleural effusion  Had CABG 4 weeks ago EXAM: CT CHEST WITHOUT CONTRAST TECHNIQUE: Multidetector CT imaging of the chest was performed following the standard protocol without IV contrast. COMPARISON:  07/18/2016.  CT, 07/08/2016. FINDINGS: Cardiovascular: There is a small pericardial effusion with average Hounsfield units of 45 consistent with hemorrhage. Heart is normal in size. Prosthetic mitral and tricuspid valves are stable. There are moderate coronary artery calcifications. Great vessels are normal in caliber. Mediastinum/Nodes: There are prominent to mildly enlarged mediastinal lymph nodes. Largest node is a right peritracheal, azygos level, node measuring 15 mm in short axis. No mediastinal or hilar masses. Lungs/Pleura: Small right and minimal left pleural effusions. A chest tube extends to the posterior costophrenic sulcus on the right. There is a left-sided chest tube that extends from the upper hemi thorax to have its tip in the posterior mid hemi thorax. There are peribronchovascular ill-defined reticulonodular airspace opacities in the right upper lobe, new since the prior CT. Dependent opacity is noted in both lower lobes consistent with atelectasis. Focal opacity is noted in the left upper lobe adjacent to the left sided chest tube, most likely atelectasis. Upper Abdomen: No acute abnormality. Musculoskeletal: Focal opacity is noted along the left axilla. This measures approximately 2.1 cm in greatest transverse dimension and is likely an area soft tissue hemorrhage. This is new from the prior CT. There are no osteoblastic or osteolytic lesions. IMPRESSION: 1. There are patchy reticular nodular airspace opacities in the right upper lobe new since prior exam. This is suspicious for infection. 2. Moderate right and small left pleural effusions. Well-positioned chest tubes noted  bilaterally. 3. Small amount of pericardial hemorrhage Electronically Signed   By: Amie Portlandavid  Ormond M.D.   On: 07/18/2016 11:04   Dg Chest Port 1 View  Result Date: 07/19/2016 CLINICAL DATA:  Atelectasis EXAM: PORTABLE CHEST 1 VIEW COMPARISON:  07/18/2016 FINDINGS: Cardiac shadow is enlarged. Postsurgical changes are again seen. Right-sided PICC line is again noted and stable. Bilateral pleural catheters are stable. Progressive infiltrative changes noted in the right upper lobe. Stable effusions are again noted. IMPRESSION: Increasing right upper lobe infiltrate. Electronically Signed   By: Alcide CleverMark  Lukens M.D.   On: 07/19/2016 09:00     Medications:     Scheduled Medications: . alteplase  2 mg Intracatheter Once  . bisacodyl  10 mg Oral Daily   Or  . bisacodyl  10 mg Rectal Daily  . cefTAZidime (FORTAZ)  IV  2 g Intravenous Q24H  . feeding supplement  1 Container Oral TID BM  . feeding supplement (PRO-STAT SUGAR FREE 64)  30 mL Oral  BID  . insulin aspart  0-15 Units Subcutaneous Q4H  . ipratropium  0.5 mg Nebulization Q6H  . levalbuterol  1.25 mg Nebulization Q6H  . levothyroxine  50 mcg Oral QAC breakfast  . mouth rinse  15 mL Mouth Rinse Q12H  . metolazone  5 mg Oral Once  . nystatin ointment   Topical BID  . potassium chloride  10 mEq Intravenous Q1 Hr x 4  . potassium chloride  40 mEq Oral BID  . sodium chloride flush  10-40 mL Intracatheter Q12H  . sodium chloride flush  3 mL Intravenous Q12H  . [START ON 07/21/2016] vancomycin  1,250 mg Intravenous Q48H  . warfarin  1 mg Oral q1800  . Warfarin - Physician Dosing Inpatient   Does not apply q1800    Infusions: . sodium chloride Stopped (07/17/16 2000)  . DOBUTamine 5 mcg/kg/min (07/20/16 0700)  . furosemide (LASIX) infusion 15 mg/hr (07/20/16 0807)  . norepinephrine (LEVOPHED) Adult infusion Stopped (07/19/16 2015)    PRN Medications: Place/Maintain arterial line **AND** sodium chloride, acetaminophen, Gerhardt's butt  cream, ipratropium-albuterol, ondansetron (ZOFRAN) IV, RESOURCE THICKENUP CLEAR, sodium chloride flush, sodium chloride flush   Assessment/Plan:    1. Hypotension:  Echo showed LV EF normal.  RV function is reduced but not markedly so.  Some concern for effusive-constrictive pericarditis based on septal bounce but mitral inflow respirometry is not consistent with this and effusion is relatively small.  I suspect hypotension has led to ATN on top of baseline PAH.  - BP improved, now off norepinephrine.   - Blood cultures NGTD 2. 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  Co-ox 76%.  UOP lower yesterday but did not get metolazone. - Continue Lasix gtt and add metolazone 5 mg x 1 today. - Will need RHC, PASP on echo only but suspect PA pressure is probably higher with longstanding MV disease.  May benefit from switching dobutamine to milrinone but doing well on dobutamine now. Will continue.  INR 3.56 today, will plan RHC when it has trended down some, reassess tomorrow.  3. AKI: Suspect ATN in setting of hypotension.  Creatinine marginally lower today, hopefully continues to trend down.  4. Pericardial effusion: Moderate posterior organized pericardial effusion, concern for effusive-constrictive pericarditis but MV inflow respirometry not suggestive (though not sure that MV inflow respirometry is as accurate with bioprosthetic MV).  Effusion small by chest CT.  5. S/p bioprosthetic MV: Valve appears to function normally on echo.  6. Atrial fibrillation: Persistent. Back in AF s/p Maze.  On warfarin. INR 3.56.  Will need this to trend down some prior to RHC.  - Hold warfarin for now, would like to get INR close to 2.0 range for RHC. - Consider DCCV once diuresed and off dobutamine.  7. Pleural effusions bilaterally: Pleurx catheters per TCTS 8. ID: Possible right upper lobe PNA by CT chest.  Empiric vanco/ceftazidime.   Length of Stay: 25  Marca Ancona MD 07/20/2016, 8:45 AM  Advanced Heart Failure Team Pager [phone] (M-F; 7a - 4p)  Please contact [facility] for night-coverage after hours (4p -7a ) and weekends on [email or web address]

## 2016-07-20 NOTE — Progress Notes (Signed)
Patient to discharge to [facility] when medically cleared and stable for discharge. CSW continues to follow for SNF placement.          Lance MussAshley Gardner,MSW, LCSW [facility] Clinical Social Worker [phone]

## 2016-07-20 NOTE — Care Management Important Message (Signed)
Important Message  Patient Details  Name: Ann Potter MRN: [id number] Date of Birth: 04-Dec-1940   Medicare Important Message Given:  Yes    Luma Clopper Stefan ChurchBratton 07/20/2016, 12:37 PM

## 2016-07-20 NOTE — Progress Notes (Addendum)
[address]       [name] [address]             [phone]        CARDIOTHORACIC SURGERY PROGRESS NOTE  R25Days Post-Op Procedure(s) (LRB): MITRAL VALVE (MV) REPLACEMENT (N/A) TRICUSPID VALVE REPAIR (N/A) MAZE (N/A) TRANSESOPHAGEAL ECHOCARDIOGRAM (TEE) (N/A)   R11 Days Post-Op Procedure(s) (LRB): INSERTION PLEURAL DRAINAGE CATHETER (Bilateral) VIDEO BRONCHOSCOPY (N/A)  Subjective: Had a good night.  Looks good.  Oral intake remains marginal and inconsistent.  Objective: Vital signs: BP Readings from Last 1 Encounters:  07/17/16 (!) 105/40   Pulse Readings from Last 1 Encounters:  07/20/16 76   Resp Readings from Last 1 Encounters:  07/20/16 14   Temp Readings from Last 1 Encounters:  07/20/16 97.9 F (36.6 C) (Oral)    Hemodynamics: CVP:  [11 mmHg-19 mmHg] 19 mmHg  Mixed venous co-ox 76%%   Physical Exam:  Rhythm:   Afib w/ controlled rate  Breath sounds: Diminished at bases  Heart sounds:  RRR  Incisions:  Clean and dry  Abdomen:  Soft, non-distended, non-tender  Extremities:  Warm, well-perfused    Intake/Output from previous day: 10/22 0701 - 10/23 0700 In: 1657.4 [P.O.:450; I.V.:657.4; IV Piggyback:550] Out: 1360 [Urine:1360] Intake/Output this shift: No intake/output data recorded.  Lab Results:  CBC: Recent Labs  07/18/16 0343 07/19/16 0430  WBC 7.5 8.0  HGB 8.3* 8.5*  HCT 26.4* 26.8*  PLT 126* 175    BMET:  Recent Labs  07/19/16 0430 07/20/16 0540  NA 135 135  K 4.1 2.8*  CL 96* 96*  CO2 26 27  GLUCOSE 118* 120*  BUN 66* 71*  CREATININE 2.36* 2.25*  CALCIUM 9.3 9.0     PT/INR:   Recent Labs  07/20/16 0405  LABPROT 36.5*  INR 3.56    CBG (last 3)   Recent Labs  07/19/16 1912 07/19/16 2315 07/20/16 0305  GLUCAP 122* 100* 109*    ABG    Component Value Date/Time   PHART 7.451 (H) 07/11/2016 1205   PCO2ART 49.1 (H) 07/11/2016 1205   PO2ART 78.2 (L) 07/11/2016 1205   HCO3 33.9  (H) 07/11/2016 1205   TCO2 34 07/02/2016 1749   ACIDBASEDEF 3.0 (H) 06/26/2016 0859   O2SAT 76.3 07/20/2016 0411    CXR: n/a  Assessment/Plan:  Clinically stable Acute on chronic respiratory failure, stable Breathing comfortably w/ O2 sats 96-100% on 5L/min via [place] Maintaining stablerhythm andHR and improved BP currently on dobutamine@5  and off levophed, mixed venous co-ox 76% Pulmonary hypertension w/ right sided heart failure Acute on chronic diastolic CHF with expected post-op volume excess,I/O's essentially balancedyesterday, weight >> preop but stable last several days Acute kidney injury with essentially stable BUN/creatinine - somewhat improved UOP on lasix drip  HCAP with RUL opacity on CXR and CT Pericardial effusion - small and insignificant Bilateral pleural effusions s/p bilateral Pleur-X catheter placement  Permanent atrial fibrillation now s/p maze procedure - HR stable and well controlled Coumadin on hold due to elevated INR Expected post op acute blood loss anemia, Hgb stable 8.5yesterday Longstanding rheumatic heart disease  Pulmonary hypertension Morbid obesity Physical deconditioning - severe Protein-depleted malnutrition  Chronic venous insufficiency   Continue empiric Vanc + Fortaz for HCAP, day #2  Continue plans per advanced heart failure team  Encourage po's - discussed possibility of placing feeding tube with patient who claims she will try harder to eat but right heart failure and renal failure likely  source of anorexia and nausea  Continue to hold coumadin  PT/OT  Mobilize as much as possible  Coumadin 1 mg tonight  Purcell Nailslarence H Owen, MD 07/20/2016 8:08 AM

## 2016-07-20 NOTE — Progress Notes (Signed)
Speech Language Pathology Treatment: Dysphagia  Patient Details Name: Ann PoeGloria Eckroth MRN: [id number] DOB: 1940-12-11 Today's Date: 07/20/2016 Time: [id number] SLP Time Calculation (min) (ACUTE ONLY): 24 min  Assessment / Plan / Recommendation Clinical Impression  Pt seen with meal tray items (thin liquids, pureed solids) and additional regular solids trials. She reports having minimal appetite due to stomach discomfort, but is requesting specific food items. Dentures were placed for solid trials and mild oral residue, as well as prolonged mastication occurred. This cleared with additional swallows and thin liquid washes. Pt displayed no overt s/s of aspiration with any consistency. Given bedside performance, recommend advancing to Dys 3 and thin liquids to allow for additional dietary options to aid in pt's intake.   HPI HPI: Patient is a 75 yo female admitted 04-05-2016 due to severe mitral valve stenosis and tricuspid valve regurgitation, now s/p MVR, TVR, and Maze procedure same date, bronchoscopy 10/7 and 10/9, intubation 10/10-10/16 with pleurex placed.   PMH:  morbid obesity, HOH, poor eyesight, mitral stenosis, tricuspid regurg, rheumatic heart disease, anxiety, Afib, CAD, MI, CHF, venous insufficiency, chronic LE edema, HTN, CKD, asthma, chronic back pain      SLP Plan  Continue with current plan of care     Recommendations  Diet recommendations: Dysphagia 3 (mechanical soft);Thin liquid Liquids provided via: Cup;Straw Medication Administration: Whole meds with puree Supervision: Patient able to self feed;Full supervision/cueing for compensatory strategies Compensations: Minimize environmental distractions;Slow rate;Small sips/bites;Follow solids with liquid Postural Changes and/or Swallow Maneuvers: Seated upright 90 degrees;Upright 30-60 min after meal                Oral Care Recommendations: Oral care BID Follow up Recommendations: Skilled Nursing facility Plan: Continue  with current plan of care       GO               Tollie EthHaleigh Ragan Livianna Petraglia, Student SLP  Caryl NeverHaleigh R Franky Reier 07/20/2016, 1:08 PM

## 2016-07-21 ENCOUNTER — Inpatient Hospital Stay (HOSPITAL_COMMUNITY): Payer: Medicare Other

## 2016-07-21 LAB — GLUCOSE, CAPILLARY
GLUCOSE-CAPILLARY: 116 mg/dL — AB (ref 65–99)
GLUCOSE-CAPILLARY: 138 mg/dL — AB (ref 65–99)
Glucose-Capillary: 108 mg/dL — ABNORMAL HIGH (ref 65–99)
Glucose-Capillary: 117 mg/dL — ABNORMAL HIGH (ref 65–99)
Glucose-Capillary: 120 mg/dL — ABNORMAL HIGH (ref 65–99)
Glucose-Capillary: 124 mg/dL — ABNORMAL HIGH (ref 65–99)

## 2016-07-21 LAB — CBC
HEMATOCRIT: 25.8 % — AB (ref 36.0–46.0)
HEMOGLOBIN: 8.2 g/dL — AB (ref 12.0–15.0)
MCH: 30.5 pg (ref 26.0–34.0)
MCHC: 31.8 g/dL (ref 30.0–36.0)
MCV: 95.9 fL (ref 78.0–100.0)
Platelets: 186 10*3/uL (ref 150–400)
RBC: 2.69 MIL/uL — ABNORMAL LOW (ref 3.87–5.11)
RDW: 17.8 % — AB (ref 11.5–15.5)
WBC: 6.9 10*3/uL (ref 4.0–10.5)

## 2016-07-21 LAB — BASIC METABOLIC PANEL
Anion gap: 11 (ref 5–15)
BUN: 75 mg/dL — ABNORMAL HIGH (ref 6–20)
CHLORIDE: 93 mmol/L — AB (ref 101–111)
CO2: 28 mmol/L (ref 22–32)
Calcium: 9 mg/dL (ref 8.9–10.3)
Creatinine, Ser: 2.29 mg/dL — ABNORMAL HIGH (ref 0.44–1.00)
GFR, EST AFRICAN AMERICAN: 23 mL/min — AB (ref >60)
GFR, EST NON AFRICAN AMERICAN: 20 mL/min — AB (ref >60)
GLUCOSE: 114 mg/dL — AB (ref 65–99)
Potassium: 3.6 mmol/L (ref 3.5–5.1)
Sodium: 132 mmol/L — ABNORMAL LOW (ref 135–145)

## 2016-07-21 LAB — PROTIME-INR
INR: 3.04
PROTHROMBIN TIME: 32.2 s — AB (ref 11.4–15.2)

## 2016-07-21 LAB — COOXEMETRY PANEL
Carboxyhemoglobin: 1.9 % — ABNORMAL HIGH (ref 0.5–1.5)
METHEMOGLOBIN: 1 % (ref 0.0–1.5)
O2 Saturation: 75.7 %
TOTAL HEMOGLOBIN: 8.3 g/dL — AB (ref 12.0–16.0)

## 2016-07-21 LAB — MAGNESIUM: MAGNESIUM: 2.6 mg/dL — AB (ref 1.7–2.4)

## 2016-07-21 MED ORDER — FUROSEMIDE 10 MG/ML IJ SOLN
160.0000 mg | Freq: Three times a day (TID) | INTRAVENOUS | Status: DC
  Administered 2016-07-21 – 2016-07-24 (×9): 160 mg via INTRAVENOUS
  Filled 2016-07-21 (×12): qty 16

## 2016-07-21 MED ORDER — METOLAZONE 5 MG PO TABS
5.0000 mg | ORAL_TABLET | Freq: Two times a day (BID) | ORAL | Status: DC
  Administered 2016-07-21 – 2016-07-22 (×3): 5 mg via ORAL
  Filled 2016-07-21 (×3): qty 1

## 2016-07-21 MED ORDER — WARFARIN SODIUM 1 MG PO TABS: 1.0000 mg | ORAL_TABLET | Freq: Every day | ORAL | Status: DC

## 2016-07-21 MED ORDER — METOLAZONE 5 MG PO TABS: 10.0000 mg | ORAL_TABLET | Freq: Once | ORAL | Status: DC

## 2016-07-21 MED ORDER — DEXTROSE 5 % IV SOLN: 160.0000 mg | Freq: Three times a day (TID) | INTRAVENOUS | Status: DC

## 2016-07-21 MED ORDER — POLYSACCHARIDE IRON COMPLEX 150 MG PO CAPS
150.0000 mg | ORAL_CAPSULE | Freq: Every day | ORAL | Status: DC
  Administered 2016-07-21 – 2016-07-22 (×2): 150 mg via ORAL
  Filled 2016-07-21 (×4): qty 1

## 2016-07-21 MED ORDER — FA-PYRIDOXINE-CYANOCOBALAMIN 2.5-25-2 MG PO TABS
1.0000 | ORAL_TABLET | Freq: Every day | ORAL | Status: DC
Start: 2016-07-21 — End: 2016-07-24
  Administered 2016-07-21 – 2016-07-22 (×2): 1 via ORAL
  Filled 2016-07-21 (×4): qty 1

## 2016-07-21 MED ORDER — POTASSIUM CHLORIDE CRYS ER 20 MEQ PO TBCR
40.0000 meq | EXTENDED_RELEASE_TABLET | Freq: Two times a day (BID) | ORAL | Status: AC
  Administered 2016-07-21 (×2): 40 meq via ORAL
  Filled 2016-07-21 (×2): qty 2

## 2016-07-21 MED ORDER — DOCUSATE SODIUM 100 MG PO CAPS
200.0000 mg | ORAL_CAPSULE | Freq: Two times a day (BID) | ORAL | Status: DC
  Administered 2016-07-21 – 2016-07-22 (×2): 200 mg via ORAL
  Filled 2016-07-21 (×2): qty 2

## 2016-07-21 NOTE — Progress Notes (Signed)
[address]       [name] [address]             [phone]        CARDIOTHORACIC SURGERY PROGRESS NOTE  R26Days Post-Op Procedure(s) (LRB): MITRAL VALVE (MV) REPLACEMENT (N/A) TRICUSPID VALVE REPAIR (N/A) MAZE (N/A) TRANSESOPHAGEAL ECHOCARDIOGRAM (TEE) (N/A)   R12 Days Post-Op Procedure(s) (LRB): INSERTION PLEURAL DRAINAGE CATHETER (Bilateral) VIDEO BRONCHOSCOPY (N/A)  Subjective: Refused to get up early this morning.  Eating some but intake remains inconsistent  Objective: Vital signs: BP Readings from Last 1 Encounters:  07/21/16 (!) 88/52   Pulse Readings from Last 1 Encounters:  07/21/16 73   Resp Readings from Last 1 Encounters:  07/21/16 16   Temp Readings from Last 1 Encounters:  07/21/16 97.7 F (36.5 C) (Oral)    Hemodynamics: CVP:  [16 mmHg-19 mmHg] 16 mmHg  Mixed venous co-ox 76%  Physical Exam:  Rhythm:   Afib w/ controlled rate  Breath sounds: Diminished at bases  Heart sounds:  RRR  Incisions:  Clean and dry  Abdomen:  Soft, non-distended, non-tender  Extremities:  Warm, well-perfused    Intake/Output from previous day: 10/23 0701 - 10/24 0700 In: 1425.1 [P.O.:50; I.V.:1025.1; IV Piggyback:350] Out: 960 [Urine:510] Intake/Output this shift: No intake/output data recorded.  Lab Results:  CBC: Recent Labs  07/19/16 0430 07/21/16 0513  WBC 8.0 6.9  HGB 8.5* 8.2*  HCT 26.8* 25.8*  PLT 175 186    BMET:  Recent Labs  07/20/16 1620 07/21/16 0513  NA 133* 132*  K 3.5 3.6  CL 95* 93*  CO2 27 28  GLUCOSE 134* 114*  BUN 74* 75*  CREATININE 2.30* 2.29*  CALCIUM 9.1 9.0     PT/INR:   Recent Labs  07/21/16 0513  LABPROT 32.2*  INR 3.04    CBG (last 3)   Recent Labs  07/20/16 2322 07/21/16 0440 07/21/16 0720  GLUCAP 114* 117* 116*    ABG    Component Value Date/Time   PHART 7.451 (H) 07/11/2016 1205   PCO2ART 49.1 (H) 07/11/2016 1205   PO2ART 78.2 (L) 07/11/2016 1205   HCO3 33.9  (H) 07/11/2016 1205   TCO2 34 07/02/2016 1749   ACIDBASEDEF 3.0 (H) 06/26/2016 0859   O2SAT 75.7 07/21/2016 0428    CXR: Stable CHF and mild bibasilar atelectasis L>R  Assessment/Plan:  Clinically stable but not progressing Acute on chronic respiratory failure, stable Breathing comfortably w/ O2 sats 96-100% on 5L/min via [place] Maintaining stablerhythm and BPon dobutamine@5 , mixed venous co-ox 76%, CVP 16 Pulmonary hypertension w/ right sided heart failure Acute on chronic diastolic CHF with expected post-op volume excess,I/O's essentially balancedlast few days, weight >>preop Acute kidney injury on CKD with essentially stable BUN/creatinine - somewhat improved UOP on lasix drip  HCAP with RUL opacity on CXR and CT - improved on today's CXR Pericardial effusion - small and insignificant Bilateral pleural effusions s/p bilateral Pleur-X catheter placement  Permanent atrial fibrillation now s/p maze procedure - HR stable and well controlled Chronic anticoagulation on warfarin Expected post op acute blood loss anemia, Hgb down slightly8.2 Longstanding rheumatic heart disease  Morbid obesity Physical deconditioning - severe Protein-depleted malnutrition - severe Chronic venous insufficiency   Continue empiric Vanc + Fortaz for HCAP, day #3  Continue plans per advanced heart failure team  Encourage po's - discussed possibility of placing feeding tube with patient who claims she will try harder to eat but right heart failure and renal  failure likely source of anorexia and nausea  PT/OT  Mobilize as much as possible  Coumadin 1 mg tonight  Supplement potassium  Purcell Nailslarence H Owen, MD 07/21/2016 7:50 AM

## 2016-07-21 NOTE — Progress Notes (Signed)
CT surgery p.m. Rounds  Patient currently resting in bed, breathing comfortably She declines her dinner this p.m. Assist 4 required to get her up to the chair today Plan for CPAP this at bedtime

## 2016-07-21 NOTE — Progress Notes (Signed)
Occupational Therapy Treatment Patient Details Name: Ann Potter Disbro MRN: [id number] DOB: 21-Jun-1941 Today's Date: 07/21/2016    History of present illness Patient is a 75 yo female admitted 06/19/2016 due to severe mitral valve stenosis and tricuspid valve regurgitation, now s/p MVR, TVR, and Maze procedure same date, bronchoscopy 10/7 and 10/9, intubation 10/10-10/16 with pleurex placed.   PMH:  morbid obesity, HOH, poor eyesight, mitral stenosis, tricuspid regurg, rheumatic heart disease, anxiety, Afib, CAD, MI, CHF, venous insufficiency, chronic LE edema, HTN, CKD, asthma, chronic back pain   OT comments  Pt sitting up in recliner.  Adamantly refused attempts to stand despite max encouragement and risks of immobility explained.   Pt only agreeable to UE and LE exercise and required encouragement to perform these.  Will continue attempts.   Follow Up Recommendations  SNF    Equipment Recommendations  None recommended by OT    Recommendations for Other Services      Precautions / Restrictions Precautions Precautions: Sternal;Fall Precaution Comments:  Reviewed sternal precautions pt able to state 4/5       Mobility Bed Mobility                  Transfers                 General transfer comment: Pt adamantly refusing despite max attempts and encouragement.  Risks of immobility and prolonged recovery explained to pt to no avail     Balance                                   ADL                                                Vision                     Perception     Praxis      Cognition   Behavior During Therapy: [facility] for tasks assessed/performed Overall Cognitive Status: Within Functional Limits for tasks assessed                       Extremity/Trunk Assessment               Exercises General Exercises - Upper Extremity Shoulder Flexion: AAROM;Right;Left;10 reps;Seated Shoulder ABduction:  AROM;Right;Left;10 reps;Seated Elbow Flexion: AROM;Right;Left;10 reps;Seated Elbow Extension: AROM;10 reps;Right;Left;Seated General Exercises - Lower Extremity Hip ABduction/ADduction: AAROM;Right;Left;10 reps;Seated Hip Flexion/Marching: AAROM;Both;10 reps;Seated   Shoulder Instructions       General Comments      Pertinent Vitals/ Pain       Pain Assessment: Faces Faces Pain Scale: Hurts little more Pain Location: bil. knees.  Lt > Rt  Pain Descriptors / Indicators: Aching;Grimacing Pain Intervention(s): Monitored during session;Repositioned  Home Living                                          Prior Functioning/Environment              Frequency  Min 2X/week        Progress Toward Goals  OT Goals(current goals can now be found in the care plan section)  Progress towards OT goals: Not progressing toward goals - comment (slowly )     Plan Discharge plan remains appropriate    Co-evaluation                 End of Session     Activity Tolerance Patient limited by pain   Patient Left in chair;with call bell/phone within reach   Nurse Communication Mobility status        Time: 9604-5409 OT Time Calculation (min): 25 min  Charges: OT General Charges $OT Visit: 1 Procedure OT Treatments $Self Care/Home Management : 8-22 mins $Therapeutic Exercise: 8-22 mins  Sylvan Lahm M 07/21/2016, 4:23 PM

## 2016-07-21 NOTE — Progress Notes (Signed)
Patient ID: Ann Potter, female   DOB: 02-16-1941, 75 y.o.   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 TEE on 9/28 EF 55-60%. Post-operative course complicated by respiratory failure, AKI, hypotension, and marked volume overload. Extubated but required reintubation 10/10. Later extubated 10/19.  She has had 3 bronchoscopies --> 10/7, 10/9, 10/12. Pleurex catheters placed  10/12 on R and L.   Echo LVEF 55-60%, moderate posterior pericardial effusion. MVR/TV repair ok. RV mildly dilated and mildly reduced systolic function.   RVSP  CT chest: Possible RUL PNA, small hemorrhagic pericardial effusion.   On dobutamine 5. Coox 75.7%. SBP 120s-130s.    Remains on Lasix drip at 15mg /hr. Got metolazone yesterday. I/O remain positive.   States she is feeling OK this morning. Had a cramp in leg.  States she is more sleepy after pain medicine.     Creatinine 2.36 => 2.25 => 2.30 => 2.29. BUN 75. I/O positive.  Weight up 2 lbs.   Objective:   Weight Range:  Vital Signs:   Temp:  [97.4 F (36.3 C)-97.7 F (36.5 C)] 97.7 F (36.5 C) (10/24 0726) Pulse Rate:  [66-79] 73 (10/24 0915) Resp:  [14-28] 26 (10/24 0915) BP: (74-132)/(33-112) 125/112 (10/24 0800) SpO2:  [87 %-100 %] 100 % (10/24 0915) FiO2 (%):  [30 %] 30 % (10/23 2222) Weight:  [255 lb 8.2 oz (115.9 kg)] 255 lb 8.2 oz (115.9 kg) (10/24 0600) Last BM Date: 07/20/16  Weight change: Filed Weights   07/19/16 0500 07/20/16 0451 07/21/16 0600  Weight: 254 lb 10.1 oz (115.5 kg) 253 lb 4.9 oz (114.9 kg) 255 lb 8.2 oz (115.9 kg)    Intake/Output:   Intake/Output Summary (Last 24 hours) at 07/21/16 0934 Last data filed at 07/21/16 0900  Gross per 24 hour  Intake            1548.8 ml  Output             1055 ml  Net            493.8 ml     Physical Exam: CVP 14-15 General: Chronically ill appearing. Obese. Lying in bed.  HEENT: normal Neck: Thick. JVP difficult to assess due to size. Carotids 2+ bilat; no bruits. No lymphadenopathy or thyromegaly noted Cor: PMI nondisplaced. Irregular rate & rhythm. No M/G/R. Sternal incision approximated.  Lungs: Decreased anterior breath sounds.  Abdomen: obese, soft, NT, ND, no HSM. No bruits or masses. +BS  Extremities: no cyanosis, clubbing, rash. 1-2+ edema to knees. Neuro: alert & orientedx3, cranial nerves grossly intact. moves all 4 extremities w/o difficulty. Affect pleasant GU: Foley  Telemetry: Reviewed, AF 70s   Labs: Basic Metabolic Panel:  Recent Labs Lab 07/18/16 0343 07/19/16 0430 07/20/16 0540 07/20/16 1620 07/21/16 0513  NA 134* 135 135 133* 132*  K 4.8 4.1 2.8* 3.5 3.6  CL 98* 96* 96* 95* 93*  CO2 26 26 27 27 28   GLUCOSE 116* 118* 120* 134* 114*  BUN 64* 66* 71* 74* 75*  CREATININE 2.15* 2.36* 2.25* 2.30* 2.29*  CALCIUM 9.0 9.3 9.0 9.1 9.0  MG  --   --  1.7  --  2.6*    Liver  Function Tests:  Recent Labs Lab 07/16/16 0452 07/19/16 0430 07/20/16 0540  AST 26 23 23   ALT 18 15 16   ALKPHOS 113 99 92  BILITOT 1.5* 2.0* 1.8*  PROT 5.7* 5.8* 5.6*  ALBUMIN 2.6* 3.3* 3.1*   No results for input(s): LIPASE, AMYLASE in the last 168 hours. No results for input(s): AMMONIA in the last 168 hours.  CBC:  Recent Labs Lab 07/16/16 0452 07/17/16 0500 07/18/16 0343 07/19/16 0430 07/21/16 0513  WBC 8.6 8.5 7.5 8.0 6.9  HGB 9.4* 8.9* 8.3* 8.5* 8.2*  HCT 30.2* 28.7* 26.4* 26.8* 25.8*  MCV 96.2 97.3 96.4 96.1 95.9  PLT 132* 129* 126* 175 186    Cardiac Enzymes: No results for input(s): CKTOTAL, CKMB, CKMBINDEX, TROPONINI in the last 168 hours.  BNP: BNP (last 3 results) No results for input(s): BNP in the last 8760 hours.  ProBNP (last 3 results) No results for input(s):  PROBNP in the last 8760 hours.    Other results:  Imaging: Dg Chest Port 1 View  Result Date: 07/21/2016 CLINICAL DATA:  Shortness of breath. EXAM: PORTABLE CHEST 1 VIEW COMPARISON:  Radiograph of July 19, 2016. FINDINGS: Stable cardiomegaly. Status post cardiac valve replacement. Right-sided PICC line is unchanged in position. Bilateral pleural drainage catheters again noted and unchanged. Stable diffuse bilateral lung opacities are noted concerning for edema or inflammation. Stable probable mild bilateral pleural effusions are noted. Bony thorax is unremarkable. IMPRESSION: Stable support apparatus. Stable bilateral lung opacities and small pleural effusions as described above. Electronically Signed   By: Lupita Raider, M.D.   On: 07/21/2016 08:59     Medications:     Scheduled Medications: . alteplase  2 mg Intracatheter Once  . bisacodyl  10 mg Oral Daily   Or  . bisacodyl  10 mg Rectal Daily  . cefTAZidime (FORTAZ)  IV  2 g Intravenous Q24H  . docusate sodium  200 mg Oral BID  . feeding supplement  1 Container Oral TID BM  . feeding supplement (PRO-STAT SUGAR FREE 64)  30 mL Oral BID  . folic acid-pyridoxine-cyancobalamin  1 tablet Oral Daily  . insulin aspart  0-15 Units Subcutaneous Q4H  . ipratropium  0.5 mg Nebulization TID  . iron polysaccharides  150 mg Oral Daily  . levalbuterol  1.25 mg Nebulization TID  . levothyroxine  50 mcg Oral QAC breakfast  . mouth rinse  15 mL Mouth Rinse Q12H  . metolazone  5 mg Oral BID  . nystatin ointment   Topical BID  . sodium chloride flush  10-40 mL Intracatheter Q12H  . sodium chloride flush  3 mL Intravenous Q12H  . vancomycin  1,250 mg Intravenous Q48H    Infusions: . sodium chloride 30 mL/hr at 07/21/16 0600  . DOBUTamine 4.974 mcg/kg/min (07/21/16 0900)  . furosemide (LASIX) infusion 15 mg/hr (07/21/16 0900)  . norepinephrine (LEVOPHED) Adult infusion Stopped (07/19/16 2015)    PRN Medications: Place/Maintain  arterial line **AND** sodium chloride, acetaminophen, Gerhardt's butt cream, ipratropium-albuterol, ondansetron (ZOFRAN) IV, RESOURCE THICKENUP CLEAR, sodium chloride flush, sodium chloride flush   Assessment/Plan:    1. Hypotension:  Echo showed LV EF normal.  RV function is reduced but not markedly so.  Some concern for effusive-constrictive pericarditis based on septal bounce but mitral inflow respirometry is not consistent with this and effusion is relatively small.  Suspect hypotension has led to ATN on top of baseline PAH.  - BP now stable.  - Blood cultures NGTD 2. Acute  on chronic diastolic CHF with RV failure:  - Remains volume overloaded. CVP 14-15. Co-ox 75.7%.  - Remains on dobutamine 5 mcg/kg/min for RV support.  - Continue Lasix gtt and increase metolazone to 5 mg BID.  - Will need RHC, PASP on echo only 31mmHg but suspect PA pressure is probably higher with longstanding MV disease.  May benefit from switching dobutamine to milrinone but stable on dobutamine now. Will continue.  INR 3.04 today. Per Dr. Shirlee LatchMclean, may have to wait until Thursday.  3. AKI: Suspect ATN in setting of hypotension.  - Relatively stable, though remains elevated.   4. Pericardial effusion: Moderate posterior organized pericardial effusion, concern for effusive-constrictive pericarditis but MV inflow respirometry not suggestive (though not sure that MV inflow respirometry is as accurate with bioprosthetic MV).  Effusion small by chest CT.  5. S/p bioprosthetic MV: Valve appears to function normally on echo.  6. Atrial fibrillation: Persistent. Back in AF s/p Maze.  On warfarin. INR 3.04.  Awaiting down-trend for RHC.   - Hold warfarin for now per Dr. Shirlee LatchMcLean. Would like to get INR close to 2.0 range for RHC. - Consider DCCV once diuresed and off dobutamine.  7. Pleural effusions bilaterally: Pleurx catheters per TCTS 8. ID: Possible right upper lobe PNA by CT chest.  Empiric vanco/ceftazidime.   Length of  Stay: [address]  Luane SchoolMichael Andrew Tillery, PA-C 07/21/2016, 9:34 AM  Advanced Heart Failure Team Pager [phone] (M-F; 7a - 4p)  Please contact [facility] for night-coverage after hours (4p -7a ) and weekends on [email or web address]  Patient seen with PA, agree with the above note.  Good co-ox at 76% today.  Renal function is stable but still not getting good urine output.  CVP remains around 15.  - Stop Lasix gtt, will change to Lasix 160 mg IV every 8 hrs.  Continue metolazone 5 mg bid.  Follow renal function closely.  - SBP 90s currently, leave dobutamine at current dose for RV support.  Will try to begin down-titration tomorrow.  - Will need RHC eventually, would like to see INR closer to 2 (it is 3 today).  - Mobilize with PT.   Ann Potter 07/21/2016 1:16 PM

## 2016-07-21 NOTE — Progress Notes (Signed)
Pt refuses to get out of bed. States, " The doctor says my legs will never work again." Pt educated on the importance of continued mobility training to help promote increased mobility. Pt agrees to attempt to get out of bed Later in the day.

## 2016-07-22 ENCOUNTER — Inpatient Hospital Stay (HOSPITAL_COMMUNITY): Payer: Medicare Other

## 2016-07-22 LAB — GLUCOSE, CAPILLARY
GLUCOSE-CAPILLARY: 112 mg/dL — AB (ref 65–99)
GLUCOSE-CAPILLARY: 121 mg/dL — AB (ref 65–99)
GLUCOSE-CAPILLARY: 134 mg/dL — AB (ref 65–99)
GLUCOSE-CAPILLARY: 86 mg/dL (ref 65–99)
Glucose-Capillary: 109 mg/dL — ABNORMAL HIGH (ref 65–99)
Glucose-Capillary: 153 mg/dL — ABNORMAL HIGH (ref 65–99)

## 2016-07-22 LAB — BASIC METABOLIC PANEL
Anion gap: 11 (ref 5–15)
BUN: 85 mg/dL — ABNORMAL HIGH (ref 6–20)
CALCIUM: 9.4 mg/dL (ref 8.9–10.3)
CO2: 27 mmol/L (ref 22–32)
CREATININE: 2.31 mg/dL — AB (ref 0.44–1.00)
Chloride: 96 mmol/L — ABNORMAL LOW (ref 101–111)
GFR calc Af Amer: 22 mL/min — ABNORMAL LOW (ref >60)
GFR calc non Af Amer: 19 mL/min — ABNORMAL LOW (ref >60)
GLUCOSE: 106 mg/dL — AB (ref 65–99)
Potassium: 4 mmol/L (ref 3.5–5.1)
Sodium: 134 mmol/L — ABNORMAL LOW (ref 135–145)

## 2016-07-22 LAB — CBC
HEMATOCRIT: 26.1 % — AB (ref 36.0–46.0)
Hemoglobin: 8.4 g/dL — ABNORMAL LOW (ref 12.0–15.0)
MCH: 30.7 pg (ref 26.0–34.0)
MCHC: 32.2 g/dL (ref 30.0–36.0)
MCV: 95.3 fL (ref 78.0–100.0)
Platelets: 207 10*3/uL (ref 150–400)
RBC: 2.74 MIL/uL — ABNORMAL LOW (ref 3.87–5.11)
RDW: 17.7 % — AB (ref 11.5–15.5)
WBC: 9.2 10*3/uL (ref 4.0–10.5)

## 2016-07-22 LAB — CULTURE, BLOOD (ROUTINE X 2)
CULTURE: NO GROWTH
Culture: NO GROWTH

## 2016-07-22 LAB — COOXEMETRY PANEL
Carboxyhemoglobin: 2.3 % — ABNORMAL HIGH (ref 0.5–1.5)
Carboxyhemoglobin: 2.3 % — ABNORMAL HIGH (ref 0.5–1.5)
Methemoglobin: 0.8 % (ref 0.0–1.5)
Methemoglobin: 0.8 % (ref 0.0–1.5)
O2 SAT: 68.3 %
O2 Saturation: 67.3 %
TOTAL HEMOGLOBIN: 8.3 g/dL — AB (ref 12.0–16.0)
Total hemoglobin: 9.5 g/dL — ABNORMAL LOW (ref 12.0–16.0)

## 2016-07-22 LAB — PROTIME-INR
INR: 2.55
Prothrombin Time: 27.9 seconds — ABNORMAL HIGH (ref 11.4–15.2)

## 2016-07-22 MED ORDER — MILRINONE LACTATE IN DEXTROSE 20-5 MG/100ML-% IV SOLN
0.2500 ug/kg/min | INTRAVENOUS | Status: DC
  Administered 2016-07-22: 0.25 ug/kg/min via INTRAVENOUS
  Filled 2016-07-22: qty 100

## 2016-07-22 MED ORDER — SODIUM CHLORIDE 0.9 % IV SOLN: INTRAVENOUS | Status: DC

## 2016-07-22 MED ORDER — SODIUM CHLORIDE 0.9% FLUSH: 3.0000 mL | Freq: Two times a day (BID) | INTRAVENOUS | Status: DC

## 2016-07-22 MED ORDER — SODIUM CHLORIDE 0.9% FLUSH: 3.0000 mL | INTRAVENOUS | Status: DC | PRN

## 2016-07-22 MED ORDER — SODIUM CHLORIDE 0.9 % IV SOLN: 250.0000 mL | INTRAVENOUS | Status: DC | PRN

## 2016-07-22 MED ORDER — METOLAZONE 5 MG PO TABS
10.0000 mg | ORAL_TABLET | Freq: Two times a day (BID) | ORAL | Status: DC
  Administered 2016-07-22 – 2016-07-23 (×3): 10 mg via ORAL
  Filled 2016-07-22 (×3): qty 2

## 2016-07-22 MED ORDER — ACETAZOLAMIDE 250 MG PO TABS
250.0000 mg | ORAL_TABLET | Freq: Once | ORAL | Status: AC
  Administered 2016-07-22: 250 mg via ORAL
  Filled 2016-07-22: qty 1

## 2016-07-22 MED ORDER — DOBUTAMINE IN D5W 4-5 MG/ML-% IV SOLN
3.0000 ug/kg/min | INTRAVENOUS | Status: DC
  Administered 2016-07-23: 3 ug/kg/min via INTRAVENOUS
  Filled 2016-07-22: qty 250

## 2016-07-22 MED ORDER — METOLAZONE 5 MG PO TABS
5.0000 mg | ORAL_TABLET | Freq: Once | ORAL | Status: AC
Start: 2016-07-22 — End: 2016-07-22
  Administered 2016-07-22: 5 mg via ORAL
  Filled 2016-07-22: qty 1

## 2016-07-22 NOTE — Progress Notes (Signed)
Pharmacy Antibiotic Note  Ann PoeGloria Potter is a 75 y.o. female who continues on vancomycin and ceftazidime for possible HCAP. Serum creatinine elevated but stable at 2.31, CrCl 24 ml/min.  Vancomycin trough 15-20  Plan: 1) Continue vancomycin 1250mg  IV q48 2) Continue ceftazidime 2g IV q24  Height: 5\' 1"  (154.9 cm) Weight: 253 lb 12 oz (115.1 kg) IBW/kg (Calculated) : 47.8  Temp (24hrs), Avg:97.9 F (36.6 C), Min:97.4 F (36.3 C), Max:98.5 F (36.9 C)   Recent Labs Lab 07/17/16 0500 07/18/16 0343 07/19/16 0430 07/20/16 0540 07/20/16 1620 07/21/16 0513 07/22/16 0417  WBC 8.5 7.5 8.0  --   --  6.9 9.2  CREATININE 2.01* 2.15* 2.36* 2.25* 2.30* 2.29* 2.31*    Estimated Creatinine Clearance: 24.4 mL/min (by C-G formula based on SCr of 2.31 mg/dL (H)).    Allergies  Allergen Reactions  . Relafen [Nabumetone] Other (See Comments)    Bladder pain    Antimicrobials this admission: Cefuroxime 9/28>>9/30 Ceftaz 10/6 >>10/17, resume 10/22>> Vancomycin 9/28 x 2 (OR day). Resume 10/9>>10/17, resume 10/22 >>  Dose adjustments this admission: 10/13: VT=32 on 1250mg /24h. Hold Vanco 10/14: VR=25 HOLD 10/15: VR=18 resume at 1250mg /48h (UOP dropping)  Microbiology results: 10/20 BCx x2: ngtd 10/20 UCx: < 10,000 colonies 10/10 acid fast smear (BAL) - negative 10/10 BAL specimen B  - no organisms seen 10/10 BAL specimen C - no organisms seen 10/9 bronchial washings - 10K normal flora 10/9 TA - normal flora 10/6 TA - no organisms seen 9/25 MRSA PCR neg  Thank you for allowing pharmacy to be a part of this patient's care.  Fredrik RiggerMarkle, Shalay Carder Sue 07/22/2016 10:20 AM

## 2016-07-22 NOTE — Progress Notes (Addendum)
TCTS DAILY ICU PROGRESS NOTE                   [address]            [place] [id number]          [phone]   13 Days Post-Op Procedure(s) (LRB): INSERTION PLEURAL DRAINAGE CATHETER (Bilateral) VIDEO BRONCHOSCOPY (N/A)  Total Length of Stay:  LOS: 27 days   Subjective: Patient not comfortable in chair this am. She states she is trying to eat more, but not much appetite.  Objective: Vital signs in last 24 hours: Temp:  [97.4 F (36.3 C)-98.5 F (36.9 C)] 97.4 F (36.3 C) (10/25 0400) Pulse Rate:  [67-81] 80 (10/25 0600) Cardiac Rhythm: Atrial fibrillation (10/25 0734) Resp:  [13-26] 24 (10/25 0600) BP: (91-192)/(44-171) 192/171 (10/25 0600) SpO2:  [87 %-100 %] 95 % (10/25 0746) FiO2 (%):  [30 %] 30 % (10/24 2153) Weight:  [253 lb 12 oz (115.1 kg)] 253 lb 12 oz (115.1 kg) (10/25 0500)  Filed Weights   07/20/16 0451 07/21/16 0600 07/22/16 0500  Weight: 253 lb 4.9 oz (114.9 kg) 255 lb 8.2 oz (115.9 kg) 253 lb 12 oz (115.1 kg)    Weight change: -1 lb 12.2 oz (-0.8 kg)   Hemodynamic parameters for last 24 hours: CVP:  [8 mmHg-14 mmHg] 12 mmHg  Intake/Output from previous day: 10/24 0701 - 10/25 0700 In: 1892.8 [P.O.:200; I.V.:1216.8; IV Piggyback:476] Out: 1620 [Urine:1620]  Intake/Output this shift: Total I/O In: 50 [IV Piggyback:50] Out: -   Current Meds: Scheduled Meds: . alteplase  2 mg Intracatheter Once  . bisacodyl  10 mg Oral Daily   Or  . bisacodyl  10 mg Rectal Daily  . cefTAZidime (FORTAZ)  IV  2 g Intravenous Q24H  . docusate sodium  200 mg Oral BID  . feeding supplement  1 Container Oral TID BM  . feeding supplement (PRO-STAT SUGAR FREE 64)  30 mL Oral BID  . folic acid-pyridoxine-cyancobalamin  1 tablet Oral Daily  . furosemide  160 mg Intravenous TID  . insulin aspart  0-15 Units Subcutaneous Q4H  . ipratropium  0.5 mg Nebulization TID  . iron polysaccharides  150 mg Oral Daily  . levalbuterol  1.25 mg Nebulization TID  .  levothyroxine  50 mcg Oral QAC breakfast  . mouth rinse  15 mL Mouth Rinse Q12H  . metolazone  5 mg Oral BID  . nystatin ointment   Topical BID  . sodium chloride flush  10-40 mL Intracatheter Q12H  . sodium chloride flush  3 mL Intravenous Q12H  . vancomycin  1,250 mg Intravenous Q48H   Continuous Infusions: . sodium chloride 30 mL/hr at 07/22/16 0600  . DOBUTamine 5 mcg/kg/min (07/22/16 0729)  . norepinephrine (LEVOPHED) Adult infusion Stopped (07/19/16 2015)   PRN Meds:.Place/Maintain arterial line **AND** sodium chloride, acetaminophen, Gerhardt's butt cream, ipratropium-albuterol, ondansetron (ZOFRAN) IV, RESOURCE THICKENUP CLEAR, sodium chloride flush, sodium chloride flush  General appearance: alert, cooperative and no distress Heart: IRRR IRRR Lungs: Diminshed at bases Extremities: ++++ LE edema Wound: Sternal wound is clean and dry  Lab Results: CBC: Recent Labs  07/21/16 0513 07/22/16 0417  WBC 6.9 9.2  HGB 8.2* 8.4*  HCT 25.8* 26.1*  PLT 186 207   BMET:  Recent Labs  07/21/16 0513 07/22/16 0417  NA 132* 134*  K 3.6 4.0  CL 93* 96*  CO2 28 27  GLUCOSE 114* 106*  BUN 75* 85*  CREATININE  2.29* 2.31*  CALCIUM 9.0 9.4    CMET: Lab Results  Component Value Date   WBC 9.2 07/22/2016   HGB 8.4 (L) 07/22/2016   HCT 26.1 (L) 07/22/2016   PLT 207 07/22/2016   GLUCOSE 106 (H) 07/22/2016   TRIG 89 07/10/2016   ALT 16 07/20/2016   AST 23 07/20/2016   NA 134 (L) 07/22/2016   K 4.0 07/22/2016   CL 96 (L) 07/22/2016   CREATININE 2.31 (H) 07/22/2016   BUN 85 (H) 07/22/2016   CO2 27 07/22/2016   INR 2.55 07/22/2016   HGBA1C 5.3 06/22/2016    PT/INR:  Recent Labs  07/22/16 0417  LABPROT 27.9*  INR 2.55   Radiology: No results found.   Assessment/Plan: S/P Procedure(s) (LRB): INSERTION PLEURAL DRAINAGE CATHETER (Bilateral) VIDEO BRONCHOSCOPY (N/A)  1.CV-A fib. On Dobutamine drip and Coumadin. Co ox decreased to 67.3 INR decreased from 3.04 to  2.55. Has not been given Coumadin last several days. Will continue to hold in anticipation of RHC in am. 2. Pulmonary-acute on chronic respiratory failure, pulmonary hypertension. On 5 liters of oxygen via [place]. CPAP at night. Has bilateral pleural effusions and is s/p bilateral Pleur X placement 10/12. CXR this am appears stable (shows no pneumothorax, small bilateral pleural effusion.) Continue current nebs, inhalers. 3. Acute on chronic diastolic heart failure- On Zaroxolyn 5 mg bid and Lasix 160 mg IV tid. Dr. Shirlee LatchMclean following. May need right heart cath. 4. ABL anemia-H and H stable at 8.4 and 26.1. Continue Niferex and Foltx. 5.ID-on Vanco and Fortaz for HCAP (day #4) 6. Deconditioning-continue with PT 7. Poor oral intake,intermittent nausea-combination of right heart failure and CKD. Continue feeding supplements and encourage po. Monitor as may need feeding tube. 8. Acute on chronic CKD-creatinine 2.31  ZIMMERMAN,DONIELLE M PA-C  07/22/2016 8:30 AM    I have seen and examined the patient and agree with the assessment and plan as outlined.  Stable but no significant improvement.  Prognosis is relatively poor.  Agree w/ plans outlined by Dr. Shirlee LatchMcLean.  Await findings from right heart cath.    Purcell Nailslarence H Zorina Mallin, MD 07/22/2016

## 2016-07-22 NOTE — Progress Notes (Signed)
Physical Therapy Treatment Patient Details Name: Ann Potter MRN: [id number] DOB: Jul 08, 1941 Today's Date: 07/22/2016    History of Present Illness Patient is a 75 yo female admitted 06/04/2016 due to severe mitral valve stenosis and tricuspid valve regurgitation, now s/p MVR, TVR, and Maze procedure same date, bronchoscopy 10/7 and 10/9, intubation 10/10-10/16 with pleurex placed.   PMH:  morbid obesity, HOH, poor eyesight, mitral stenosis, tricuspid regurg, rheumatic heart disease, anxiety, Afib, CAD, MI, CHF, venous insufficiency, chronic LE edema, HTN, CKD, asthma, chronic back pain    PT Comments    Pt agreeable to attempt mobility, but clearly seems fatigued from sitting in chair this am.  Attempted to come to stand with pt unable to fully clear hips from chair despite 2 person A.  Maxi sky utilized for transfer back to bed today.  Continue to feel pt will need SNF level of care at D/C.    Follow Up Recommendations  SNF     Equipment Recommendations  None recommended by PT    Recommendations for Other Services       Precautions / Restrictions Precautions Precautions: Sternal;Fall Precaution Comments: Reviewed sternal precautions Restrictions Weight Bearing Restrictions: No    Mobility  Bed Mobility Overal bed mobility: Needs Assistance;+2 for physical assistance Bed Mobility: Rolling Rolling: Max assist         General bed mobility comments: pt does utilize bed rails to A with rolling self to either side, but needs MaxA to complete rolling.    Transfers Overall transfer level: Needs assistance   Transfers: Sit to/from Stand Sit to Stand: Total assist;+2 physical assistance         General transfer comment: Attempted to stand from recliner and pt with very limited ability to bear weight and clear hips from seat despite 2 person A.  pt clearly fatigued from sitting in chair this morning.  Maxi sky then used for returning pt to bed.    Ambulation/Gait                  Stairs            Wheelchair Mobility    Modified Rankin (Stroke Patients Only)       Balance Overall balance assessment: Needs assistance Sitting-balance support: Bilateral upper extremity supported;Feet supported;Single extremity supported Sitting balance-Leahy Scale: Poor     Standing balance support: During functional activity Standing balance-Leahy Scale: Zero                      Cognition Arousal/Alertness: Awake/alert Behavior During Therapy: WFL for tasks assessed/performed Overall Cognitive Status: Within Functional Limits for tasks assessed                      Exercises      General Comments        Pertinent Vitals/Pain Pain Assessment: Faces Faces Pain Scale: Hurts little more Pain Location: L LE Pain Descriptors / Indicators: Grimacing;Guarding;Tightness Pain Intervention(s): Monitored during session;Premedicated before session;Repositioned    Home Living                      Prior Function            PT Goals (current goals can now be found in the care plan section) Acute Rehab PT Goals Patient Stated Goal: To be left alone. PT Goal Formulation: With patient Time For Goal Achievement: 07/28/16 Potential to Achieve Goals: Fair Progress towards PT goals: Not progressing  toward goals - comment (Increased fatigue)    Frequency    Min 3X/week      PT Plan Current plan remains appropriate    Co-evaluation             End of Session Equipment Utilized During Treatment: Gait belt;Oxygen Activity Tolerance: Patient limited by fatigue Patient left: in bed;with call bell/phone within reach;with nursing/sitter in room     Time: 1120-1155 PT Time Calculation (min) (ACUTE ONLY): 35 min  Charges:  $Therapeutic Activity: 23-37 mins                    G CodesSunny Schlein, [place] [phone] 07/22/2016, 2:30 PM

## 2016-07-22 NOTE — Progress Notes (Signed)
Speech Language Pathology Treatment: Dysphagia  Patient Details Name: Ann PoeGloria Potter MRN: [id number] DOB: [birth year] Today's Date: 07/22/2016 Time: [id number] SLP Time Calculation (min) (ACUTE ONLY): 12 min  Assessment / Plan / Recommendation Clinical Impression  Pt upright in chair with strong vocal quality. Dentures were placed and regular solids, as well as thin liquids were consumed. She had mildly prolonged mastication of regular solids. Pt continues to have a poor appetite, but is not showing difficulty with swallowing at bedside. Recommend regular diet and thin liquids to allow for more desirable food selection. No further ST is necessary at this time.    HPI HPI: Patient is a 75 yo female admitted 06-21-16 due to severe mitral valve stenosis and tricuspid valve regurgitation, now s/p MVR, TVR, and Maze procedure same date, bronchoscopy 10/7 and 10/9, intubation 10/10-10/16 with pleurex placed.   PMH:  morbid obesity, HOH, poor eyesight, mitral stenosis, tricuspid regurg, rheumatic heart disease, anxiety, Afib, CAD, MI, CHF, venous insufficiency, chronic LE edema, HTN, CKD, asthma, chronic back pain      SLP Plan  Continue with current plan of care     Recommendations  Diet recommendations: Regular;Thin liquid Liquids provided via: Cup;Straw Medication Administration: Whole meds with puree Supervision: Patient able to self feed;Intermittent supervision to cue for compensatory strategies Compensations: Minimize environmental distractions;Slow rate;Small sips/bites Postural Changes and/or Swallow Maneuvers: Seated upright 90 degrees;Upright 30-60 min after meal                Oral Care Recommendations: Oral care BID Follow up Recommendations: Skilled Nursing facility Plan: Continue with current plan of care       GO               Ann EthHaleigh Ragan Ka Potter, Student SLP  Ann NeverHaleigh R Hovanes Potter 07/22/2016, 10:25 AM

## 2016-07-22 NOTE — Plan of Care (Signed)
Dr. Shirlee LatchMcLean called re: Bp in the 80's and low urine output, orders received

## 2016-07-22 NOTE — Progress Notes (Signed)
Patient ID: Ann Potter, female   DOB: Jul 10, 1941, 75 y.o.   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 TEE on 9/28 EF 55-60%. Post-operative course complicated by respiratory failure, AKI, hypotension, and marked volume overload. Extubated but required reintubation 10/10. Later extubated 10/19.  She has had 3 bronchoscopies --> 10/7, 10/9, 10/12. Pleurex catheters placed  10/12 on R and L.   Echo LVEF 55-60%, moderate posterior pericardial effusion. MVR/TV repair ok. RV mildly dilated and mildly reduced systolic function.   RVSP  CT chest: Possible RUL PNA, small hemorrhagic pericardial effusion.   On dobutamine 5. Coox 67%. CVP 12-13.  Transitioned to high dose Lasix boluses yesterday + metolazone.  Not much improvement in UOP, renal function stable.   Not very mobile, requires a lot of assistance to get out of bed.  Legs tender.   Creatinine 2.36 => 2.25 => 2.30 => 2.29 => 2.31  Objective:   Weight Range:  Vital Signs:   Temp:  [97.4 F (36.3 C)-98.5 F (36.9 C)] 97.4 F (36.3 C) (10/25 0400) Pulse Rate:  [67-81] 80 (10/25 0600) Resp:  [13-26] 24 (10/25 0600) BP: (91-192)/(44-171) 192/171 (10/25 0600) SpO2:  [87 %-100 %] 95 % (10/25 0746) FiO2 (%):  [30 %] 30 % (10/24 2153) Weight:  [253 lb 12 oz (115.1 kg)] 253 lb 12 oz (115.1 kg) (10/25 0500) Last BM Date: 07/21/16  Weight change: Filed Weights   07/20/16 0451 07/21/16 0600 07/22/16 0500  Weight: 253 lb 4.9 oz (114.9 kg) 255 lb 8.2 oz (115.9 kg) 253 lb 12 oz (115.1 kg)    Intake/Output:   Intake/Output Summary (Last 24 hours) at 07/22/16 0849 Last data filed at 07/22/16 0717  Gross per 24 hour  Intake           1565.4 ml  Output             1345 ml    Net            220.4 ml     Physical Exam: CVP 12-13 General: Chronically ill appearing. Obese. Lying in bed.  HEENT: normal Neck: Thick. JVP difficult to assess due to size. Carotids 2+ bilat; no bruits. No lymphadenopathy or thyromegaly noted Cor: PMI nondisplaced. Irregular rate & rhythm. No M/G/R. Sternal incision approximated.  Lungs: Decreased anterior breath sounds.  Abdomen: obese, soft, NT, ND, no HSM. No bruits or masses. +BS  Extremities: no cyanosis, clubbing, rash. 1-2+ edema to knees. Neuro: alert & orientedx3, cranial nerves grossly intact. moves all 4 extremities w/o difficulty. Affect pleasant GU: Foley  Telemetry: Reviewed, AF 70s   Labs: Basic Metabolic Panel:  Recent Labs Lab 07/19/16 0430 07/20/16 0540 07/20/16 1620 07/21/16 0513 07/22/16 0417  NA 135 135 133* 132* 134*  K 4.1 2.8* 3.5 3.6 4.0  CL 96* 96* 95* 93* 96*  CO2 26 27 27 28 27   GLUCOSE 118* 120* 134* 114* 106*  BUN 66* 71* 74* 75* 85*  CREATININE 2.36* 2.25* 2.30* 2.29* 2.31*  CALCIUM 9.3 9.0 9.1 9.0 9.4  MG  --  1.7  --  2.6*  --     Liver Function Tests:  Recent Labs Lab 07/16/16 0452 07/19/16 0430  07/20/16 0540  AST 26 23 23   ALT 18 15 16   ALKPHOS 113 99 92  BILITOT 1.5* 2.0* 1.8*  PROT 5.7* 5.8* 5.6*  ALBUMIN 2.6* 3.3* 3.1*   No results for input(s): LIPASE, AMYLASE in the last 168 hours. No results for input(s): AMMONIA in the last 168 hours.  CBC:  Recent Labs Lab 07/17/16 0500 07/18/16 0343 07/19/16 0430 07/21/16 0513 07/22/16 0417  WBC 8.5 7.5 8.0 6.9 9.2  HGB 8.9* 8.3* 8.5* 8.2* 8.4*  HCT 28.7* 26.4* 26.8* 25.8* 26.1*  MCV 97.3 96.4 96.1 95.9 95.3  PLT 129* 126* 175 186 207    Cardiac Enzymes: No results for input(s): CKTOTAL, CKMB, CKMBINDEX, TROPONINI in the last 168 hours.  BNP: BNP (last 3 results) No results for input(s): BNP in the last 8760 hours.  ProBNP (last 3 results) No results for input(s): PROBNP in the last 8760 hours.    Other  results:  Imaging: Dg Chest Port 1 View  Result Date: 07/22/2016 CLINICAL DATA:  Atelectasis, pneumonia. EXAM: PORTABLE CHEST 1 VIEW COMPARISON:  07/21/2016 and CT chest 07/18/2016. FINDINGS: Trachea is midline. Cardiomediastinal silhouette is enlarged, stable. Right PICC tip projects over the SVC. Bilateral chest tube are in place. Mild to moderate mixed interstitial and airspace opacification is seen bilaterally. Small bilateral effusions. No pneumothorax. Overall appearance of the chest stable from yesterday. IMPRESSION: 1. Mild to moderate mixed interstitial and airspace opacification in the lungs is unchanged and may be due to edema or pneumonia. 2. No definite pneumothorax with bilateral chest tubes in place. 3. Small bilateral pleural effusions. Electronically Signed   By: Leanna Battles M.D.   On: 07/22/2016 08:41   Dg Chest Port 1 View  Result Date: 07/21/2016 CLINICAL DATA:  Shortness of breath. EXAM: PORTABLE CHEST 1 VIEW COMPARISON:  Radiograph of July 19, 2016. FINDINGS: Stable cardiomegaly. Status post cardiac valve replacement. Right-sided PICC line is unchanged in position. Bilateral pleural drainage catheters again noted and unchanged. Stable diffuse bilateral lung opacities are noted concerning for edema or inflammation. Stable probable mild bilateral pleural effusions are noted. Bony thorax is unremarkable. IMPRESSION: Stable support apparatus. Stable bilateral lung opacities and small pleural effusions as described above. Electronically Signed   By: Lupita Raider, M.D.   On: 07/21/2016 08:59     Medications:     Scheduled Medications: . acetaZOLAMIDE  250 mg Oral Once  . alteplase  2 mg Intracatheter Once  . bisacodyl  10 mg Oral Daily   Or  . bisacodyl  10 mg Rectal Daily  . cefTAZidime (FORTAZ)  IV  2 g Intravenous Q24H  . docusate sodium  200 mg Oral BID  . feeding supplement  1 Container Oral TID BM  . feeding supplement (PRO-STAT SUGAR FREE 64)  30 mL Oral  BID  . folic acid-pyridoxine-cyancobalamin  1 tablet Oral Daily  . furosemide  160 mg Intravenous TID  . insulin aspart  0-15 Units Subcutaneous Q4H  . ipratropium  0.5 mg Nebulization TID  . iron polysaccharides  150 mg Oral Daily  . levalbuterol  1.25 mg Nebulization TID  . levothyroxine  50 mcg Oral QAC breakfast  . mouth rinse  15 mL Mouth Rinse Q12H  . metolazone  10 mg Oral BID  . metolazone  5 mg Oral Once  . nystatin ointment   Topical BID  . sodium chloride flush  10-40 mL Intracatheter Q12H  . sodium chloride flush  3 mL Intravenous Q12H  . vancomycin  1,250 mg Intravenous Q48H    Infusions: . sodium chloride 30 mL/hr at 07/22/16 0600  . DOBUTamine 5 mcg/kg/min (07/22/16 0729)  . norepinephrine (LEVOPHED) Adult infusion Stopped (07/19/16 2015)    PRN Medications: Place/Maintain arterial line **AND** sodium chloride, acetaminophen, Gerhardt's butt cream, ipratropium-albuterol, ondansetron (ZOFRAN) IV, RESOURCE THICKENUP CLEAR, sodium chloride flush, sodium chloride flush   Assessment/Plan:    1. Hypotension:  Echo showed LV EF normal.  RV function is reduced but not markedly so.  Some concern for effusive-constrictive pericarditis based on septal bounce but mitral inflow respirometry is not consistent with this and effusion is relatively small.  Suspect hypotension has led to ATN on top of baseline PAH. Now resolved.  - BP now stable.  - Blood cultures NGTD 2. 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  Co-ox 67%.  UOP poor despite high dose diuretics.  Creatinine stable. Currently on dobutamine for RV support.  - Continue Lasix 160 mg IV tid and will increase metolazone to 10 bid + a dose of acetazolamide today. . - Will need RHC, PASP on echo only 31 mmHg but suspect PA pressure is probably higher with longstanding MV disease. May benefit from switching dobutamine to milrinone => given lack of progress with dobutamine,  will stop and start milrinone 0.25. INR 2.55 today, will let trend down to closer to 2 and plan RHC tomorrow.  3. AKI: Suspect ATN in setting of hypotension.  - Relatively stable, though remains elevated.   4. Pericardial effusion: Moderate posterior organized pericardial effusion, concern for effusive-constrictive pericarditis but MV inflow respirometry not suggestive (though not sure that MV inflow respirometry is as accurate with bioprosthetic MV).  Effusion small by chest CT.  5. S/p bioprosthetic MV: Valve appears to function normally on echo.  6. Atrial fibrillation: Persistent. Back in AF s/p Maze.  On warfarin. INR 2.55.  Awaiting down-trend for RHC.   - Would like to get INR close to 2.0 range for RHC. - Consider DCCV once diuresed and off dobutamine.  7. Pleural effusions bilaterally: Pleurx catheters per TCTS 8. ID: Possible right upper lobe PNA by CT chest.  Empiric vanco/ceftazidime.   Length of Stay: [address]27  Marca AnconaDalton Kassie Keng 07/22/2016, 8:49 AM  Advanced Heart Failure Team Pager [phone] (M-F; 7a - 4p)  Please contact [facility] for night-coverage after hours (4p -7a ) and weekends on [email or web address]

## 2016-07-22 NOTE — Progress Notes (Signed)
Patient ID: Ann PoeGloria Potter, female   DOB: 1940-11-12, 75 y.o.   MRN: [id number] EVENING ROUNDS NOTE :     [address]       [place] [id number]             [phone]                 13 Days Post-Op Procedure(s) (LRB): INSERTION PLEURAL DRAINAGE CATHETER (Bilateral) VIDEO BRONCHOSCOPY (N/A)  Total Length of Stay:  LOS: 27 days  BP (!) 104/51   Pulse 77   Temp 97.3 F (36.3 C) (Oral)   Resp 20   Ht 5\' 1"  (1.549 m)   Wt 253 lb 12 oz (115.1 kg)   SpO2 97%   BMI 47.95 kg/m   .Intake/Output      10/25 0701 - 10/26 0700   P.O. 390   I.V. (mL/kg) 337.3 (2.9)   IV Piggyback 150   Total Intake(mL/kg) 877.3 (7.6)   Urine (mL/kg/hr) 320 (0.2)   Other 350 (0.2)   Stool 1 (0)   Total Output 671   Net +206.3         . [START ON 09-16-16] sodium chloride    . [START ON 09-16-16] sodium chloride 30 mL/hr at 07/22/16 1238  . sodium chloride 30 mL/hr at 07/22/16 1900  . DOBUTamine 5 mcg/kg/min (07/22/16 1900)  . norepinephrine (LEVOPHED) Adult infusion Stopped (07/19/16 2015)     Lab Results  Component Value Date   WBC 9.2 07/22/2016   HGB 8.4 (L) 07/22/2016   HCT 26.1 (L) 07/22/2016   PLT 207 07/22/2016   GLUCOSE 106 (H) 07/22/2016   TRIG 89 07/10/2016   ALT 16 07/20/2016   AST 23 07/20/2016   NA 134 (L) 07/22/2016   K 4.0 07/22/2016   CL 96 (L) 07/22/2016   CREATININE 2.31 (H) 07/22/2016   BUN 85 (H) 07/22/2016   CO2 27 07/22/2016   INR 2.55 07/22/2016   HGBA1C 5.3 06/22/2016   Poss better on dobutamine   needs nutrition Heart cath tomorrow  Delight OvensEdward B Cecil Bixby MD  Beeper [phone] Office [phone] 07/22/2016 8:51 PM

## 2016-07-23 ENCOUNTER — Inpatient Hospital Stay (HOSPITAL_COMMUNITY): Payer: Medicare Other

## 2016-07-23 ENCOUNTER — Encounter (HOSPITAL_COMMUNITY): Payer: Self-pay | Admitting: Cardiology

## 2016-07-23 ENCOUNTER — Ambulatory Visit: Payer: Medicare Other | Admitting: Cardiology

## 2016-07-23 ENCOUNTER — Encounter (HOSPITAL_COMMUNITY)
Admission: RE | Disposition: E | Payer: Self-pay | Source: Ambulatory Visit | Attending: Thoracic Surgery (Cardiothoracic Vascular Surgery)

## 2016-07-23 DIAGNOSIS — I509 Heart failure, unspecified: Secondary | ICD-10-CM

## 2016-07-23 HISTORY — PX: CARDIAC CATHETERIZATION: SHX172

## 2016-07-23 LAB — BASIC METABOLIC PANEL
ANION GAP: 12 (ref 5–15)
BUN: 95 mg/dL — AB (ref 6–20)
CALCIUM: 9.4 mg/dL (ref 8.9–10.3)
CO2: 27 mmol/L (ref 22–32)
CREATININE: 2.44 mg/dL — AB (ref 0.44–1.00)
Chloride: 96 mmol/L — ABNORMAL LOW (ref 101–111)
GFR calc Af Amer: 21 mL/min — ABNORMAL LOW (ref >60)
GFR, EST NON AFRICAN AMERICAN: 18 mL/min — AB (ref >60)
GLUCOSE: 114 mg/dL — AB (ref 65–99)
Potassium: 4.2 mmol/L (ref 3.5–5.1)
Sodium: 135 mmol/L (ref 135–145)

## 2016-07-23 LAB — GLUCOSE, CAPILLARY
GLUCOSE-CAPILLARY: 108 mg/dL — AB (ref 65–99)
GLUCOSE-CAPILLARY: 118 mg/dL — AB (ref 65–99)
Glucose-Capillary: 115 mg/dL — ABNORMAL HIGH (ref 65–99)
Glucose-Capillary: 122 mg/dL — ABNORMAL HIGH (ref 65–99)
Glucose-Capillary: 114 mg/dL — ABNORMAL HIGH (ref 65–99)
Glucose-Capillary: 131 mg/dL — ABNORMAL HIGH (ref 65–99)

## 2016-07-23 LAB — POCT I-STAT 3, VENOUS BLOOD GAS (G3P V)
ACID-BASE EXCESS: 1 mmol/L (ref 0.0–2.0)
Acid-Base Excess: 1 mmol/L (ref 0.0–2.0)
Bicarbonate: 27.3 mmol/L (ref 20.0–28.0)
Bicarbonate: 27.9 mmol/L (ref 20.0–28.0)
O2 SAT: 64 %
O2 Saturation: 63 %
PCO2 VEN: 53.9 mmHg (ref 44.0–60.0)
PH VEN: 7.323 (ref 7.250–7.430)
PH VEN: 7.33 (ref 7.250–7.430)
PO2 VEN: 36 mmHg (ref 32.0–45.0)
TCO2: 29 mmol/L (ref 0–100)
TCO2: 30 mmol/L (ref 0–100)
pCO2, Ven: 51.9 mmHg (ref 44.0–60.0)
pO2, Ven: 36 mmHg (ref 32.0–45.0)

## 2016-07-23 LAB — CBC
HEMATOCRIT: 25.4 % — AB (ref 36.0–46.0)
Hemoglobin: 7.9 g/dL — ABNORMAL LOW (ref 12.0–15.0)
MCH: 29.9 pg (ref 26.0–34.0)
MCHC: 31.1 g/dL (ref 30.0–36.0)
MCV: 96.2 fL (ref 78.0–100.0)
PLATELETS: 212 10*3/uL (ref 150–400)
RBC: 2.64 MIL/uL — ABNORMAL LOW (ref 3.87–5.11)
RDW: 18.2 % — AB (ref 11.5–15.5)
WBC: 9.5 10*3/uL (ref 4.0–10.5)

## 2016-07-23 LAB — COOXEMETRY PANEL
Carboxyhemoglobin: 1.7 % — ABNORMAL HIGH (ref 0.5–1.5)
Methemoglobin: 0.8 % (ref 0.0–1.5)
O2 SAT: 79.9 %
TOTAL HEMOGLOBIN: 15 g/dL (ref 12.0–16.0)

## 2016-07-23 LAB — MAGNESIUM: Magnesium: 2.5 mg/dL — ABNORMAL HIGH (ref 1.7–2.4)

## 2016-07-23 LAB — URINALYSIS, ROUTINE W REFLEX MICROSCOPIC
BILIRUBIN URINE: NEGATIVE
Glucose, UA: NEGATIVE mg/dL
Ketones, ur: NEGATIVE mg/dL
NITRITE: NEGATIVE
Protein, ur: 30 mg/dL — AB
SPECIFIC GRAVITY, URINE: 1.013 (ref 1.005–1.030)
pH: 5.5 (ref 5.0–8.0)

## 2016-07-23 LAB — URINE MICROSCOPIC-ADD ON

## 2016-07-23 LAB — PHOSPHORUS
PHOSPHORUS: 4.6 mg/dL (ref 2.5–4.6)
Phosphorus: 4.2 mg/dL (ref 2.5–4.6)

## 2016-07-23 LAB — PROTIME-INR
INR: 2.17
Prothrombin Time: 24.5 seconds — ABNORMAL HIGH (ref 11.4–15.2)

## 2016-07-23 LAB — CREATININE, URINE, RANDOM: CREATININE, URINE: 59.4 mg/dL

## 2016-07-23 LAB — SODIUM, URINE, RANDOM

## 2016-07-23 SURGERY — RIGHT HEART CATH

## 2016-07-23 MED ORDER — ONDANSETRON HCL 4 MG/2ML IJ SOLN: 4.0000 mg | Freq: Four times a day (QID) | INTRAMUSCULAR | Status: DC | PRN

## 2016-07-23 MED ORDER — MIDAZOLAM HCL 2 MG/2ML IJ SOLN
INTRAMUSCULAR | Status: AC
  Filled 2016-07-23: qty 2

## 2016-07-23 MED ORDER — SODIUM CHLORIDE 0.9 % IV SOLN
INTRAVENOUS | Status: DC | PRN
  Administered 2016-07-23: 10 mL/h via INTRAVENOUS

## 2016-07-23 MED ORDER — MIDAZOLAM HCL 2 MG/2ML IJ SOLN
INTRAMUSCULAR | Status: DC | PRN
  Administered 2016-07-23: 0.5 mg via INTRAVENOUS

## 2016-07-23 MED ORDER — LIDOCAINE HCL (PF) 1 % IJ SOLN
INTRAMUSCULAR | Status: AC
Start: 2016-07-23 — End: 2016-07-23
  Filled 2016-07-23: qty 30

## 2016-07-23 MED ORDER — LIDOCAINE HCL (PF) 1 % IJ SOLN
INTRAMUSCULAR | Status: AC
  Filled 2016-07-23: qty 30

## 2016-07-23 MED ORDER — SODIUM CHLORIDE 0.9 % IV SOLN: 250.0000 mL | INTRAVENOUS | Status: DC | PRN

## 2016-07-23 MED ORDER — SODIUM CHLORIDE 0.9% FLUSH: 3.0000 mL | Freq: Two times a day (BID) | INTRAVENOUS | Status: DC

## 2016-07-23 MED ORDER — VITAL HIGH PROTEIN PO LIQD: 1000.0000 mL | ORAL | Status: DC

## 2016-07-23 MED ORDER — ACETAMINOPHEN 325 MG PO TABS: 650.0000 mg | ORAL_TABLET | ORAL | Status: DC | PRN

## 2016-07-23 MED ORDER — PRO-STAT SUGAR FREE PO LIQD: 30.0000 mL | Freq: Two times a day (BID) | ORAL | Status: DC

## 2016-07-23 MED ORDER — SODIUM CHLORIDE 0.9% FLUSH: 3.0000 mL | INTRAVENOUS | Status: DC | PRN

## 2016-07-23 MED ORDER — LIDOCAINE HCL (PF) 1 % IJ SOLN
INTRAMUSCULAR | Status: DC | PRN
  Administered 2016-07-23: 15 mL

## 2016-07-23 MED ORDER — FENTANYL CITRATE (PF) 100 MCG/2ML IJ SOLN
INTRAMUSCULAR | Status: AC
  Filled 2016-07-23: qty 2

## 2016-07-23 MED ORDER — FENTANYL CITRATE (PF) 100 MCG/2ML IJ SOLN
INTRAMUSCULAR | Status: DC | PRN
  Administered 2016-07-23: 25 ug via INTRAVENOUS

## 2016-07-23 MED ORDER — HEPARIN (PORCINE) IN NACL 2-0.9 UNIT/ML-% IJ SOLN
INTRAMUSCULAR | Status: DC | PRN
  Administered 2016-07-23: 500 mL

## 2016-07-23 MED ORDER — HEPARIN (PORCINE) IN NACL 2-0.9 UNIT/ML-% IJ SOLN
INTRAMUSCULAR | Status: AC
  Filled 2016-07-23: qty 500

## 2016-07-23 SURGICAL SUPPLY — 9 items
CATH SWAN GANZ 7F STRAIGHT (CATHETERS) ×3 IMPLANT
KIT HEART LEFT (KITS) ×3 IMPLANT
PACK CARDIAC CATHETERIZATION (CUSTOM PROCEDURE TRAY) ×3 IMPLANT
SHEATH PINNACLE 7F 10CM (SHEATH) ×3 IMPLANT
TRANSDUCER W/STOPCOCK (MISCELLANEOUS) ×3 IMPLANT
TUBING ART PRESS 72  MALE/FEM (TUBING) ×2
TUBING ART PRESS 72 MALE/FEM (TUBING) ×1 IMPLANT
TUBING CIL FLEX 10 FLL-RA (TUBING) ×3 IMPLANT
WIRE EMERALD 3MM-J .025X260CM (WIRE) ×3 IMPLANT

## 2016-07-23 NOTE — Interval H&P Note (Signed)
History and Physical Interval Note:  07/24/2016 8:00 AM  Ann Potter  has presented today for surgery, with the diagnosis of hf  The various methods of treatment have been discussed with the patient and family. After consideration of risks, benefits and other options for treatment, the patient has consented to  Procedure(s): Right Heart Cath (N/A) as a surgical intervention .  The patient's history has been reviewed, patient examined, no change in status, stable for surgery.  I have reviewed the patient's chart and labs.  Questions were answered to the patient's satisfaction.     Maya Scholer [employer]

## 2016-07-23 NOTE — Progress Notes (Signed)
Patient ID: Ann Potter, female   DOB: 1941/01/09, 75 y.o.   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 TEE on 9/28 EF 55-60%. Post-operative course complicated by respiratory failure, AKI, hypotension, and marked volume overload. Extubated but required reintubation 10/10. Later extubated 10/19.  She has had 3 bronchoscopies --> 10/7, 10/9, 10/12. Pleurex catheters placed  10/12 on R and L.   Echo LVEF 55-60%, moderate posterior pericardial effusion. MVR/TV repair ok. RV mildly dilated and mildly reduced systolic function.   RVSP 31mmHG  CT chest: Possible RUL PNA, small hemorrhagic pericardial effusion.   On dobutamine 5 (attempted transition to milrinone on 10/25 but became hypotensive, switched back to dobutamine).  Coox 80%. RHC see below.  Got high dose Lasix + metolazone + acetazolamide yesterday with minimal response.  BUN up to 95 with creatinine 2.44.   Not very mobile, requires a lot of assistance to get out of bed.  Legs tender.   Creatinine 2.36 => 2.25 => 2.30 => 2.29 => 2.31 => 2.44  RHC Procedural Findings (10/26): Hemodynamics (mmHg) RA mean 24 RV 80/16, mean 29 PA 83/32, mean 54 PCWP mean 35 Oxygen saturations: PA 64% AO 99% Cardiac Output (Fick) 7.39  Cardiac Index (Fick) 3.54 PVR 2.6 WU Cardiac Output (Thermo) 7.08 Cardiac Index (Thermo) 3.39  Objective:   Weight Range:  Vital Signs:   Temp:  [97.2 F (36.2 C)-98.7 F (37.1 C)] 97.2 F (36.2 C) (10/26 0932) Pulse Rate:  [64-82] 75 (10/26 1000) Resp:  [14-35] 26 (10/26 1000) BP: (80-144)/(23-108) 110/55 (10/26 1000) SpO2:  [89 %-100 %] 98 % (10/26 1000) Weight:  [255 lb 8.2 oz (115.9 kg)] 255 lb 8.2 oz (115.9 kg) (10/26 0600) Last BM Date:  07/22/16  Weight change: Filed Weights   07/21/16 0600 07/22/16 0500 07/06/2016 0600  Weight: 255 lb 8.2 oz (115.9 kg) 253 lb 12 oz (115.1 kg) 255 lb 8.2 oz (115.9 kg)    Intake/Output:   Intake/Output Summary (Last 24 hours) at 07/28/2016 1131 Last data filed at 07/10/2016 1100  Gross per 24 hour  Intake          1191.31 ml  Output              960 ml  Net           231.31 ml     Physical Exam: General: Chronically ill appearing. Obese. Lying in bed.  HEENT: normal Neck: Thick. JVP difficult to assess due to size, appears elevated. Carotids 2+ bilat; no bruits. No lymphadenopathy or thyromegaly noted Cor: PMI nondisplaced. Irregular rate & rhythm. No M/G/R. Sternal incision approximated.  Lungs: Decreased anterior breath sounds.  Abdomen: obese, soft, NT, ND, no HSM. No bruits or masses. +BS  Extremities: no cyanosis, clubbing, rash. 1-2+ edema to knees. Neuro: alert & orientedx3, cranial nerves grossly intact. moves all 4 extremities w/o difficulty. Affect pleasant GU: Foley  Telemetry: Reviewed, AF 70s   Labs: Basic Metabolic Panel:  Recent Labs Lab 07/20/16 0540 07/20/16 1620 07/21/16 0513 07/22/16 0417 07/24/2016 0359  NA 135 133* 132* 134* 135  K 2.8* 3.5 3.6 4.0 4.2  CL 96* 95* 93* 96* 96*  CO2 27 27 28 27  27  GLUCOSE 120* 134* 114* 106* 114*  BUN 71* 74* 75* 85* 95*  CREATININE 2.25* 2.30* 2.29* 2.31* 2.44*  CALCIUM 9.0 9.1 9.0 9.4 9.4  MG 1.7  --  2.6*  --   --     Liver Function Tests:  Recent Labs Lab 07/19/16 0430 07/20/16 0540  AST 23 23  ALT 15 16  ALKPHOS 99 92  BILITOT 2.0* 1.8*  PROT 5.8* 5.6*  ALBUMIN 3.3* 3.1*   No results for input(s): LIPASE, AMYLASE in the last 168 hours. No results for input(s): AMMONIA in the last 168 hours.  CBC:  Recent Labs Lab 07/18/16 0343 07/19/16 0430 07/21/16 0513 07/22/16 0417 07/12/2016 0359  WBC 7.5 8.0 6.9 9.2 9.5  HGB 8.3* 8.5* 8.2* 8.4* 7.9*  HCT 26.4* 26.8* 25.8* 26.1* 25.4*  MCV 96.4 96.1  95.9 95.3 96.2  PLT 126* 175 186 207 212    Cardiac Enzymes: No results for input(s): CKTOTAL, CKMB, CKMBINDEX, TROPONINI in the last 168 hours.  BNP: BNP (last 3 results) No results for input(s): BNP in the last 8760 hours.  ProBNP (last 3 results) No results for input(s): PROBNP in the last 8760 hours.    Other results:  Imaging: Dg Chest Port 1 View  Result Date: 07/22/2016 CLINICAL DATA:  Atelectasis, pneumonia. EXAM: PORTABLE CHEST 1 VIEW COMPARISON:  07/21/2016 and CT chest 07/18/2016. FINDINGS: Trachea is midline. Cardiomediastinal silhouette is enlarged, stable. Right PICC tip projects over the SVC. Bilateral chest tube are in place. Mild to moderate mixed interstitial and airspace opacification is seen bilaterally. Small bilateral effusions. No pneumothorax. Overall appearance of the chest stable from yesterday. IMPRESSION: 1. Mild to moderate mixed interstitial and airspace opacification in the lungs is unchanged and may be due to edema or pneumonia. 2. No definite pneumothorax with bilateral chest tubes in place. 3. Small bilateral pleural effusions. Electronically Signed   By: Leanna Battles M.D.   On: 07/22/2016 08:41     Medications:     Scheduled Medications: . alteplase  2 mg Intracatheter Once  . bisacodyl  10 mg Oral Daily   Or  . bisacodyl  10 mg Rectal Daily  . cefTAZidime (FORTAZ)  IV  2 g Intravenous Q24H  . docusate sodium  200 mg Oral BID  . feeding supplement  1 Container Oral TID BM  . feeding supplement (PRO-STAT SUGAR FREE 64)  30 mL Oral BID  . folic acid-pyridoxine-cyancobalamin  1 tablet Oral Daily  . furosemide  160 mg Intravenous TID  . insulin aspart  0-15 Units Subcutaneous Q4H  . ipratropium  0.5 mg Nebulization TID  . iron polysaccharides  150 mg Oral Daily  . levalbuterol  1.25 mg Nebulization TID  . levothyroxine  50 mcg Oral QAC breakfast  . mouth rinse  15 mL Mouth Rinse Q12H  . metolazone  10 mg Oral BID  . nystatin ointment    Topical BID  . vancomycin  1,250 mg Intravenous Q48H    Infusions: . sodium chloride Stopped (07/15/2016 0400)  . DOBUTamine 3 mcg/kg/min (07/13/2016 1100)  . norepinephrine (LEVOPHED) Adult infusion Stopped (07/19/16 2015)    PRN Medications: Place/Maintain arterial line **AND** sodium chloride, acetaminophen, Gerhardt's butt cream, ipratropium-albuterol, ondansetron (ZOFRAN) IV, RESOURCE THICKENUP CLEAR, sodium chloride flush, sodium chloride flush   Assessment/Plan:    1. Hypotension:  Echo showed LV EF normal.  RV function is reduced but not markedly so.  Some concern for effusive-constrictive pericarditis based on septal bounce but mitral inflow respirometry is  not consistent with this and effusion is relatively small.  Suspect hypotension has led to ATN on top of baseline PAH. Now resolved on dobutamine (BP dropped yesterday when we tried to transition to milrinone).  - BP now stable.  - Blood cultures NGTD 2. 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  Preserved cardiac output on cath.  Co-ox 80%.  UOP remains poor despite high dose diuretics, BUN/creatinine rising.   - Continue dobutamine for RV support but can decrease dose to 3 mcg/kg/min.    - Continue current diuretics for now, but I think to effect any volume removal, we will need CVVH.  I have consulted renal and would like to start this today hopefully.  I think our only option is to give her a trial of CVVH and then hope for renal recovery once volume is off.  She would not be a good candidate for long-term HD.  3. AKI: Suspect initial ATN in setting of hypotension. BP now stable.  - BUN/creatinine higher today with attempted diuresis.  As above, renal consulted for CVVH.   4. Pericardial effusion: Moderate posterior organized pericardial effusion, concern for effusive-constrictive pericarditis but MV inflow respirometry not suggestive  (though not sure that MV inflow respirometry is as accurate with bioprosthetic MV).  Effusion small by chest CT.  5. S/p bioprosthetic MV: Valve appears to function normally on echo.  6. Atrial fibrillation: Persistent. Back in AF s/p Maze.  On warfarin.   - Could consider DCCV once diuresed and off dobutamine, but doubt it would have a large effect.   7. Pleural effusions bilaterally: Pleurx catheters per TCTS 8. ID: Possible right upper lobe PNA by CT chest.  Empiric vanco/ceftazidime.   Length of Stay: [address]  Marca Ancona 06/30/2016, 11:31 AM  Advanced Heart Failure Team Pager [phone] (M-F; 7a - 4p)  Please contact [facility] for night-coverage after hours (4p -7a ) and weekends on [email or web address]

## 2016-07-23 NOTE — Progress Notes (Signed)
OT Cancellation Note  Patient Details Name: Sherle PoeGloria Cheever MRN: [id number] DOB: [birth year]   Cancelled Treatment:    Reason Eval/Treat Not Completed: Medical issues which prohibited therapy.  Pt is hypotensive and with increased WOB.  Will reattempt.  Issaiah Seabrooks [place], OTR/L [phone]   Jeani HawkingConarpe, Myya Meenach M January 11, 2016, 2:45 PM

## 2016-07-23 NOTE — Consult Note (Signed)
Ann Potter is an 75 y.o. female referred by Dr Cornelius Moraswen   Chief Complaint: Acute on CKD 3 HPI: 75yo WF SP MVR/tricuspid valve repair/Maze procedure on 06/19/2016.  Baseline Scr in the mid 1's and over the last 10d has slowly increased to 2.44 as of today.  Had CT angio with IV contrast on 07/08/16 but renal fx remained stable for days post procedure as it wasn't until 07/15/16 that Scr started to rise.  She has had issues with off and on hypotension over last 2 weeks with SBP in the 70's at times.  UO which had been good has decreased in last several days with only 600cc recorded yest and only 180cc so far today.  Past Medical History:  Diagnosis Date  . Anxiety   . Arthritis    "feet & legs" (04/16/2016)  . Asthma   . Atrial fibrillation (HCC)   . Atrial fibrillation, chronic (HCC) 01/25/2012   Status post DCCV, June 2013  . CAD (coronary artery disease)    Nonobstructive, by catheterization 4/13  . CHF (congestive heart failure) (HCC)   . Chronic diastolic congestive heart failure (HCC) 01/25/2012  . Chronic lower back pain   . Chronic renal insufficiency, stage III (moderate) 01/25/2012  . Chronic venous insufficiency   . COPD (chronic obstructive pulmonary disease) (HCC)   . Heart murmur   . HLD (hyperlipidemia)   . HTN (hypertension)   . Hypothyroidism   . Lower extremity edema   . Macular degeneration   . Mitral stenosis    Mild, by catheterization 4/13  . Morbid obesity (HCC)   . Myocardial infarction 2007  . Pulmonary hypertension    Moderate, by catheterization 4/13  . Rheumatic fever during childhood    Age [age]  . Rheumatic heart disease   . S/P Maze operation for atrial fibrillation 05/31/2016   Complete bilateral atrial lesion set using cryothermy and bipolar radiofrequency ablation with clipping of LA appendage  . 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 Tricuspid regurgitation   . Worries     Past Surgical History:  Procedure Laterality Date  . CARDIAC CATHETERIZATION  12/2011; 04/16/2016  . CARDIAC CATHETERIZATION N/A 04/16/2016   Procedure: Right/Left Heart Cath and Coronary Angiography;  Surgeon: Tonny BollmanMichael Cooper, MD;  [facility];  Service: Cardiovascular;  Laterality: N/A;  . CARDIAC CATHETERIZATION N/A 07/16/2016   Procedure: Right Heart Cath;  Surgeon: Laurey Moralealton S McLean, MD;  [facility];  Service: Cardiovascular;  Laterality: N/A;  . CARDIOVERSION    . CATARACT EXTRACTION W/ INTRAOCULAR LENS  IMPLANT, BILATERAL Bilateral   . CHEST TUBE INSERTION Bilateral 2016/05/23   Procedure: INSERTION PLEURAL DRAINAGE CATHETER;  Surgeon: Purcell Nailslarence H Owen, MD;  Location: [facility] OR;  Service: Thoracic;  Laterality: Bilateral;  . MAZE N/A 06/09/2016   Procedure: MAZE;  Surgeon: Purcell Nailslarence H Owen, MD;  [facility];  Service: Open Heart Surgery;  Laterality: N/A;  . MITRAL VALVE REPLACEMENT N/A 06/27/2016   Procedure: MITRAL VALVE (MV) REPLACEMENT;  Surgeon: Purcell Nailslarence H Owen, MD;  Location: [facility] OR;  Service: Open Heart Surgery;  Laterality: N/A;  . MULTIPLE TOOTH EXTRACTIONS  1965  . TEE WITH CARDIOVERSION     3 year ago  . TEE WITHOUT CARDIOVERSION N/A 06/25/2015   Procedure: TRANSESOPHAGEAL ECHOCARDIOGRAM (TEE);  Surgeon: Christiane HaJonathan  Margy Clarks, MD;  Location: [facility];  Service: Cardiology;  Laterality: N/A;  moved to 8:30 - Terri calling pt & Bernie  . TEE WITHOUT CARDIOVERSION N/A 06/30/2016   Procedure: TRANSESOPHAGEAL ECHOCARDIOGRAM (TEE);  Surgeon: Purcell Nails, MD;  Location: [facility] OR;  Service: Open Heart Surgery;  Laterality: N/A;  . TRICUSPID VALVE REPLACEMENT N/A 06/30/16   Procedure: TRICUSPID VALVE REPAIR;  Surgeon: Purcell Nails, MD;  Location: [facility] OR;  Service: Open Heart Surgery;  Laterality: N/A;  . TUBAL LIGATION  1984  . VIDEO BRONCHOSCOPY N/A 07/17/2016    Procedure: VIDEO BRONCHOSCOPY/ INTUBATION/ REMOVAL OF SECRETIONS;  Surgeon: Delight Ovens, MD;  Location: [facility] OR;  Service: Thoracic;  Laterality: N/A;  . VIDEO BRONCHOSCOPY N/A 07/23/2016   Procedure: VIDEO BRONCHOSCOPY;  Surgeon: Purcell Nails, MD;  [facility] OR;  Service: Thoracic;  Laterality: N/A;    Family History  Problem Relation Age of Onset  . Heart disease Father 45  . Deep vein thrombosis Father   . Cancer Mother     right breast   . Cancer Sister     brain cancer  . Stomach cancer Brother    Social History:  reports that she has quit smoking. Her smoking use included Cigarettes. She started smoking about 56 years ago. She has a 11.00 pack-year smoking history. She has never used smokeless tobacco. She reports that she does not drink alcohol or use drugs.  Allergies:  Allergies  Allergen Reactions  . Relafen [Nabumetone] Other (See Comments)    Bladder pain    Facility-Administered Medications Prior to Admission  Medication Dose Route Frequency Provider Last Rate Last Dose  . glutaraldehyde 0.625% soaking solution   Topical PRN Purcell Nails, MD       Medications Prior to Admission  Medication Sig Dispense Refill  . acetaminophen (TYLENOL) 650 MG CR tablet Take 650 mg by mouth every 8 (eight) hours as needed for pain.    Marland Kitchen albuterol (PROVENTIL HFA;VENTOLIN HFA) 108 (90 BASE) MCG/ACT inhaler Inhale 2 puffs into the lungs every 6 (six) hours as needed. For shortness of breath    . diltiazem (CARDIZEM) 30 MG tablet Take 1 tablet (30 mg total) by mouth 2 (two) times daily. 180 tablet 3  . Fluticasone-Salmeterol (ADVAIR) 250-50 MCG/DOSE AEPB Inhale 1 puff into the lungs 2 (two) times daily.    Marland Kitchen levothyroxine (SYNTHROID, LEVOTHROID) 50 MCG tablet Take 50 mcg by mouth daily.    Marland Kitchen loratadine (CLARITIN) 10 MG tablet Take 10 mg by mouth as needed for allergies.     . metolazone (ZAROXOLYN) 2.5 MG tablet Take 1 tab every other week;may take weekly for additional  swelling as needed. (Patient taking differently: Take 2.5 mg by mouth See admin instructions. Take 2.5 mg by mouth every other week;may take weekly for additional swelling as needed.) 30 tablet 3  . montelukast (SINGULAIR) 10 MG tablet Take 10 mg by mouth daily.     . potassium chloride SA (K-DUR,KLOR-CON) 20 MEQ tablet Take 2 tablets (40 mEq total) by mouth 2 (two) times daily. 60 tablet 10  . rivaroxaban (XARELTO) 20 MG TABS tablet Take 1 tablet (20 mg total) by mouth daily with supper. 14 tablet 0  . rosuvastatin (CRESTOR) 10 MG tablet Take 10 mg by mouth daily.    Marland Kitchen torsemide (DEMADEX) 20 MG tablet Take 4 tablets (80 mg total) by mouth 2 (two) times daily. 240 tablet 6     Lab Results: UA: Benign  preop on 06/22/16  Recent Labs  07/21/16 0513 07/22/16 0417 06/30/2016 0359  WBC 6.9 9.2 9.5  HGB 8.2* 8.4* 7.9*  HCT 25.8* 26.1* 25.4*  PLT 186 207 212   BMET  Recent Labs  07/21/16 0513 07/22/16 0417 07/27/2016 0359  NA 132* 134* 135  K 3.6 4.0 4.2  CL 93* 96* 96*  CO2 28 27 27   GLUCOSE 114* 106* 114*  BUN 75* 85* 95*  CREATININE 2.29* 2.31* 2.44*  CALCIUM 9.0 9.4 9.4   LFT No results for input(s): PROT, ALBUMIN, AST, ALT, ALKPHOS, BILITOT, BILIDIR, IBILI in the last 72 hours. Dg Chest Port 1 View  Result Date: 07/22/2016 CLINICAL DATA:  Atelectasis, pneumonia. EXAM: PORTABLE CHEST 1 VIEW COMPARISON:  07/21/2016 and CT chest 07/18/2016. FINDINGS: Trachea is midline. Cardiomediastinal silhouette is enlarged, stable. Right PICC tip projects over the SVC. Bilateral chest tube are in place. Mild to moderate mixed interstitial and airspace opacification is seen bilaterally. Small bilateral effusions. No pneumothorax. Overall appearance of the chest stable from yesterday. IMPRESSION: 1. Mild to moderate mixed interstitial and airspace opacification in the lungs is unchanged and may be due to edema or pneumonia. 2. No definite pneumothorax with bilateral chest tubes in place. 3. Small  bilateral pleural effusions. Electronically Signed   By: Leanna Battles M.D.   On: 07/22/2016 08:41    ROS: Appetite poor No SOB CP secondary to surgery RUQ abd pain No dysuria  PHYSICAL EXAM: Blood pressure (!) 110/55, pulse 75, temperature 97.5 F (36.4 C), temperature source Oral, resp. rate (!) 26, height 5\' 1"  (1.549 m), weight 115.9 kg (255 lb 8.2 oz), SpO2 98 %. HEENT: PERRLA EOMI NECK:Mild JVD LUNGS:Decreased BS bases, bil crackles CARDIAC:irreg, irreg ABD:+ BS Soft, mild Rt and Lt upper quadrant abd tenderness, no rebound or guarding Chest  Bilat pleurex chest tubes EXT: 3+ edema. PICC line Rt arm NEURO:Awake and alert. Follow commands, Ox3, No asterixis  Assessment: 1. Acute on CKD 3 most likely hemodynamically mediated from hypotension +/- ATN.  UO decreasing 2. SP MVR/Tricuspid repair/Maze 3. A fib 4. Pericardial effusion PLAN: 1. Spoke with CCM about placing HD cath tomorrow as INR should be acceptable by then 2. Check renal US 3. Check UA 4. Spoke with son about role of CVVHD and that it can be stopped at anytime if not helping.  He understands and pt and he are willing to proceed.  He and she are aware of need for cath placement.   Greer Wainright T 07/12/2016, 12:42 PM

## 2016-07-23 NOTE — Progress Notes (Addendum)
TCTS DAILY ICU PROGRESS NOTE                   [address]            [name] [address]          [phone]   Day of Surgery Procedure(s) (LRB): Right Heart Cath (N/A)  Total Length of Stay:  LOS: 28 days   Subjective: Patient just returned from right heart cath. She is resting in bed. Does not have complaint at this time.  Objective: Vital signs in last 24 hours: Temp:  [97.3 F (36.3 C)-98.7 F (37.1 C)] 98.7 F (37.1 C) (10/26 0400) Pulse Rate:  [64-82] 73 (10/26 0900) Cardiac Rhythm: Atrial fibrillation (10/26 0000) Resp:  [14-35] 19 (10/26 0900) BP: (80-144)/(23-108) 114/44 (10/26 0900) SpO2:  [89 %-100 %] 98 % (10/26 0900) Weight:  [255 lb 8.2 oz (115.9 kg)] 255 lb 8.2 oz (115.9 kg) (10/26 0600)  Filed Weights   07/21/16 0600 07/22/16 0500 08-12-2016 0600  Weight: 255 lb 8.2 oz (115.9 kg) 253 lb 12 oz (115.1 kg) 255 lb 8.2 oz (115.9 kg)    Weight change: 1 lb 12.2 oz (0.8 kg)   Hemodynamic parameters for last 24 hours: CVP:  [8 mmHg-18 mmHg] 12 mmHg  Intake/Output from previous day: 10/25 0701 - 10/26 0700 In: 1301.9 [P.O.:390; I.V.:761.9; IV Piggyback:150] Out: 951 [Urine:600; Stool:1]  Intake/Output this shift: No intake/output data recorded.  Current Meds: Scheduled Meds: . [MAR Hold] alteplase  2 mg Intracatheter Once  . [MAR Hold] bisacodyl  10 mg Oral Daily   Or  . [MAR Hold] bisacodyl  10 mg Rectal Daily  . [MAR Hold] cefTAZidime (FORTAZ)  IV  2 g Intravenous Q24H  . [MAR Hold] docusate sodium  200 mg Oral BID  . [MAR Hold] feeding supplement  1 Container Oral TID BM  . [MAR Hold] feeding supplement (PRO-STAT SUGAR FREE 64)  30 mL Oral BID  . [MAR Hold] folic acid-pyridoxine-cyancobalamin  1 tablet Oral Daily  . [MAR Hold] furosemide  160 mg Intravenous TID  . [MAR Hold] insulin aspart  0-15 Units Subcutaneous Q4H  . [MAR Hold] ipratropium  0.5 mg Nebulization TID  . [MAR Hold] iron polysaccharides  150 mg Oral Daily  . [MAR  Hold] levalbuterol  1.25 mg Nebulization TID  . [MAR Hold] levothyroxine  50 mcg Oral QAC breakfast  . [MAR Hold] mouth rinse  15 mL Mouth Rinse Q12H  . [MAR Hold] metolazone  10 mg Oral BID  . [MAR Hold] nystatin ointment   Topical BID  . [MAR Hold] sodium chloride flush  10-40 mL Intracatheter Q12H  . [MAR Hold] sodium chloride flush  3 mL Intravenous Q12H  . sodium chloride flush  3 mL Intravenous Q12H  . [MAR Hold] vancomycin  1,250 mg Intravenous Q48H   Continuous Infusions: . sodium chloride    . sodium chloride Stopped (08-12-16 0400)  . sodium chloride 30 mL/hr at 08/12/16 0600  . [MAR Hold] DOBUTamine 5 mcg/kg/min (08/12/16 0600)  . [MAR Hold] norepinephrine (LEVOPHED) Adult infusion Stopped (07/19/16 2015)   PRN Meds:.Place/Maintain arterial line **AND** [MAR Hold] sodium chloride, sodium chloride, [MAR Hold] acetaminophen, [MAR Hold] Gerhardt's butt cream, [MAR Hold] ipratropium-albuterol, [MAR Hold] ondansetron (ZOFRAN) IV, [MAR Hold] RESOURCE THICKENUP CLEAR, [MAR Hold] sodium chloride flush, [MAR Hold] sodium chloride flush, sodium chloride flush  General appearance: Cooperative and no distress Heart: IRRR IRRR Lungs: Diminshed at bases Extremities: ++++ LE edema Wound:  Sternal wound is clean and dry  Lab Results: CBC:  Recent Labs  07/22/16 0417 07/24/2016 0359  WBC 9.2 9.5  HGB 8.4* 7.9*  HCT 26.1* 25.4*  PLT 207 212   BMET:   Recent Labs  07/22/16 0417 07/11/2016 0359  NA 134* 135  K 4.0 4.2  CL 96* 96*  CO2 27 27  GLUCOSE 106* 114*  BUN 85* 95*  CREATININE 2.31* 2.44*  CALCIUM 9.4 9.4    CMET: Lab Results  Component Value Date   WBC 9.5 06/28/2016   HGB 7.9 (L) 07/15/2016   HCT 25.4 (L) 06/28/2016   PLT 212 07/10/2016   GLUCOSE 114 (H) 07/17/2016   TRIG 89 07/10/2016   ALT 16 07/20/2016   AST 23 07/20/2016   NA 135 07/05/2016   K 4.2 07/22/2016   CL 96 (L) 07/24/2016   CREATININE 2.44 (H) 07/28/2016   BUN 95 (H) 07/10/2016   CO2 27  06/30/2016   INR 2.17 07/08/2016   HGBA1C 5.3 06/22/2016    PT/INR:   Recent Labs  07/14/2016 0359  LABPROT 24.5*  INR 2.17   Radiology: No results found.   Assessment/Plan: S/P Procedure(s) (LRB): Right Heart Cath (N/A)  1.CV-A fib. S/p right heart cath-results showed markedly elevated right and left heart filling pressures with pulmonary venous hypertension. Normal CO on Dobutamine. On Dobutamine drip (at 5 ) and Coumadin. Co ox increased to 79.9. INR decreased from 2.55  to 2.17. Has not been given Coumadin last several days. Will restart soon, 2. Pulmonary-acute on chronic respiratory failure, pulmonary hypertension. On 5 liters of oxygen via [place]. CPAP at night. Has bilateral pleural effusions and is s/p bilateral Pleur X placement 10/12. Both being drained every other day. Will obtain CXR in am. Continue current nebs, inhalers. 3. Acute on chronic diastolic heart failure- On Zaroxolyn 10 mg bid and Lasix 160 mg IV tid. Dr. Shirlee LatchMclean following.  4. ABL anemia-H and H relatively stable at 7.9 and 25.4. Continue Niferex and Foltx. 5.ID-on Vanco and Ceftazidime for HCAP (day #5) 6. Deconditioning-continue with PT 7. Poor oral intake,intermittent nausea-combination of right heart failure and CKD. Continue feeding supplements and encourage po. Monitor as may need feeding tube. 8. Acute on chronic CKD-creatinine slightly increased to 2.44  ZIMMERMAN,DONIELLE M PA-C  07/22/2016 9:29 AM   I have seen and examined the patient and agree with the assessment and plan as outlined.  Results of right heart cath noted.  Patient now becoming more lethargic and confused w/ worsening ARF.  Prognosis is quite poor.  Will meet with patient's family later to discuss situation and treatment plans.  Purcell Nailslarence H Owen, MD 07/04/2016

## 2016-07-23 NOTE — Progress Notes (Addendum)
Discussed with Dr. Barry Dieneswens PAs. OK for CCM to place Trialysis cath.   Casimiro NeedleMichael [address]Andy" [place], PA-C 07/22/2016 10:03 AM

## 2016-07-23 NOTE — Progress Notes (Addendum)
TCTS BRIEF PROGRESS NOTE   I met with the patient's son and daughter and discussed her current clinical status and long term prognosis at length.  Likelihood for a good long term outcome is very low.  They expressed several clear directives in keeping with their mother's wishes:  1. Patient should be NO CODE BLUE.  In the event of respiratory failure she should not be intubated.  No CPR. 2. Patient should not be considered a candidate for long term dialysis 3. They do agree with a trial of CVVHD and enteral nutritional support with feeding tube to see if the patient responds favorably over the period of several days to a week.  If she does not improve or get worse they wish to transition to palliative care.  All questions answered.  Rexene Alberts, MD 07/19/2016 6:56 PM

## 2016-07-23 NOTE — H&P (View-Only) (Signed)
Patient ID: Ann Potter, female   DOB: Jul 10, 1941, 75 y.o.   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 TEE on 9/28 EF 55-60%. Post-operative course complicated by respiratory failure, AKI, hypotension, and marked volume overload. Extubated but required reintubation 10/10. Later extubated 10/19.  She has had 3 bronchoscopies --> 10/7, 10/9, 10/12. Pleurex catheters placed  10/12 on R and L.   Echo LVEF 55-60%, moderate posterior pericardial effusion. MVR/TV repair ok. RV mildly dilated and mildly reduced systolic function.   RVSP  CT chest: Possible RUL PNA, small hemorrhagic pericardial effusion.   On dobutamine 5. Coox 67%. CVP 12-13.  Transitioned to high dose Lasix boluses yesterday + metolazone.  Not much improvement in UOP, renal function stable.   Not very mobile, requires a lot of assistance to get out of bed.  Legs tender.   Creatinine 2.36 => 2.25 => 2.30 => 2.29 => 2.31  Objective:   Weight Range:  Vital Signs:   Temp:  [97.4 F (36.3 C)-98.5 F (36.9 C)] 97.4 F (36.3 C) (10/25 0400) Pulse Rate:  [67-81] 80 (10/25 0600) Resp:  [13-26] 24 (10/25 0600) BP: (91-192)/(44-171) 192/171 (10/25 0600) SpO2:  [87 %-100 %] 95 % (10/25 0746) FiO2 (%):  [30 %] 30 % (10/24 2153) Weight:  [253 lb 12 oz (115.1 kg)] 253 lb 12 oz (115.1 kg) (10/25 0500) Last BM Date: 07/21/16  Weight change: Filed Weights   07/20/16 0451 07/21/16 0600 07/22/16 0500  Weight: 253 lb 4.9 oz (114.9 kg) 255 lb 8.2 oz (115.9 kg) 253 lb 12 oz (115.1 kg)    Intake/Output:   Intake/Output Summary (Last 24 hours) at 07/22/16 0849 Last data filed at 07/22/16 0717  Gross per 24 hour  Intake           1565.4 ml  Output             1345 ml    Net            220.4 ml     Physical Exam: CVP 12-13 General: Chronically ill appearing. Obese. Lying in bed.  HEENT: normal Neck: Thick. JVP difficult to assess due to size. Carotids 2+ bilat; no bruits. No lymphadenopathy or thyromegaly noted Cor: PMI nondisplaced. Irregular rate & rhythm. No M/G/R. Sternal incision approximated.  Lungs: Decreased anterior breath sounds.  Abdomen: obese, soft, NT, ND, no HSM. No bruits or masses. +BS  Extremities: no cyanosis, clubbing, rash. 1-2+ edema to knees. Neuro: alert & orientedx3, cranial nerves grossly intact. moves all 4 extremities w/o difficulty. Affect pleasant GU: Foley  Telemetry: Reviewed, AF 70s   Labs: Basic Metabolic Panel:  Recent Labs Lab 07/19/16 0430 07/20/16 0540 07/20/16 1620 07/21/16 0513 07/22/16 0417  NA 135 135 133* 132* 134*  K 4.1 2.8* 3.5 3.6 4.0  CL 96* 96* 95* 93* 96*  CO2 26 27 27 28 27   GLUCOSE 118* 120* 134* 114* 106*  BUN 66* 71* 74* 75* 85*  CREATININE 2.36* 2.25* 2.30* 2.29* 2.31*  CALCIUM 9.3 9.0 9.1 9.0 9.4  MG  --  1.7  --  2.6*  --     Liver Function Tests:  Recent Labs Lab 07/16/16 0452 07/19/16 0430  07/20/16 0540  AST 26 23 23   ALT 18 15 16   ALKPHOS 113 99 92  BILITOT 1.5* 2.0* 1.8*  PROT 5.7* 5.8* 5.6*  ALBUMIN 2.6* 3.3* 3.1*   No results for input(s): LIPASE, AMYLASE in the last 168 hours. No results for input(s): AMMONIA in the last 168 hours.  CBC:  Recent Labs Lab 07/17/16 0500 07/18/16 0343 07/19/16 0430 07/21/16 0513 07/22/16 0417  WBC 8.5 7.5 8.0 6.9 9.2  HGB 8.9* 8.3* 8.5* 8.2* 8.4*  HCT 28.7* 26.4* 26.8* 25.8* 26.1*  MCV 97.3 96.4 96.1 95.9 95.3  PLT 129* 126* 175 186 207    Cardiac Enzymes: No results for input(s): CKTOTAL, CKMB, CKMBINDEX, TROPONINI in the last 168 hours.  BNP: BNP (last 3 results) No results for input(s): BNP in the last 8760 hours.  ProBNP (last 3 results) No results for input(s): PROBNP in the last 8760 hours.    Other  results:  Imaging: Dg Chest Port 1 View  Result Date: 07/22/2016 CLINICAL DATA:  Atelectasis, pneumonia. EXAM: PORTABLE CHEST 1 VIEW COMPARISON:  07/21/2016 and CT chest 07/18/2016. FINDINGS: Trachea is midline. Cardiomediastinal silhouette is enlarged, stable. Right PICC tip projects over the SVC. Bilateral chest tube are in place. Mild to moderate mixed interstitial and airspace opacification is seen bilaterally. Small bilateral effusions. No pneumothorax. Overall appearance of the chest stable from yesterday. IMPRESSION: 1. Mild to moderate mixed interstitial and airspace opacification in the lungs is unchanged and may be due to edema or pneumonia. 2. No definite pneumothorax with bilateral chest tubes in place. 3. Small bilateral pleural effusions. Electronically Signed   By: Leanna Battles M.D.   On: 07/22/2016 08:41   Dg Chest Port 1 View  Result Date: 07/21/2016 CLINICAL DATA:  Shortness of breath. EXAM: PORTABLE CHEST 1 VIEW COMPARISON:  Radiograph of July 19, 2016. FINDINGS: Stable cardiomegaly. Status post cardiac valve replacement. Right-sided PICC line is unchanged in position. Bilateral pleural drainage catheters again noted and unchanged. Stable diffuse bilateral lung opacities are noted concerning for edema or inflammation. Stable probable mild bilateral pleural effusions are noted. Bony thorax is unremarkable. IMPRESSION: Stable support apparatus. Stable bilateral lung opacities and small pleural effusions as described above. Electronically Signed   By: Lupita Raider, M.D.   On: 07/21/2016 08:59     Medications:     Scheduled Medications: . acetaZOLAMIDE  250 mg Oral Once  . alteplase  2 mg Intracatheter Once  . bisacodyl  10 mg Oral Daily   Or  . bisacodyl  10 mg Rectal Daily  . cefTAZidime (FORTAZ)  IV  2 g Intravenous Q24H  . docusate sodium  200 mg Oral BID  . feeding supplement  1 Container Oral TID BM  . feeding supplement (PRO-STAT SUGAR FREE 64)  30 mL Oral  BID  . folic acid-pyridoxine-cyancobalamin  1 tablet Oral Daily  . furosemide  160 mg Intravenous TID  . insulin aspart  0-15 Units Subcutaneous Q4H  . ipratropium  0.5 mg Nebulization TID  . iron polysaccharides  150 mg Oral Daily  . levalbuterol  1.25 mg Nebulization TID  . levothyroxine  50 mcg Oral QAC breakfast  . mouth rinse  15 mL Mouth Rinse Q12H  . metolazone  10 mg Oral BID  . metolazone  5 mg Oral Once  . nystatin ointment   Topical BID  . sodium chloride flush  10-40 mL Intracatheter Q12H  . sodium chloride flush  3 mL Intravenous Q12H  . vancomycin  1,250 mg Intravenous Q48H    Infusions: . sodium chloride 30 mL/hr at 07/22/16 0600  . DOBUTamine 5 mcg/kg/min (07/22/16 0729)  . norepinephrine (LEVOPHED) Adult infusion Stopped (07/19/16 2015)    PRN Medications: Place/Maintain arterial line **AND** sodium chloride, acetaminophen, Gerhardt's butt cream, ipratropium-albuterol, ondansetron (ZOFRAN) IV, RESOURCE THICKENUP CLEAR, sodium chloride flush, sodium chloride flush   Assessment/Plan:    1. Hypotension:  Echo showed LV EF normal.  RV function is reduced but not markedly so.  Some concern for effusive-constrictive pericarditis based on septal bounce but mitral inflow respirometry is not consistent with this and effusion is relatively small.  Suspect hypotension has led to ATN on top of baseline PAH. Now resolved.  - BP now stable.  - Blood cultures NGTD 2. 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  Co-ox 67%.  UOP poor despite high dose diuretics.  Creatinine stable. Currently on dobutamine for RV support.  - Continue Lasix 160 mg IV tid and will increase metolazone to 10 bid + a dose of acetazolamide today. . - Will need RHC, PASP on echo only 31 mmHg but suspect PA pressure is probably higher with longstanding MV disease. May benefit from switching dobutamine to milrinone => given lack of progress with dobutamine,  will stop and start milrinone 0.25. INR 2.55 today, will let trend down to closer to 2 and plan RHC tomorrow.  3. AKI: Suspect ATN in setting of hypotension.  - Relatively stable, though remains elevated.   4. Pericardial effusion: Moderate posterior organized pericardial effusion, concern for effusive-constrictive pericarditis but MV inflow respirometry not suggestive (though not sure that MV inflow respirometry is as accurate with bioprosthetic MV).  Effusion small by chest CT.  5. S/p bioprosthetic MV: Valve appears to function normally on echo.  6. Atrial fibrillation: Persistent. Back in AF s/p Maze.  On warfarin. INR 2.55.  Awaiting down-trend for RHC.   - Would like to get INR close to 2.0 range for RHC. - Consider DCCV once diuresed and off dobutamine.  7. Pleural effusions bilaterally: Pleurx catheters per TCTS 8. ID: Possible right upper lobe PNA by CT chest.  Empiric vanco/ceftazidime.   Length of Stay: [address]27  Marca AnconaDalton Fransheska Willingham 07/22/2016, 8:49 AM  Advanced Heart Failure Team Pager [phone] (M-F; 7a - 4p)  Please contact [facility] for night-coverage after hours (4p -7a ) and weekends on [email or web address]

## 2016-07-23 NOTE — Progress Notes (Signed)
Site area: Rt fem vein 647fr sheath removed by Richardson DoppLaura Murphy-RN Site Prior to Removal:  Level 0 Pressure Applied For: 15 min Manual: yes   Patient Status During Pull:  A/O Post Pull Site:  Level 0 Post Pull Instructions Given:  Yes Post Pull Pulses Present: 1+ rt dp.  Pulses are marked Dressing Applied:  4x4 and tegaderm Bedrest begins @ 09:05:00 Comments: Pt returns to unit from Cath lab Holding. Rt groin is unremarkable. Dressing is CDI.

## 2016-07-24 ENCOUNTER — Encounter (HOSPITAL_COMMUNITY): Payer: Self-pay | Admitting: Thoracic Surgery (Cardiothoracic Vascular Surgery)

## 2016-07-24 ENCOUNTER — Inpatient Hospital Stay (HOSPITAL_COMMUNITY): Payer: Medicare Other

## 2016-07-24 DIAGNOSIS — I50810 Right heart failure, unspecified: Secondary | ICD-10-CM | POA: Diagnosis present

## 2016-07-24 LAB — RENAL FUNCTION PANEL
ALBUMIN: 2.5 g/dL — AB (ref 3.5–5.0)
ANION GAP: 14 (ref 5–15)
BUN: 97 mg/dL — AB (ref 6–20)
CALCIUM: 8.9 mg/dL (ref 8.9–10.3)
CO2: 24 mmol/L (ref 22–32)
Chloride: 94 mmol/L — ABNORMAL LOW (ref 101–111)
Creatinine, Ser: 2.4 mg/dL — ABNORMAL HIGH (ref 0.44–1.00)
GFR calc Af Amer: 21 mL/min — ABNORMAL LOW (ref >60)
GFR, EST NON AFRICAN AMERICAN: 19 mL/min — AB (ref >60)
Glucose, Bld: 275 mg/dL — ABNORMAL HIGH (ref 65–99)
PHOSPHORUS: 4.6 mg/dL (ref 2.5–4.6)
POTASSIUM: 2.9 mmol/L — AB (ref 3.5–5.1)
Sodium: 132 mmol/L — ABNORMAL LOW (ref 135–145)

## 2016-07-24 LAB — BASIC METABOLIC PANEL
Anion gap: 14 (ref 5–15)
BUN: 99 mg/dL — AB (ref 6–20)
CHLORIDE: 96 mmol/L — AB (ref 101–111)
CO2: 25 mmol/L (ref 22–32)
CREATININE: 2.55 mg/dL — AB (ref 0.44–1.00)
Calcium: 9.3 mg/dL (ref 8.9–10.3)
GFR calc Af Amer: 20 mL/min — ABNORMAL LOW (ref >60)
GFR calc non Af Amer: 17 mL/min — ABNORMAL LOW (ref >60)
GLUCOSE: 120 mg/dL — AB (ref 65–99)
Potassium: 3.4 mmol/L — ABNORMAL LOW (ref 3.5–5.1)
Sodium: 135 mmol/L (ref 135–145)

## 2016-07-24 LAB — CBC
HEMATOCRIT: 25.8 % — AB (ref 36.0–46.0)
HEMOGLOBIN: 8.1 g/dL — AB (ref 12.0–15.0)
MCH: 30.3 pg (ref 26.0–34.0)
MCHC: 31.4 g/dL (ref 30.0–36.0)
MCV: 96.6 fL (ref 78.0–100.0)
Platelets: 199 10*3/uL (ref 150–400)
RBC: 2.67 MIL/uL — ABNORMAL LOW (ref 3.87–5.11)
RDW: 18.3 % — ABNORMAL HIGH (ref 11.5–15.5)
WBC: 10.6 10*3/uL — ABNORMAL HIGH (ref 4.0–10.5)

## 2016-07-24 LAB — GLUCOSE, CAPILLARY
GLUCOSE-CAPILLARY: 120 mg/dL — AB (ref 65–99)
GLUCOSE-CAPILLARY: 136 mg/dL — AB (ref 65–99)
Glucose-Capillary: 114 mg/dL — ABNORMAL HIGH (ref 65–99)

## 2016-07-24 LAB — COOXEMETRY PANEL
Carboxyhemoglobin: 2.3 % — ABNORMAL HIGH (ref 0.5–1.5)
Methemoglobin: 0.9 % (ref 0.0–1.5)
O2 SAT: 61 %
Total hemoglobin: 8.1 g/dL — ABNORMAL LOW (ref 12.0–16.0)

## 2016-07-24 LAB — PROTIME-INR
INR: 1.88
Prothrombin Time: 21.8 seconds — ABNORMAL HIGH (ref 11.4–15.2)

## 2016-07-24 LAB — PHOSPHORUS
Phosphorus: 4.7 mg/dL — ABNORMAL HIGH (ref 2.5–4.6)
Phosphorus: 4.5 mg/dL (ref 2.5–4.6)

## 2016-07-24 LAB — MAGNESIUM
MAGNESIUM: 2.6 mg/dL — AB (ref 1.7–2.4)
MAGNESIUM: 2.4 mg/dL (ref 1.7–2.4)

## 2016-07-24 MED ORDER — VANCOMYCIN HCL 10 G IV SOLR
1250.0000 mg | INTRAVENOUS | Status: DC
  Filled 2016-07-24: qty 1250

## 2016-07-24 MED ORDER — PRISMASOL BGK 4/2.5 32-4-2.5 MEQ/L IV SOLN
INTRAVENOUS | Status: DC
  Filled 2016-07-24 (×5): qty 5000

## 2016-07-24 MED ORDER — PRO-STAT SUGAR FREE PO LIQD: 30.0000 mL | Freq: Every day | ORAL | Status: DC

## 2016-07-24 MED ORDER — DEXTROSE 5 % IV SOLN
2.0000 g | Freq: Two times a day (BID) | INTRAVENOUS | Status: DC
  Filled 2016-07-24: qty 2

## 2016-07-24 MED ORDER — SODIUM CHLORIDE 0.9 % IV SOLN
5.0000 mg/h | INTRAVENOUS | Status: DC
  Administered 2016-07-24: 2 mg/h via INTRAVENOUS
  Filled 2016-07-24: qty 10

## 2016-07-24 MED ORDER — HEPARIN SODIUM (PORCINE) 1000 UNIT/ML DIALYSIS: 1000.0000 [IU] | INTRAMUSCULAR | Status: DC | PRN

## 2016-07-24 MED ORDER — WARFARIN SODIUM 5 MG PO TABS: 5.0000 mg | ORAL_TABLET | Freq: Once | ORAL | Status: DC

## 2016-07-24 MED ORDER — SODIUM CHLORIDE 0.9 % FOR CRRT
INTRAVENOUS_CENTRAL | Status: DC | PRN
  Filled 2016-07-24: qty 1000

## 2016-07-24 MED ORDER — WARFARIN - PHARMACIST DOSING INPATIENT
Freq: Every day | Status: DC
Start: 2016-07-24 — End: 2016-07-24

## 2016-07-24 MED ORDER — PRISMASOL BGK 4/2.5 32-4-2.5 MEQ/L IV SOLN
INTRAVENOUS | Status: DC
  Filled 2016-07-24 (×2): qty 5000

## 2016-07-24 MED ORDER — JEVITY 1.2 CAL PO LIQD
1000.0000 mL | ORAL | Status: DC
  Filled 2016-07-24 (×3): qty 1000

## 2016-07-24 NOTE — Progress Notes (Signed)
Brandy, CCM to hold off on placing cortrak at this time due to pt inability to stay off bipap for procedure.

## 2016-07-24 NOTE — Progress Notes (Signed)
ANTICOAGULATION CONSULT NOTE - Initial Consult ANTIBIOTIC CONSULT NOTE - Follow Up Consult  Pharmacy Consult for Coumadin and Vancomycin/Ceftazidime Indication: afib/MVR and HCAP  Allergies  Allergen Reactions  . Relafen [Nabumetone] Other (See Comments)    Bladder pain    Patient Measurements: Height: 5\' 1"  (154.9 cm) Weight: 253 lb 8.5 oz (115 kg) IBW/kg (Calculated) : 47.8  Vital Signs: Temp: 97.6 F (36.4 C) (10/27 0356) Temp Source: Axillary (10/27 0356) BP: 100/48 (10/27 1100) Pulse Rate: 63 (10/27 1100)  Labs:  Recent Labs  07/22/16 0417 07/10/2016 0359 07/24/16 0422  HGB 8.4* 7.9* 8.1*  HCT 26.1* 25.4* 25.8*  PLT 207 212 199  LABPROT 27.9* 24.5* 21.8*  INR 2.55 2.17 1.88  CREATININE 2.31* 2.44* 2.55*    Estimated Creatinine Clearance: 22.1 mL/min (by C-G formula based on SCr of 2.55 mg/dL (H)).  Assessment: 75yof on xarelto pta for afib, s/p bioprosthetic MVR on 9/28 and switched to coumadin. Last dose taken 10/21 (initially held due to elevated INR then purposely held pending RHC). She is now s/p RHC and will require CRRT for fluid removal. HD cath to be placed today and coumadin to resume after per pharmacy. INR 1.88.   She also continues on day #6 vancomycin and ceftazidime for possible HCAP. Will need to adjust doses for CRRT.  Cefuroxime 9/28>>9/30 Ceftazidime 10/6 >>10/17, Resume 10/22>> Vancomycin 9/28 x 2 (OR day), Resume 10/9>>10/17, Resume 10/22 >>  10/20 BCx x2: negative final 10/20 UCx: < 10,000 colonies 10/10 acid fast smear (BAL) - negative 10/10 BAL specimen B  - no organisms seen 10/10 BAL specimen C - no organisms seen 10/9 bronchial washings - 10K normal flora 10/9 TA - normal flora 10/6 TA - no organisms seen 9/25 MRSA PCR neg   Goal of Therapy:  INR 2-3 Monitor platelets by anticoagulation protocol: Yes  Vancomycin trough 15-20   Plan:  1) Coumadin 5mg  x 1 2) Daily INR 3) Change ceftazidime to 2g IV q12 4) Change  vancomycin to 1250mg  IV q24 5) Follow CRRT tolerance/duration, check vancomycin level if indicated  Fredrik RiggerMarkle, Jadriel Saxer Sue 07/24/2016,11:33 AM

## 2016-07-24 NOTE — Progress Notes (Addendum)
Nutrition Consult/Follow Up  DOCUMENTATION CODES:   Morbid obesity  INTERVENTION:    If TF started, initiate Jevity 1.2 formula at goal rate of 55 ml/h (1320 ml per day) and Prostat 30 ml daily to provide 1684 kcals, 88 gm protein, 1065 ml free water daily.  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  Patient s/p procedures 10/10: VIDEO BRONCHOSCOPY INTUBATION REMOVAL OF SECRETIONS   Pt currently on BiPAP. Previously on a Dysphagia 2-thin liquid diet with poor PO intake. CORTRAK small bore feeding tube placement pending. Vital HP formula ordered via Adult Tube Feeding Protocol. Labs and medications reviewed.    Diet Order:  Diet NPO time specified  Skin:  Reviewed, no issues   CBG (last 3)   Recent Labs  07/08/2016 2348 07/24/16 0355 07/24/16 0855  GLUCAP 118* 114* 120*   Last BM:  10/27  Height:   Ht Readings from Last 1 Encounters:  07/03/16 5\' 1"  (1.549 m)    Weight:   Wt Readings from Last 1 Encounters:  07/24/16 253 lb 8.5 oz (115 kg)    Ideal Body Weight:  45.4 kg  BMI:  Body mass index is 47.9 kg/m.  Re-estimated Nutritional Needs:   Kcal:  1600-1800  Protein:  85-95 gm  Fluid:  1.6-1.8 L  EDUCATION NEEDS:   No education needs identified at this time  Maureen ChattersKatie Zyrell Carmean, RD, LDN Pager #: [phone] After-Hours Pager #: [phone]

## 2016-07-24 NOTE — Progress Notes (Signed)
PT Cancellation Note  Patient Details Name: Ann Potter MRN: [id number] DOB: [birth year]   Cancelled Treatment:    Reason Eval/Treat Not Completed: Medical issues which prohibited therapy (pt back on Bipap, about to start CRRT and currently not appropriate with hold requested per RN)   [name], [name] 07/24/2016, 11:15 AM  Delaney MeigsMaija Tabor Mazin Emma, PT [phone]

## 2016-07-24 NOTE — Progress Notes (Signed)
Spoke with CCM.  They have limited availability this am, but will place trialysis cath for CVVHD when they are available.

## 2016-07-24 NOTE — Progress Notes (Addendum)
[address]       [name] [address]             [phone]        CARDIOTHORACIC SURGERY PROGRESS NOTE  R29Days Post-Op Procedure(s) (LRB): MITRAL VALVE (MV) REPLACEMENT (N/A) TRICUSPID VALVE REPAIR (N/A) MAZE (N/A) TRANSESOPHAGEAL ECHOCARDIOGRAM (TEE) (N/A)   R1 Day Post-Op Procedure(s) (LRB): Right Heart Cath (N/A)  Subjective: Looks more tired and dyspneic.  Denies pain.  Follows commands  Objective: Vital signs: BP Readings from Last 1 Encounters:  07/24/16 (!) 94/37   Pulse Readings from Last 1 Encounters:  07/24/16 65   Resp Readings from Last 1 Encounters:  07/24/16 18   Temp Readings from Last 1 Encounters:  07/24/16 97.6 F (36.4 C) (Axillary)    Hemodynamics: CVP:  [14 mmHg-20 mmHg] 16 mmHg  Mixed venous co-ox 61%  Physical Exam:  Rhythm:   Afib  Breath sounds: Diminished at bases, few rhonchi  Heart sounds:  RRR  Incisions:  Clean and dry  Abdomen:  Soft, non-tender  Extremities:  Warm, well-perfused, very swollen   Intake/Output from previous day: 10/26 0701 - 10/27 0700 In: 1193.8 [I.V.:695.8; IV Piggyback:498] Out: 717 [Urine:715; Stool:2] Intake/Output this shift: Total I/O In: -  Out: 48 [Urine:48]  Lab Results:  CBC: Recent Labs  06/28/2016 0359 07/24/16 0422  WBC 9.5 10.6*  HGB 7.9* 8.1*  HCT 25.4* 25.8*  PLT 212 199    BMET:  Recent Labs  07/28/2016 0359 07/24/16 0422  NA 135 135  K 4.2 3.4*  CL 96* 96*  CO2 27 25  GLUCOSE 114* 120*  BUN 95* 99*  CREATININE 2.44* 2.55*  CALCIUM 9.4 9.3     PT/INR:   Recent Labs  07/24/16 0422  LABPROT 21.8*  INR 1.88    CBG (last 3)   Recent Labs  07/21/2016 1940 07/24/2016 2348 07/24/16 0355  GLUCAP 108* 118* 114*    ABG    Component Value Date/Time   PHART 7.451 (H) 07/11/2016 1205   PCO2ART 49.1 (H) 07/11/2016 1205   PO2ART 78.2 (L) 07/11/2016 1205   HCO3 27.9 07/06/2016 0824   TCO2 30 07/16/2016 0824   ACIDBASEDEF 3.0 (H)  06/26/2016 0859   O2SAT 61.0 07/24/2016 0415    CXR: PORTABLE CHEST 1 VIEW  COMPARISON:  07/22/2016  FINDINGS: Status post median sternotomy and valve replacement. A left atrial appendage clip is present. Right-sided PICC line tip overlies the superior vena cava. Bilateral pleural drainage catheters are present. No pneumothorax. The heart is enlarged. There are bilateral pleural effusions. Interstitial edema persists. Bibasilar atelectasis is noted.  IMPRESSION: 1. Cardiomegaly and mild edema. 2. Bibasilar atelectasis.   Electronically Signed   By: Norva Pavlov M.D.   On: 07/24/2016 08:36   Assessment/Plan:  Clinically stable but slowly getting worse due to severe RV failure, worsening acute renal failure and failure to thrive Maintaining stablerhythm and BPon dobutamine@5 , mixed venous co-ox down to 61%, CVP 16 Breathing stable but somewhat more labored w/ O2 sats 96-100% on 5L/min via Strong Acute on chronic diastolic CHF with expected post-op volume excess,I/O's essentially balancedlast few days, weight >>preop Bilateral pleural effusions s/p bilateral Pleur-X catheter placement  HCAP on Vanc + Fortaz - CXR improved Chronic anticoagulation, warfarin on hold last several days and INR trending down Expected post op acute blood loss anemia, Hgb down slightly8.1 Longstanding rheumatic heart disease  Morbid obesity Physical deconditioning - severe Protein-depleted malnutrition - severe Chronic venous  insufficiency    Trial of tube feeds and CVVHD to see if patient improves  For placement of post-pyloric feeding tube and subclavian dialysis catheter today  Transition to palliative care if patient doesn't show signs of significant improvement over the next week or so and sooner if her condition deteriorates further  Purcell Nailslarence H Jadakiss Barish, MD 07/24/2016 8:19 AM

## 2016-07-24 NOTE — Progress Notes (Signed)
TCTS BRIEF PROGRESS NOTE   Patient's respiratory status has deteriorated significantly.  Now on BiPAP w/ 100% FiO2.  Her breathing is labored and O2 saturations drop quickly if BiPAP off.  Feeding tube placement on hold due to worsened respiratory status.  Discussed developments at bedside with patient's son.  He requests that we stop all artificial means of prolonging her life and make certain that she is comfortable.  Will transition to palliative care only for end of life.  Stop all drips.  Begin morphine drip.  Purcell Nailslarence H Racquel Arkin, MD 07/24/2016 7:11 PM

## 2016-07-24 NOTE — Care Management Important Message (Signed)
Important Message  Patient Details  Name: Ann PoeGloria [place] MRN: [id number] Date of Birth: [birth year]   Medicare Important Message Given:  Yes    Kyla BalzarineShealy, Starlina Lapre [name] 07/24/2016, 2:33 PM

## 2016-07-24 NOTE — Progress Notes (Signed)
Pt currently on Bipap. Bedside nurse states that pt doesn't tolerate being off Bipap very well. Bedside nurse states that pt also needs subclavian line placed at this time and states that she will recall if Cortrak tube is still indicated. Please call Cortrak team with any questions or concerns.

## 2016-07-24 NOTE — Progress Notes (Signed)
Dr. Arvilla MarketFenstein notified of Dr. Orvan Julywen's request to place subclavian access for CRRT.  Tyson AliasFeinstein asked for me to confirm with Dr. Cornelius Moraswen that he wanted access in subclavian.  Dr Cornelius Moraswen called in OR.to confirm.  Dr Cornelius Moraswen to place this afternoon since CCM did not want to place in subclavian.

## 2016-07-24 NOTE — Progress Notes (Signed)
Patient placed on BiPAP at this time due to increase work of breathing. RN aware.

## 2016-07-24 NOTE — Progress Notes (Signed)
Admit: Jul 09, 2016 LOS: 29  52F s/p MVR, TV repair, MAZE 9/28 with AKI, R sided CHF / cardiogenic shock; progressive debility  Subjective:  Remains on dobutamine Inadequate diuretic response Renal US no acute structural issues U Na < 10   10/26 0701 - 10/27 0700 In: 1193.8 [I.V.:695.8; IV Piggyback:498] Out: 717 [Urine:715; Stool:2]  Filed Weights   07/22/16 0500 06/29/2016 0600 07/24/16 0500  Weight: 115.1 kg (253 lb 12 oz) 115.9 kg (255 lb 8.2 oz) 115 kg (253 lb 8.5 oz)    Scheduled Meds: . alteplase  2 mg Intracatheter Once  . bisacodyl  10 mg Oral Daily   Or  . bisacodyl  10 mg Rectal Daily  . cefTAZidime (FORTAZ)  IV  2 g Intravenous Q24H  . docusate sodium  200 mg Oral BID  . feeding supplement  1 Container Oral TID BM  . feeding supplement (PRO-STAT SUGAR FREE 64)  30 mL Per Tube BID  . feeding supplement (VITAL HIGH PROTEIN)  1,000 mL Per Tube Q24H  . folic acid-pyridoxine-cyancobalamin  1 tablet Oral Daily  . furosemide  160 mg Intravenous TID  . insulin aspart  0-15 Units Subcutaneous Q4H  . ipratropium  0.5 mg Nebulization TID  . iron polysaccharides  150 mg Oral Daily  . levalbuterol  1.25 mg Nebulization TID  . levothyroxine  50 mcg Oral QAC breakfast  . mouth rinse  15 mL Mouth Rinse Q12H  . metolazone  10 mg Oral BID  . nystatin ointment   Topical BID  . vancomycin  1,250 mg Intravenous Q48H   Continuous Infusions: . sodium chloride Stopped (07/22/2016 0400)  . DOBUTamine 3 mcg/kg/min (07/06/2016 1900)  . norepinephrine (LEVOPHED) Adult infusion Stopped (07/19/16 2015)   PRN Meds:.Place/Maintain arterial line **AND** sodium chloride, acetaminophen, Gerhardt's butt cream, ipratropium-albuterol, ondansetron (ZOFRAN) IV, RESOURCE THICKENUP CLEAR, sodium chloride flush, sodium chloride flush  Current Labs: reviewed    Physical Exam:  Blood pressure (!) 95/56, pulse 70, temperature 97.6 F (36.4 C), temperature source Axillary, resp. rate 18, height 5\' 1"   (1.549 m), weight 115 kg (253 lb 8.5 oz), SpO2 98 %. On BIPAP, NAD Obese 4+ LEE RRR Coarse bs throughout EOMI NCAT Sternal incision healed  A 1. Progressive AoCKD3, likely ATN vs cardiorenal (UNa < 10 on diuretics suggests latter); renal US 10/26 no acute issues 2. S/p bioprosthetic MVR, TV repair, MAZE 9/28 3. R sided HF, on dobutamine; cardiogenic shock 4. Hypervolemia; diuretic refractory 5. Systemic Anticoagulation with warfarin, INR 1.9 today 6. Progressive failure to thrive 7. ? HCAP on ABX  P 1. Trial of CRRT today: 4K, UF 50-100 net net/hr; no heparin in circuit at first 2. Stop diuretics once CRRT started   Sabra Heck MD 07/24/2016, 9:18 AM   Recent Labs Lab 07/22/16 0417 07/02/2016 0359 07/24/2016 1418 07/03/2016 2000 07/24/16 0422  NA 134* 135  --   --  135  K 4.0 4.2  --   --  3.4*  CL 96* 96*  --   --  96*  CO2 27 27  --   --  25  GLUCOSE 106* 114*  --   --  120*  BUN 85* 95*  --   --  99*  CREATININE 2.31* 2.44*  --   --  2.55*  CALCIUM 9.4 9.4  --   --  9.3  PHOS  --   --  4.2 4.6 4.7*    Recent Labs Lab 07/22/16 0417 07/17/2016 0359 07/24/16 0422  WBC  9.2 9.5 10.6*  HGB 8.4* 7.9* 8.1*  HCT 26.1* 25.4* 25.8*  MCV 95.3 96.2 96.6  PLT 207 212 199

## 2016-07-24 NOTE — Progress Notes (Signed)
Patient ID: Ann Potter, female   DOB: 03-05-1941, 75 y.o.   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 TEE on 9/28 EF 55-60%. Post-operative course complicated by respiratory failure, AKI, hypotension, and marked volume overload. Extubated but required reintubation 10/10. Later extubated 10/19.  She has had 3 bronchoscopies --> 10/7, 10/9, 10/12. Pleurex catheters placed  10/12 on R and L.   Echo LVEF 55-60%, moderate posterior pericardial effusion. MVR/TV repair ok. RV mildly dilated and mildly reduced systolic function.  RVSP 31 mmHG  CT chest: Possible RUL PNA, small hemorrhagic pericardial effusion.   On dobutamine 3 (attempted transition to milrinone on 10/25 but became hypotensive, switched back to dobutamine).  Co-ox 61%. RHC see below.  DNR/DNI as of 10/26.   Got high dose Lasix + metolazone yesterday again with minimal response.  BUN/creatinine continue to rise.  Getting confused. CVP 22 today.   Creatinine 2.36 => 2.25 => 2.30 => 2.29 => 2.31 => 2.44 => BUN 99/creatinine 2.55.  RHC Procedural Findings (10/26): Hemodynamics (mmHg) RA mean 24 RV 80/16, mean 29 PA 83/32, mean 54 PCWP mean 35 Oxygen saturations: PA 64% AO 99% Cardiac Output (Fick) 7.39  Cardiac Index (Fick) 3.54 PVR 2.6 WU Cardiac Output (Thermo) 7.08 Cardiac Index (Thermo) 3.39  Objective:   Weight Range:  Vital Signs:   Temp:  [97.2 F (36.2 C)-97.7 F (36.5 C)] 97.6 F (36.4 C) (10/27 0356) Pulse Rate:  [57-75] 65 (10/27 0700) Resp:  [14-26] 18 (10/27 0700) BP: (84-193)/(23-181) 94/37 (10/27 0700) SpO2:  [95 %-100 %] 97 % (10/27 0700) Weight:  [253 lb 8.5 oz (115 kg)] 253 lb 8.5 oz (115 kg) (10/27 0500) Last BM Date:  07/12/2016  Weight change: Filed Weights   07/22/16 0500 07/22/2016 0600 07/24/16 0500  Weight: 253 lb 12 oz (115.1 kg) 255 lb 8.2 oz (115.9 kg) 253 lb 8.5 oz (115 kg)    Intake/Output:   Intake/Output Summary (Last 24 hours) at 07/24/16 0835 Last data filed at 07/24/16 0800  Gross per 24 hour  Intake           1125.2 ml  Output              765 ml  Net            360.2 ml     Physical Exam: General: Chronically ill appearing. Obese. Lying in bed.  HEENT: normal Neck: Thick. JVP difficult to assess due to size, appears elevated. Carotids 2+ bilat; no bruits. No lymphadenopathy or thyromegaly noted Cor: PMI nondisplaced. Irregular rate & rhythm. No M/G/R. Sternal incision approximated.  Lungs: Decreased anterior breath sounds.  Abdomen: obese, soft, NT, ND, no HSM. No bruits or masses. +BS  Extremities: no cyanosis, clubbing, rash. 1-2+ edema to knees. Neuro: alert & orientedx3, cranial nerves grossly intact. moves all 4 extremities w/o difficulty. Affect pleasant GU: Foley  Telemetry: Reviewed, AF 70s   Labs: Basic Metabolic Panel:  Recent Labs Lab 07/20/16 0540 07/20/16 1620 07/21/16 0513 07/22/16 0417 07/08/2016 0359 07/06/2016 1418 07/27/2016 2000 07/24/16 0422  NA 135 133* 132* 134* 135  --   --  135  K 2.8* 3.5 3.6 4.0 4.2  --   --  3.4*  CL 96* 95* 93* 96* 96*  --   --  96*  CO2 27 27 28 27 27   --   --  25  GLUCOSE 120* 134* 114* 106* 114*  --   --  120*  BUN 71* 74* 75* 85* 95*  --   --  99*  CREATININE 2.25* 2.30* 2.29* 2.31* 2.44*  --   --  2.55*  CALCIUM 9.0 9.1 9.0 9.4 9.4  --   --  9.3  MG 1.7  --  2.6*  --   --   --  2.5* 2.6*  PHOS  --   --   --   --   --  4.2 4.6 4.7*    Liver Function Tests:  Recent Labs Lab 07/19/16 0430 07/20/16 0540  AST 23 23  ALT 15 16  ALKPHOS 99 92  BILITOT 2.0* 1.8*  PROT 5.8* 5.6*  ALBUMIN 3.3* 3.1*   No results for input(s): LIPASE, AMYLASE in the last 168 hours. No results for input(s): AMMONIA in the last 168  hours.  CBC:  Recent Labs Lab 07/19/16 0430 07/21/16 0513 07/22/16 0417 07/05/2016 0359 07/24/16 0422  WBC 8.0 6.9 9.2 9.5 10.6*  HGB 8.5* 8.2* 8.4* 7.9* 8.1*  HCT 26.8* 25.8* 26.1* 25.4* 25.8*  MCV 96.1 95.9 95.3 96.2 96.6  PLT 175 186 207 212 199    Cardiac Enzymes: No results for input(s): CKTOTAL, CKMB, CKMBINDEX, TROPONINI in the last 168 hours.  BNP: BNP (last 3 results) No results for input(s): BNP in the last 8760 hours.  ProBNP (last 3 results) No results for input(s): PROBNP in the last 8760 hours.    Other results:  Imaging: [facility] Renal  Result Date: 07/24/2016 CLINICAL DATA:  Acute renal failure EXAM: RENAL / URINARY TRACT ULTRASOUND COMPLETE COMPARISON:  05/28/2016 CT scan FINDINGS: Right Kidney: Length: 11.2 cm. Echogenicity within normal limits. No mass or hydronephrosis visualized. Left Kidney: Length: 10.0 cm. Echogenicity within normal limits. Left kidney mid upper pole cyst approximately 2.5 cm diameter. Bladder: Not well seen; the bladder is collapsed down around a Foley catheter. Other: Reduced sensitivity due to body habitus and surgical bandaging complicating obtaining sonic windows. IMPRESSION: 1. No hydronephrosis is identified. Urinary bladder is collapsed but otherwise difficult to characterize. 2. Left mid upper renal cyst. Electronically Signed   By: Gaylyn RongWalter  Liebkemann M.D.   On: 07/22/2016 16:55     Medications:     Scheduled Medications: . alteplase  2 mg Intracatheter Once  . bisacodyl  10 mg Oral Daily   Or  . bisacodyl  10 mg Rectal Daily  . cefTAZidime (FORTAZ)  IV  2 g Intravenous Q24H  . docusate sodium  200 mg Oral BID  . feeding supplement  1 Container Oral TID BM  . feeding supplement (PRO-STAT SUGAR FREE 64)  30 mL Per Tube BID  . feeding supplement (VITAL HIGH PROTEIN)  1,000 mL Per Tube Q24H  . folic acid-pyridoxine-cyancobalamin  1 tablet Oral Daily  . furosemide  160 mg Intravenous TID  . insulin aspart  0-15 Units  Subcutaneous Q4H  . ipratropium  0.5 mg Nebulization TID  . iron polysaccharides  150 mg Oral Daily  . levalbuterol  1.25 mg Nebulization TID  . levothyroxine  50 mcg Oral QAC breakfast  . mouth rinse  15 mL Mouth Rinse Q12H  . metolazone  10 mg Oral BID  . nystatin ointment   Topical BID  . vancomycin  1,250 mg Intravenous  Q48H    Infusions: . sodium chloride Stopped (18-Aug-2016 0400)  . DOBUTamine 3 mcg/kg/min (August 18, 2016 1900)  . norepinephrine (LEVOPHED) Adult infusion Stopped (07/19/16 2015)    PRN Medications: Place/Maintain arterial line **AND** sodium chloride, acetaminophen, Gerhardt's butt cream, ipratropium-albuterol, ondansetron (ZOFRAN) IV, RESOURCE THICKENUP CLEAR, sodium chloride flush, sodium chloride flush   Assessment/Plan:    1. Hypotension:  Suspect this led to ATN. Now resolved on dobutamine (BP dropped when we tried to transition to milrinone).  - BP now stable.  - Blood cultures NGTD 2. Acute on chronic diastolic CHF with RV failure: Markedly elevated PCWP and RA pressure on RHC 10/26 with pulmonary venous hypertension.  Preserved cardiac output on cath.  Co-ox 61%.  UOP remains poor despite high dose diuretics, BUN/creatinine rising.  CVP 22 today.  - Continue dobutamine for RV support at 3 mcg/kg/min.    - Our only option besides palliative care at this point given failure of high dose diuretics is to give her a trial of CVVH and then hope for renal recovery once volume is off.  She would not be a good candidate for long-term HD.  Needs dialysis catheter from CCM today, then start CVVH.  3. AKI: Suspect initial ATN in setting of hypotension. BP now stable. BUN/creatinine higher today with attempted diuresis.  More confused, likely due to uremia.  As above, renal consulted for CVVH.   4. Pericardial effusion: Moderate posterior organized pericardial effusion, concern for effusive-constrictive pericarditis but MV inflow respirometry not suggestive (though not sure  that MV inflow respirometry is as accurate with bioprosthetic MV).  Effusion small by chest CT.  5. S/p bioprosthetic MV: Valve appears to function normally on echo.  6. Atrial fibrillation: Persistent. Back in AF s/p Maze.   - Could consider DCCV once diuresed and off dobutamine, but doubt it would have a large effect.   - Restart warfarin today after dialysis catheter placed.  7. Pleural effusions bilaterally: Pleurx catheters per TCTS 8. ID: Possible right upper lobe PNA by CT chest. Also with UTI.  Empiric vanco/ceftazidime.  9. DNR/DNI.    Length of Stay: 29  Marca Ancona 07/24/2016, 8:35 AM  Advanced Heart Failure Team Pager [phone] (M-F; 7a - 4p)  Please contact [facility] for night-coverage after hours (4p -7a ) and weekends on [email or web address]

## 2016-07-25 LAB — GLUCOSE, CAPILLARY: Glucose-Capillary: 104 mg/dL — ABNORMAL HIGH (ref 65–99)

## 2016-07-29 NOTE — Progress Notes (Signed)
Patient ID: Ann PoeGloria Potter, female   DOB: May 16, 1941, 75 y.o.   MRN: [id number]      [address]       [name] [id number]             [phone]                 2 Days Post-Op Procedure(s) (LRB): Right Heart Cath (N/A)  LOS: 30 days   Subjective: Comfort care only, patient unresponsive   Objective: Vital signs in last 24 hours: Patient Vitals for the past 24 hrs:  BP Temp Temp src Pulse Resp SpO2  04-07-2016 0600 - - - 83 (!) 8 (!) 67 %  04-07-2016 0400 (!) 74/40 - - 85 11 (!) 59 %  04-07-2016 0100 - - - (!) 55 (!) 8 96 %  04-07-2016 0000 (!) 76/35 - - (!) 59 10 97 %  07/24/16 2200 (!) 90/39 - - 66 17 100 %  07/24/16 2100 (!) 79/36 - - 69 15 100 %  07/24/16 2000 (!) 53/22 - - 68 15 98 %  07/24/16 1921 - - - - - 98 %  07/24/16 1900 (!) 115/32 - - 70 20 95 %  07/24/16 1700 (!) 83/42 - - 75 16 98 %  07/24/16 1628 (!) 88/70 97 F (36.1 C) Oral 73 16 99 %  07/24/16 1600 (!) 88/40 - - 76 15 97 %  07/24/16 1500 (!) 95/58 - - 69 14 97 %  07/24/16 1400 (!) 85/41 - - 71 16 98 %  07/24/16 1323 - - - - - 98 %  07/24/16 1300 (!) 76/38 - - 64 17 98 %  07/24/16 1205 96/63 (!) 96.3 F (35.7 C) Axillary 70 16 96 %  07/24/16 1200 96/63 - - 70 17 99 %  07/24/16 1100 (!) 100/48 - - 63 16 99 %  07/24/16 1000 (!) 85/75 - - 62 16 99 %  07/24/16 0900 (!) 95/56 - - 72 14 99 %  07/24/16 0857 (!) 95/56 - - 70 18 98 %  07/24/16 0800 105/79 - - 65 (!) 22 94 %    Filed Weights   07/22/16 0500 07/27/2016 0600 07/24/16 0500  Weight: 253 lb 12 oz (115.1 kg) 255 lb 8.2 oz (115.9 kg) 253 lb 8.5 oz (115 kg)    Hemodynamic parameters for last 24 hours: CVP:  [19 mmHg-20 mmHg] 19 mmHg  Intake/Output from previous day: 10/27 0701 - 10/28 0700 In: 149.5 [I.V.:149.5] Out: 432 [Urine:432] Intake/Output this shift: No intake/output data recorded.  Scheduled Meds: . feeding supplement  1 Container Oral TID BM  . feeding supplement (PRO-STAT SUGAR FREE 64)  30 mL Per Tube Daily  .  ipratropium  0.5 mg Nebulization TID  . levalbuterol  1.25 mg Nebulization TID  . mouth rinse  15 mL Mouth Rinse Q12H  . nystatin ointment   Topical BID   Continuous Infusions: . sodium chloride Stopped (07/14/2016 0400)  . feeding supplement (JEVITY 1.2 CAL)    . morphine 5 mg/hr (04-07-2016 0600)   PRN Meds:.acetaminophen, Shanell Aden's butt cream, ipratropium-albuterol, ondansetron (ZOFRAN) IV, sodium chloride, sodium chloride flush, sodium chloride flush    Lab Results: CBC: Recent Labs  07/27/2016 0359 07/24/16 0422  WBC 9.5 10.6*  HGB 7.9* 8.1*  HCT 25.4* 25.8*  PLT 212 199   BMET:  Recent Labs  07/24/16 0422 07/24/16 1716  NA 135 132*  K 3.4* 2.9*  CL 96* 94*  CO2 25  24  GLUCOSE 120* 275*  BUN 99* 97*  CREATININE 2.55* 2.40*  CALCIUM 9.3 8.9    PT/INR:  Recent Labs  07/24/16 0422  LABPROT 21.8*  INR 1.88     Radiology US Renal  Result Date: 07/10/2016 CLINICAL DATA:  Acute renal failure EXAM: RENAL / URINARY TRACT ULTRASOUND COMPLETE COMPARISON:  05/28/2016 CT scan FINDINGS: Right Kidney: Length: 11.2 cm. Echogenicity within normal limits. No mass or hydronephrosis visualized. Left Kidney: Length: 10.0 cm. Echogenicity within normal limits. Left kidney mid upper pole cyst approximately 2.5 cm diameter. Bladder: Not well seen; the bladder is collapsed down around a Foley catheter. Other: Reduced sensitivity due to body habitus and surgical bandaging complicating obtaining sonic windows. IMPRESSION: 1. No hydronephrosis is identified. Urinary bladder is collapsed but otherwise difficult to characterize. 2. Left mid upper renal cyst. Electronically Signed   By: Gaylyn Rong M.D.   On: 07/15/2016 16:55   Dg Chest Port 1 View  Result Date: 07/24/2016 CLINICAL DATA:  Pleural effusions and shortness of breath. EXAM: PORTABLE CHEST 1 VIEW COMPARISON:  07/22/2016 FINDINGS: Status post median sternotomy and valve replacement. A left atrial appendage clip is present.  Right-sided PICC line tip overlies the superior vena cava. Bilateral pleural drainage catheters are present. No pneumothorax. The heart is enlarged. There are bilateral pleural effusions. Interstitial edema persists. Bibasilar atelectasis is noted. IMPRESSION: 1. Cardiomegaly and mild edema. 2. Bibasilar atelectasis. Electronically Signed   By: Norva Pavlov M.D.   On: 07/24/2016 08:36     Assessment/Plan: S/P Procedure(s) (LRB): Right Heart Cath (N/A) Patient unresponsive currently, death seems imminent  Made comfort care only last pm after discussion between Dr Cornelius Moras and family Patients family notified    Ann Ovens MD 07/09/2016 7:13 AM

## 2016-07-29 DEATH — deceased

## 2016-08-04 LAB — FUNGUS CULTURE RESULT

## 2016-08-04 LAB — FUNGUS CULTURE WITH STAIN

## 2016-08-04 LAB — FUNGAL ORGANISM REFLEX

## 2016-08-20 LAB — ACID FAST CULTURE WITH REFLEXED SENSITIVITIES (MYCOBACTERIA)
Acid Fast Culture: NEGATIVE
Acid Fast Culture: NEGATIVE

## 2016-08-28 NOTE — Discharge Summary (Signed)
[address]       [place] [id number]             [phone]      Physician Discharge Summary  Patient ID: Ann Potter MRN: [id number] DOB/AGE: 76-Oct-1942 76 y.o.  Admit date: 05/29/2016 Discharge date: date of death 06/28/2016  DEATH SUMMARY  Admission Diagnoses:Severe mitral and tricuspid regurgitation  Discharge Diagnoses:  Principal Problem:   S/P mitral valve replacement with bioprosthetic valve + tricuspid valve repair + maze procedure Active Problems:   Atrial fibrillation, chronic (HCC)   Mitral stenosis   Pulmonary hypertension (HCC)   Acute on chronic diastolic CHF (congestive heart failure) (HCC)   HTN (hypertension)   Chronic renal insufficiency, stage III (moderate)   Valvular heart disease   Chronic atrial fibrillation (HCC)   Morbid obesity (HCC)   Tricuspid regurgitation   Rheumatic heart disease   S/P tricuspid valve repair   S/P Maze operation for atrial fibrillation   Atelectasis   HCAP (healthcare-associated pneumonia)   Acute on chronic respiratory failure (HCC)   Chronic respiratory failure (HCC)   Right heart failure  HPI: At time of admission  Patient is a 75 year old morbidly obese female with history of rheumatic fever during childhood, long-standing rheumatic heart disease with mitral stenosis, chronic persistent atrial fibrillation on long-term anticoagulation, chronic diastolic congestive heart failure, hypertension, pulmonary hypertension, tricuspid regurgitation, stage III chronic kidney disease, and venous insufficiency with severe chronic lower extremity edema who has been referred for surgical consultation to discuss treatment options for management of rheumatic mitral stenosis and tricuspid regurgitation. The patient states that she had rheumatic fever when she was a child and was ill for several months. She has been diagnosed with a heart murmur most of her life. She has been diagnosed with rheumatic mitral  stenosis and chronic persistent atrial fibrillation for many years. She was followed for a while by Dr. Andee LinemaneGent but then lost to follow-up. She moved to [place] to live with her son for a while but was not seen by a cardiologist. More recently she returned to this area to be near her daughter for the last couple of years she has been followed by Dr. Wyline MoodBranch. The patient states that she has been doing better over the past year or so since Dr. Wyline MoodBranch began to manage her long-term medical therapy. However, management of the patient's chronic diastolic congestive heart failure has been challenging. Previous transesophageal echocardiogram performed 06/26/2015 revealed rheumatic mitral valve disease with at least moderate mitral stenosis. Mean transvalvular gradient was estimated 8 mmHg corresponding to mitral valve area estimated 1.3 cm by pressure half-time. Follow-up transthoracic echocardiogram performed 03/19/2016 revealed normal left ventricular size and systolic function. There was rheumatic mitral stenosis with severe thickening and restricted leaflet mobility involving both leaflets of the mitral valve. There was mild calcification of the leaflets with significant annular calcification. There was moderate to severe mitral stenosis with mean transvalvular gradient estimated 9 mmHg corresponding to valve area estimated 1.02 cm. The patient subsequently underwent left and right heart catheterization by Dr. Excell Seltzerooper on 04/16/2016. She was found to have mild nonobstructive coronary artery disease. There was moderate pulmonary hypertension with PA pressures measured 62/30 and mean pulmonary artery pressure 45 mmHg. Central venous pressure was 12. There was at least moderate mitral stenosis with peak to peak gradient 12 mmHg, mean transvalvular gradient 14.8 mmHg, and mitral valve area calculated 1.39 cm. The patient was referred for surgical consultation.  The patient  is single and lives alone in a assisted  living facility [place]. Her daughter lives nearby and is supportive. The patient lives a sedentary lifestyle and has not been very active physically for more than 10 years. Her physical mobility and activity are limited by exertional shortness of breath,chronic pain and swelling in both legs and feet, and poor eyesight. She walks using a walker when she has to travel any significant distance. She gets short of breath with moderate activity. She cannot lay flat in bed. She is comfortable breathing at rest. She has never had any chest pain or chest tightness with activity. She has chronic lower extremity edema. She has had dizzy spells without syncope. She has palpitations and known history of atrial fibrillation. She is very hard of hearing and has poor eyesight. The patient has been anticoagulated using warfarin in the past but is currently anticoagulated using Xarelto because of difficulty maintaining therapeutic anticoagulation on warfarin.   Discharged Condition: deceased  Hospital Course: The patient was admitted electively and taken to the operating room on 06/06/2016 where she underwent the below described procedure. The patient tolerated procedure well and was taken to the surgical intensive care unit in stable condition.  Post operative hospital course:  Initially the patient did well. She was a little slow to wean from the vent due to poor respiratory mechanics but this was able to be done using standard protocols. She did have some evidence of congestive failure and volume overload and was given a course of diuretics as well as dopamine. She had initially some junctional rhythm requiring pacer. As she was felt to require some intensive management a PICC line was placed and the older central line was discontinued. From very early on in the postoperative course she had significant limited mobility requiring significant assistance . She did make some good overall early progress but  remains significantly limited due to volume overload and this poor mobility status. She began to develop some increasing pulmonary difficulties due to secretions. She also had some pleural effusions. She developed some collapse in her lung was due to significant atelectasis as well as. She required bronchoscopy to assist with management. This did initially lead to some improvement in her overall respiratory status. Antibiotics were added to assist with presumed respiratory infectious component (HCAP) specifically vancomycin and Zosyn. Pulmonology was also consult to assist with management. Bronchoscopy was repeated on 07/06/2016 for complete left lung collapse. Bronchoscopy was again done on 06/28/2016 under general anesthesia in the operating room. She was placed back on the ventilator at this time. She required transfusion for anemia of blood loss as well as chronic disease. She developed bilateral increasing pleural effusions and Pleurx catheters were placed. She did have postoperative atrial fibrillation as well. She was eventually able to be extubated. She did however develop overall worsening renal failure and evidence of right ventricular failure. Advanced heart failure team was consult to assist with management. She had remarkably poor deconditioning that continued to be progressive in nature. A right heart catheterization was done on 07/24/2016 where she was found to have markedly elevated right and left heart filling pressures with pulmonary venous hypertension. She did have a normal cardiac output on dobutamine. It was felt that in order to manage the dramatic fluid overload CVVH would be required. This was started with the management of nephrology however she quickly had a significant continued deterioration in her overall condition and was started on a course of palliative care. She was kept comfortable  until she passed away on 07/24/2016.  Consults: cardiology, pulmonary/intensive care and  nephrology  Significant Diagnostic Studies: angiography: right heart cath  Treatments: surgery:  CARDIOTHORACIC SURGERY OPERATIVE NOTE  Date of Procedure:                06/16/2016  Preoperative Diagnosis:        Rheumatic Heart Valve Disease  Severe Mitral Stenosis  Mild Mitral Regurgitation  Severe Tricuspid Regurgitation  Chronic Persistent Atrial Fibrillation  Postoperative Diagnosis:    Same  Procedure:       Mitral Valve Replacement              Edwards Magna Mitral Bovine Bioprosthetic Tissue Valve (size 29mm, model #7300TFX, serial #[id number])   Tricuspid Valve Repair             Edwards mc3 ring annuloplasty (size 26mm, model #4900, serial #[id number])   Maze Procedure              complete bilateral atrial atrial lesion set using bipolar radiofrequency and cryothermy ablation             clipping of left atrial appendage (Atriclip size 40mm)               Surgeon:        Salvatore Decent. Cornelius Moras, MD  Assistant:       Rowe Clack, PA-C  Anesthesia:    Cecile Hearing, MD  Operative Findings:  Rheumatic mitral valve disease  Severe mitral stenosis  Type IIIA mitral valve dysfunction with mild mitral regurgitation  Normal LV systolic function  Type I tricuspid valve dysfunction with severe tricuspid regurgitation  Severe pulmonary hypertension  Mild RV systolic dysfunction Trivial tricuspid regurgitation after successful valve repairDischarge Exam: Blood pressure (!) 74/40, pulse 61, temperature 97 F (36.1 C), temperature source Oral, resp. rate 20, height 5\' 1"  (1.549 m), weight 253 lb 8.5 oz (115 kg), SpO2 (!) 76 %.   Disposition: 20-Expired   Signed: GOLD,WAYNE E 07/29/2016, 1:20 PM

## 2016-12-04 IMAGING — CR DG CHEST 1V PORT
1 series · 1 of 1 positions shown · non-contrast
Comparison: 07/04/2016

CLINICAL DATA: Postop check

EXAM:
PORTABLE CHEST 1 VIEW

[AP]
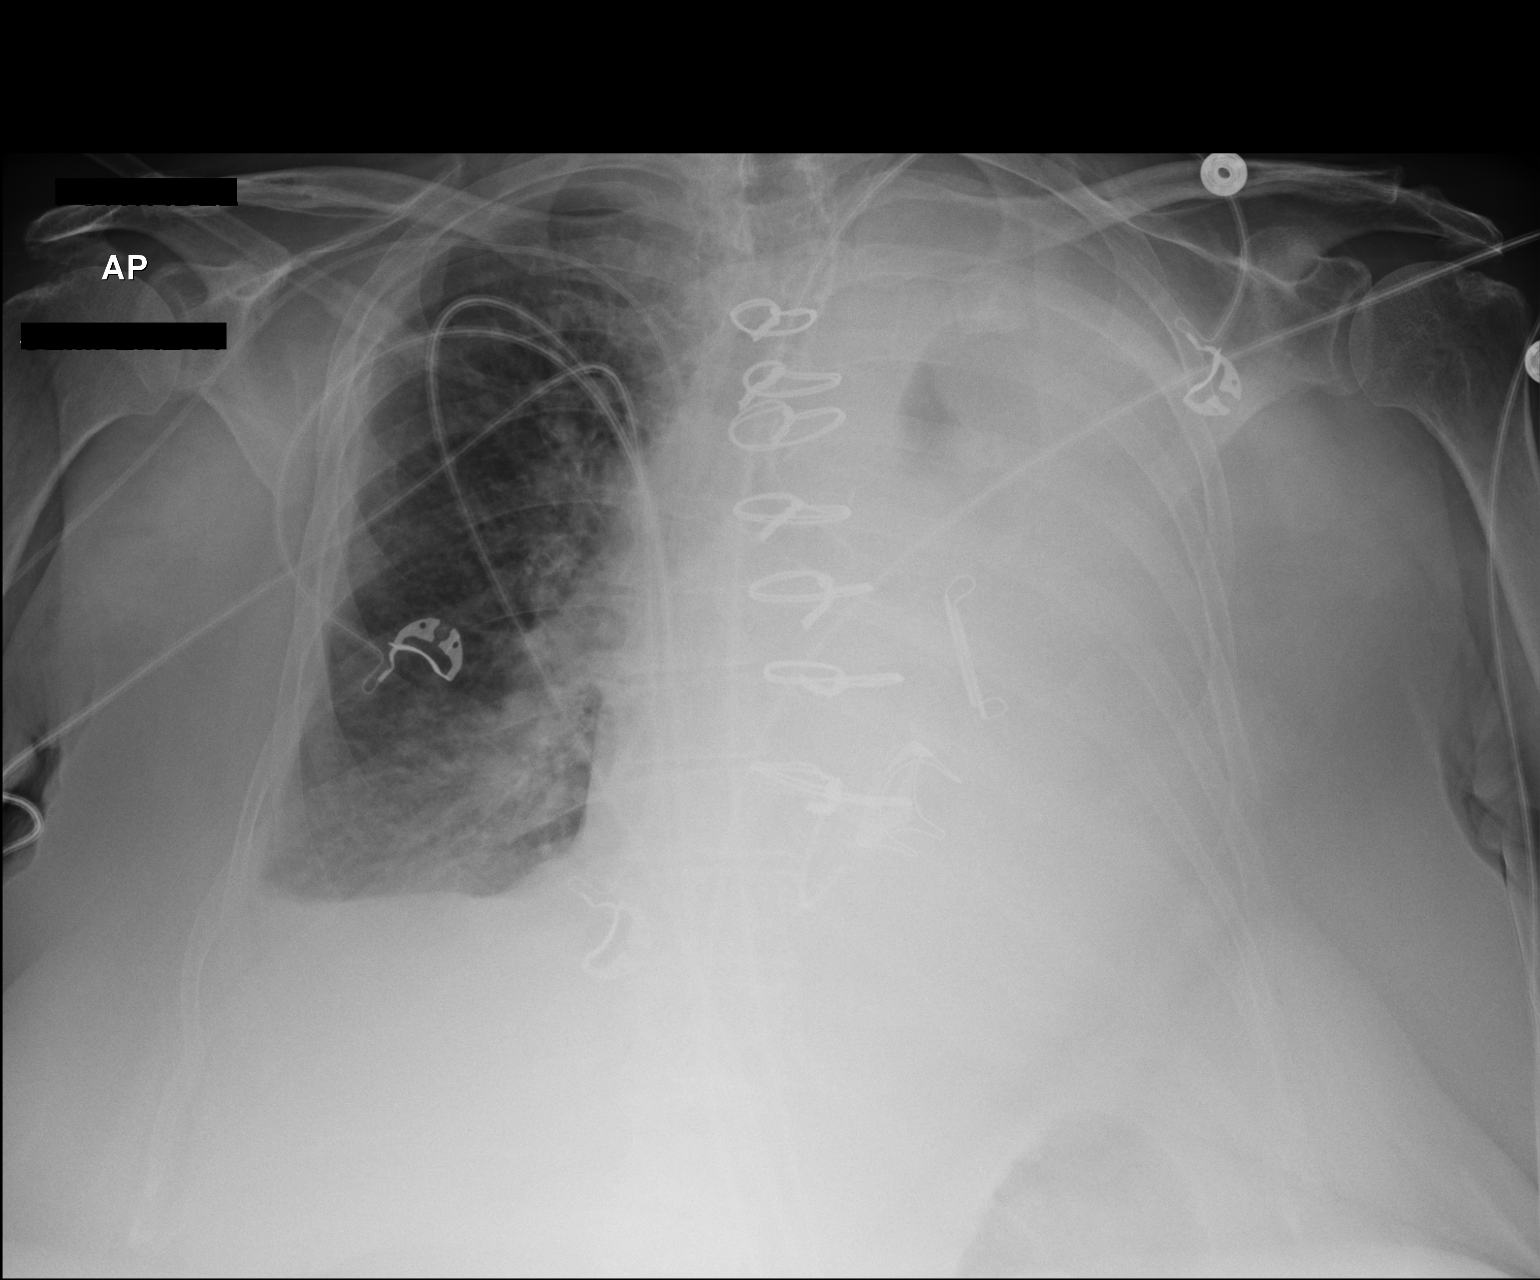

[1 of 1 positions shown; findings below may reference images not displayed]

FINDINGS: Postoperative changes in the mediastinum. Sternotomy wires are
present. Cardiac valve prostheses. Persistent diffuse white out of
the left lung likely representing a combination of large effusion,
infiltration, and atelectasis. Small right pleural effusion with
basilar infiltration or atelectasis. No pneumothorax. Right PICC
line with tip over the low SVC region. No significant changes since
previous study.
IMPRESSION: Persistent opacification of the left hemi thorax consistent with
pleural effusion, atelectasis, and/or infiltration. Small right
pleural effusion with basilar infiltration or atelectasis. Stable
appearance since previous study.

## 2016-12-04 IMAGING — CR DG CHEST 1V PORT
1 series · 1 of 1 positions shown · non-contrast
Comparison: July 03, 2016

CLINICAL DATA: Shortness of Breath

EXAM:
PORTABLE CHEST 1 VIEW

[AP]
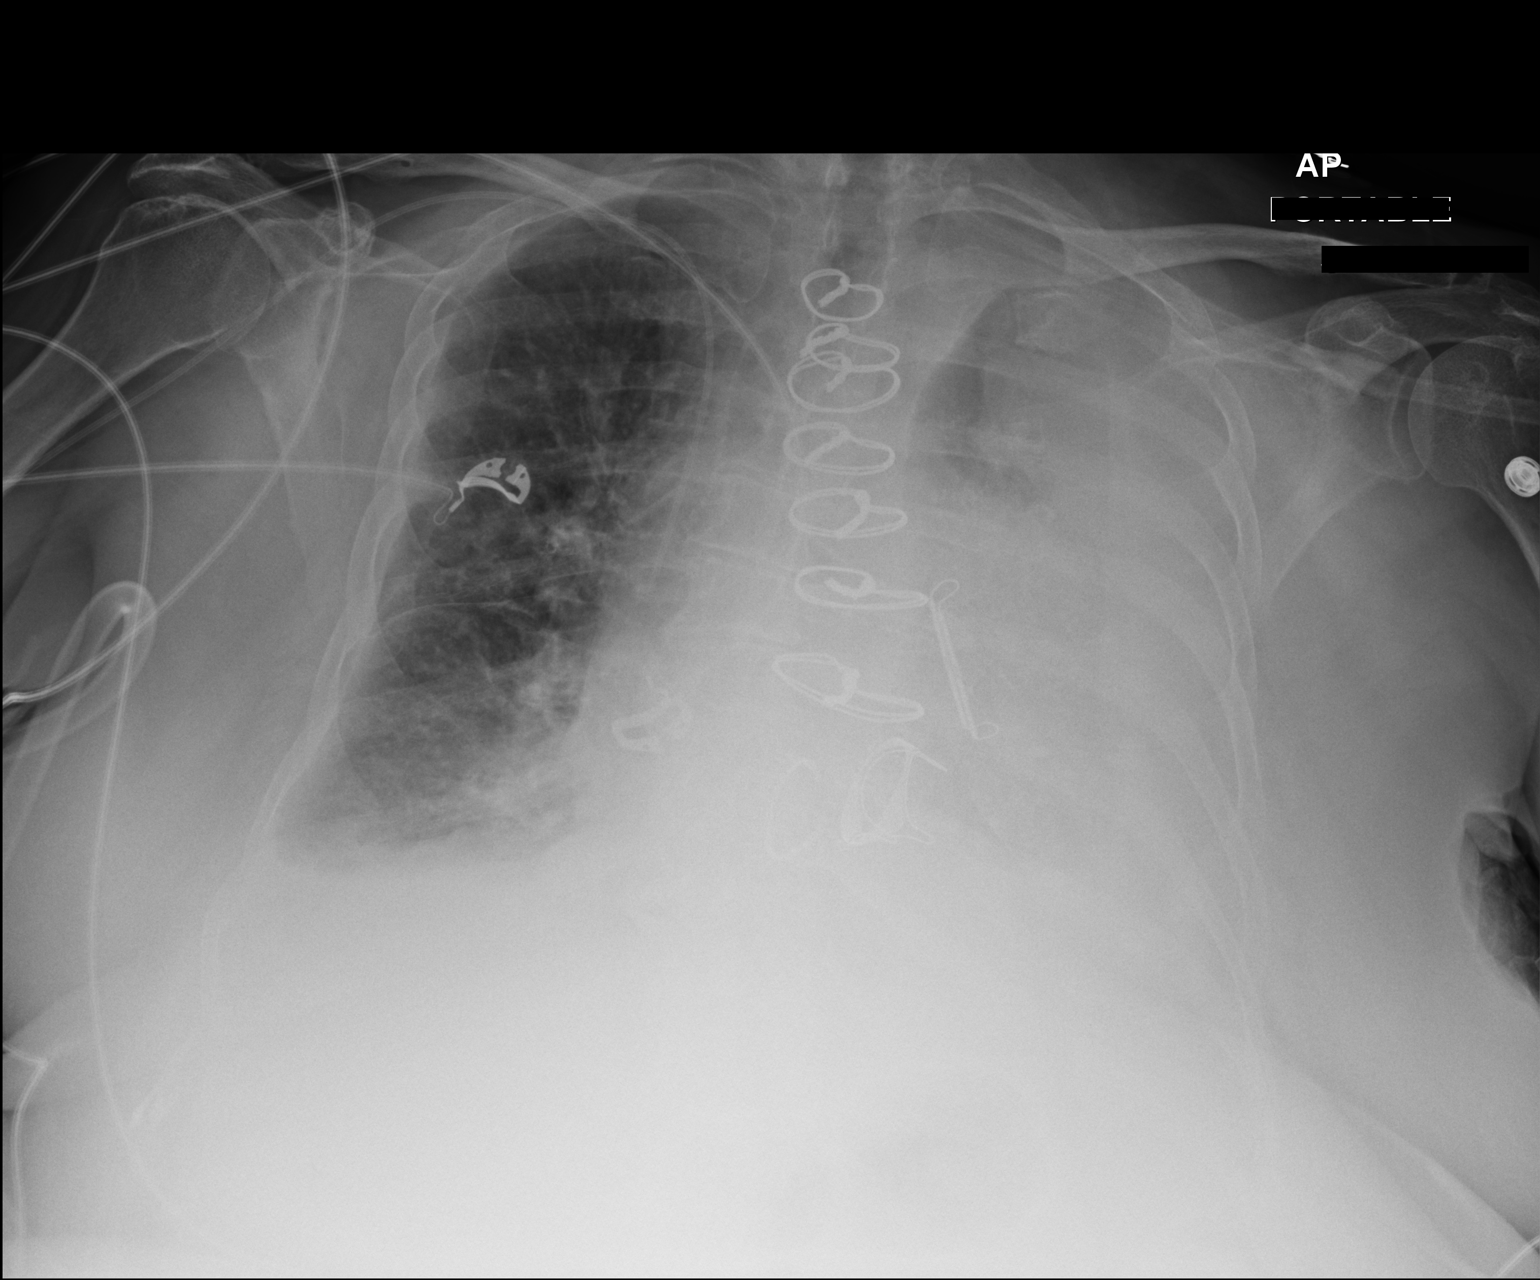

[1 of 1 positions shown; findings below may reference images not displayed]

FINDINGS: There is effusion and airspace consolidation causing near complete
opacification of the left hemidiaphragm. There is a small right
pleural effusion with right base atelectasis. Right lung otherwise
is clear. Heart is enlarged but stable. The pulmonary vascularity on
the right shows mild pulmonary venous hypertension. Pulmonary
vascularity on the left is obscured. There is evidence of all mitral
and tricuspid valve replacements. There is a left atrial appendage
clamp present. Central catheter tip is in the superior vena cava. No
pneumothorax.
IMPRESSION: Persistent opacification of essentially all of the left hemi thorax,
presumably due to a combination of airspace consolidation and
effusion. Small right pleural effusion with mild right base
atelectasis. Stable cardiomegaly. Central catheter tip in superior
vena cava. No pneumothorax. The appearance is stable compared to 1
day prior.

## 2016-12-07 IMAGING — CR DG CHEST 1V PORT
1 series · 1 of 1 positions shown · non-contrast
Comparison: Portable chest x-ray of today's date at [DATE] a.m..

CLINICAL DATA: Endotracheal tube placed after bronchoscopy today.

EXAM:
PORTABLE CHEST 1 VIEW

[AP]
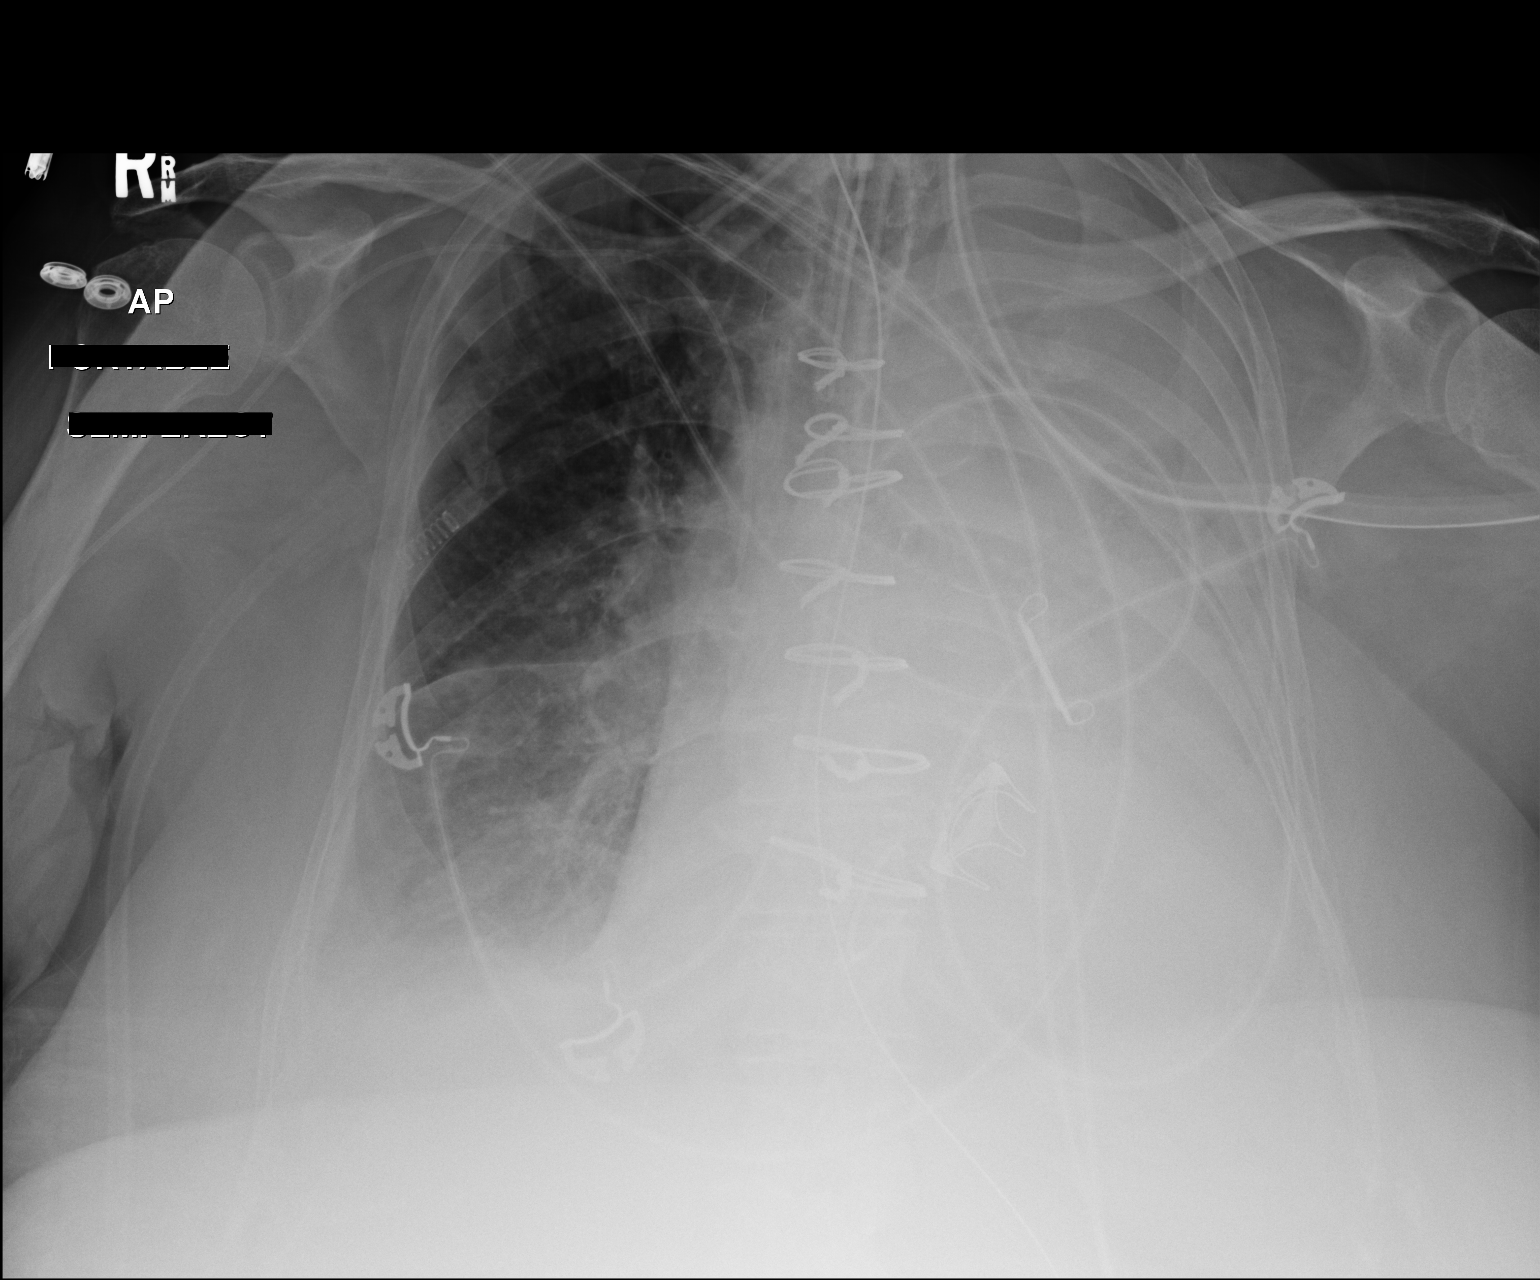

[1 of 1 positions shown; findings below may reference images not displayed]

FINDINGS: Near-total opacification of the left hemi thorax persists. The right
lung is reasonably well inflated. The interstitial markings are less
conspicuous since intubation. There is no significant mediastinal
shift. The endotracheal tube tip lies 3.8 cm above the carina. The
esophagogastric tube tip projects below the inferior margin of the
image. The cardiac silhouette is largely obscured. A prosthetic
mitral as well as aortic valve ring are observed. There is a left
atrial appendage clip. There is a PICC line in place whose tip
projects over the junction of the proximal and midportions of the
SVC.
IMPRESSION: Interval intubation of the trachea with appropriate positioning of
the endotracheal tube tip. Improved aeration of the right lung.
Persistent coarse right basilar lung markings with small right
pleural effusion. Stable near-total opacification of the left hemi
thorax.

## 2016-12-09 IMAGING — CR DG CHEST 1V PORT
1 series · 1 of 1 positions shown · non-contrast
Comparison: Chest radiograph and chest CT July 08, 2016

CLINICAL DATA: Hypoxia

EXAM:
PORTABLE CHEST 1 VIEW

[AP]
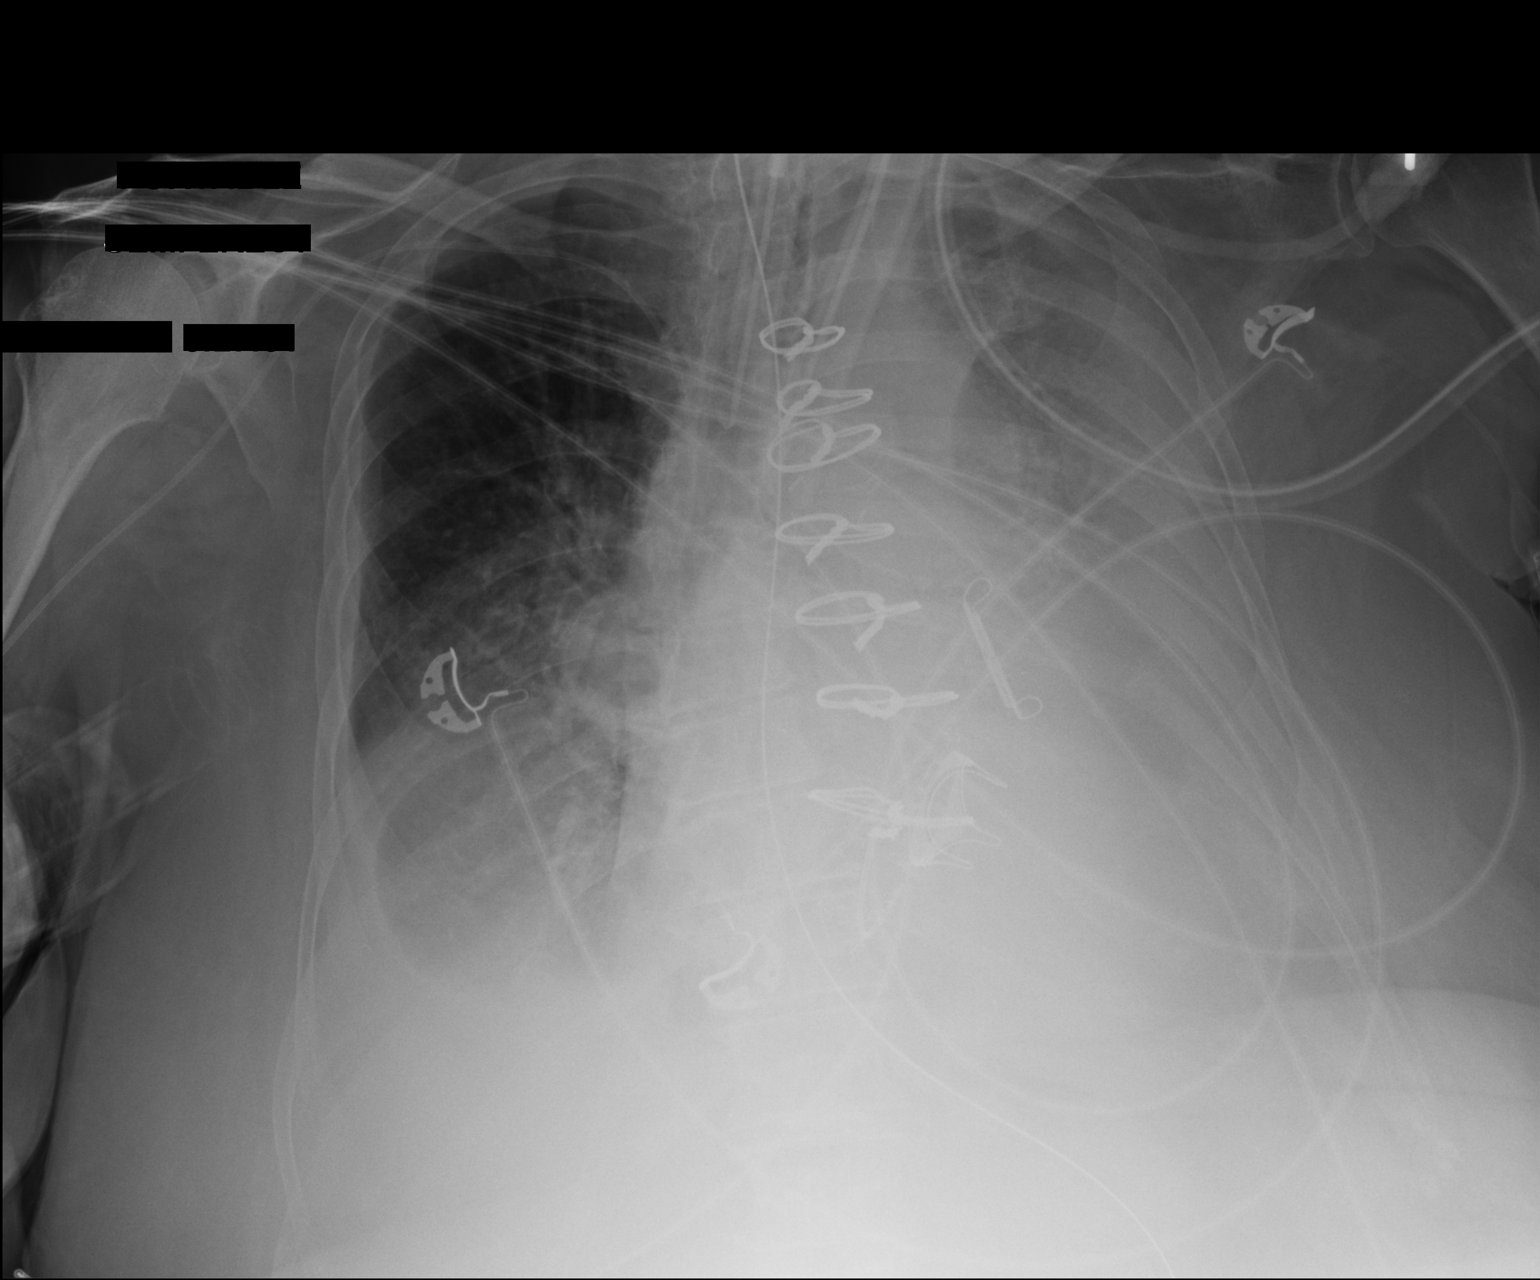

[1 of 1 positions shown; findings below may reference images not displayed]

FINDINGS: Endotracheal tube tip is 2.8 cm above the carina. Nasogastric tube
tip and side port are below the diaphragm. Central catheter tip is
in superior vena cava. No pneumothorax. There are pleural effusions
bilaterally, larger on the left than on the right. There is
cardiomegaly with pulmonary venous hypertension. Patient is status
post mitral valve replacement with left atrial appendage clamp also
noted. No adenopathy evident.
IMPRESSION: Tube and catheter positions as described without pneumothorax.
Sizable pleural effusions bilaterally, left larger than right. Left
effusion may be slightly larger compared to 1 day prior. Stable
cardiac silhouette.

## 2016-12-10 IMAGING — CR DG CHEST 1V PORT
1 series · 1 of 1 positions shown · non-contrast
Comparison: Chest radiograph from one day prior.

CLINICAL DATA: Atelectasis.  Intubated.

EXAM:
PORTABLE CHEST 1 VIEW

[AP]
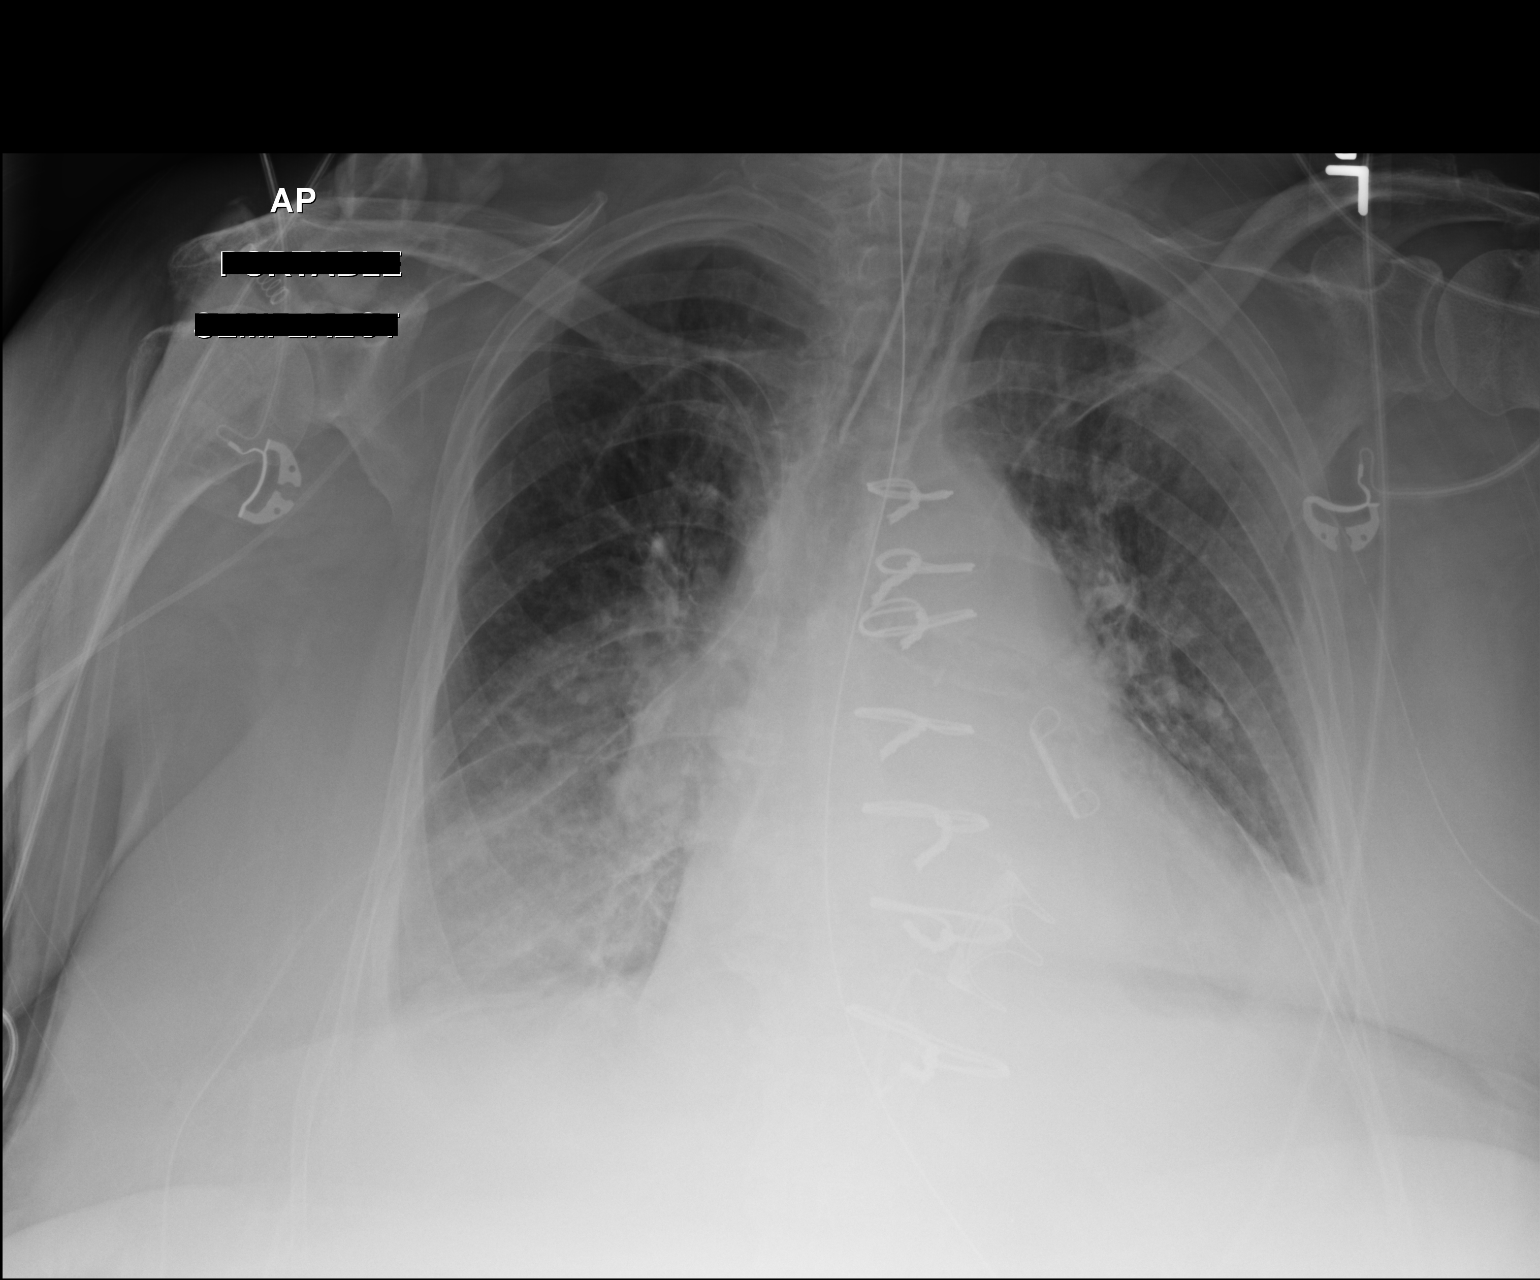

[1 of 1 positions shown; findings below may reference images not displayed]

FINDINGS: Endotracheal tube tip is 3.7 cm above the carina. Enteric tube
enters stomach with the tip not seen on this image. Right PICC
terminates in the low superior vena cava with the tip not well seen
on this view. Biapical chest tubes are stable in position. Cardiac
valvular prostheses are stable in position. Median sternotomy wires
are aligned and intact. Stable cardiomediastinal silhouette with
mild cardiomegaly. No pneumothorax. Stable small bilateral pleural
effusions. Stable mild pulmonary edema and bibasilar atelectasis.
IMPRESSION: 1. Support structures as described.  No pneumothorax.
2. Stable mild congestive heart failure.
3. Stable small bilateral pleural effusions and bibasilar
atelectasis.
